# Patient Record
Sex: Male | Born: 1940
Health system: Southern US, Community
[De-identification: ages and names within clinical notes are randomized; demographics above are authoritative.]

## PROBLEM LIST (undated history)

## (undated) DIAGNOSIS — E785 Hyperlipidemia, unspecified: Secondary | ICD-10-CM

## (undated) DIAGNOSIS — M7989 Other specified soft tissue disorders: Secondary | ICD-10-CM

## (undated) DIAGNOSIS — I499 Cardiac arrhythmia, unspecified: Secondary | ICD-10-CM

## (undated) DIAGNOSIS — M1990 Unspecified osteoarthritis, unspecified site: Secondary | ICD-10-CM

## (undated) DIAGNOSIS — R21 Rash and other nonspecific skin eruption: Secondary | ICD-10-CM

## (undated) DIAGNOSIS — T8859XA Other complications of anesthesia, initial encounter: Secondary | ICD-10-CM

## (undated) DIAGNOSIS — I10 Essential (primary) hypertension: Secondary | ICD-10-CM

## (undated) DIAGNOSIS — E669 Obesity, unspecified: Secondary | ICD-10-CM

## (undated) DIAGNOSIS — T4145XA Adverse effect of unspecified anesthetic, initial encounter: Secondary | ICD-10-CM

## (undated) DIAGNOSIS — I2510 Atherosclerotic heart disease of native coronary artery without angina pectoris: Secondary | ICD-10-CM

## (undated) DIAGNOSIS — B029 Zoster without complications: Secondary | ICD-10-CM

## (undated) DIAGNOSIS — G8929 Other chronic pain: Secondary | ICD-10-CM

## (undated) DIAGNOSIS — K219 Gastro-esophageal reflux disease without esophagitis: Secondary | ICD-10-CM

## (undated) DIAGNOSIS — R569 Unspecified convulsions: Secondary | ICD-10-CM

## (undated) DIAGNOSIS — R0602 Shortness of breath: Secondary | ICD-10-CM

## (undated) HISTORY — DX: Gastro-esophageal reflux disease without esophagitis: K21.9

## (undated) HISTORY — DX: Rash and other nonspecific skin eruption: R21

## (undated) HISTORY — DX: Essential (primary) hypertension: I10

## (undated) HISTORY — PX: COLONOSCOPY: SHX174

## (undated) HISTORY — DX: Obesity, unspecified: E66.9

## (undated) HISTORY — DX: Zoster without complications: B02.9

## (undated) HISTORY — DX: Atherosclerotic heart disease of native coronary artery without angina pectoris: I25.10

## (undated) HISTORY — DX: Other specified soft tissue disorders: M79.89

## (undated) HISTORY — DX: Hyperlipidemia, unspecified: E78.5

## (undated) HISTORY — DX: Unspecified osteoarthritis, unspecified site: M19.90

## (undated) HISTORY — DX: Unspecified convulsions: R56.9

---

## 2002-02-14 ENCOUNTER — Ambulatory Visit (HOSPITAL_COMMUNITY): Admission: RE | Admit: 2002-02-14 | Discharge: 2002-02-14 | Payer: Self-pay | Admitting: Family Medicine

## 2004-03-04 ENCOUNTER — Ambulatory Visit (HOSPITAL_COMMUNITY): Admission: RE | Admit: 2004-03-04 | Discharge: 2004-03-04 | Payer: Self-pay | Admitting: General Surgery

## 2004-09-20 ENCOUNTER — Ambulatory Visit (HOSPITAL_COMMUNITY): Admission: RE | Admit: 2004-09-20 | Discharge: 2004-09-20 | Payer: Self-pay | Admitting: Cardiovascular Disease

## 2005-10-09 ENCOUNTER — Encounter: Payer: Self-pay | Admitting: Cardiovascular Disease

## 2005-11-25 ENCOUNTER — Inpatient Hospital Stay (HOSPITAL_COMMUNITY): Admission: RE | Admit: 2005-11-25 | Discharge: 2005-11-28 | Payer: Self-pay | Admitting: Specialist

## 2007-04-30 ENCOUNTER — Ambulatory Visit (HOSPITAL_COMMUNITY): Admission: RE | Admit: 2007-04-30 | Discharge: 2007-04-30 | Payer: Self-pay | Admitting: Family Medicine

## 2007-05-10 ENCOUNTER — Ambulatory Visit (HOSPITAL_COMMUNITY): Admission: RE | Admit: 2007-05-10 | Discharge: 2007-05-10 | Payer: Self-pay | Admitting: Family Medicine

## 2008-11-28 ENCOUNTER — Ambulatory Visit (HOSPITAL_COMMUNITY): Admission: RE | Admit: 2008-11-28 | Discharge: 2008-11-28 | Payer: Self-pay | Admitting: Family Medicine

## 2009-01-29 ENCOUNTER — Ambulatory Visit (HOSPITAL_COMMUNITY)
Admission: RE | Admit: 2009-01-29 | Discharge: 2009-01-29 | Payer: Self-pay | Admitting: Physical Medicine & Rehabilitation

## 2009-02-27 ENCOUNTER — Ambulatory Visit (HOSPITAL_COMMUNITY): Admission: RE | Admit: 2009-02-27 | Discharge: 2009-02-27 | Payer: Self-pay | Admitting: General Surgery

## 2009-10-22 ENCOUNTER — Ambulatory Visit (HOSPITAL_COMMUNITY): Admission: RE | Admit: 2009-10-22 | Discharge: 2009-10-22 | Payer: Self-pay | Admitting: Specialist

## 2009-11-23 ENCOUNTER — Encounter: Payer: Self-pay | Admitting: Cardiovascular Disease

## 2009-12-03 ENCOUNTER — Encounter: Payer: Self-pay | Admitting: Cardiovascular Disease

## 2010-02-08 ENCOUNTER — Ambulatory Visit: Payer: Self-pay | Admitting: Cardiovascular Disease

## 2010-02-08 ENCOUNTER — Encounter: Payer: Self-pay | Admitting: Cardiovascular Disease

## 2010-02-08 DIAGNOSIS — R0609 Other forms of dyspnea: Secondary | ICD-10-CM | POA: Insufficient documentation

## 2010-02-08 DIAGNOSIS — R9431 Abnormal electrocardiogram [ECG] [EKG]: Secondary | ICD-10-CM | POA: Insufficient documentation

## 2010-02-08 DIAGNOSIS — R0989 Other specified symptoms and signs involving the circulatory and respiratory systems: Secondary | ICD-10-CM

## 2010-02-11 ENCOUNTER — Telehealth (INDEPENDENT_AMBULATORY_CARE_PROVIDER_SITE_OTHER): Payer: Self-pay | Admitting: *Deleted

## 2010-02-27 ENCOUNTER — Telehealth (INDEPENDENT_AMBULATORY_CARE_PROVIDER_SITE_OTHER): Payer: Self-pay

## 2010-02-28 ENCOUNTER — Encounter: Payer: Self-pay | Admitting: Cardiology

## 2010-02-28 ENCOUNTER — Encounter: Payer: Self-pay | Admitting: Cardiovascular Disease

## 2010-02-28 ENCOUNTER — Encounter (HOSPITAL_COMMUNITY)
Admission: RE | Admit: 2010-02-28 | Discharge: 2010-05-14 | Payer: Self-pay | Source: Home / Self Care | Attending: Cardiovascular Disease | Admitting: Cardiovascular Disease

## 2010-02-28 ENCOUNTER — Ambulatory Visit (HOSPITAL_COMMUNITY): Admission: RE | Admit: 2010-02-28 | Discharge: 2010-02-28 | Payer: Self-pay | Admitting: Cardiovascular Disease

## 2010-02-28 ENCOUNTER — Ambulatory Visit: Payer: Self-pay | Admitting: Cardiology

## 2010-02-28 ENCOUNTER — Ambulatory Visit: Payer: Self-pay

## 2010-03-05 ENCOUNTER — Encounter: Payer: Self-pay | Admitting: Cardiovascular Disease

## 2010-03-11 ENCOUNTER — Ambulatory Visit (HOSPITAL_COMMUNITY): Admission: RE | Admit: 2010-03-11 | Discharge: 2010-03-11 | Payer: Self-pay | Admitting: Cardiovascular Disease

## 2010-03-11 ENCOUNTER — Encounter: Payer: Self-pay | Admitting: Cardiovascular Disease

## 2010-03-12 LAB — CONVERTED CEMR LAB
CO2: 27 meq/L (ref 19–32)
Calcium: 9.1 mg/dL (ref 8.4–10.5)
Creatinine, Ser: 0.91 mg/dL (ref 0.40–1.50)
Glucose, Bld: 103 mg/dL — ABNORMAL HIGH (ref 70–99)
HCT: 42.5 % (ref 39.0–52.0)
Hemoglobin: 13.7 g/dL (ref 13.0–17.0)
MCHC: 32.2 g/dL (ref 30.0–36.0)
MCV: 94.4 fL (ref 78.0–100.0)
RDW: 13.8 % (ref 11.5–15.5)

## 2010-03-14 ENCOUNTER — Ambulatory Visit: Payer: Self-pay | Admitting: Cardiovascular Disease

## 2010-03-14 ENCOUNTER — Ambulatory Visit: Admission: RE | Admit: 2010-03-14 | Payer: Self-pay | Source: Home / Self Care | Admitting: Cardiovascular Disease

## 2010-03-20 ENCOUNTER — Ambulatory Visit: Payer: Self-pay | Admitting: Cardiovascular Disease

## 2010-03-20 ENCOUNTER — Encounter: Payer: Self-pay | Admitting: Cardiovascular Disease

## 2010-03-20 DIAGNOSIS — I2510 Atherosclerotic heart disease of native coronary artery without angina pectoris: Secondary | ICD-10-CM | POA: Insufficient documentation

## 2010-05-05 ENCOUNTER — Encounter: Payer: Self-pay | Admitting: Specialist

## 2010-05-14 NOTE — Letter (Signed)
Summary: Office Notes ,Ekg, Cath Report, Myocardial Perfusion 2006-2007  Office Notes ,Ekg, Cath Report, Myocardial Perfusion 2006-2007   Imported By: Roderic Ovens 03/15/2010 10:50:09  _____________________________________________________________________  External Attachment:    Type:   Image     Comment:   External Document

## 2010-05-14 NOTE — Progress Notes (Signed)
Summary: Nuc. Pre-Procedure  Phone Note Outgoing Call Call back at [facility] Phone [phone]   Call placed by: Irean Hong, RN,  February 27, 2010 1:59 PM Summary of Call: Reviewed information on Myoview Information Sheet (see scanned document for further details).  Spoke with patient's wife.     Nuclear Med Background Indications for Stress Test: Evaluation for Ischemia, Abnormal EKG   History: GXT, Heart Catheterization  History Comments: '03 GXT: Neg. 6 years ago Cath: NL.  Symptoms: DOE    Nuclear Pre-Procedure Cardiac Risk Factors: Family History - CAD, Hypertension, Lipids, Obesity Height (in): 71

## 2010-05-14 NOTE — Assessment & Plan Note (Signed)
Summary: Steven [name]   Visit Type:  Follow-up Primary Malone Vanblarcom:  Dr Gerda Diss  CC:  Post Cath; No acute complaints.  History of Present Illness: 70 year-old gentleman presents for followup of CAD. He recently underwent cath showing moderate LAD stenosis and severe right PLA stenosis (small - moderate branch). He has no angina. The patient has no new complaints today. He has problems with his right knee 4 years after replacement and reportedly may need a revision.  Current Medications (verified): 1)  Pravastatin Sodium 40 Mg Tabs (Pravastatin Sodium) .... Take One Tablet By Mouth Daily At Bedtime 2)  Lisinopril-Hydrochlorothiazide 10-12.5 Mg Tabs (Lisinopril-Hydrochlorothiazide) .... Take 1 Tablet By Mouth Once A Day 3)  Omeprazole 20 Mg Tbec (Omeprazole) .... Take 1 Tablet By Mouth Once A Day 4)  Morphine Sulfate 15 Mg Tabs (Morphine Sulfate) .... As Needed 5)  Fish Oil 1000 Mg Caps (Omega-3 Fatty Acids) .... Take 1 Capsule By Mouth Two Times A Day 6)  Vitamin D 1000 Unit Tabs (Cholecalciferol) .... Take 1 Tablet By Mouth Once A Day 7)  Zyrtec Hives Relief 10 Mg Tabs (Cetirizine Hcl) .... Take 1 Tablet By Mouth Once A Day 8)  Aspirin 81 Mg Tbec (Aspirin) .... Take One Tablet By Mouth Daily 9)  Gabapentin 100 Mg Caps (Gabapentin) .... Take 1 By Mouth Two Times A Day  Allergies: 1)  ! Percocet  Past History:  Past medical history reviewed for relevance to current acute and chronic problems.  Past Medical History: HTN Obesity Dyslipidemia Osteoarthritis CAD - cath 2011  Review of Systems       Negative except as per HPI   Vital Signs:  Patient profile:   70 year old male Height:      70 inches Weight:      293.50 pounds BMI:     42.27 Pulse rate:   74 / minute Pulse rhythm:   regular Resp:     16 per minute BP sitting:   108 / 90  (left arm)  Vitals Entered By: Stanton Kidney, EMT-P (March 20, 2010 1:39 PM)  Physical Exam  General:  obese male, NAD,  pleasant   EKG  Procedure date:  03/20/2010  Findings:      NSR 74 bpm, within normal limits  Impression & Recommendations:  Problem # 1:  CORONARY ATHEROSCLEROSIS NATIVE CORONARY ARTERY (ICD-414.01) I reviewed the patient's catheterization films and showed him and his wife the findings. His stress test results were again reviewed in the context of his coronary anatomy. With an absence of angina, I would favor ongoing medical therapy. His PLA branch could be stented, but it does not supply a large area of myocardium. He also has the potential of knee surgery within the year, and he is probably better off without stenting before surgery. I would like to see him back in one year for followup. We had a long discussion regarding risk modification.   His lipids are followed by Dr Gerda Diss, and by the patient's report they have been well-controlled. I would consider changing to a more potent statin since his CAD has clearly progressed over the past 6 years.  His updated medication list for this problem includes:    Lisinopril-hydrochlorothiazide 10-12.5 Mg Tabs (Lisinopril-hydrochlorothiazide) .Marland Kitchen... Take 1 tablet by mouth once a day    Aspirin 81 Mg Tbec (Aspirin) .Marland Kitchen... Take one tablet by mouth daily  Orders: EKG w/ Interpretation (93000)  Patient Instructions: 1)  Your physician recommends that you continue on your current  medications as directed. Please refer to the Current Medication list given to you today. 2)  Your physician wants you to follow-up in:  1 YEAR.  You will receive a reminder letter in the mail two months in advance. If you don't receive a letter, please call our office to schedule the follow-up appointment.

## 2010-05-14 NOTE — Letter (Signed)
Summary: Cardiac Catheterization Instructions- [facility]  [employer], Main Office  [address]   [place], [place] [address]   Phone: [phone]  Fax: [phone]     03/05/2010 MRN: [id number]  Steven [name] [address] [place], [place]  [address]  Dear Mr. Steven [name], KARASIK are scheduled for a Cardiac Catheterization on Thursday March 14, 2010 with Dr. Excell Seltzer.  Please arrive to the 1st floor of the Heart and Vascular Center at [facility] at 7:30 am on the day of your procedure. Please do not arrive before 6:30 a.m. Call the Heart and Vascular Center at [phone] if you are unable to make your appointmnet. The Code to get into the parking garage under the building is 0003. Take the elevators to the 1st floor. You must have someone to drive you home. Someone must be with you for the first 24 hours after you arrive home. Please wear clothes that are easy to get on and off and wear slip-on shoes. Do not eat or drink after midnight except water with your medications that morning. Bring all your medications and current insurance cards with you.  _X__ Make sure you take your aspirin.  _X__ You may take ALL of your medications with water that morning.   Please come into the office for labwork and chest x-ray.    The usual length of stay after your procedure is 2 to 3 hours. This can vary.  If you have any questions, please call the office at the number listed above.   Julieta Gutting, RN, BSN

## 2010-05-14 NOTE — Assessment & Plan Note (Signed)
Summary: Cardiology Nuclear Testing  Nuclear Med Background Indications for Stress Test: Evaluation for Ischemia, Surgical Clearance, Abnormal EKG  Indications Comments: Pending (R) TKR by Dr. Ollen Gross  History: GXT, Heart Catheterization, Myocardial Perfusion Study  History Comments: '03 WJX:BJYNWG;  ~5 yrs ago MPS:OK per patient;  ~6 yrs ago Cath:normal  Symptoms: Chest Tightness, DOE, Fatigue  Symptoms Comments: Chest tightness with spicy food. Last episode of CP:2-3 weeks ago.   Nuclear Pre-Procedure Cardiac Risk Factors: Family History - CAD, History of Smoking, Hypertension, Lipids, Obesity Caffeine/Decaff Intake: none NPO After: 3:00 AM Lungs: Clear.  O2 Sat 98% on RA. IV 0.9% NS with Angio Cath: 18g     IV Site: R Antecubital IV Started by: Stanton Kidney, EMT-P Chest Size (in) 50     Height (in): 71 Weight (lb): 295 BMI: 41.29  Nuclear Med Study 1 or 2 day study:  1 day     Stress Test Type:  Treadmill/Lexiscan Reading MD:  Olga Millers, MD     Referring MD:  Tonny Bollman, MD Resting Radionuclide:  Technetium 28m Tetrofosmin     Resting Radionuclide Dose:  11 mCi  Stress Radionuclide:  Technetium 105m Tetrofosmin     Stress Radionuclide Dose:  33 mCi   Stress Protocol Exercise Time (min):  2:00 min     Max HR:  117 bpm     Predicted Max HR:  152 bpm  Max Systolic BP: 149 mm Hg     Percent Max HR:  76.97 %Rate Pressure Product:  95621  Lexiscan: 0.4 mg   Stress Test Technologist:  [employer], CMA-N     Nuclear Technologist:  Domenic Polite, CNMT  Rest Procedure  Myocardial perfusion imaging was performed at rest 45 minutes following the intravenous administration of Technetium 56m Tetrofosmin.  Stress Procedure  The patient received IV Lexiscan 0.4 mg over 15-seconds with concurrent low level exercise and then Technetium 47m Tetrofosmin was injected at 30-seconds.  There were no significant changes with infusion, other than occasional PAC's.  Quantitative  spect images were obtained after a 45 minute delay.  QPS Raw Data Images:  Acquisition technically good; normal left ventricular size. Stress Images:  There is decreased uptake in the anterior wall and inferolateral wall at the base. Rest Images:  There is decreased uptake in the anterior wall. Subtraction (SDS):  These findings are consistent with mild soft tissue attenuation in the anterior wall and mild to moderate ischemia in the basal inferolateral wall. Transient Ischemic Dilatation:  1.02  (Normal <1.22)  Lung/Heart Ratio:  .27  (Normal <0.45)  Quantitative Gated Spect Images QGS EDV:  100 ml QGS ESV:  33 ml QGS EF:  67 % QGS cine images:  Normal wall motion.   Overall Impression  Exercise Capacity: Lexiscan with no exercise. BP Response: Normal blood pressure response. Clinical Symptoms: No chest pain ECG Impression: No significant ST segment change suggestive of ischemia. Overall Impression: Abnormal lexiscan nuclear study with mild soft tissue attenuation in the anterior wall and mild to moderate ischemia in the basal inferolateral wall.  Appended Document: Cardiology Nuclear Testing Dr Excell Seltzer aware of Myoview results.  He would like to schedule pt for cardiac cath in [facility].  I attempted to contact the pt but no answer at home number.   Appended Document: Cardiology Nuclear Testing As above, recommend cardiac cath in setting multiple CV risk factors and inferior ischemia on stress test.

## 2010-05-14 NOTE — Assessment & Plan Note (Signed)
Summary: [facility]   Visit Type:  Initial Consult Primary Provider:  Dr Gerda Diss  CC:  abnormal EKG HTN.  History of Present Illness: 70 year-old gentleman referred for initial evaluation of an abnormal EKG. The pt's EKG at his recent annual exam showed evidence of an age-indeterminate anteroseptal MI.   The patient has longstanding obesity and knee problems, so his activity level is low. 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  He underwent cardiac cath approximately 6 years ago and reports there were no blockages. The patient's risk profile includes HTN, dyslipidemia, strong family history of CAD, and obestiy.  He complains of exertional dyspnea, but otherwise denies chest pain, orthopnea, PND, palpitations, lightheadedness, or syncope. He ocmplains of chronic right lower leg swelling but relates this to knee surgery.    Current Medications (verified): 1)  Pravastatin Sodium 40 Mg Tabs (Pravastatin Sodium) .... Take One Tablet By Mouth Daily At Bedtime 2)  Lisinopril-Hydrochlorothiazide 10-12.5 Mg Tabs (Lisinopril-Hydrochlorothiazide) .... Take 1 Tablet By Mouth Once A Day 3)  Omeprazole 20 Mg Tbec (Omeprazole) .... Take 1 Tablet By Mouth Once A Day 4)  Lyrica 75 Mg Caps (Pregabalin) .... Take 1 Tablet By Mouth Two Times A Day 5)  Morphine Sulfate 15 Mg Tabs (Morphine Sulfate) .... As Needed 6)  Fish Oil 1000 Mg Caps (Omega-3 Fatty Acids) .... Take 1 Capsule By Mouth Two Times A Day 7)  Vitamin D 1000 Unit Tabs (Cholecalciferol) .... Take 1 Tablet By Mouth Once A Day 8)  Zyrtec Hives Relief 10 Mg Tabs (Cetirizine Hcl) .... Take 1 Tablet By Mouth Once A Day 9)  Aspirin 81 Mg Tbec (Aspirin) .... Take One Tablet By Mouth Daily  Allergies (verified): 1)  ! Percocet  Past History:  Past medical, surgical, family and social histories (including risk factors) reviewed, and no changes noted (except as noted below).  Past Medical  History: HTN Obesity Dyslipidemia Osteoarthritis  Past Surgical History: Right knee replacement 2007  Family History: Reviewed history and no changes required. Father - MI in his 32's but lived into his 65's with COPD Mother - died 61 multiple med problems Sister - MI 47  Social History: Reviewed history and no changes required. Married with 3 grown children Nonsmoker, No Etoh Retired tobacco farmer  Review of Systems       Positive for joint pains, constipation, GERD, anxiety, and depression. Otherwise negative except as per HPI.  Vital Signs:  Patient profile:   70 year old male Height:      71 inches Weight:      293.8 pounds BMI:     41.12 Pulse rate:   64 / minute Pulse rhythm:   regular Resp:     18 per minute BP sitting:   104 / 80  (left arm) Cuff size:   large  Vitals Entered By: Vikki Ports (February 08, 2010 10:01 AM)  Physical Exam  General:  Pt is well-developed, alert and oriented, obese male in no acute distress HEENT: normal Neck: no thyromegaly           JVP normal, carotid upstrokes normal without bruits Lungs: CTA Chest: equal expansion  CV: Apical impulse nondisplaced, RRR without murmur or gallop Abd: soft, NT, positive BS, no HSM, no bruit Back: no CVA tenderness Ext: no clubbing, cyanosis, or edema        femoral pulses 2+ without bruits        pedal pulses 2+ and equal Skin:  warm, dry, no rash Neuro: CNII-XII intact,strength 5/5 = b/l    EKG  Procedure date:  02/08/2010  Findings:      NSR 64 bpm within normal limits  Impression & Recommendations:  Problem # 1:  ABNORMAL ELECTROCARDIOGRAM (ICD-794.31) This patient has an abnormal EKG which could signify an interval MI or may be related to lead placement. His EKG today does not suggest a prior infarct. With his strong risk profile, it is prudent to evaluate for regional wall motion with an echocardiogram and ischemic heart disease with a Lexiscan Myoview stress test. Will  followup with the patient after these studies are completed. If they are normal I would be happy to see him back on an as needed basis.  His updated medication list for this problem includes:    Lisinopril-hydrochlorothiazide 10-12.5 Mg Tabs (Lisinopril-hydrochlorothiazide) .Marland Kitchen... Take 1 tablet by mouth once a day    Aspirin 81 Mg Tbec (Aspirin) .Marland Kitchen... Take one tablet by mouth daily  Orders: Nuclear Stress Test (Nuc Stress Test) Echocardiogram (Echo) EKG w/ Interpretation (93000)  Problem # 2:  OTHER DYSPNEA AND RESPIRATORY ABNORMALITIES (ICD-786.09) Evaluate with an echo and Myoview as above. Suspect this is primarily related to obesity.  His updated medication list for this problem includes:    Lisinopril-hydrochlorothiazide 10-12.5 Mg Tabs (Lisinopril-hydrochlorothiazide) .Marland Kitchen... Take 1 tablet by mouth once a day    Aspirin 81 Mg Tbec (Aspirin) .Marland Kitchen... Take one tablet by mouth daily  Orders: Nuclear Stress Test (Nuc Stress Test) Echocardiogram (Echo) EKG w/ Interpretation (93000)  Patient Instructions: 1)  Your physician recommends that you schedule a follow-up appointment as needed.  2)  Your physician recommends that you continue on your current medications as directed. Please refer to the Current Medication list given to you today. 3)  Your physician has requested that you have an echocardiogram.  Echocardiography is a painless test that uses sound waves to create images of your heart. It provides your doctor with information about the size and shape of your heart and how well your heart's chambers and valves are working.  This procedure takes approximately one hour. There are no restrictions for this procedure. 4)  Your physician has requested that you have a Lexiscan stress myoview.  For further information please visit [email or web address]  Please follow instruction sheet, as given.

## 2010-05-14 NOTE — Progress Notes (Signed)
  ROI faxed to [facility]  [facility]  February 11, 2010 9:30 AM     Appended Document:  Records received from [facility] gave to [facility]

## 2010-05-14 NOTE — Letter (Signed)
Summary: [facility]  [facility]   Imported By: Marylou Mccoy 03/06/2010 11:52:38  _____________________________________________________________________  External Attachment:    Type:   Image     Comment:   External Document

## 2010-08-27 ENCOUNTER — Other Ambulatory Visit: Payer: Self-pay | Admitting: Family Medicine

## 2010-08-27 DIAGNOSIS — R42 Dizziness and giddiness: Secondary | ICD-10-CM

## 2010-08-27 DIAGNOSIS — R270 Ataxia, unspecified: Secondary | ICD-10-CM

## 2010-08-30 ENCOUNTER — Ambulatory Visit (HOSPITAL_COMMUNITY)
Admission: RE | Admit: 2010-08-30 | Discharge: 2010-08-30 | Disposition: A | Discharge status: Home / Self Care | Payer: Medicare Other | Source: Ambulatory Visit | Attending: Family Medicine | Admitting: Family Medicine

## 2010-08-30 DIAGNOSIS — R270 Ataxia, unspecified: Secondary | ICD-10-CM

## 2010-08-30 DIAGNOSIS — R42 Dizziness and giddiness: Secondary | ICD-10-CM | POA: Insufficient documentation

## 2010-08-30 DIAGNOSIS — R279 Unspecified lack of coordination: Secondary | ICD-10-CM | POA: Insufficient documentation

## 2010-08-30 NOTE — Discharge Summary (Signed)
NAME:  Steven [name], Steven [name] NO.:  [id number]   MEDICAL RECORD NO.:  [id number]          PATIENT TYPE:  INP   LOCATION:  1613                         FACILITY:  [facility]   PHYSICIAN:  Erasmo Leventhal, M.D.DATE OF BIRTH:  28-Jun-1940   DATE OF ADMISSION:  11/25/2005  DATE OF DISCHARGE:  11/28/2005                                 DISCHARGE SUMMARY   ADMISSION DIAGNOSIS:  End-stage osteoarthritis right knee.   DISCHARGE DIAGNOSIS:  End-stage osteoarthritis right knee.   OPERATION:  Total knee arthroplasty right knee.   BRIEF HISTORY:  This is a 70 year old gentleman with a history of end-stage  osteoarthritis of the right knee who failed conservative management with  ejection therapy and medications and now scheduled for total knee  arthroplasty of the right knee. Surgery risks, benefits and after care were  discussed in detail with the patient and surgery to go ahead as scheduled.   LABORATORY DATA:  Admission CBC within normal limits. Hemoglobin and  hematocrit reached a low 10.8 and 31.6 on the 17th. His BMET was within  normal limits with the exception of a mildly elevated glucose and sodium of  133 on the 17th. His PT and PTT within normal limits at discharge. Note that  he did have a mildly elevated BUN of 25 to 24 and then down to normal level  on the 16th. INR was 1.4 and he was started on Lovenox 40 mg subcu daily on  the day of discharge until his Coumadin is therapeutic.   HOSPITAL COURSE:  The patient tolerated the operative procedure well. The  first postoperative day vital signs were stable, he was afebrile. Intake was  a little greater than output. Hemoglobin and hematocrit were stable, glucose  was mildly elevated at 152 and he was switched to normal saline. His BUN was  mildly elevated at 24, lungs were clear, heart sounds normal, bowel sounds  active, dressing dry, calves were negative. His drain was removed without  difficulty. PT and CPM were  started. The second postoperative day, the  patient states he was doing okay, he had a little bit of right hip and leg  pain, he had a history of back problems in the past, no numbness or  tingling, no bladder or __________. There was increased pain with coughing  or sneezing. His vital signs were stable, he was afebrile. O2 93% on 2  liters, I&O was good. Hemoglobin and hematocrit were stable, BMET within  normal limits with the exception of glucose at 138. INR 1.2. Lungs clear,  heart sounds normal, bowel sounds sluggish. Dressing was changed, wound was  benign, calves were negative. He had some slight itching with Percocet so we  switched him to Vicodin and got a __________ pad for his back and switched  him to a Hep-Well. The third postoperative day he was feeling good, he  stated he wanted to go home. His vital signs were stable, he was afebrile,  hemoglobin 10.8, hematocrit 31.6. BMET with a sodium of 133, glucose 113,  calcium 8.2, BUN back to normal, INR 1.4. Lungs clear,  heart sounds normal,  bowel sounds active. The patient had a bowel movement, calves were negative,  wound was benign. Due to his INR being only 1.4, he was started on Lovenox  40 subcu daily. Had him get his first dose the day prior to discharge. He  will have physical therapy in the hospital today and then will be discharged  home for followup in the office.   CONDITION ON DISCHARGE:  Improved.   DISCHARGE MEDICATIONS:  1. Vicodin 5/325, 1-2 q.6 h. p.r.n. pain.  2. Robaxin 500, 1 p.o. q.8 h. p.r.n. spasm.  3. Trinsicon 1 b.i.d. for anemia.  4. Coumadin per pharmacy protocol.  5. Lovenox 40 mg subcu daily until Coumadin therapeutic.   DISCHARGE INSTRUCTIONS:  He is to do his home therapy, take his medication  as directed and call if problems or questions arise.      Jaquelyn Bitter. Chabon, P.A.    ______________________________  Erasmo Leventhal, M.D.    SJC/MEDQ  D:  11/28/2005  T:  11/28/2005   Job:  [id number]

## 2010-08-30 NOTE — H&P (Signed)
NAME:  Steven [name], Steven [name] NO.:  [id number]   MEDICAL RECORD NO.:  [id number]           PATIENT TYPE:   LOCATION:                                 FACILITY:   PHYSICIAN:  Dalia Heading, M.D.  DATE OF BIRTH:  September 27, 1940   DATE OF ADMISSION:  02/22/2004  DATE OF DISCHARGE:  LH                                HISTORY & PHYSICAL   CHIEF COMPLAINT:  Family history of colon carcinoma, history of colon  polyps.   HISTORY OF PRESENT ILLNESS:  The patient is a 70 year old white male who is  referred for endoscopic evaluation. He needs a colonoscopy for followup of  colon polyps and a family history of colon carcinoma. His father had colon  cancer and is still alive. No abdominal pain, weight loss, nausea, vomiting,  diarrhea, constipation, melena, or hematochezia have been noted. Last had  colonoscopy with polypectomy in 2001. A tubular adenoma was found in the  transverse colon. There is no history of hemorrhoidal disease.   PAST MEDICAL HISTORY:  Includes hypertension.   PAST SURGICAL HISTORY:  As noted above.   CURRENT MEDICATIONS:  Diovan, Aciphex, Zocor.   ALLERGIES:  No known drug allergies.   REVIEW OF SYSTEMS:  Noncontributory.   PHYSICAL EXAMINATION:  GENERAL:  A well-developed, well-nourished white male  in no acute distress.  VITAL SIGNS:  Afebrile and vital signs are stable.  LUNGS:  Clear to auscultation with equal breath sounds bilaterally.  HEART:  Examination reveals a regular rate and rhythm without S3, S4, or  murmurs.  ABDOMEN:  Soft, nontender, nondistended. No hepatosplenomegaly or masses are  noted.  RECTAL:  Examination was deferred until the procedure.   IMPRESSION:  Family history of colon carcinoma, history of colon polyps.   PLAN:  The patient is scheduled for a colonoscopy on March 04, 2004. The  risks and benefits of the procedure including bleeding and perforation were  fully explained to the patient. He gave informed  consent.     Mark   MAJ/MEDQ  D:  02/22/2004  T:  02/22/2004  Job:  [id number]   cc:   [facility]   Scott A. Gerda Diss, MD  [address], [address]  [place]  [place] [address]  Fax: [phone]

## 2010-08-30 NOTE — Procedures (Signed)
   NAME:  Steven, [name] NO.:  [id number]   MEDICAL RECORD NO.:  [id number]                   PATIENT TYPE:   LOCATION:                                       FACILITY:  [facility]   PHYSICIAN:  Scott A. Gerda Diss, M.D.               DATE OF BIRTH:  08-Nov-1940   DATE OF PROCEDURE:  02/14/2002  DATE OF DISCHARGE:                                    STRESS TEST   STRESS PROTOCOL:  Bruce protocol.   INDICATIONS:  Hypertension, atypical chest discomfort and mild dyspnea with  activity.  Does not get chest discomfort with activity though.   ELECTROCARDIOGRAM:  Resting EKG no acute ST segment changes are noted.   HEART RATE RESPONSE TO EXERCISE:  Patient's heart rate rapidly increased and  reached a peak heart rate of 145 approximately 10 seconds into stage 2.   ST SEGMENT RESPONSE:  There is no acute ST segment changes noted during the  test or during recovery.   ARRHYTHMIA:  None.   SYMPTOMATOLOGY:  Dyspnea on exertion.   BLOOD PRESSURE RESPONSE:  The patient did have a moderate hypertensive  response to exercise.   RECOVERY PHASE:  Uneventful.   INTERPRETATION:  Negative stress test, but does show that the patient is in  poor cardiovascular condition.  Will send him an exercise program; and the  patient is to gradually increase his walking until he follows up in 1 month.  If further symptoms, will need cardiology referral with more intricate  testing.                                               Scott A. Gerda Diss, M.D.    SAL/MEDQ  D:  02/14/2002  T:  02/14/2002  Job:  [id number]

## 2010-08-30 NOTE — H&P (Signed)
NAME:  Steven [name], Steven [name] NO.:  [id number]   MEDICAL RECORD NO.:  [id number]          PATIENT TYPE:   LOCATION:                                 FACILITY:   PHYSICIAN:  Erasmo Leventhal, M.D.DATE OF BIRTH:  November 15, 1940   DATE OF ADMISSION:  11/25/2005  DATE OF DISCHARGE:                                HISTORY & PHYSICAL   CHIEF COMPLAINT:  Osteoarthritis, right knee.   BRIEF HISTORY:  This is a 70 year old gentleman with a history of end-stage  osteoarthritis of his right knee.  He has failed conservative management of  injections, therapy and medications, and now is scheduled for total knee  arthroplasty of the right knee.  The surgery, risks, benefits and aftercare  were discussed in detail with the patient.  Questions were invited and  answered, and the surgery is to go ahead as scheduled.  He has medical  clearance from his medical doctor, Dr. Lilyan Punt.   PAST MEDICAL HISTORY:   DRUG ALLERGIES:  None.   CURRENT MEDICATIONS:  1.  Diovan hydrochlorothiazide 160/12.5 one p.o. daily.  2.  AcipHex 20 mg one p.o. daily.  3.  Simvastatin 20 mg one p.o. daily.  4.  Celebrex 200 mg one p.o. daily.  5.  Aspirin 81 mg one p.o. daily.  6.  Allegra 180 mg one p.o. daily.  7.  Meclizine 25 mg p.o. q.6h. p.r.n.  8.  Citrucel daily.   PREVIOUS SURGERIES:  1.  Arthroscopic knee surgery.  2.  Heart catheterization.   SERIOUS MEDICAL ILLNESSES:  1.  Hypertension.  2.  Hyperlipidemia.  3.  Gastroesophageal reflux disease.   FAMILY HISTORY:  Positive for coronary artery disease, hypertension, and  osteoarthritis.   SOCIAL HISTORY:  The patient is married.  He is a retired Visual merchandiser.  He does  not smoke or drink.   REVIEW OF SYSTEMS:  CENTRAL NERVOUS SYSTEM:  Negative for blurry vision or  dizziness.  PULMONARY:  Positive for exertional shortness of breath.  Negative for PND and orthopnea.  CARDIOVASCULAR:  Negative for chest pain or  palpitations.  GI:   Positive for reflux.  Negative for ulcers or hepatitis.  GU:  Positive for urinary frequency.  Otherwise, negative.  MUSCULOSKELETAL:  Positive as in HPI.   PHYSICAL EXAMINATION:  VITAL SIGNS:  Blood pressure 118/88, respirations 20,  pulse 64 and regular.  GENERAL APPEARANCE:  This is a well-developed, well-nourished obese  gentleman in no acute distress.  HEENT:  Head normocephalic.  Nose patent, nares patent.  Pupils equal, round  and reactive to light.  Throat without injection.  NECK:  Supple without adenopathy.  Carotids 2+ without bruits.  CHEST:  Clear to auscultation.  No rales or rhonchi.  Respirations 20.  HEART:  Regular rate and rhythm at 64 beats per minute without murmur.  ABDOMEN:  Soft with active bowel sounds.  No mass or organomegaly.  NEUROLOGIC:  The patient is alert and oriented to time, place and person.  Cranial nerves II-XII grossly intact.  EXTREMITIES:  The right knee with 5 degree flexion contraction with  further  flexion to 130 degrees.  Neurovascular status is intact to the leg.  Dorsalis pedis and posterior tibialis pulses are 1+.   X-rays show end-stage osteoarthritis of the right knee.   IMPRESSION:  End-stage osteoarthritis, right knee.   PLAN:  Total knee arthroplasty, right knee.      Jaquelyn Bitter. Chabon, P.A.    ______________________________  Erasmo Leventhal, M.D.    SJC/MEDQ  D:  11/03/2005  T:  11/03/2005  Job:  [id number]

## 2010-08-30 NOTE — Op Note (Signed)
NAME:  Steven [name], Steven [name] NO.:  [id number]   MEDICAL RECORD NO.:  [id number]          PATIENT TYPE:  INP   LOCATION:  0002                         FACILITY:  [facility]   PHYSICIAN:  Erasmo Leventhal, M.D.DATE OF BIRTH:  1940-10-03   DATE OF PROCEDURE:  11/25/2005  DATE OF DISCHARGE:                                 OPERATIVE REPORT   PREOPERATIVE DIAGNOSIS:  Right knee end stage osteoarthritis.   POSTOPERATIVE DIAGNOSIS:  Right knee end stage osteoarthritis.   OPERATION PERFORMED:  Right total knee arthroplasty.   SURGEON:  Erasmo Leventhal, M.D.   ASSISTANT:  Jaquelyn Bitter. Chabon, P.A.   ANESTHESIA:  General with femoral nerve block Dr. Shireen Quan.   ESTIMATED BLOOD LOSS:  Less than 100 mL.   DRAINS:  2 medium Hemovacs.   COMPLICATIONS:  None.   TOURNIQUET TIME:  One hour and 20 minutes at 300 mmHg.   DISPOSITION:  To PACU stable.   OPERATIVE IMPLANT:  [employer], size 5  femur, size 5 tibia, 12.5 mm rotating platform tibial insert and a 38  patella all cemented.   DESCRIPTION OF PROCEDURE:  The patient was counseled in the holding area.  Correct side was identified.  IV started, antibiotics given and correct side  was marked.  The patient was taken to the operating room and was placed  under general anesthesia.  Foley catheter was placed utilizing sterile  technique by the OR circulating nurse.  All extremities well padded and the  right knee had a 5 degree flexion contracture, flexed 120 degrees.  Elevated, prepped with DuraPrep and draped in sterile fashion.  Exsanguinated with Esmarch.  Tourniquet inflated to 300 mmHg.  Straight  midline incision made through skin and subcutaneous tissue.  Medial and  lateral soft tissue flaps were developed.  Medial parapatellar arthrotomy  was performed, proximally soft tissue release was done.  Knee was flexed.  End stage arthritis, bone-against-bone.  Cruciate ligaments  were reflected,  starter hole made distal femur, canal irrigated until fluid was clear.  Intramedullary rod was gently placed and shows a 5 degree valgus, took 11 mm  cut off the distal femur.  Distal femur found to be size 5.  Rotation marks  made.  Cutting block was applied for a #5.  Medial and lateral menisci  removed, the adjacent vessels were coagulated.  Posterior neurovascular  structures were thought of and protected throughout the entire case.  Tibial  eminence was resected.  Proximal tibia found to be a size #5.  Central hole  was made.  Starter reamer was utilized __________  and irrigated.  Intramedullary rod was gently placed.  I chose a 0 degree slope with a 10 mm  cut based upon the lateral side.  Posteromedial, posterolateral femoral  osteophytes removed under direct visualization.  Flexion blocks were  applied.  10 mm was fine in flexion and extension.  Tibial base plate was  applied.  Rotation was set.  Coverage was set, reamed and punched.  Tibial  box cut was then created in standard fashion.  At this time a size 5 femur,  size 5 tibia with a 12.5 tibial insert with excellent range of motion and  soft tissue balance.  Patella was found to be a size 38, it was cut to fit a  size 38.  Locking hole was made.  Patellofemoral tracking wasanatomic.  All  trials were then removed.  Knee was irrigated with pulsatile lavage.  Cement  was mixed utilizing modern cement technique.  All components were then  cemented in place, size 5 tibia, size 5 femur.  Instruments removed and  patella size 38.  After cement had cured, excess cement was removed.  The  knee was irrigated again.  a 12.5 insert trial with excellent flexion and  extension gaps, varus and valgus stress.  Patella found to track  anatomically.  Trial was removed.  Knee was irrigated again and finally a  12.5 tibial rotation platform posterior stabilized tibial insert was  implanted.  Knee was then checked again.   Excellent rotation, balancing,  alignment.  Two Hemovac drains were placed.  Sequential closures were done.  Arthrotomy Vicryl, subcu Vicryl, skin closed with subcu Monocryl suture.  Stretch dressing was applied.  Sterile dressing was applied, tourniquet was  deflated.  Normal circulation to foot and ankle at end of case.  Drains  hooked to suction.  Femoral nerve block was then applied by Dr. Shireen Quan.  Patient was awakened.  Sponge and needle counts were correct.  No  complications or problems.   To help with surgical technique, Mr. Brett Canales Chabon's assistance was needed  throughout the entire case.           ______________________________  Erasmo Leventhal, M.D.     RAC/MEDQ  D:  11/25/2005  T:  11/25/2005  Job:  [id number]

## 2010-11-27 ENCOUNTER — Telehealth: Payer: Self-pay | Admitting: Cardiovascular Disease

## 2010-11-27 NOTE — Telephone Encounter (Signed)
Per pt wife calling pt needs to have knee surgery in November and pt needs surgical clearance. Pt PCP recommended that pt get surgical clearance from pt cardiologists instead of PCP.  Please return pt wife call to advise/discuss.

## 2010-11-27 NOTE — Telephone Encounter (Signed)
SPOKE WITH WIFE, PT IS SCHEDULED  FOR KNEE SURGERY ON Mar 05 2011  WITH DR Lequita Halt  . WIFE AWARE WILL FORWARD MESSAGE TO DR Excell Seltzer  FOR REVIEW/CY

## 2011-01-27 ENCOUNTER — Encounter: Payer: Self-pay | Admitting: Cardiovascular Disease

## 2011-01-28 ENCOUNTER — Encounter: Payer: Self-pay | Admitting: Cardiovascular Disease

## 2011-01-28 ENCOUNTER — Ambulatory Visit (INDEPENDENT_AMBULATORY_CARE_PROVIDER_SITE_OTHER): Payer: Medicare Other | Admitting: Cardiovascular Disease

## 2011-01-28 VITALS — BP 124/80 | HR 62 | Resp 18 | Ht 70.0 in | Wt 289.0 lb

## 2011-01-28 DIAGNOSIS — I2510 Atherosclerotic heart disease of native coronary artery without angina pectoris: Secondary | ICD-10-CM

## 2011-01-28 NOTE — Patient Instructions (Signed)
Your physician wants you to follow-up in: 1 YEAR.  You will receive a reminder letter in the mail two months in advance. If you don't receive a letter, please call our office to schedule the follow-up appointment.  Your physician recommends that you continue on your current medications as directed. Please refer to the Current Medication list given to you today.  

## 2011-02-12 ENCOUNTER — Encounter: Payer: Self-pay | Admitting: Cardiovascular Disease

## 2011-02-12 NOTE — Assessment & Plan Note (Signed)
The patient's cardiac catheterization and nuclear stress test results were both reviewed. As above he has no cardiac symptoms. His overall risk of cardiovascular morbidity with surgery as mildly increased in the setting of CAD and an abnormal stress test. However, he primarily has branch vessel disease and he will continue with medical treatment. I don't think any further cardiac testing is necessary and he can proceed with surgery without further medical interventions or revascularization.  If he develops any cardiac problems at the time of his surgery I would be happy to see him in consultation. He has good control of his cardiac risk factors at the present time.

## 2011-02-12 NOTE — Progress Notes (Signed)
HPI:  Steven [name] is a 70 year old gentleman presenting for preoperative cardiovascular evaluation. He will require bilateral knee surgeries. The patient has end-stage arthritis.  From a cardiac standpoint, he denies chest pain or pressure. He has chronic dyspnea with exertion but he is primarily limited by knee pain. He denies orthopnea, PND, palpitations, lightheadedness, or syncope.  The patient underwent a stress Myoview scan about one year ago demonstrating inferolateral ischemia. Cardiac catheterization showed branch vessel stenosis of the right posterolateral branch. He was treated medically because of small vessel caliber. He has done well with this and has no cardiovascular symptoms at present.  Outpatient Encounter Prescriptions as of 01/28/2011  Medication Sig Dispense Refill  . aspirin 81 MG tablet Take 81 mg by mouth daily.        . cetirizine (ZYRTEC) 10 MG tablet Take 10 mg by mouth daily.        . cholecalciferol (VITAMIN D) 1000 UNITS tablet Take 1,000 Units by mouth daily.        Marland Kitchen lisinopril-hydrochlorothiazide (PRINZIDE,ZESTORETIC) 10-12.5 MG per tablet Take 1 tablet by mouth daily.        . Methylcellulose, Laxative, (CITRUCEL) 500 MG TABS Take 1 tablet by mouth daily.        Marland Kitchen morphine (MSIR) 15 MG tablet Take 15 mg by mouth every 4 (four) hours as needed.        . Omega-3 Fatty Acids (FISH OIL) 1000 MG CAPS Take 1 capsule by mouth 2 (two) times daily.        Marland Kitchen omeprazole (PRILOSEC) 20 MG capsule Take 20 mg by mouth daily.        Marland Kitchen senna (SENOKOT) 8.6 MG tablet Take 1 tablet by mouth 2 (two) times daily.        . simvastatin (ZOCOR) 40 MG tablet Take 40 mg by mouth at bedtime.        Marland Kitchen DISCONTD: gabapentin (NEURONTIN) 100 MG capsule Take 100 mg by mouth 2 (two) times daily.        Marland Kitchen DISCONTD: pravastatin (PRAVACHOL) 40 MG tablet Take 40 mg by mouth daily.          Allergies  Allergen Reactions  . Oxycodone-Acetaminophen     Past Medical History  Diagnosis Date  .  Hypertension   . Obesity   . Dyslipidemia   . Osteoarthritis   . Coronary artery disease     ROS: Negative except as per HPI  BP 124/80  Pulse 62  Resp 18  Ht 5\' 10"  (1.778 m)  Wt 289 lb (131.09 kg)  BMI 41.47 kg/m2  PHYSICAL EXAM: Pt is alert and oriented, obese male in NAD HEENT: normal Neck: JVP - normal, carotids 2+= without bruits Lungs: CTA bilaterally CV: RRR without murmur or gallop Abd: soft, NT, Positive BS, no hepatomegaly Ext: no C/C/E, distal pulses intact and equal Skin: warm/dry no rash  EKG:  Normal sinus rhythm 62 beats per minute, within normal limits.  ASSESSMENT AND PLAN:

## 2011-02-20 ENCOUNTER — Encounter (HOSPITAL_COMMUNITY): Payer: Self-pay | Admitting: Pharmacy Technician

## 2011-02-23 ENCOUNTER — Other Ambulatory Visit: Payer: Self-pay | Admitting: Orthopedic Surgery

## 2011-02-23 MED ORDER — LIDOCAINE HCL (PF) 1 % IJ SOLN: 5.0000 mL | Freq: Once | INTRAMUSCULAR | Status: DC

## 2011-02-23 MED ORDER — METHYLPREDNISOLONE ACETATE 40 MG/ML IJ SUSP: 80.0000 mg | Freq: Once | INTRAMUSCULAR | Status: DC

## 2011-02-23 NOTE — H&P (Signed)
Steven [name] DOB: [birth year]  Date Of Admission:  03/05/2011  Chief Complaint: Bilateral Knee Pain  History of Present Illness The patient is a 70 year old male who comes in today for a preoperative History and Physical. The patient is scheduled for a right total knee arthroplasty to be performed by Dr. Gus Rankin. Aluisio, MD at [facility] on 03/05/2011.  The patient will also receive a left knee cortisone injection at the time of surgery.  Allergies: None  Intolerance:  Percocet - makes the patient "wild"  Medical History Chronic Pain Coronary artery disease Gastroesophageal Reflux Disease High blood pressure Hypercholesterolemia Osteoarthritis Impaired Vision. cataracts Impaired Hearing. uses hearing aides Shingles Hemorrhoids Urinary Incontinence. mild Obesity  Past Surgical History Total Knee Replacement. right Cardia Cath x2 Right Eye Laser Surgery. Repair retina  Family History Cancer. grandmother mothers side and grandmother fathers side Chronic Obstructive Lung Disease. father Congestive Heart Failure. mother Heart Disease. father, sister and grandfather mothers side Heart disease in male family member before age 85 Osteoarthritis. mother and father  Social History Alcohol use. never consumed alcohol Children. 3 Current work status. retired Financial planner (Currently). no Drug/Alcohol Rehab (Previously). no Exercise. Exercises weekly; does running / walking Illicit drug use. no Living situation. live with spouse Marital status. married Number of flights of stairs before winded. 2-3 Pain Contract. yes Tobacco / smoke exposure. no Tobacco use. Former smoker. never smoker Post-Surgical Plans. Plan for home Advance Directives. Living will, Healthcare power of attorney  Review of Systems: General:Present- Feeling sick. Not Present- Chills, Fever, Night Sweats, Appetite Loss, Fatigue, Weight Gain and  Weight Loss. Skin:Present- Itching and Rash. Not Present- Skin Color Changes, Ulcer, Psoriasis and Change in Hair or Nails. HEENT:Not Present- Sensitivity to light, Hearing problems, Nose Bleed and Ringing in the Ears. Neck:Not Present- Swollen Glands and Neck Mass. Respiratory:Not Present- Snoring, Chronic Cough, Bloody sputum and Dyspnea. Cardiovascular:Not Present- Shortness of Breath, Chest Pain, Swelling of Extremities, Leg Cramps and Palpitations. Gastrointestinal:Not Present- Bloody Stool, Heartburn, Abdominal Pain, Vomiting, Nausea and Incontinence of Stool. Male Genitourinary:Not Present- Blood in Urine, Frequency, Incontinence and Nocturia. Musculoskeletal:Present- Joint Stiffness and Joint Swelling. Not Present- Muscle Weakness, Muscle Pain, Joint Pain and Back Pain. Neurological:Not Present- Tingling, Numbness, Burning, Tremor, Headaches and Dizziness. Psychiatric:Not Present- Anxiety, Depression and Memory Loss. Endocrine:Not Present- Cold Intolerance, Heat Intolerance, Excessive hunger and Excessive Thirst. Hematology:Not Present- Abnormal Bleeding, Anemia, Blood Clots and Easy Bruising.   Vitals: Weight: 280 lb Height: 70.5 in Weight was reported by patient. Height was reported by patient. Body Surface Area: 2.51 m Body Mass Index: 39.61 kg/m Pulse: 64 (Regular) Resp.: 16 (Unlabored) BP: 128/80 (Sitting, Right Arm, Standard)  EXAM GENERAL: Patient is a 70 y.o. male, well-nourished, well-developed, no acute distress. Alert, oriented, cooperative. Overweight, obese. Good historian. Accompanied by his wife. HENT:  Normocephalic, atraumatic. Pupils round and reactive. EOMs intact. NECK:  Supple, no bruits. CHEST:  Clear to anterior and posterior chest walls. No rhonchi, rales, wheezes. HEART:  Regular, rate and rhythm. Faint systolic ejection murmur.  S1 and S2 noted. ABDOMEN:  Soft, nontender, protuberant abdomen, bowel sounds  present. RECTAL/BREAST/GENITALIA:  Not done, not pertinent to present illness. EXTREMITIES:  Right knee - positive effusion, ROM 0-120, some instability.   Assessment & Plan Loose right total knee arthroplasty (996.41) Osteoarthrosis  Left Knee  Plan is for the patient to undergo a revision right total knee arthroplasty and a left knee cortisone injection. Risks and benefits of the surgery have been discussed  with the patient and they elect to proceed with surgery.  There are on active contraindications to upcoming procedure such as ongoing infection or progressive neurological disease.

## 2011-02-27 ENCOUNTER — Encounter (HOSPITAL_COMMUNITY): Payer: Medicare Other

## 2011-02-27 ENCOUNTER — Encounter (HOSPITAL_COMMUNITY): Payer: Self-pay

## 2011-02-27 DIAGNOSIS — B029 Zoster without complications: Secondary | ICD-10-CM

## 2011-02-27 DIAGNOSIS — K219 Gastro-esophageal reflux disease without esophagitis: Secondary | ICD-10-CM

## 2011-02-27 DIAGNOSIS — I2510 Atherosclerotic heart disease of native coronary artery without angina pectoris: Secondary | ICD-10-CM

## 2011-02-27 DIAGNOSIS — M1990 Unspecified osteoarthritis, unspecified site: Secondary | ICD-10-CM

## 2011-02-27 HISTORY — DX: Unspecified osteoarthritis, unspecified site: M19.90

## 2011-02-27 HISTORY — PX: REPLACEMENT TOTAL KNEE: SUR1224

## 2011-02-27 HISTORY — DX: Atherosclerotic heart disease of native coronary artery without angina pectoris: I25.10

## 2011-02-27 HISTORY — DX: Zoster without complications: B02.9

## 2011-02-27 HISTORY — DX: Gastro-esophageal reflux disease without esophagitis: K21.9

## 2011-02-27 LAB — URINALYSIS, ROUTINE W REFLEX MICROSCOPIC
Hgb urine dipstick: NEGATIVE
Specific Gravity, Urine: 1.013 (ref 1.005–1.030)
Urobilinogen, UA: 0.2 mg/dL (ref 0.0–1.0)

## 2011-02-27 LAB — SURGICAL PCR SCREEN
MRSA, PCR: NEGATIVE
Staphylococcus aureus: NEGATIVE

## 2011-02-27 LAB — COMPREHENSIVE METABOLIC PANEL
ALT: 13 U/L (ref 0–53)
AST: 17 U/L (ref 0–37)
Albumin: 3.6 g/dL (ref 3.5–5.2)
Alkaline Phosphatase: 78 U/L (ref 39–117)
Potassium: 4.4 mEq/L (ref 3.5–5.1)
Sodium: 136 mEq/L (ref 135–145)
Total Protein: 6.9 g/dL (ref 6.0–8.3)

## 2011-02-27 LAB — CBC
MCHC: 33.5 g/dL (ref 30.0–36.0)
Platelets: 216 10*3/uL (ref 150–400)
RDW: 13.7 % (ref 11.5–15.5)

## 2011-02-27 LAB — APTT: aPTT: 35 seconds (ref 24–37)

## 2011-02-27 LAB — PROTIME-INR: Prothrombin Time: 13.5 seconds (ref 11.6–15.2)

## 2011-02-27 MED ORDER — CHLORHEXIDINE GLUCONATE 4 % EX LIQD: 60.0000 mL | Freq: Once | CUTANEOUS | Status: DC

## 2011-02-27 NOTE — Patient Instructions (Addendum)
[name]  02/27/2011   Your procedure is scheduled on:  03-05-11  Report to [facility] at 0630 AM.  Call this number if you have problems the morning of surgery: [phone]   Remember:   Do not eat food:After Midnight.  Do not drink clear liquids: After Midnight.  Take these medicines the morning of surgery with A SIP OF WATER: Zyrtec, Morphine, Omeprazole.  Do not wear jewelry, make-up or nail polish.  Do not wear lotions, powders, or perfumes. You may wear deodorant.  Do not shave 48 hours prior to surgery.  Do not bring valuables to the hospital.  Contacts, dentures or bridgework may not be worn into surgery.  Leave suitcase in the car. After surgery it may be brought to your room.  For patients admitted to the hospital, checkout time is 11:00 AM the day of discharge.   Patients discharged the day of surgery will not be allowed to drive home.  Name and phone number of your driver: Steven [name] [phone]/ cell [phone]  Special Instructions: CHG Shower Use Special Wash: 1/2 bottle night before surgery and 1/2 bottle morning of surgery.   Please read over the following fact sheets that you were given: Blood Transfusion Information and MRSA Information, Instructions for Incentive Spirometry.

## 2011-02-27 NOTE — Pre-Procedure Instructions (Signed)
02-27-11 Ekg(01-28-11), CXR(03-11-10), Cardiac cath report (12'11)-Dr. Excell Seltzer, Medical clearance 12-23-10 Dr. Gerda Diss with chart.

## 2011-03-04 ENCOUNTER — Encounter (HOSPITAL_COMMUNITY): Payer: Self-pay | Admitting: Orthopedic Surgery

## 2011-03-04 MED ORDER — BUPIVACAINE 0.25 % ON-Q PUMP SINGLE CATH 300ML
300.0000 mL | INJECTION | Status: DC
  Filled 2011-03-04: qty 300

## 2011-03-05 ENCOUNTER — Encounter (HOSPITAL_COMMUNITY): Payer: Self-pay | Admitting: Anesthesiology

## 2011-03-05 ENCOUNTER — Encounter (HOSPITAL_COMMUNITY): Payer: Self-pay | Admitting: *Deleted

## 2011-03-05 ENCOUNTER — Inpatient Hospital Stay (HOSPITAL_COMMUNITY): Payer: Medicare Other | Admitting: Anesthesiology

## 2011-03-05 ENCOUNTER — Encounter (HOSPITAL_COMMUNITY)
Admission: RE | Disposition: A | Discharge status: Home Health | Payer: Self-pay | Source: Ambulatory Visit | Attending: Orthopedic Surgery

## 2011-03-05 ENCOUNTER — Inpatient Hospital Stay (HOSPITAL_COMMUNITY)
Admission: RE | Admit: 2011-03-05 | Discharge: 2011-03-08 | DRG: 468 | Disposition: A | Discharge status: Home Health | Payer: Medicare Other | Source: Ambulatory Visit | Attending: Orthopedic Surgery | Admitting: Orthopedic Surgery

## 2011-03-05 DIAGNOSIS — Z96659 Presence of unspecified artificial knee joint: Secondary | ICD-10-CM

## 2011-03-05 DIAGNOSIS — Y838 Other surgical procedures as the cause of abnormal reaction of the patient, or of later complication, without mention of misadventure at the time of the procedure: Secondary | ICD-10-CM | POA: Diagnosis present

## 2011-03-05 DIAGNOSIS — H9190 Unspecified hearing loss, unspecified ear: Secondary | ICD-10-CM | POA: Diagnosis present

## 2011-03-05 DIAGNOSIS — I10 Essential (primary) hypertension: Secondary | ICD-10-CM | POA: Diagnosis present

## 2011-03-05 DIAGNOSIS — M1710 Unilateral primary osteoarthritis, unspecified knee: Secondary | ICD-10-CM

## 2011-03-05 DIAGNOSIS — K219 Gastro-esophageal reflux disease without esophagitis: Secondary | ICD-10-CM | POA: Diagnosis present

## 2011-03-05 DIAGNOSIS — Z01812 Encounter for preprocedural laboratory examination: Secondary | ICD-10-CM

## 2011-03-05 DIAGNOSIS — I2510 Atherosclerotic heart disease of native coronary artery without angina pectoris: Secondary | ICD-10-CM | POA: Diagnosis present

## 2011-03-05 DIAGNOSIS — T84029A Dislocation of unspecified internal joint prosthesis, initial encounter: Principal | ICD-10-CM | POA: Diagnosis present

## 2011-03-05 HISTORY — PX: INJECTION KNEE: SHX2446

## 2011-03-05 HISTORY — PX: TOTAL KNEE REVISION: SHX996

## 2011-03-05 LAB — TYPE AND SCREEN

## 2011-03-05 SURGERY — TOTAL KNEE REVISION
Anesthesia: Spinal | Site: Knee | Laterality: Right | Wound class: Clean

## 2011-03-05 MED ORDER — FENTANYL CITRATE 0.05 MG/ML IJ SOLN: 25.0000 ug | INTRAMUSCULAR | Status: DC | PRN

## 2011-03-05 MED ORDER — PANTOPRAZOLE SODIUM 40 MG PO TBEC
40.0000 mg | DELAYED_RELEASE_TABLET | Freq: Every day | ORAL | Status: DC
  Administered 2011-03-05 – 2011-03-07 (×3): 40 mg via ORAL
  Filled 2011-03-05 (×4): qty 1

## 2011-03-05 MED ORDER — ACETAMINOPHEN 10 MG/ML IV SOLN
1000.0000 mg | Freq: Four times a day (QID) | INTRAVENOUS | Status: AC
  Administered 2011-03-05 – 2011-03-06 (×3): 1000 mg via INTRAVENOUS
  Filled 2011-03-05 (×6): qty 100

## 2011-03-05 MED ORDER — ACETAMINOPHEN 325 MG PO TABS: 650.0000 mg | ORAL_TABLET | Freq: Four times a day (QID) | ORAL | Status: DC | PRN

## 2011-03-05 MED ORDER — TEMAZEPAM 15 MG PO CAPS
15.0000 mg | ORAL_CAPSULE | Freq: Every evening | ORAL | Status: DC | PRN
  Administered 2011-03-06: 15 mg via ORAL
  Filled 2011-03-05: qty 1

## 2011-03-05 MED ORDER — BISACODYL 10 MG RE SUPP: 10.0000 mg | Freq: Every day | RECTAL | Status: DC | PRN

## 2011-03-05 MED ORDER — PHENOL 1.4 % MT LIQD: 1.0000 | OROMUCOSAL | Status: DC | PRN

## 2011-03-05 MED ORDER — CEFAZOLIN SODIUM-DEXTROSE 2-3 GM-% IV SOLR: 2.0000 g | INTRAVENOUS | Status: DC

## 2011-03-05 MED ORDER — METHYLPREDNISOLONE ACETATE PF 80 MG/ML IJ SUSP
INTRAMUSCULAR | Status: DC | PRN
  Administered 2011-03-05: 80 mg via INTRA_ARTICULAR

## 2011-03-05 MED ORDER — DEXTROSE-NACL 5-0.9 % IV SOLN
INTRAVENOUS | Status: DC
  Administered 2011-03-05 – 2011-03-06 (×3): via INTRAVENOUS

## 2011-03-05 MED ORDER — ONDANSETRON HCL 4 MG/2ML IJ SOLN: 4.0000 mg | Freq: Four times a day (QID) | INTRAMUSCULAR | Status: DC | PRN

## 2011-03-05 MED ORDER — SODIUM CHLORIDE 0.9 % IR SOLN
Status: DC | PRN
  Administered 2011-03-05: 1000 mL

## 2011-03-05 MED ORDER — METOCLOPRAMIDE HCL 5 MG/ML IJ SOLN: 5.0000 mg | Freq: Three times a day (TID) | INTRAMUSCULAR | Status: DC | PRN

## 2011-03-05 MED ORDER — BISACODYL 5 MG PO TBEC: 10.0000 mg | DELAYED_RELEASE_TABLET | Freq: Every day | ORAL | Status: DC | PRN

## 2011-03-05 MED ORDER — ACETAMINOPHEN 10 MG/ML IV SOLN: 1000.0000 mg | Freq: Four times a day (QID) | INTRAVENOUS | Status: DC

## 2011-03-05 MED ORDER — DOCUSATE SODIUM 100 MG PO CAPS
100.0000 mg | ORAL_CAPSULE | Freq: Two times a day (BID) | ORAL | Status: DC
  Administered 2011-03-05 – 2011-03-06 (×2): 100 mg via ORAL
  Filled 2011-03-05 (×7): qty 1

## 2011-03-05 MED ORDER — BUPIVACAINE HCL 0.75 % IJ SOLN
INTRAMUSCULAR | Status: DC | PRN
  Administered 2011-03-05: 2 mL via INTRATHECAL

## 2011-03-05 MED ORDER — CEFAZOLIN SODIUM 1-5 GM-% IV SOLN
INTRAVENOUS | Status: DC | PRN
  Administered 2011-03-05: 2 g via INTRAVENOUS

## 2011-03-05 MED ORDER — MIDAZOLAM HCL 5 MG/5ML IJ SOLN
INTRAMUSCULAR | Status: DC | PRN
  Administered 2011-03-05 (×4): 1 mg via INTRAVENOUS

## 2011-03-05 MED ORDER — HYDROMORPHONE HCL 2 MG PO TABS
2.0000 mg | ORAL_TABLET | ORAL | Status: DC | PRN
  Administered 2011-03-05: 2 mg via ORAL
  Administered 2011-03-06 – 2011-03-08 (×13): 4 mg via ORAL
  Filled 2011-03-05 (×4): qty 2
  Filled 2011-03-05: qty 1
  Filled 2011-03-05 (×10): qty 2

## 2011-03-05 MED ORDER — HYDROMORPHONE 0.3 MG/ML IV SOLN
INTRAVENOUS | Status: DC
  Administered 2011-03-05: 3.19 mg via INTRAVENOUS
  Administered 2011-03-05 (×2): 7.5 mg via INTRAVENOUS
  Administered 2011-03-05: 3.39 mg via INTRAVENOUS
  Administered 2011-03-06: 1.59 mg via INTRAVENOUS
  Administered 2011-03-06: 1.19 mg via INTRAVENOUS
  Filled 2011-03-05 (×12): qty 25

## 2011-03-05 MED ORDER — DEXAMETHASONE SODIUM PHOSPHATE 4 MG/ML IJ SOLN
INTRAMUSCULAR | Status: DC | PRN
  Administered 2011-03-05: 10 mg via INTRAVENOUS

## 2011-03-05 MED ORDER — SIMVASTATIN 20 MG PO TABS
20.0000 mg | ORAL_TABLET | Freq: Every day | ORAL | Status: DC
  Administered 2011-03-05 – 2011-03-07 (×3): 20 mg via ORAL
  Filled 2011-03-05 (×4): qty 1

## 2011-03-05 MED ORDER — NALOXONE HCL 0.4 MG/ML IJ SOLN: 0.4000 mg | INTRAMUSCULAR | Status: DC | PRN

## 2011-03-05 MED ORDER — METOCLOPRAMIDE HCL 10 MG PO TABS: 5.0000 mg | ORAL_TABLET | Freq: Three times a day (TID) | ORAL | Status: DC | PRN

## 2011-03-05 MED ORDER — FLEET ENEMA 7-19 GM/118ML RE ENEM: 1.0000 | ENEMA | Freq: Every day | RECTAL | Status: DC | PRN

## 2011-03-05 MED ORDER — RIVAROXABAN 10 MG PO TABS
10.0000 mg | ORAL_TABLET | ORAL | Status: DC
  Administered 2011-03-06 – 2011-03-08 (×3): 10 mg via ORAL
  Filled 2011-03-05 (×3): qty 1

## 2011-03-05 MED ORDER — LORATADINE 10 MG PO TABS
10.0000 mg | ORAL_TABLET | Freq: Every day | ORAL | Status: DC
  Administered 2011-03-05 – 2011-03-08 (×4): 10 mg via ORAL
  Filled 2011-03-05 (×4): qty 1

## 2011-03-05 MED ORDER — ACETAMINOPHEN 10 MG/ML IV SOLN
1000.0000 mg | INTRAVENOUS | Status: DC
  Filled 2011-03-05: qty 100

## 2011-03-05 MED ORDER — CEFAZOLIN SODIUM 1-5 GM-% IV SOLN
1.0000 g | Freq: Four times a day (QID) | INTRAVENOUS | Status: AC
  Administered 2011-03-05 – 2011-03-06 (×3): 1 g via INTRAVENOUS
  Filled 2011-03-05 (×5): qty 50

## 2011-03-05 MED ORDER — BUPIVACAINE ON-Q PAIN PUMP (FOR ORDER SET NO CHG)
INJECTION | Status: DC
  Filled 2011-03-05: qty 1

## 2011-03-05 MED ORDER — EPHEDRINE SULFATE 50 MG/ML IJ SOLN
INTRAMUSCULAR | Status: DC | PRN
  Administered 2011-03-05 (×3): 5 mg via INTRAVENOUS

## 2011-03-05 MED ORDER — POLYETHYLENE GLYCOL 3350 17 G PO PACK: 17.0000 g | PACK | Freq: Every day | ORAL | Status: DC | PRN

## 2011-03-05 MED ORDER — PHENYLEPHRINE HCL 10 MG/ML IJ SOLN
INTRAMUSCULAR | Status: DC | PRN
  Administered 2011-03-05 (×2): 20 ug via INTRAVENOUS

## 2011-03-05 MED ORDER — METHYLCELLULOSE (LAXATIVE) 500 MG PO TABS: 1.0000 | ORAL_TABLET | Freq: Every day | ORAL | Status: DC

## 2011-03-05 MED ORDER — DIPHENHYDRAMINE HCL 12.5 MG/5ML PO ELIX
12.5000 mg | ORAL_SOLUTION | Freq: Four times a day (QID) | ORAL | Status: DC | PRN
  Filled 2011-03-05: qty 5

## 2011-03-05 MED ORDER — SODIUM CHLORIDE 0.9 % IR SOLN
Status: DC | PRN
  Administered 2011-03-05: 3000 mL

## 2011-03-05 MED ORDER — DIPHENHYDRAMINE HCL 12.5 MG/5ML PO ELIX: 12.5000 mg | ORAL_SOLUTION | ORAL | Status: DC | PRN

## 2011-03-05 MED ORDER — ONDANSETRON HCL 4 MG/2ML IJ SOLN
4.0000 mg | Freq: Four times a day (QID) | INTRAMUSCULAR | Status: DC | PRN
Start: 2011-03-05 — End: 2011-03-06

## 2011-03-05 MED ORDER — LACTATED RINGERS IV SOLN
INTRAVENOUS | Status: DC | PRN
  Administered 2011-03-05 (×3): via INTRAVENOUS

## 2011-03-05 MED ORDER — MORPHINE SULFATE 15 MG PO TABS
15.0000 mg | ORAL_TABLET | Freq: Four times a day (QID) | ORAL | Status: DC | PRN
  Administered 2011-03-08: 15 mg via ORAL
  Filled 2011-03-05: qty 1

## 2011-03-05 MED ORDER — ACETAMINOPHEN 10 MG/ML IV SOLN
1000.0000 mg | Freq: Four times a day (QID) | INTRAVENOUS | Status: DC
  Administered 2011-03-05: 1000 mg via INTRAVENOUS

## 2011-03-05 MED ORDER — ONDANSETRON HCL 4 MG PO TABS: 4.0000 mg | ORAL_TABLET | Freq: Four times a day (QID) | ORAL | Status: DC | PRN

## 2011-03-05 MED ORDER — FENTANYL CITRATE 0.05 MG/ML IJ SOLN
INTRAMUSCULAR | Status: DC | PRN
  Administered 2011-03-05: 50 ug via INTRAVENOUS
  Administered 2011-03-05: 25 ug via INTRAVENOUS

## 2011-03-05 MED ORDER — PSYLLIUM 95 % PO PACK
1.0000 | PACK | Freq: Every day | ORAL | Status: DC
  Administered 2011-03-05 – 2011-03-06 (×2): 1 via ORAL
  Filled 2011-03-05 (×4): qty 1

## 2011-03-05 MED ORDER — MAGNESIUM HYDROXIDE 400 MG/5ML PO SUSP: 30.0000 mL | Freq: Two times a day (BID) | ORAL | Status: DC | PRN

## 2011-03-05 MED ORDER — METHOCARBAMOL 100 MG/ML IJ SOLN
500.0000 mg | Freq: Four times a day (QID) | INTRAVENOUS | Status: DC | PRN
  Administered 2011-03-05: 500 mg via INTRAVENOUS
  Filled 2011-03-05 (×2): qty 5

## 2011-03-05 MED ORDER — POLYETHYLENE GLYCOL 3350 17 G PO PACK
17.0000 g | PACK | Freq: Every day | ORAL | Status: DC | PRN
  Filled 2011-03-05: qty 1

## 2011-03-05 MED ORDER — METHOCARBAMOL 500 MG PO TABS
500.0000 mg | ORAL_TABLET | Freq: Four times a day (QID) | ORAL | Status: DC | PRN
  Administered 2011-03-05 – 2011-03-08 (×8): 500 mg via ORAL
  Filled 2011-03-05 (×9): qty 1

## 2011-03-05 MED ORDER — MENTHOL 3 MG MT LOZG: 1.0000 | LOZENGE | OROMUCOSAL | Status: DC | PRN

## 2011-03-05 MED ORDER — LACTATED RINGERS IV SOLN: INTRAVENOUS | Status: DC

## 2011-03-05 MED ORDER — DIPHENHYDRAMINE HCL 50 MG/ML IJ SOLN: 12.5000 mg | Freq: Four times a day (QID) | INTRAMUSCULAR | Status: DC | PRN

## 2011-03-05 MED ORDER — ACETAMINOPHEN 650 MG RE SUPP: 650.0000 mg | Freq: Four times a day (QID) | RECTAL | Status: DC | PRN

## 2011-03-05 MED ORDER — SENNOSIDES-DOCUSATE SODIUM 8.6-50 MG PO TABS
1.0000 | ORAL_TABLET | Freq: Two times a day (BID) | ORAL | Status: DC | PRN
  Filled 2011-03-05: qty 1

## 2011-03-05 MED ORDER — PROMETHAZINE HCL 25 MG/ML IJ SOLN: 6.2500 mg | INTRAMUSCULAR | Status: DC | PRN

## 2011-03-05 MED ORDER — SODIUM CHLORIDE 0.9 % IJ SOLN: 9.0000 mL | INTRAMUSCULAR | Status: DC | PRN

## 2011-03-05 MED ORDER — PROPOFOL 10 MG/ML IV EMUL
INTRAVENOUS | Status: DC | PRN
  Administered 2011-03-05: 100 ug/kg/min via INTRAVENOUS

## 2011-03-05 SURGICAL SUPPLY — 78 items
ADAPTER BOLT FEMORAL +2/-2 (Knees) ×1 IMPLANT
ADPR FEM +2/-2 OFST BOLT (Knees) ×2 IMPLANT
ADPR FEM 5D STRL KN PFC SGM (Orthopedic Implant) ×2 IMPLANT
AUG FEM SZ5 4 CMB POST STRL LF (Orthopedic Implant) ×2 IMPLANT
AUG FEM SZ5 4 STRL LF KN RT TI (Knees) ×2 IMPLANT
AUG TIB SZ5 5 REV STP WDG STRL (Knees) ×4 IMPLANT
AUGMENT DIST PFC SIGMA 4MM RT (Knees) IMPLANT
AUGMENT FMRL POST PFC 4MM SZ5 (Orthopedic Implant) IMPLANT
BAG SPEC THK2 15X12 ZIP CLS (MISCELLANEOUS) ×2
BAG ZIPLOCK 12X15 (MISCELLANEOUS) ×3 IMPLANT
BANDAGE ELASTIC 6 VELCRO ST LF (GAUZE/BANDAGES/DRESSINGS) ×3 IMPLANT
BANDAGE ESMARK 6X9 LF (GAUZE/BANDAGES/DRESSINGS) ×2 IMPLANT
BLADE SAG 18X100X1.27 (BLADE) ×3 IMPLANT
BLADE SAW SGTL 11.0X1.19X90.0M (BLADE) ×3 IMPLANT
BNDG CMPR 9X6 STRL LF SNTH (GAUZE/BANDAGES/DRESSINGS) ×2
BNDG ESMARK 6X9 LF (GAUZE/BANDAGES/DRESSINGS) ×3
BONE CEMENT GENTAMICIN (Cement) ×9 IMPLANT
CATH KIT ON-Q SILVERSOAK 5 (CATHETERS) ×2 IMPLANT
CATH KIT ON-Q SILVERSOAK 5IN (CATHETERS) ×3 IMPLANT
CLOTH BEACON ORANGE TIMEOUT ST (SAFETY) ×3 IMPLANT
CO AXIAL FAN SPRAY TIP SOFT SH (MISCELLANEOUS) ×1 IMPLANT
CONT SPECI 4OZ STER CLIK (MISCELLANEOUS) ×3 IMPLANT
CUFF TOURN SGL QUICK 34 (TOURNIQUET CUFF) ×3
CUFF TRNQT CYL 34X4X40X1 (TOURNIQUET CUFF) ×2 IMPLANT
DIS AUG PFC SIGMA 4MM RIGHT (Knees) ×3 IMPLANT
DRAPE EXTREMITY T 121X128X90 (DRAPE) ×3 IMPLANT
DRAPE POUCH INSTRU U-SHP 10X18 (DRAPES) ×3 IMPLANT
DRAPE U-SHAPE 47X51 STRL (DRAPES) ×3 IMPLANT
DRSG ADAPTIC 3X8 NADH LF (GAUZE/BANDAGES/DRESSINGS) ×3 IMPLANT
DRSG PAD ABDOMINAL 8X10 ST (GAUZE/BANDAGES/DRESSINGS) ×1 IMPLANT
DURAPREP 26ML APPLICATOR (WOUND CARE) ×3 IMPLANT
ELECT REM PT RETURN 9FT ADLT (ELECTROSURGICAL) ×3
ELECTRODE REM PT RTRN 9FT ADLT (ELECTROSURGICAL) ×2 IMPLANT
EVACUATOR 1/8 PVC DRAIN (DRAIN) ×3 IMPLANT
FACESHIELD LNG OPTICON STERILE (SAFETY) ×15 IMPLANT
FEM POST AUG PFC 4MM SZ5 (Orthopedic Implant) ×3 IMPLANT
FEM TC3 PFC SIGMA SZ5 (Orthopedic Implant) ×3 IMPLANT
FEMORAL ADAPTER (Orthopedic Implant) ×1 IMPLANT
FEMORAL TC3 PFC SIGMA SZ5 (Orthopedic Implant) IMPLANT
GLOVE BIO SURGEON STRL SZ7.5 (GLOVE) ×3 IMPLANT
GLOVE BIO SURGEON STRL SZ8 (GLOVE) ×3 IMPLANT
GLOVE BIOGEL PI IND STRL 8 (GLOVE) ×4 IMPLANT
GLOVE BIOGEL PI INDICATOR 8 (GLOVE) ×2
GOWN PREVENTION PLUS XLARGE (GOWN DISPOSABLE) ×4 IMPLANT
GOWN STRL NON-REIN LRG LVL3 (GOWN DISPOSABLE) ×2 IMPLANT
GOWN STRL REIN XL XLG (GOWN DISPOSABLE) ×3 IMPLANT
HANDPIECE INTERPULSE COAX TIP (DISPOSABLE) ×3
IMMOBILIZER KNEE 20 (SOFTGOODS) ×3
IMMOBILIZER KNEE 20 THIGH 36 (SOFTGOODS) ×2 IMPLANT
INSERT SIGMA RP TC3 5 17.5 (Knees) ×1 IMPLANT
KIT BASIN OR (CUSTOM PROCEDURE TRAY) ×3 IMPLANT
MANIFOLD NEPTUNE II (INSTRUMENTS) ×3 IMPLANT
NS IRRIG 1000ML POUR BTL (IV SOLUTION) ×3 IMPLANT
PACK TOTAL JOINT (CUSTOM PROCEDURE TRAY) ×3 IMPLANT
PAD ABD 7.5X8 STRL (GAUZE/BANDAGES/DRESSINGS) ×3 IMPLANT
PADDING CAST COTTON 6X4 STRL (CAST SUPPLIES) ×9 IMPLANT
PADDING WEBRIL 6 STERILE (GAUZE/BANDAGES/DRESSINGS) ×1 IMPLANT
POSITIONER SURGICAL ARM (MISCELLANEOUS) ×3 IMPLANT
RESTRICTOR CEMENT SZ 5 C-STEM (Cement) ×1 IMPLANT
SET HNDPC FAN SPRY TIP SCT (DISPOSABLE) ×2 IMPLANT
SPONGE GAUZE 4X4 12PLY (GAUZE/BANDAGES/DRESSINGS) ×3 IMPLANT
STAPLER VISISTAT 35W (STAPLE) ×3 IMPLANT
STEM TIBIA PFC 13X30MM (Stem) ×1 IMPLANT
STEM UNIVERSAL REVISION 75X22 (Stem) ×1 IMPLANT
SUCTION FRAZIER 12FR DISP (SUCTIONS) ×3 IMPLANT
SUT PDS AB 1 CT1 27 (SUTURE) ×9 IMPLANT
SUT VIC AB 2-0 CT1 27 (SUTURE) ×12
SUT VIC AB 2-0 CT1 TAPERPNT 27 (SUTURE) ×6 IMPLANT
SWAB COLLECTION DEVICE MRSA (MISCELLANEOUS) ×2 IMPLANT
TOWEL OR 17X26 10 PK STRL BLUE (TOWEL DISPOSABLE) ×6 IMPLANT
TOWER CARTRIDGE SMART MIX (DISPOSABLE) ×3 IMPLANT
TRAY FOLEY CATH 14FRSI W/METER (CATHETERS) ×3 IMPLANT
TRAY SLEEVE CEM ML (Knees) ×1 IMPLANT
TRAY TIB SZ 5 REVISION (Knees) ×1 IMPLANT
TUBE ANAEROBIC SPECIMEN COL (MISCELLANEOUS) ×3 IMPLANT
WATER STERILE IRR 1500ML POUR (IV SOLUTION) ×3 IMPLANT
WEDGE SZ 5 5MM (Knees) ×2 IMPLANT
WRAP KNEE MAXI GEL POST OP (GAUZE/BANDAGES/DRESSINGS) ×6 IMPLANT

## 2011-03-05 NOTE — Transfer of Care (Signed)
Immediate Anesthesia Transfer of Care Note  Patient: Steven [name]  Procedure(s) Performed:  TOTAL KNEE REVISION; KNEE INJECTION - 80 mg depomedrol  Patient Location: PACU  Anesthesia Type: Spinal  Level of Consciousness: awake, alert , oriented and patient cooperative  Airway & Oxygen Therapy: Patient Spontanous Breathing and Patient connected to face mask oxygen  Post-op Assessment: Report given to PACU RN and Post -op Vital signs reviewed and stable  Post vital signs: Reviewed and stable  Complications: No apparent anesthesia complications

## 2011-03-05 NOTE — Interval H&P Note (Signed)
History and Physical Interval Note:   03/05/2011   8:03 AM   Steven [name]  has presented today for surgery, with the diagnosis of Unstable right total knee arthroplasty  The various methods of treatment have been discussed with the patient and family. After consideration of risks, benefits and other options for treatment, the patient has consented to  Procedure(s): TOTAL KNEE REVISION as a surgical intervention .  The patients' history has been reviewed, patient examined, no change in status, stable for surgery.  I have reviewed the patients' chart and labs.  Questions were answered to the patient's satisfaction.     Loanne Drilling  MD  Pt examined. History and physical unchanged  Gus Rankin. Kristofor Michalowski, MD    03/05/2011, 8:03 AM

## 2011-03-05 NOTE — H&P (View-Only) (Signed)
Steven [name] DOB: 04/03/1941  Date Of Admission:  03/05/2011  Chief Complaint: Bilateral Knee Pain  History of Present Illness The patient is a 70 year old male who comes in today for a preoperative History and Physical. The patient is scheduled for a right total knee arthroplasty to be performed by Dr. Frank V. Aluisio, MD at [facility] on 03/05/2011.  The patient will also receive a left knee cortisone injection at the time of surgery.  Allergies: None  Intolerance:  Percocet - makes the patient "wild"  Medical History Chronic Pain Coronary artery disease Gastroesophageal Reflux Disease High blood pressure Hypercholesterolemia Osteoarthritis Impaired Vision. cataracts Impaired Hearing. uses hearing aides Shingles Hemorrhoids Urinary Incontinence. mild Obesity  Past Surgical History Total Knee Replacement. right Cardia Cath x2 Right Eye Laser Surgery. Repair retina  Family History Cancer. grandmother mothers side and grandmother fathers side Chronic Obstructive Lung Disease. father Congestive Heart Failure. mother Heart Disease. father, sister and grandfather mothers side Heart disease in male family member before age 65 Osteoarthritis. mother and father  Social History Alcohol use. never consumed alcohol Children. 3 Current work status. retired Drug/Alcohol Rehab (Currently). no Drug/Alcohol Rehab (Previously). no Exercise. Exercises weekly; does running / walking Illicit drug use. no Living situation. live with spouse Marital status. married Number of flights of stairs before winded. 2-3 Pain Contract. yes Tobacco / smoke exposure. no Tobacco use. Former smoker. never smoker Post-Surgical Plans. Plan for home Advance Directives. Living will, Healthcare power of attorney  Review of Systems: General:Present- Feeling sick. Not Present- Chills, Fever, Night Sweats, Appetite Loss, Fatigue, Weight Gain and  Weight Loss. Skin:Present- Itching and Rash. Not Present- Skin Color Changes, Ulcer, Psoriasis and Change in Hair or Nails. HEENT:Not Present- Sensitivity to light, Hearing problems, Nose Bleed and Ringing in the Ears. Neck:Not Present- Swollen Glands and Neck Mass. Respiratory:Not Present- Snoring, Chronic Cough, Bloody sputum and Dyspnea. Cardiovascular:Not Present- Shortness of Breath, Chest Pain, Swelling of Extremities, Leg Cramps and Palpitations. Gastrointestinal:Not Present- Bloody Stool, Heartburn, Abdominal Pain, Vomiting, Nausea and Incontinence of Stool. Male Genitourinary:Not Present- Blood in Urine, Frequency, Incontinence and Nocturia. Musculoskeletal:Present- Joint Stiffness and Joint Swelling. Not Present- Muscle Weakness, Muscle Pain, Joint Pain and Back Pain. Neurological:Not Present- Tingling, Numbness, Burning, Tremor, Headaches and Dizziness. Psychiatric:Not Present- Anxiety, Depression and Memory Loss. Endocrine:Not Present- Cold Intolerance, Heat Intolerance, Excessive hunger and Excessive Thirst. Hematology:Not Present- Abnormal Bleeding, Anemia, Blood Clots and Easy Bruising.   Vitals: Weight: 280 lb Height: 70.5 in Weight was reported by patient. Height was reported by patient. Body Surface Area: 2.51 m Body Mass Index: 39.61 kg/m Pulse: 64 (Regular) Resp.: 16 (Unlabored) BP: 128/80 (Sitting, Right Arm, Standard)  EXAM GENERAL: Patient is a 70 y.o. male, well-nourished, well-developed, no acute distress. Alert, oriented, cooperative. Overweight, obese. Good historian. Accompanied by his wife. HENT:  Normocephalic, atraumatic. Pupils round and reactive. EOMs intact. NECK:  Supple, no bruits. CHEST:  Clear to anterior and posterior chest walls. No rhonchi, rales, wheezes. HEART:  Regular, rate and rhythm. Faint systolic ejection murmur.  S1 and S2 noted. ABDOMEN:  Soft, nontender, protuberant abdomen, bowel sounds  present. RECTAL/BREAST/GENITALIA:  Not done, not pertinent to present illness. EXTREMITIES:  Right knee - positive effusion, ROM 0-120, some instability.   Assessment & Plan Loose right total knee arthroplasty (996.41) Osteoarthrosis  Left Knee  Plan is for the patient to undergo a revision right total knee arthroplasty and a left knee cortisone injection. Risks and benefits of the surgery have been discussed   with the patient and they elect to proceed with surgery.  There are on active contraindications to upcoming procedure such as ongoing infection or progressive neurological disease.  

## 2011-03-05 NOTE — Op Note (Signed)
NAME:  Steven [name], Steven [name] NO.:  [id number]  MEDICAL RECORD NO.:  [id number]  LOCATION:  [facility]                         FACILITY:  [facility]  PHYSICIAN:  Ollen Gross, M.D.    DATE OF BIRTH:  Aug 28, 1940  DATE OF PROCEDURE:  03/05/2011 DATE OF DISCHARGE:                              OPERATIVE REPORT   PREOPERATIVE DIAGNOSIS:  Unstable right total knee arthroplasty.  POSTOPERATIVE DIAGNOSIS:  Unstable right total knee arthroplasty.  PROCEDURE:  Right total knee arthroplasty revision.  SURGEON:  Ollen Gross, M.D.  ASSISTANT:  Alexzandrew L. Perkins, P.A.C.  ANESTHESIA:  General.  ESTIMATED BLOOD LOSS:  100.  DRAIN:  Hemovac x1.  TOURNIQUET TIME:  Up 36 minutes at 300 mmHg, down 10 minutes.  Up additional 22 minutes at 300 mmHg.  COMPLICATIONS:  None.  CONDITION:  Stable to Recovery.  BRIEF CLINICAL NOTE:  Steven [name] is a 70 year old male who had a right total knee arthroplasty done approximately 3 years ago.  He has had significant instability and pain.  He has had an infection workup, which has been negative.  He has a grossly unstable knee.  He presents now for total knee arthroplasty revision.  PROCEDURE IN DETAIL:  After successful administration of spinal anesthetic, a tourniquet was placed high on his right thigh and his right lower extremity was prepped and draped in a usual sterile fashion. Extremity is wrapped in Esmarch, knee flexed, tourniquet inflated to 300 mmHg.  Midline incision was made with a 10 blade through subcutaneous tissue to the level of the extensor mechanism.  A fresh blade was used to make a medial parapatellar arthrotomy.  Minimal fluid was encountered in the joint.  There was some hypertrophic synovium consistent with a reaction to polyethylene debris as opposed to any inflamed synovium. The synovium is removed.  A soft tissue on the proximal medial tibia subperiosteally elevated to the joint line with a knife and into  the semimembranosus bursa with a Cobb elevator.  The patella is subluxed laterally.  The knee was flexed 90 degrees.  I was easily able to sublux the tibia forward and remove the tibial polyethylene.  The junction between the femoral component and bone was then disrupted with an osteotome with the femoral component removed without difficulty.  The tibia was then subluxed forward, retractors placed, and the interface between tibial component and bone was disrupted with a saw and osteotome and removed.  The tibial component was somewhat loose and removed easily.  The cement was then removed from the tibia and then the canal copiously irrigated and reamed up to 13 mm.  Extramedullary tibial alignment guide was then placed and referencing proximally at the medial aspect of tibial tubercle and distally along the second metatarsal axis of the tibial crest.  The block was pinned to remove about 2 mm from the tibial surface.  This got [place] a nice flat level surface with good cancellous bone.  We then prepared proximally for 29-mm sleeve.  The size 5 was most appropriate on the tibia and the size 5 base plate was placed and we then reamed proximally and then placed a trial, which was a size 5 with  5 mm augments medially and laterally, 29 mm sleeve, and 13 x 30 stem extension.  This had excellent fit on the cut bone surface.  On the femoral side, we then removed all the cement and irrigated the canal.  I reamed up to 22 mm to get a good press-fit on the stem.  The reamer was left intact to serve as our intramedullary cutting guide. The 5 degree right valgus alignment guide was placed.  We pinned the block to remove 2 mm distally.  I had to go up to a 4 mm augment laterally and was able to not utilize an augment medially.  Size 5 was the most appropriate femoral component.  We placed the size 5 AP cutting block in a +2 position to effectively lower the implant to the stem. The anterior and posterior  chamfer cuts made.  We needed to use a 4 mm augment posteromedially.  Intercondylar blocks placed and that cuts made.  Trial femur was placed, which was a size 5 TC3 femur with a 4 mm distal lateral augment, 4 mm posterior medial augment, and the stem extension which was 75 x 22 in the +2 position.  The trial was placed with a 17.5 mm TC3 insert.  Full extension was achieved with excellent varus-valgus and anterior-posterior balance throughout full range of motion.  Patella was then everted and components found to be in good position, but is covered with hypertrophic synovium.  Patelloplasty was then performed and the patellar component left intact.  The patella tracks normally.  Excellent stability was noted throughout full range of motion.  Moist sponge was placed and the tourniquet released for initial tourniquet time of 36 minutes.  The tourniquet was held down for 10 minutes while the components were assembled on the back table.  After 10 minutes, re-wrapped the leg in Esmarch and reinflated tourniquet to 300 mmHg.  I then removed the trial components and we sized the tibial cement restrictor, which was a size 5.  The size 5 cement restrictor was placed at the appropriate depth in the femoral canal.  The cut bone surfaces were then prepared with pulsatile lavage while the three batches of cement impregnated with gentamicin were mixed on the back table.  Once cement was ready, the tibial components were cemented, which was the size 5 MET revision tray, 13 x30 stem extension, 5 mm medial and lateral augment and a 29 sleeve.  This was impacted with excellent stability.  All extruded cement removed.  On the femoral side, we impacted the femoral component, which was a size 5 TC3 femur with a 75 x 22 stem extension in a +2 position, 4 mm distal lateral augment, 4 mm posterior medial augment.  This was impacted and all extruded cement removed.  Trial 17.5 TC3 rotating platform insert was  placed.  Knee was held in full extension, all extruded cement removed.  Once the cement was fully hardened and the knee permanent, 17.5 mm TC3 rotating platform insert was placed in tibial tray.  Once again, knee has full extension with excellent varus-valgus and anterior-posterior stability throughout full range of motion.  Patella tracks normally.  Wound was further irrigated with saline solution and the knee arthrotomy closed over Hemovac drain with interrupted #1 PDS.  Flexion against gravity was 135 degrees.  Patella tracked normally.  Tourniquet was released, second tourniquet time of 22 minutes.  Subcu was then closed with interrupted 2- 0 Vicryl and skin with staples.  Catheter for the Marcaine pain  pump was placed and the pumps initiated.  Incision was then cleaned and dried and Steri-Strips and a bulky sterile dressing applied.  In addition, the patient has symptomatic osteoarthritis of  his left knee and requested cortisone injection.  After sterile prep, 80 mg of Depo-Medrol were injected into the left knee.  Band-aid was placed without difficulty.  Please note that a surgical assistant was a medical necessity to perform this procedure in a safe and expeditious manner.  Surgical assistant was necessary for retraction of vital neurovascular structures and also for proper placement of the limb to allow for anatomic placement of the prosthetic components.     Ollen Gross, M.D.     FA/MEDQ  D:  03/05/2011  T:  03/05/2011  Job:  [id number]

## 2011-03-05 NOTE — Preoperative (Signed)
Beta Blockers   Reason not to administer Beta Blockers:Not Applicable 

## 2011-03-05 NOTE — Anesthesia Postprocedure Evaluation (Signed)
Anesthesia Post Note  Patient: Steven [name]  Procedure(s) Performed:  TOTAL KNEE REVISION; KNEE INJECTION - 80 mg depomedrol  Anesthesia type: Spinal  Patient location: PACU  Post pain: Pain level controlled  Post assessment: Post-op Vital signs reviewed  Last Vitals:  Filed Vitals:   03/05/11 1145  BP:   Pulse:   Temp: 36.4 C  Resp:     Post vital signs: Reviewed  Level of consciousness: sedated  Complications: No apparent anesthesia complications

## 2011-03-05 NOTE — Anesthesia Procedure Notes (Addendum)
Spinal  Start time: 03/05/2011 8:30 AM Staffing Anesthesiologist: Lucille Passy F Performed by: anesthesiologist  Preanesthetic Checklist Completed: patient identified, surgical consent, pre-op evaluation, risks and benefits discussed and monitors and equipment checked Spinal Block Patient position: sitting Prep: Betadine Patient monitoring: heart rate, continuous pulse ox and blood pressure Approach: midline Location: L3-4 Injection technique: single-shot Needle Needle type: Quincke  Needle gauge: 22 G Assessment Sensory level: T6 Additional Notes Lot #[id number] with date of expiration 02/2012 Negative heme or paresthesia.

## 2011-03-05 NOTE — Anesthesia Preprocedure Evaluation (Addendum)
Anesthesia Evaluation  Patient identified by MRN, date of birth, ID band Patient awake    Reviewed: Allergy & Precautions, H&P , NPO status , Patient's Chart, lab work & pertinent test results  Airway Mallampati: III TM Distance: >3 FB Neck ROM: Full    Dental No notable dental hx. (+) Teeth Intact and Caps,    Pulmonary neg pulmonary ROS, shortness of breath and with exertion, sleep apnea (Patient thinks he may have sleep apnea however he has never been evaluated) ,  clear to auscultation  Pulmonary exam normal       Cardiovascular hypertension, Pt. on medications + CAD (Small vessel disease with medical management) Regular Normal    Neuro/Psych Shingles on scalp 3 weeks ago Negative Neurological ROS  Negative Psych ROS   GI/Hepatic negative GI ROS, Neg liver ROS, GERD-  Medicated and Controlled,  Endo/Other  Negative Endocrine ROSMorbid obesity  Renal/GU negative Renal ROS  Genitourinary negative   Musculoskeletal negative musculoskeletal ROS (+)   Abdominal (+) obese,   Peds negative pediatric ROS (+)  Hematology negative hematology ROS (+)   Anesthesia Other Findings   Reproductive/Obstetrics negative OB ROS                        Anesthesia Physical Anesthesia Plan  ASA: III  Anesthesia Plan: Spinal   Post-op Pain Management:    Induction:   Airway Management Planned:   Additional Equipment:   Intra-op Plan:   Post-operative Plan:   Informed Consent: I have reviewed the patients History and Physical, chart, labs and discussed the procedure including the risks, benefits and alternatives for the proposed anesthesia with the patient or authorized representative who has indicated his/her understanding and acceptance.   Dental advisory given  Plan Discussed with: CRNA  Anesthesia Plan Comments:         Anesthesia Quick Evaluation

## 2011-03-05 NOTE — Brief Op Note (Signed)
03/05/2011  10:12 AM  PATIENT:  Steven [name]  70 y.o. male  PRE-OPERATIVE DIAGNOSIS:  Unstable right total knee arthroplasty  POST-OPERATIVE DIAGNOSIS:  unstable right total knee  PROCEDURE:  Procedure(s):       Right TOTAL KNEE Arthroplasty REVISION        Left   KNEE cortisone INJECTION  SURGEON:  Surgeon(s): Gus Rankin Tatum Massman  PHYSICIAN ASSISTANT:   ASSISTANTS: Avel Peace, PA-C   ANESTHESIA:   spinal  EBL:  Total I/O In: 1000 [I.V.:1000] Out: 225 [Urine:225]  BLOOD ADMINISTERED:none  DRAINS: (medium) Hemovact drain(s) in the right knee with  Suction Open   LOCAL MEDICATIONS USED:Marcaine pain pump  SPECIMEN:  No Specimen  DISPOSITION OF SPECIMEN:  N/A  COUNTS:  YES  TOURNIQUET:   Total Tourniquet Time Documented: Thigh (Right) - 71 minutes  DICTATION: .Other Dictation: Dictation Number [id number]  PLAN OF CARE: Admit to inpatient   PATIENT DISPOSITION:  PACU - hemodynamically stable.   Delay start of Pharmacological VTE agent (>24hrs) due to surgical blood loss or risk of bleeding:  yes

## 2011-03-06 LAB — BASIC METABOLIC PANEL
BUN: 20 mg/dL (ref 6–23)
Calcium: 8.8 mg/dL (ref 8.4–10.5)
Chloride: 100 mEq/L (ref 96–112)
Creatinine, Ser: 0.84 mg/dL (ref 0.50–1.35)
GFR calc Af Amer: >90 mL/min (ref >90)
GFR calc non Af Amer: 87 mL/min — ABNORMAL LOW (ref >90)

## 2011-03-06 LAB — CBC
HCT: 32.2 % — ABNORMAL LOW (ref 39.0–52.0)
MCH: 30.8 pg (ref 26.0–34.0)
MCHC: 33.9 g/dL (ref 30.0–36.0)
MCV: 91 fL (ref 78.0–100.0)
RDW: 13.2 % (ref 11.5–15.5)

## 2011-03-06 MED ORDER — HYDROMORPHONE HCL PF 1 MG/ML IJ SOLN
1.0000 mg | INTRAMUSCULAR | Status: DC | PRN
  Administered 2011-03-06 – 2011-03-07 (×3): 1 mg via INTRAVENOUS
  Filled 2011-03-06 (×4): qty 1

## 2011-03-06 NOTE — Progress Notes (Signed)
Physical Therapy Treatment Patient Details Name: Steven [name] MRN: [id number] DOB: [birth year] Today's Date: 03/06/2011 1440-1452 1TE PT Assessment/Plan  PT - Assessment/Plan Comments on Treatment Session: ortho tech present after PT to place CPM; pt back in bed with nsg staff PT Plan: Discharge plan remains appropriate;Frequency remains appropriate PT Frequency: 7X/week Follow Up Recommendations: Home health PT Equipment Recommended: Rolling walker with 5" wheels PT Goals    PT Treatment Precautions/Restrictions  Precautions Precautions: Knee Precaution Comments: no pillow under knee Restrictions Weight Bearing Restrictions:   RLE Weight Bearing: Weight bearing as tolerated Exercise  Total Joint Exercises Ankle Circles/Pumps: AROM;Both;10 reps Quad Sets: AROM;Right;10 reps Towel Squeeze: AROM;Right;10 reps;Supine Heel Slides: AAROM;Right;10 reps;Supine Hip ABduction/ADduction: AROM;Right;10 reps;Supine Straight Leg Raises: AAROM;10 reps;Supine;Right End of Session PT - End of Session Activity Tolerance: Patient tolerated treatment well Patient left: in bed  Behavior During Session: [facility] for tasks performed Cognition: [facility] for tasks performed  [facility] 03/06/2011, 4:44 PM

## 2011-03-06 NOTE — Progress Notes (Signed)
Physical Therapy Evaluation Patient Details Name: Steven [name] MRN: [id number] DOB: Nov 19, 1940 Today's Date: 03/06/2011 [id number] eval 2 Problem List:  Patient Active Problem List  Diagnoses  . CORONARY ATHEROSCLEROSIS NATIVE CORONARY ARTERY  . OTHER DYSPNEA AND RESPIRATORY ABNORMALITIES  . ABNORMAL ELECTROCARDIOGRAM    Past Medical History:  Past Medical History  Diagnosis Date  . Obesity   . Dyslipidemia   . Coronary artery disease 02-27-11    Dr. Marliss Coots follows-has some blocked coronary arteries ,not suitable for stent  placement  . Hypertension   . Shingles outbreak 02-27-11    2 weeks ago , was tx.-only residual is tenderness of right scalp-no open areas  . GERD (gastroesophageal reflux disease) 02-27-11    Acid reflux  . Hemorrhoids 02-27-11    not bothersome at this time  . Constipation - functional 02-27-11    due to pain meds  . Hearing loss 02-27-11    Bilateral hearing aids-due to exposure to loud machinery  . Urinary incontinence 02-27-11    not an everday occurrence-no special measures  . Osteoarthritis 02-27-11    Ostearthritis-knees, shoulders.Back causes chronic pain-radiates down right leg   Past Surgical History:  Past Surgical History  Procedure Date  . Cardiac catheterization 2011    some blockages, no stents planned due to Joint surgery planned  . Replacement total knee 02-27-11    right- 2007    PT Assessment/Plan/Recommendation PT Assessment Clinical Impression Statement: pt will benefit from PT to maximize independence for home PT Recommendation/Assessment: Patient will need skilled PT in the acute care venue PT Problem List: Decreased strength;Decreased range of motion;Decreased activity tolerance;Decreased balance;Decreased mobility;Decreased knowledge of use of DME;Pain Barriers to Discharge: None PT Therapy Diagnosis : Difficulty walking PT Plan PT Frequency: 7X/week PT Treatment/Interventions: DME instruction;Gait  training;Functional mobility training;Therapeutic exercise;Balance training;Patient/family education;Stair training PT Recommendation Follow Up Recommendations: Home health PT Equipment Recommended: Rolling walker with 5" wheels PT Goals  Acute Rehab PT Goals PT Goal Formulation: With patient Time For Goal Achievement: 7 days Pt will go Supine/Side to Sit: with min assist;with HOB 0 degrees PT Goal: Supine/Side to Sit - Progress: Progressing toward goal Pt will go Sit to Supine/Side: with min assist;with HOB 0 degrees PT Goal: Sit to Supine/Side - Progress: Other (comment) Pt will Transfer Sit to Stand/Stand to Sit: with supervision PT Transfer Goal: Sit to Stand/Stand to Sit - Progress: Progressing toward goal Pt will Ambulate: 51 - 150 feet;with supervision PT Goal: Ambulate - Progress: Progressing toward goal Pt will Go Up / Down Stairs: 1-2 stairs;with least restrictive assistive device PT Goal: Up/Down Stairs - Progress: Other (comment) Pt will Perform Home Exercise Program: with supervision, verbal cues required/provided PT Goal: Perform Home Exercise Program - Progress: Progressing toward goal  PT Evaluation Precautions/Restrictions  Precautions Precautions: Knee Precaution Comments: no pillow under knee Restrictions RLE Weight Bearing: Weight bearing as tolerated Prior Functioning  Home Living Lives With: Spouse Receives Help From: Family (spouse) Home Layout: One level Home Access: Stairs to enter Entrance Stairs-Rails: None Entrance Stairs-Number of Steps: 2 Home Adaptive Equipment: None Prior Function Level of Independence: Independent with gait;Independent with transfers Cognition Cognition Arousal/Alertness: Awake/alert Overall Cognitive Status: Appears within functional limits for tasks assessed Sensation/Coordination   Extremity Assessment RLE Assessment RLE Assessment:  (ankle WFL, otherwise too painful to test) LLE Assessment LLE Assessment:  (grossly  WFL, pt states weakness knee; s/p cortisone shot) Mobility (including Balance) Bed Mobility Supine to Sit: Other (comment) (NT, pt sitting EOB) Transfers Sit  to Stand: 1: +2 Total assist;From bed Sit to Stand Details (indicate cue type and reason): +2 for wt shift, balance, verbal cues for hands and safety; pt = 65% Stand to Sit: 3: Mod assist;To chair/3-in-1 Stand to Sit Details: verbal cues for hand placement and RLE position Ambulation/Gait Ambulation/Gait Assistance: 1: +2 Total assist Ambulation/Gait Assistance Details (indicate cue type and reason): +2 for safety, balance, lines; verbal cues for RW distance from self, posture and breathing Ambulation Distance (Feet): 100 Feet Assistive device: Rolling walker    Exercise  Total Joint Exercises Ankle Circles/Pumps: AROM;Both;10 reps Quad Sets: AROM;5 reps;Both End of Session PT - End of Session Equipment Utilized During Treatment: Gait belt;Right knee immobilizer Activity Tolerance: Patient tolerated treatment well Patient left: in chair General Behavior During Session: [facility] for tasks performed Cognition: [facility] for tasks performed  [facility] 03/06/2011, 1:09 PM

## 2011-03-06 NOTE — Progress Notes (Signed)
Subjective: 1 Day Post-Op Procedure(s) (LRB): TOTAL KNEE REVISION (Right) KNEE INJECTION (Left) Patient reports pain as mild and moderate.   Patient seen in rounds with Dr. Lequita Halt. Patient has complaints of not much sleep  We will start therapy today. Plan is to go home after hospital stay. Wife in room.  Objective: Vital signs in last 24 hours: Temp:  [97.2 F (36.2 C)-98 F (36.7 C)] 98 F (36.7 C) (11/22 0530) Pulse Rate:  [59-85] 73  (11/22 0530) Resp:  [10-20] 12  (11/22 0530) BP: (96-122)/(58-82) 111/81 mmHg (11/22 0530) SpO2:  [91 %-100 %] 98 % (11/22 0530) Weight:  [127.007 kg (280 lb)] 280 lb (127.007 kg) (11/21 1500)  Intake/Output from previous day:  Intake/Output Summary (Last 24 hours) at 03/06/11 1610 Last data filed at 03/06/11 0608  Gross per 24 hour  Intake 5991.67 ml  Output   2120 ml  Net 3871.67 ml    Intake/Output this shift:    Labs: Results for orders placed during the hospital encounter of 03/05/11  TYPE AND SCREEN      Component Value Range   ABO/RH(D) A POS     Antibody Screen NEG     Sample Expiration 03/08/2011    CBC      Component Value Range   WBC 9.5  4.0 - 10.5 (K/uL)   RBC 3.54 (*) 4.22 - 5.81 (MIL/uL)   Hemoglobin 10.9 (*) 13.0 - 17.0 (g/dL)   HCT 96.0 (*) 45.4 - 52.0 (%)   MCV 91.0  78.0 - 100.0 (fL)   MCH 30.8  26.0 - 34.0 (pg)   MCHC 33.9  30.0 - 36.0 (g/dL)   RDW 09.8  11.9 - 14.7 (%)   Platelets 171  150 - 400 (K/uL)  BASIC METABOLIC PANEL      Component Value Range   Sodium 133 (*) 135 - 145 (mEq/L)   Potassium 4.2  3.5 - 5.1 (mEq/L)   Chloride 100  96 - 112 (mEq/L)   CO2 26  19 - 32 (mEq/L)   Glucose, Bld 145 (*) 70 - 99 (mg/dL)   BUN 20  6 - 23 (mg/dL)   Creatinine, Ser 8.29  0.50 - 1.35 (mg/dL)   Calcium 8.8  8.4 - 56.2 (mg/dL)   GFR calc non Af Amer 87 (*) >90 (mL/min)   GFR calc Af Amer >90  >90 (mL/min)    Exam - Neurovascular intact Sensation intact distally Intact pulses distally Dressing - clean,  dry Motor function intact - moving foot and toes well on exam.  Hemovac pulled without difficulty.  Assessment/Plan: 1 Day Post-Op Procedure(s) (LRB): TOTAL KNEE REVISION (Right) KNEE INJECTION (Left)  Past Medical History  Diagnosis Date  . Obesity   . Dyslipidemia   . Coronary artery disease 02-27-11    Dr. Marliss Coots follows-has some blocked coronary arteries ,not suitable for stent  placement  . Hypertension   . Shingles outbreak 02-27-11    2 weeks ago , was tx.-only residual is tenderness of right scalp-no open areas  . GERD (gastroesophageal reflux disease) 02-27-11    Acid reflux  . Hemorrhoids 02-27-11    not bothersome at this time  . Constipation - functional 02-27-11    due to pain meds  . Hearing loss 02-27-11    Bilateral hearing aids-due to exposure to loud machinery  . Urinary incontinence 02-27-11    not an everday occurrence-no special measures  . Osteoarthritis 02-27-11    Ostearthritis-knees, shoulders.Back causes chronic pain-radiates down right  leg    Advance diet Up with therapy Discharge home with home health when met goals  DVT Prophylaxis - Xarelto  Protocol Weight-Bearing as tolerated to right leg Keep foley until tomorrow. No vaccines. DC PCA Keep Foley one more day IV push backup  Angelissa Supan 03/06/2011, 7:12 AM

## 2011-03-07 LAB — BASIC METABOLIC PANEL
BUN: 16 mg/dL (ref 6–23)
Creatinine, Ser: 0.85 mg/dL (ref 0.50–1.35)
GFR calc Af Amer: >90 mL/min (ref >90)
GFR calc non Af Amer: 87 mL/min — ABNORMAL LOW (ref >90)
Potassium: 4.1 mEq/L (ref 3.5–5.1)

## 2011-03-07 LAB — CBC
HCT: 30.7 % — ABNORMAL LOW (ref 39.0–52.0)
MCHC: 33.6 g/dL (ref 30.0–36.0)
RDW: 13.6 % (ref 11.5–15.5)

## 2011-03-07 MED ORDER — METHOCARBAMOL 500 MG PO TABS: 500.0000 mg | ORAL_TABLET | Freq: Four times a day (QID) | ORAL | Status: AC | PRN

## 2011-03-07 MED ORDER — HYDROMORPHONE HCL 2 MG PO TABS: 2.0000 mg | ORAL_TABLET | ORAL | Status: AC | PRN

## 2011-03-07 MED ORDER — LIDOCAINE 5 % EX PTCH
1.0000 | MEDICATED_PATCH | CUTANEOUS | Status: DC
  Administered 2011-03-07: 1 via TRANSDERMAL
  Filled 2011-03-07 (×2): qty 1

## 2011-03-07 MED ORDER — RIVAROXABAN 10 MG PO TABS: 10.0000 mg | ORAL_TABLET | ORAL | Status: DC

## 2011-03-07 NOTE — Progress Notes (Signed)
Physical Therapy Treatment Patient Details Name: Steven [name] MRN: [id number] DOB: 1940/10/21 Today's Date: 03/07/2011 1425-1446 PT Assessment/Plan  PT - Assessment/Plan Comments on Treatment Session: discussed car transfer and stair training tomorrow PT Plan: Discharge plan remains appropriate;Frequency remains appropriate PT Frequency: 7X/week Follow Up Recommendations: Home health PT Equipment Recommended: Rolling walker with 5" wheels PT Goals  Acute Rehab PT Goals PT Goal Formulation: With patient PT Goal: Supine/Side to Sit - Progress: Met PT Goal: Sit to Supine/Side - Progress: Met PT Transfer Goal: Sit to Stand/Stand to Sit - Progress: Progressing toward goal PT Goal: Ambulate - Progress: Progressing toward goal PT Goal: Perform Home Exercise Program - Progress: Progressing toward goal  PT Treatment Precautions/Restrictions  Precautions Precautions: Knee Precaution Comments: no pillow under knee Restrictions Weight Bearing Restrictions: No RLE Weight Bearing: Weight bearing as tolerated Mobility (including Balance) Bed Mobility Supine to Sit: 4: Min assist Transfers Sit to Stand: 4: Min assist;From bed Stand to Sit: To bed;4: Min assist Ambulation/Gait Ambulation/Gait Assistance: 4: Min Environmental consultant (Feet): 100 Feet Assistive device: Rolling walker Stairs:  (practice in am)    End of Session PT - End of Session Equipment Utilized During Treatment: Right knee immobilizer Activity Tolerance: Patient tolerated treatment well Patient left: in bed Nurse Communication:  (pain meds) General Behavior During Session: [facility] for tasks performed Cognition: [facility] for tasks performed  [facility] 03/07/2011, 3:00 PM

## 2011-03-07 NOTE — Progress Notes (Signed)
Occupational Therapy Note Chart reviewed. Spoke with patient and wife. Per their report, wife has been assisting patient up to the bathroom and on/off 3in1 multiple times this am. They both declined the need to practice this further. They also declined need to practice a shower transfer, stating they felt they would be ok with all ADL. Wife able to assist at discharge. They do request a 3in1 and a shower seat with handles for discharge. Thank you. Judithann Sauger OTR/L [phone]

## 2011-03-07 NOTE — Progress Notes (Signed)
Subjective: 2 Days Post-Op Procedure(s) (LRB): TOTAL KNEE REVISION (Right) KNEE INJECTION (Left) Patient reports pain as moderate.   Patient seen in rounds with Dr. Lequita Halt. Patient has complaints of multiple BMs last night but states it was not diarrhea as it was formed each time.No abdominal pain today  Objective: Vital signs in last 24 hours: Temp:  [97.8 F (36.6 C)-98.6 F (37 C)] 98.3 F (36.8 C) (11/23 0550) Pulse Rate:  [73-107] 91  (11/23 0550) Resp:  [16] 16  (11/23 0550) BP: (125-160)/(79-92) 160/92 mmHg (11/23 0550) SpO2:  [95 %-99 %] 95 % (11/23 0550) FiO2 (%):  [2 %] 2 % (11/22 1248)  Intake/Output from previous day:  Intake/Output Summary (Last 24 hours) at 03/07/11 0801 Last data filed at 03/07/11 0550  Gross per 24 hour  Intake 2263.33 ml  Output   3302 ml  Net -1038.67 ml    Intake/Output this shift:    Labs: Results for orders placed during the hospital encounter of 03/05/11  TYPE AND SCREEN      Component Value Range   ABO/RH(D) A POS     Antibody Screen NEG     Sample Expiration 03/08/2011    CBC      Component Value Range   WBC 9.5  4.0 - 10.5 (K/uL)   RBC 3.54 (*) 4.22 - 5.81 (MIL/uL)   Hemoglobin 10.9 (*) 13.0 - 17.0 (g/dL)   HCT 16.1 (*) 09.6 - 52.0 (%)   MCV 91.0  78.0 - 100.0 (fL)   MCH 30.8  26.0 - 34.0 (pg)   MCHC 33.9  30.0 - 36.0 (g/dL)   RDW 04.5  40.9 - 81.1 (%)   Platelets 171  150 - 400 (K/uL)  BASIC METABOLIC PANEL      Component Value Range   Sodium 133 (*) 135 - 145 (mEq/L)   Potassium 4.2  3.5 - 5.1 (mEq/L)   Chloride 100  96 - 112 (mEq/L)   CO2 26  19 - 32 (mEq/L)   Glucose, Bld 145 (*) 70 - 99 (mg/dL)   BUN 20  6 - 23 (mg/dL)   Creatinine, Ser 9.14  0.50 - 1.35 (mg/dL)   Calcium 8.8  8.4 - 78.2 (mg/dL)   GFR calc non Af Amer 87 (*) >90 (mL/min)   GFR calc Af Amer >90  >90 (mL/min)  CBC      Component Value Range   WBC 8.8  4.0 - 10.5 (K/uL)   RBC 3.36 (*) 4.22 - 5.81 (MIL/uL)   Hemoglobin 10.3 (*) 13.0 - 17.0  (g/dL)   HCT 95.6 (*) 21.3 - 52.0 (%)   MCV 91.4  78.0 - 100.0 (fL)   MCH 30.7  26.0 - 34.0 (pg)   MCHC 33.6  30.0 - 36.0 (g/dL)   RDW 08.6  57.8 - 46.9 (%)   Platelets 133 (*) 150 - 400 (K/uL)  BASIC METABOLIC PANEL      Component Value Range   Sodium 134 (*) 135 - 145 (mEq/L)   Potassium 4.1  3.5 - 5.1 (mEq/L)   Chloride 100  96 - 112 (mEq/L)   CO2 26  19 - 32 (mEq/L)   Glucose, Bld 121 (*) 70 - 99 (mg/dL)   BUN 16  6 - 23 (mg/dL)   Creatinine, Ser 6.29  0.50 - 1.35 (mg/dL)   Calcium 8.5  8.4 - 52.8 (mg/dL)   GFR calc non Af Amer 87 (*) >90 (mL/min)   GFR calc Af Amer >90  >90 (  mL/min)    Exam - Neurologically intact ABD soft Neurovascular intact No cellulitis present Compartment soft Dressing/Incision - clean, dry, no drainage Motor function intact - moving foot and toes well on exam.   Assessment/Plan: 2 Days Post-Op Procedure(s) (LRB): TOTAL KNEE REVISION (Right) KNEE INJECTION (Left)  Past Medical History  Diagnosis Date  . Obesity   . Dyslipidemia   . Coronary artery disease 02-27-11    Dr. Marliss Coots follows-has some blocked coronary arteries ,not suitable for stent  placement  . Hypertension   . Shingles outbreak 02-27-11    2 weeks ago , was tx.-only residual is tenderness of right scalp-no open areas  . GERD (gastroesophageal reflux disease) 02-27-11    Acid reflux  . Hemorrhoids 02-27-11    not bothersome at this time  . Constipation - functional 02-27-11    due to pain meds  . Hearing loss 02-27-11    Bilateral hearing aids-due to exposure to loud machinery  . Urinary incontinence 02-27-11    not an everday occurrence-no special measures  . Osteoarthritis 02-27-11    Ostearthritis-knees, shoulders.Back causes chronic pain-radiates down right leg    Up with therapy D/C IV fluids Plan for discharge tomorrow Discharge home with home health  DVT Prophylaxis - Xarelto  Protocol Weight-Bearing as tolerated to right leg  Steven [name]  V 03/07/2011, 8:01 AM

## 2011-03-07 NOTE — Progress Notes (Signed)
CM consult done. See CM notes in shadow chart.  Bellamia Ferch Wyche RN BSN CCM [phone] 03/07/2011    

## 2011-03-07 NOTE — Progress Notes (Signed)
Physical Therapy Treatment Patient Details Name: ZALE MARCOTTE MRN: [id number] DOB: 12-19-1940 Today's Date: 03/07/2011 1150-1210 1TE PT Assessment/Plan  PT - Assessment/Plan PT Frequency: 7X/week Follow Up Recommendations: Home health PT Equipment Recommended: Rolling walker with 5" wheels PT Goals  Acute Rehab PT Goals PT Goal: Perform Home Exercise Program - Progress: Progressing toward goal  PT Treatment Precautions/Restrictions  Precautions Precautions: Knee Precaution Comments: no pillow under knee Restrictions Weight Bearing Restrictions: No RLE Weight Bearing: Weight bearing as tolerated Mobility (including Balance)      Exercise  Total Joint Exercises Ankle Circles/Pumps: AROM;Both;10 reps Quad Sets: AROM;Right;10 reps Heel Slides: AAROM;Right;10 reps;Supine Hip ABduction/ADduction: AROM;Right;10 reps;Supine Straight Leg Raises: AAROM;10 reps;Supine;Right End of Session PT - End of Session Activity Tolerance: Patient tolerated treatment well Patient left: in chair General Behavior During Session: [facility] for tasks performed Cognition: [facility] for tasks performed  [facility] 03/07/2011, 1:15 PM

## 2011-03-08 LAB — CBC
Hemoglobin: 10.8 g/dL — ABNORMAL LOW (ref 13.0–17.0)
MCHC: 34.6 g/dL (ref 30.0–36.0)
RDW: 13.7 % (ref 11.5–15.5)

## 2011-03-08 NOTE — Progress Notes (Signed)
Physical Therapy Treatment Patient Details Name: Steven [name] MRN: [id number] DOB: 06/04/1940 Today's Date: 03/08/2011 1478-2956 2gt PT Assessment/Plan  PT - Assessment/Plan Comments on Treatment Session: Pt HR before activity=127; during 138; before and after stair trainin 106, remained at 106 for rest of session;pt anxious to D/C today PT Plan: Discharge plan remains appropriate;Frequency remains appropriate PT Frequency: 7X/week Follow Up Recommendations: Home health PT Equipment Recommended: Rolling walker with 5" wheels PT Goals  Acute Rehab PT Goals PT Goal: Supine/Side to Sit - Progress: Met Pt will Transfer Sit to Stand/Stand to Sit: with supervision PT Transfer Goal: Sit to Stand/Stand to Sit - Progress: Met Pt will Ambulate: 51 - 150 feet;with supervision PT Goal: Ambulate - Progress: Met Pt will Go Up / Down Stairs: 1-2 stairs;with least restrictive assistive device;with min assist PT Goal: Up/Down Stairs - Progress: Met  PT Treatment Precautions/Restrictions  Precautions Precautions: Knee Precaution Comments: no pillow under knee Restrictions Weight Bearing Restrictions: No RLE Weight Bearing: Weight bearing as tolerated Mobility (including Balance) Bed Mobility Supine to Sit: 5: Supervision;HOB flat Transfers Sit to Stand: 5: Supervision;From bed;From chair/3-in-1 Sit to Stand Details (indicate cue type and reason): repeated transfer for instrucdtion, cues for hands/safety Stand to Sit: 5: Supervision;To bed;To chair/3-in-1;With upper extremity assist Stand to Sit Details: cues for hands and safety Ambulation/Gait Ambulation/Gait Assistance: 5: Supervision Ambulation/Gait Assistance Details (indicate cue type and reason): cues for breathing and step length Ambulation Distance (Feet): 65 Feet (10') Assistive device: Rolling walker Stairs: Yes Stairs Assistance: 4: Min assist Stairs Assistance Details (indicate cue type and reason): cues for sequence Stair  Management Technique: No rails;With walker;Backwards Number of Stairs: 2     Exercise    End of Session PT - End of Session Equipment Utilized During Treatment: Right knee immobilizer Activity Tolerance: Patient tolerated treatment well Patient left: in chair Nurse Communication:  (PA notified of pt HR) General Behavior During Session: [facility] for tasks performed Cognition: [facility] for tasks performed  [facility] 03/08/2011, 9:56 AM

## 2011-03-08 NOTE — Progress Notes (Signed)
Subjective: 3 Days Post-Op Procedure(s) (LRB): TOTAL KNEE REVISION (Right) KNEE INJECTION (Left) Patient reports pain as 5 on 0-10 scale.    Patient has complaints of some shaking.  Wife is questioning if this is related to medication withdrawal.  Patient also has history of chronic back problems which have been aggravated by CPM and bed.  Objective: Vital signs in last 24 hours: Temp:  [98.1 F (36.7 C)-98.5 F (36.9 C)] 98.4 F (36.9 C) (11/24 0453) Pulse Rate:  [90-101] 101  (11/24 0453) Resp:  [16-18] 18  (11/24 0453) BP: (137-163)/(79-90) 163/90 mmHg (11/24 0453) SpO2:  [92 %-96 %] 92 % (11/24 0453)  Intake/Output from previous day:  Intake/Output Summary (Last 24 hours) at 03/08/11 0758 Last data filed at 03/08/11 0454  Gross per 24 hour  Intake    720 ml  Output   3001 ml  Net  -2281 ml    Intake/Output this shift:    Labs: Results for orders placed during the hospital encounter of 03/05/11  TYPE AND SCREEN      Component Value Range   ABO/RH(D) A POS     Antibody Screen NEG     Sample Expiration 03/08/2011    CBC      Component Value Range   WBC 9.5  4.0 - 10.5 (K/uL)   RBC 3.54 (*) 4.22 - 5.81 (MIL/uL)   Hemoglobin 10.9 (*) 13.0 - 17.0 (g/dL)   HCT 11.9 (*) 14.7 - 52.0 (%)   MCV 91.0  78.0 - 100.0 (fL)   MCH 30.8  26.0 - 34.0 (pg)   MCHC 33.9  30.0 - 36.0 (g/dL)   RDW 82.9  56.2 - 13.0 (%)   Platelets 171  150 - 400 (K/uL)  BASIC METABOLIC PANEL      Component Value Range   Sodium 133 (*) 135 - 145 (mEq/L)   Potassium 4.2  3.5 - 5.1 (mEq/L)   Chloride 100  96 - 112 (mEq/L)   CO2 26  19 - 32 (mEq/L)   Glucose, Bld 145 (*) 70 - 99 (mg/dL)   BUN 20  6 - 23 (mg/dL)   Creatinine, Ser 8.65  0.50 - 1.35 (mg/dL)   Calcium 8.8  8.4 - 78.4 (mg/dL)   GFR calc non Af Amer 87 (*) >90 (mL/min)   GFR calc Af Amer >90  >90 (mL/min)  CBC      Component Value Range   WBC 8.8  4.0 - 10.5 (K/uL)   RBC 3.36 (*) 4.22 - 5.81 (MIL/uL)   Hemoglobin 10.3 (*) 13.0 -  17.0 (g/dL)   HCT 69.6 (*) 29.5 - 52.0 (%)   MCV 91.4  78.0 - 100.0 (fL)   MCH 30.7  26.0 - 34.0 (pg)   MCHC 33.6  30.0 - 36.0 (g/dL)   RDW 28.4  13.2 - 44.0 (%)   Platelets 133 (*) 150 - 400 (K/uL)  BASIC METABOLIC PANEL      Component Value Range   Sodium 134 (*) 135 - 145 (mEq/L)   Potassium 4.1  3.5 - 5.1 (mEq/L)   Chloride 100  96 - 112 (mEq/L)   CO2 26  19 - 32 (mEq/L)   Glucose, Bld 121 (*) 70 - 99 (mg/dL)   BUN 16  6 - 23 (mg/dL)   Creatinine, Ser 1.02  0.50 - 1.35 (mg/dL)   Calcium 8.5  8.4 - 72.5 (mg/dL)   GFR calc non Af Amer 87 (*) >90 (mL/min)   GFR calc Af Amer >  90  >90 (mL/min)  CBC      Component Value Range   WBC 8.4  4.0 - 10.5 (K/uL)   RBC 3.45 (*) 4.22 - 5.81 (MIL/uL)   Hemoglobin 10.8 (*) 13.0 - 17.0 (g/dL)   HCT 91.4 (*) 78.2 - 52.0 (%)   MCV 90.4  78.0 - 100.0 (fL)   MCH 31.3  26.0 - 34.0 (pg)   MCHC 34.6  30.0 - 36.0 (g/dL)   RDW 95.6  21.3 - 08.6 (%)   Platelets 165  150 - 400 (K/uL)    Exam - Neurologically intact Neurovascular intact Sensation intact distally Incision: no drainage Compartment soft Dressing/Incision - clean, dry Motor function intact - moving foot and toes well on exam.   Assessment/Plan: 3 Days Post-Op Procedure(s) (LRB): TOTAL KNEE REVISION (Right) KNEE INJECTION (Left)  Discharge home with home health Past Medical History  Diagnosis Date  . Obesity   . Dyslipidemia   . Coronary artery disease 02-27-11    Dr. Marliss Coots follows-has some blocked coronary arteries ,not suitable for stent  placement  . Hypertension   . Shingles outbreak 02-27-11    2 weeks ago , was tx.-only residual is tenderness of right scalp-no open areas  . GERD (gastroesophageal reflux disease) 02-27-11    Acid reflux  . Hemorrhoids 02-27-11    not bothersome at this time  . Constipation - functional 02-27-11    due to pain meds  . Hearing loss 02-27-11    Bilateral hearing aids-due to exposure to loud machinery  . Urinary  incontinence 02-27-11    not an everday occurrence-no special measures  . Osteoarthritis 02-27-11    Ostearthritis-knees, shoulders.Back causes chronic pain-radiates down right leg    DVT Prophylaxis - Xarelto  Weight-Bearing as tolerated to right leg  Kevan Prouty R. 03/08/2011, 7:58 AM

## 2011-03-08 NOTE — Progress Notes (Signed)
Cm spoke with pt concerning HH needs. Per pt choice [facility] to provide HHPT. On-call representative Kim contacted concerning pt's d/c today. [facility] to deliver RW,3n1, and shower chair to room prior to discharge. Pt wife at bedside.no other needs requested. Spouse and adult children to assist in care.

## 2011-03-10 ENCOUNTER — Encounter (HOSPITAL_COMMUNITY): Payer: Self-pay | Admitting: Orthopedic Surgery

## 2011-03-12 ENCOUNTER — Inpatient Hospital Stay (HOSPITAL_COMMUNITY)
Admission: EM | Admit: 2011-03-12 | Discharge: 2011-03-21 | DRG: 418 | Disposition: A | Discharge status: Home / Self Care | Payer: Medicare Other | Attending: Family Medicine | Admitting: Family Medicine

## 2011-03-12 ENCOUNTER — Encounter (HOSPITAL_COMMUNITY): Payer: Self-pay

## 2011-03-12 ENCOUNTER — Emergency Department (HOSPITAL_COMMUNITY): Payer: Medicare Other

## 2011-03-12 DIAGNOSIS — D649 Anemia, unspecified: Secondary | ICD-10-CM | POA: Diagnosis present

## 2011-03-12 DIAGNOSIS — E7800 Pure hypercholesterolemia, unspecified: Secondary | ICD-10-CM | POA: Diagnosis present

## 2011-03-12 DIAGNOSIS — I4819 Other persistent atrial fibrillation: Secondary | ICD-10-CM | POA: Diagnosis not present

## 2011-03-12 DIAGNOSIS — B029 Zoster without complications: Secondary | ICD-10-CM | POA: Diagnosis present

## 2011-03-12 DIAGNOSIS — D72829 Elevated white blood cell count, unspecified: Secondary | ICD-10-CM | POA: Diagnosis present

## 2011-03-12 DIAGNOSIS — R9431 Abnormal electrocardiogram [ECG] [EKG]: Secondary | ICD-10-CM

## 2011-03-12 DIAGNOSIS — K810 Acute cholecystitis: Principal | ICD-10-CM | POA: Diagnosis present

## 2011-03-12 DIAGNOSIS — Z96659 Presence of unspecified artificial knee joint: Secondary | ICD-10-CM

## 2011-03-12 DIAGNOSIS — I2510 Atherosclerotic heart disease of native coronary artery without angina pectoris: Secondary | ICD-10-CM | POA: Diagnosis present

## 2011-03-12 DIAGNOSIS — E871 Hypo-osmolality and hyponatremia: Secondary | ICD-10-CM | POA: Diagnosis present

## 2011-03-12 DIAGNOSIS — M1710 Unilateral primary osteoarthritis, unspecified knee: Secondary | ICD-10-CM | POA: Diagnosis present

## 2011-03-12 DIAGNOSIS — K4090 Unilateral inguinal hernia, without obstruction or gangrene, not specified as recurrent: Secondary | ICD-10-CM | POA: Diagnosis present

## 2011-03-12 DIAGNOSIS — H9190 Unspecified hearing loss, unspecified ear: Secondary | ICD-10-CM | POA: Diagnosis present

## 2011-03-12 DIAGNOSIS — G8929 Other chronic pain: Secondary | ICD-10-CM | POA: Diagnosis present

## 2011-03-12 DIAGNOSIS — R5381 Other malaise: Secondary | ICD-10-CM | POA: Diagnosis not present

## 2011-03-12 DIAGNOSIS — Z8249 Family history of ischemic heart disease and other diseases of the circulatory system: Secondary | ICD-10-CM

## 2011-03-12 DIAGNOSIS — K219 Gastro-esophageal reflux disease without esophagitis: Secondary | ICD-10-CM | POA: Diagnosis present

## 2011-03-12 DIAGNOSIS — Z6839 Body mass index (BMI) 39.0-39.9, adult: Secondary | ICD-10-CM

## 2011-03-12 DIAGNOSIS — K5900 Constipation, unspecified: Secondary | ICD-10-CM | POA: Diagnosis present

## 2011-03-12 DIAGNOSIS — K649 Unspecified hemorrhoids: Secondary | ICD-10-CM | POA: Diagnosis present

## 2011-03-12 DIAGNOSIS — I4891 Unspecified atrial fibrillation: Secondary | ICD-10-CM

## 2011-03-12 DIAGNOSIS — E876 Hypokalemia: Secondary | ICD-10-CM | POA: Diagnosis present

## 2011-03-12 DIAGNOSIS — T4275XA Adverse effect of unspecified antiepileptic and sedative-hypnotic drugs, initial encounter: Secondary | ICD-10-CM | POA: Diagnosis present

## 2011-03-12 DIAGNOSIS — R0989 Other specified symptoms and signs involving the circulatory and respiratory systems: Secondary | ICD-10-CM

## 2011-03-12 DIAGNOSIS — K430 Incisional hernia with obstruction, without gangrene: Secondary | ICD-10-CM | POA: Diagnosis not present

## 2011-03-12 DIAGNOSIS — I10 Essential (primary) hypertension: Secondary | ICD-10-CM | POA: Diagnosis present

## 2011-03-12 DIAGNOSIS — Z79899 Other long term (current) drug therapy: Secondary | ICD-10-CM

## 2011-03-12 DIAGNOSIS — R188 Other ascites: Secondary | ICD-10-CM | POA: Diagnosis present

## 2011-03-12 DIAGNOSIS — IMO0002 Reserved for concepts with insufficient information to code with codable children: Secondary | ICD-10-CM | POA: Diagnosis present

## 2011-03-12 DIAGNOSIS — R52 Pain, unspecified: Secondary | ICD-10-CM

## 2011-03-12 DIAGNOSIS — K560 Paralytic ileus: Secondary | ICD-10-CM | POA: Diagnosis not present

## 2011-03-12 HISTORY — DX: Other chronic pain: G89.29

## 2011-03-12 LAB — COMPREHENSIVE METABOLIC PANEL
ALT: 20 U/L (ref 0–53)
Alkaline Phosphatase: 94 U/L (ref 39–117)
CO2: 26 mEq/L (ref 19–32)
GFR calc Af Amer: >90 mL/min (ref >90)
GFR calc non Af Amer: >90 mL/min (ref >90)
Glucose, Bld: 164 mg/dL — ABNORMAL HIGH (ref 70–99)
Potassium: 4.2 mEq/L (ref 3.5–5.1)
Sodium: 122 mEq/L — ABNORMAL LOW (ref 135–145)

## 2011-03-12 LAB — DIFFERENTIAL
Lymphocytes Relative: 6 % — ABNORMAL LOW (ref 12–46)
Lymphs Abs: 1 10*3/uL (ref 0.7–4.0)
Neutro Abs: 15.2 10*3/uL — ABNORMAL HIGH (ref 1.7–7.7)
Neutrophils Relative %: 88 % — ABNORMAL HIGH (ref 43–77)

## 2011-03-12 LAB — PROTIME-INR: INR: 1.57 — ABNORMAL HIGH (ref 0.00–1.49)

## 2011-03-12 LAB — CBC
Platelets: 276 10*3/uL (ref 150–400)
RBC: 3.22 MIL/uL — ABNORMAL LOW (ref 4.22–5.81)
WBC: 17.3 10*3/uL — ABNORMAL HIGH (ref 4.0–10.5)

## 2011-03-12 MED ORDER — ONDANSETRON HCL 4 MG PO TABS: 4.0000 mg | ORAL_TABLET | Freq: Four times a day (QID) | ORAL | Status: DC | PRN

## 2011-03-12 MED ORDER — PIPERACILLIN-TAZOBACTAM 3.375 G IVPB
3.3750 g | Freq: Three times a day (TID) | INTRAVENOUS | Status: DC
  Administered 2011-03-12 – 2011-03-21 (×28): 3.375 g via INTRAVENOUS
  Filled 2011-03-12 (×35): qty 50

## 2011-03-12 MED ORDER — ACETAMINOPHEN 650 MG RE SUPP: 650.0000 mg | Freq: Four times a day (QID) | RECTAL | Status: DC | PRN

## 2011-03-12 MED ORDER — PROMETHAZINE HCL 25 MG/ML IJ SOLN: 12.5000 mg | Freq: Four times a day (QID) | INTRAMUSCULAR | Status: DC | PRN

## 2011-03-12 MED ORDER — PROMETHAZINE HCL 25 MG PO TABS: 12.5000 mg | ORAL_TABLET | Freq: Four times a day (QID) | ORAL | Status: DC | PRN

## 2011-03-12 MED ORDER — ONDANSETRON HCL 4 MG/2ML IJ SOLN
4.0000 mg | Freq: Once | INTRAMUSCULAR | Status: AC
  Administered 2011-03-12: 4 mg via INTRAVENOUS
  Filled 2011-03-12: qty 2

## 2011-03-12 MED ORDER — ALBUTEROL SULFATE (5 MG/ML) 0.5% IN NEBU: 2.5000 mg | INHALATION_SOLUTION | RESPIRATORY_TRACT | Status: DC | PRN

## 2011-03-12 MED ORDER — SODIUM CHLORIDE 0.9 % IV SOLN
INTRAVENOUS | Status: DC
  Administered 2011-03-12 – 2011-03-13 (×2): via INTRAVENOUS
  Administered 2011-03-14: 125 mL/h via INTRAVENOUS
  Administered 2011-03-14 – 2011-03-17 (×5): via INTRAVENOUS

## 2011-03-12 MED ORDER — HYDROMORPHONE HCL PF 1 MG/ML IJ SOLN
1.0000 mg | INTRAMUSCULAR | Status: DC | PRN
  Administered 2011-03-12 – 2011-03-13 (×10): 1 mg via INTRAVENOUS
  Filled 2011-03-12 (×10): qty 1

## 2011-03-12 MED ORDER — HYDROMORPHONE HCL PF 1 MG/ML IJ SOLN
1.0000 mg | Freq: Once | INTRAMUSCULAR | Status: AC
  Administered 2011-03-12: 1 mg via INTRAVENOUS
  Filled 2011-03-12: qty 1

## 2011-03-12 MED ORDER — ONDANSETRON HCL 4 MG/2ML IJ SOLN: 4.0000 mg | Freq: Four times a day (QID) | INTRAMUSCULAR | Status: DC | PRN

## 2011-03-12 MED ORDER — ACETAMINOPHEN 325 MG PO TABS: 650.0000 mg | ORAL_TABLET | Freq: Four times a day (QID) | ORAL | Status: DC | PRN

## 2011-03-12 MED ORDER — IOHEXOL 300 MG/ML  SOLN
120.0000 mL | Freq: Once | INTRAMUSCULAR | Status: AC | PRN
  Administered 2011-03-12: 120 mL via INTRAVENOUS

## 2011-03-12 MED ORDER — SODIUM CHLORIDE 0.9 % IV SOLN
3.0000 g | Freq: Once | INTRAVENOUS | Status: AC
  Administered 2011-03-12: 3 g via INTRAVENOUS
  Filled 2011-03-12: qty 3

## 2011-03-12 NOTE — H&P (Signed)
Steven [name] is an 70 y.o. male.    PCP: Dr. Lilyan Punt  Chief Complaint: Abdominal pain. Since yesterday  HPI: This is a 70 year old, Caucasian male, with a past medical history of hypertension, hypercholesterolemia, coronary artery disease, and the recent knee replacement surgery of his right knee, who was in his usual state of health till yesterday midday when he started developing pain in the right upper side of his abdomen. The pain was severe, sharp, radiating to the back. It was 10 out of 10 in intensity at its worst. Patient is on multiple pain medications for chronic right knee pain. None of those medications helped his abdomen and so, he decided to come in to the hospital overnight. He had some nausea, but no emesis. He had a temperature of 62F with chills. Denies any diarrhea. He's been having a poor appetite ever since his knee surgery, which was on November 21. He's been taking all of his medications. He's been on, the diuretic for many years. Currently, after getting pain medications in the ED, he is feeling better.   Prior to Admission medications   Medication Sig Start Date End Date Taking? Authorizing Provider  atorvastatin (LIPITOR) 40 MG tablet Take 40 mg by mouth at bedtime.     Historical Provider, MD  cetirizine (ZYRTEC) 10 MG tablet Take 10 mg by mouth daily.     Historical Provider, MD  HYDROmorphone (DILAUDID) 2 MG tablet Take 1-2 tablets (2-4 mg total) by mouth every 4 (four) hours as needed. 03/07/11 03/17/11  Gus Rankin Aluisio  lisinopril-hydrochlorothiazide (PRINZIDE,ZESTORETIC) 10-12.5 MG per tablet Take 1 tablet by mouth every morning.     Historical Provider, MD  methocarbamol (ROBAXIN) 500 MG tablet Take 1 tablet (500 mg total) by mouth every 6 (six) hours as needed. 03/07/11 03/17/11  Gus Rankin Aluisio  Methylcellulose, Laxative, (CITRUCEL) 500 MG TABS Take 1 tablet by mouth daily.     Historical Provider, MD  morphine (MSIR) 15 MG tablet Take 15 mg by mouth every 6  (six) hours as needed. Pain     Historical Provider, MD  omeprazole (PRILOSEC) 20 MG capsule Take 20 mg by mouth daily.     Historical Provider, MD  polyethylene glycol (MIRALAX) packet Take 17 g by mouth daily as needed. constipation    Historical Provider, MD  rivaroxaban (XARELTO) 10 MG TABS tablet Take 1 tablet (10 mg total) by mouth daily. 03/07/11   Gus Rankin Aluisio  senna-docusate (SENNA PLUS) 8.6-50 MG per tablet Take 1 tablet by mouth 2 (two) times daily as needed. Constipation.  Pt takes one every day but sometimes takes another dose.      Historical Provider, MD    Allergies:  Allergies  Allergen Reactions  . Oxycodone-Acetaminophen Rash and Other (See Comments)    Felt like he was hot    Past Medical History  Diagnosis Date  . Obesity   . Dyslipidemia   . Coronary artery disease 02-27-11    Dr. Marliss Coots follows-has some blocked coronary arteries ,not suitable for stent  placement  . Hypertension   . Shingles outbreak 02-27-11    2 weeks ago , was tx.-only residual is tenderness of right scalp-no open areas  . GERD (gastroesophageal reflux disease) 02-27-11    Acid reflux  . Hemorrhoids 02-27-11    not bothersome at this time  . Constipation - functional 02-27-11    due to pain meds  . Hearing loss 02-27-11    Bilateral hearing aids-due to exposure  to loud machinery  . Urinary incontinence 02-27-11    not an everday occurrence-no special measures  . Osteoarthritis 02-27-11    Ostearthritis-knees, shoulders.Back causes chronic pain-radiates down right leg  . Chronic pain     Past Surgical History  Procedure Date  . Cardiac catheterization 2011    some blockages, no stents planned due to Joint surgery planned  . Replacement total knee 02-27-11    right- 2007  . Total knee revision 03/05/2011    Procedure: TOTAL KNEE REVISION;  Surgeon: Gus Rankin Aluisio;  Location: [facility];  Service: Orthopedics;  Laterality: Right;  . Injection knee 03/05/2011     Procedure: KNEE INJECTION;  Surgeon: Loanne Drilling;  Location: [facility];  Service: Orthopedics;  Laterality: Left;  80 mg depomedrol    Social History:  reports that he has quit smoking. He does not have any smokeless tobacco history on file. He reports that he does not drink alcohol or use illicit drugs.  Family History:  Family History  Problem Relation Age of Onset  . Heart attack Father     mi I N HIS 40'S BUT LIVED INTO HIS 90'S WITH COPD  . Other Mother 28    died multiple med problems  . Heart attack Sister 64    Review of Systems - History obtained from the patient General ROS: negative Psychological ROS: negative ENT ROS: negative Allergy and Immunology ROS: negative Hematological and Lymphatic ROS: negative Respiratory ROS: negative Cardiovascular ROS: negative Gastrointestinal ROS: as in HPI Genito-Urinary ROS: negative Musculoskeletal ROS: positive for - joint pain Neurological ROS: negative Dermatological ROS: negative  Physical Examination Blood pressure 119/77, pulse 106, temperature 98.3 F (36.8 C), temperature source Oral, resp. rate 20, height 5' 10.5" (1.791 m), weight 127.007 kg (280 lb), SpO2 100.00%.  General appearance: alert, cooperative, appears stated age and no distress Head: Normocephalic, without obvious abnormality, atraumatic Eyes: conjunctivae/corneas clear. PERRL, EOM's intact. Fundi benign. Throat: lips, mucosa, and tongue normal; teeth and gums normal Neck: no adenopathy, no carotid bruit, no JVD, supple, symmetrical, trachea midline and thyroid not enlarged, symmetric, no tenderness/mass/nodules Resp: clear to auscultation bilaterally Cardio: regular rate and rhythm, no S3 or S4, systolic murmur: early systolic 2/6, blowing at apex and no rub GI: abnormal findings:  moderate tenderness in the RUQ and no rebound. murphy's sign positive. Sluggish BS. No masses or organomegaly. Extremities: Right leg in cast. Normal otherwise Pulses: 2+ and  symmetric Skin: Skin color, texture, turgor normal. No rashes or lesions Lymph nodes: Cervical, supraclavicular, and axillary nodes normal. Neurologic: Alert and oriented X 3, normal strength and tone. Normal symmetric reflexes. Normal coordination and gait  Results for orders placed during the hospital encounter of 03/12/11 (from the past 48 hour(s))  CBC     Status: Abnormal   Collection Time   03/12/11  3:14 AM      Component Value Range Comment   WBC 17.3 (*) 4.0 - 10.5 (K/uL)    RBC 3.22 (*) 4.22 - 5.81 (MIL/uL)    Hemoglobin 10.2 (*) 13.0 - 17.0 (g/dL)    HCT 16.1 (*) 09.6 - 52.0 (%)    MCV 90.1  78.0 - 100.0 (fL)    MCH 31.7  26.0 - 34.0 (pg)    MCHC 35.2  30.0 - 36.0 (g/dL)    RDW 04.5  40.9 - 81.1 (%)    Platelets 276  150 - 400 (K/uL)   DIFFERENTIAL     Status: Abnormal   Collection Time  03/12/11  3:14 AM      Component Value Range Comment   Neutrophils Relative 88 (*) 43 - 77 (%)    Neutro Abs 15.2 (*) 1.7 - 7.7 (K/uL)    Lymphocytes Relative 6 (*) 12 - 46 (%)    Lymphs Abs 1.0  0.7 - 4.0 (K/uL)    Monocytes Relative 6  3 - 12 (%)    Monocytes Absolute 1.1 (*) 0.1 - 1.0 (K/uL)    Eosinophils Relative 0  0 - 5 (%)    Eosinophils Absolute 0.0  0.0 - 0.7 (K/uL)    Basophils Relative 0  0 - 1 (%)    Basophils Absolute 0.0  0.0 - 0.1 (K/uL)   COMPREHENSIVE METABOLIC PANEL     Status: Abnormal   Collection Time   03/12/11  3:14 AM      Component Value Range Comment   Sodium 122 (*) 135 - 145 (mEq/L)    Potassium 4.2  3.5 - 5.1 (mEq/L)    Chloride 88 (*) 96 - 112 (mEq/L)    CO2 26  19 - 32 (mEq/L)    Glucose, Bld 164 (*) 70 - 99 (mg/dL)    BUN 18  6 - 23 (mg/dL)    Creatinine, Ser 1.61  0.50 - 1.35 (mg/dL)    Calcium 8.8  8.4 - 10.5 (mg/dL)    Total Protein 6.3  6.0 - 8.3 (g/dL)    Albumin 2.6 (*) 3.5 - 5.2 (g/dL)    AST 20  0 - 37 (U/L)    ALT 20  0 - 53 (U/L)    Alkaline Phosphatase 94  39 - 117 (U/L)    Total Bilirubin 0.9  0.3 - 1.2 (mg/dL)    GFR calc non  Af Amer >90  >90 (mL/min)    GFR calc Af Amer >90  >90 (mL/min)   LIPASE, BLOOD     Status: Normal   Collection Time   03/12/11  3:14 AM      Component Value Range Comment   Lipase 11  11 - 59 (U/L)    Ct Abdomen Pelvis W Contrast  03/12/2011  *RADIOLOGY REPORT*  Clinical Data: Right lower quadrant abdominal pain, radiating to the back.  Leukocytosis.  CT ABDOMEN AND PELVIS WITH CONTRAST  Technique:  Multidetector CT imaging of the abdomen and pelvis was performed following the standard protocol during bolus administration of intravenous contrast.  Contrast: OMNIPAQUE IOHEXOL 300 MG/ML IV SOLN  Comparison: None.  Findings: Bibasilar atelectasis is noted.  The heart is mildly enlarged.  There is diffuse soft tissue stranding noted about the distended gallbladder, compatible with acute cholecystitis.  Trace associated pericholecystic fluid is noted, with trace fluid tracking about the liver. Soft tissue inflammation extends about the proximal duodenum and pancreatic head, though the inflammatory process appears centered about the gallbladder.  Minimal soft tissue stranding is noted along Gerota's fascia on the right side, with trace associated fluid.  The liver is otherwise unremarkable in appearance.  There is no evidence for significant intrahepatic biliary duct dilatation.  The spleen is within normal limits.  Two small calcifications at the body of the pancreas could reflect sequelae of prior pancreatitis. The adrenal glands are unremarkable in appearance.  A small 5 mm hypodensity in the interpole region of the left kidney likely reflects a small cyst.  Nonspecific perinephric stranding and trace fluid are noted bilaterally.  This is more prominent at the left kidney.  The kidneys are otherwise unremarkable  in appearance.  There is no evidence of hydronephrosis.  No renal or ureteral stones are seen.  The small bowel is unremarkable in appearance.  The stomach is filled with contrast and is within  normal limits.  No acute vascular abnormalities are seen.  Scattered calcification is noted along the abdominal aorta and its branches, particularly prominent about the origin of the left renal artery.  The appendix is filled with air and is unremarkable in appearance, without evidence for appendicitis.  Mild soft tissue inflammation about the hepatic flexure of the colon reflects the adjacent right upper quadrant process.  Minimal diverticulosis is noted along the distal descending colon, and diverticulosis is seen along the proximal sigmoid colon, without evidence of diverticulitis.  There is apparent left-sided nonspecific wall thickening at the rectum; this is thought to reflect relative decompression, without a definite mass.  The bladder is mildly distended and grossly unremarkable in appearance.  Minimal calcification is noted within the prostate; the prostate remains normal in size.  Trace free fluid within the pelvis likely reflects the inflammatory process at the right upper quadrant.  No inguinal lymphadenopathy is seen.  No acute osseous abnormalities are identified.  IMPRESSION:  1.  Acute cholecystitis noted, with diffuse soft tissue inflammation about the mildly distended gallbladder, and trace associated pericholecystic fluid.  Trace fluid tracks about the liver.  Soft tissue inflammation involves the proximal duodenum and pancreatic head, though the inflammatory process appears centered at the gallbladder. 2.  Tiny calcifications at the body of the pancreas could reflect sequelae of remote pancreatitis; pancreas otherwise unremarkable in appearance. 3.  Nonspecific bilateral perinephric stranding and trace fluid, more prominent at the left kidney; small left renal cyst noted. 4.  Scattered calcification along the abdominal aorta and its branches, preclude prominent about the origin of the left kidney. 5.  Minimal diverticulosis along the distal descending colon, and diverticulosis along the proximal  sigmoid colon, without evidence of diverticulitis. 6.  Trace free fluid within the pelvis, likely reflecting the inflammatory process at the right upper quadrant. 7.  Bibasilar atelectasis noted. 8.  Mild cardiomegaly.  Original Report Authenticated By: Tonia Ghent, M.D.     Assessment/Plan  Principal Problem:  *Hyponatremia Active Problems:  CORONARY ATHEROSCLEROSIS NATIVE CORONARY ARTERY  HTN (hypertension)  Hypercholesteremia  Chronic knee pain  Acute cholecystitis  Anemia   #1 hyponatremia: Etiology is unclear. Probably has something to do with his poor by mouth intake over the last one week along with the fact that he is on diuretics. We will check a urine osmolality and urine sodium. We'll give normal saline with which his sodium level should improve.  #2 on oral anticoagulation with Rivaroxaban: This will need to be stopped before the patient can undergo surgery. The half life of this drug is about 10 hours. The surgery can be planned accordingly.  #3 acute cholecystitis. He'll be put on Zosyn intravenously. His sodium levels will need to be corrected before the patient can undergo anesthesia. Dr. Lovell Sheehan has been notified and he will see the patient this morning.  #4 coronary artery disease: This is based on a cardiac catheterization done in 2007. He was medically managed. He was cleared for his knee surgery just recently. This issue remains stable.  #5 chronic right knee pain: He is on narcotics chronically at home. So, he may require higher than normal dosage of narcotics in the hospital.  #6 history of hypertension: Continue to monitor blood pressures closely.  #7 he'll be kept n.p.o.  #  8 anemia: We'll check an anemia panel.  DVT, prophylaxis with SCDs.  Further management decisions will depend on results of further testing and patient's response to treatment.   Hanne Kegg 03/12/2011, 6:16 AM

## 2011-03-12 NOTE — ED Notes (Signed)
MD at bedside. 

## 2011-03-12 NOTE — Consult Note (Signed)
ANTIBIOTIC CONSULT NOTE   Pharmacy Consult for Zosyn Indication: abdominal pain, cholecystitis  Allergies  Allergen Reactions  . Oxycodone-Acetaminophen Rash and Other (See Comments)    Felt like he was hot   Patient Measurements: Height: 5' 10.5" (179.1 cm) Weight: 280 lb (127.007 kg) IBW/kg (Calculated) : 74.15   Vital Signs: Temp: 98.3 F (36.8 C) (11/28 0218) Temp src: Oral (11/28 0218) BP: 119/77 mmHg (11/28 0552) Pulse Rate: 106  (11/28 0552) Intake/Output from previous day:   Intake/Output from this shift:    Labs:  [facility] 03/12/11 0314  WBC 17.3*  HGB 10.2*  PLT 276  LABCREA --  CREATININE 0.75   Estimated Creatinine Clearance: 117.5 ml/min (by C-G formula based on Cr of 0.75). No results found for this basename: VANCOTROUGH:2,VANCOPEAK:2,VANCORANDOM:2,GENTTROUGH:2,GENTPEAK:2,GENTRANDOM:2,TOBRATROUGH:2,TOBRAPEAK:2,TOBRARND:2,AMIKACINPEAK:2,AMIKACINTROU:2,AMIKACIN:2, in the last 72 hours   Microbiology: Recent Results (from the past 720 hour(s))  SURGICAL PCR SCREEN     Status: Normal   Collection Time   02/27/11 10:40 AM      Component Value Range Status Comment   MRSA, PCR NEGATIVE  NEGATIVE  Final    Staphylococcus aureus NEGATIVE  NEGATIVE  Final     Anti-infectives     Start     Dose/Rate Route Frequency Ordered Stop   03/12/11 0900  piperacillin-tazobactam (ZOSYN) IVPB 3.375 g       3.375 g 12.5 mL/hr over 240 Minutes Intravenous Every 8 hours 03/12/11 0741     03/12/11 0545   Ampicillin-Sulbactam (UNASYN) 3 g in sodium chloride 0.9 % 100 mL IVPB        3 g 100 mL/hr over 60 Minutes Intravenous  Once 03/12/11 0541 03/12/11 0653         Assessment: Good renal fxn  Goal of Therapy:  Eradicate infection.  Plan:  Zosyn 3.375gm iv q8hrs Labs per protocol  Valrie Hart A 03/12/2011,7:42 AM

## 2011-03-12 NOTE — Progress Notes (Addendum)
Subjective: This man has been admitted with acute cholecystitis. Also, he has hyponatremia, likely due to poor by mouth intake in the last week or so associated with diuretics and ACE inhibitors that he takes for his hypertension. He is in pain. He needs to see a Careers adviser. The patient and family strongly feel that they would like to see a Careers adviser in [place] where his  other doctors are.           Physical Exam: Blood pressure 119/77, pulse 106, temperature 98.3 F (36.8 C), temperature source Oral, resp. rate 20, height 5' 10.5" (1.791 m), weight 127.007 kg (280 lb), SpO2 100.00%. He is in pain. He is not toxic or septic. Heart sounds are present and in sinus rhythm. There is no gallop rhythm. There are no murmurs. Lung fields are clear. Abdomen is soft but tender in the right upper quadrant. He is alert and orientated without any focal neurological signs.    Investigations:     Basic Metabolic Panel:  Basename 03/12/11 0314  NA 122*  K 4.2  CL 88*  CO2 26  GLUCOSE 164*  BUN 18  CREATININE 0.75  CALCIUM 8.8  MG --  PHOS --   Liver Function Tests:  [facility] 03/12/11 0314  AST 20  ALT 20  ALKPHOS 94  BILITOT 0.9  PROT 6.3  ALBUMIN 2.6*     CBC:  Basename 03/12/11 0314  WBC 17.3*  NEUTROABS 15.2*  HGB 10.2*  HCT 29.0*  MCV 90.1  PLT 276    Ct Abdomen Pelvis W Contrast  03/12/2011  *RADIOLOGY REPORT*  Clinical Data: Right lower quadrant abdominal pain, radiating to the back.  Leukocytosis.  CT ABDOMEN AND PELVIS WITH CONTRAST  Technique:  Multidetector CT imaging of the abdomen and pelvis was performed following the standard protocol during bolus administration of intravenous contrast.  Contrast: OMNIPAQUE IOHEXOL 300 MG/ML IV SOLN  Comparison: None.  Findings: Bibasilar atelectasis is noted.  The heart is mildly enlarged.  There is diffuse soft tissue stranding noted about the distended gallbladder, compatible with acute cholecystitis.  Trace  associated pericholecystic fluid is noted, with trace fluid tracking about the liver. Soft tissue inflammation extends about the proximal duodenum and pancreatic head, though the inflammatory process appears centered about the gallbladder.  Minimal soft tissue stranding is noted along Gerota's fascia on the right side, with trace associated fluid.  The liver is otherwise unremarkable in appearance.  There is no evidence for significant intrahepatic biliary duct dilatation.  The spleen is within normal limits.  Two small calcifications at the body of the pancreas could reflect sequelae of prior pancreatitis. The adrenal glands are unremarkable in appearance.  A small 5 mm hypodensity in the interpole region of the left kidney likely reflects a small cyst.  Nonspecific perinephric stranding and trace fluid are noted bilaterally.  This is more prominent at the left kidney.  The kidneys are otherwise unremarkable in appearance.  There is no evidence of hydronephrosis.  No renal or ureteral stones are seen.  The small bowel is unremarkable in appearance.  The stomach is filled with contrast and is within normal limits.  No acute vascular abnormalities are seen.  Scattered calcification is noted along the abdominal aorta and its branches, particularly prominent about the origin of the left renal artery.  The appendix is filled with air and is unremarkable in appearance, without evidence for appendicitis.  Mild soft tissue inflammation about the hepatic flexure of the colon reflects the adjacent  right upper quadrant process.  Minimal diverticulosis is noted along the distal descending colon, and diverticulosis is seen along the proximal sigmoid colon, without evidence of diverticulitis.  There is apparent left-sided nonspecific wall thickening at the rectum; this is thought to reflect relative decompression, without a definite mass.  The bladder is mildly distended and grossly unremarkable in appearance.  Minimal  calcification is noted within the prostate; the prostate remains normal in size.  Trace free fluid within the pelvis likely reflects the inflammatory process at the right upper quadrant.  No inguinal lymphadenopathy is seen.  No acute osseous abnormalities are identified.  IMPRESSION:  1.  Acute cholecystitis noted, with diffuse soft tissue inflammation about the mildly distended gallbladder, and trace associated pericholecystic fluid.  Trace fluid tracks about the liver.  Soft tissue inflammation involves the proximal duodenum and pancreatic head, though the inflammatory process appears centered at the gallbladder. 2.  Tiny calcifications at the body of the pancreas could reflect sequelae of remote pancreatitis; pancreas otherwise unremarkable in appearance. 3.  Nonspecific bilateral perinephric stranding and trace fluid, more prominent at the left kidney; small left renal cyst noted. 4.  Scattered calcification along the abdominal aorta and its branches, preclude prominent about the origin of the left kidney. 5.  Minimal diverticulosis along the distal descending colon, and diverticulosis along the proximal sigmoid colon, without evidence of diverticulitis. 6.  Trace free fluid within the pelvis, likely reflecting the inflammatory process at the right upper quadrant. 7.  Bibasilar atelectasis noted. 8.  Mild cardiomegaly.  Original Report Authenticated By: Tonia Ghent, M.D.      Medications: I have reviewed the patient's current medications.  Impression:  1. Acute cholecystitis. 2. Hyponatremia. 3. Hypertension. 4. Status post recent right total knee replacement revision. 5. Coronary artery disease, stable.      Plan: 1. Continue with intravenous antibiotics and n.p.o. 2. I will speak with my colleagues in [place]  So that this patient can be transferred to [facility] which is the patient's choice.  I've spoken with Dr. Gasper Sells, my colleague at [facility], who will  accept this patient in transfer.     LOS: 0 days   GOSRANI,NIMISH C 03/12/2011, 2:31 PM

## 2011-03-12 NOTE — ED Provider Notes (Signed)
History     CSN: [id number] Arrival date & time: 03/12/2011  2:22 AM   First MD Initiated Contact with Patient 03/12/11 0236      Chief Complaint  Patient presents with  . Abdominal Pain    (Consider location/radiation/quality/duration/timing/severity/associated sxs/prior treatment) Patient is a 70 y.o. male presenting with abdominal pain.  Abdominal Pain The primary symptoms of the illness include abdominal pain. The primary symptoms of the illness do not include fever, shortness of breath, nausea, vomiting, diarrhea or dysuria.  Symptoms associated with the illness do not include chills, constipation or hematuria.   the patient is a 70 year old male, with a history of coronary artery disease, and recent right knee replacement, who presents to the emergency department with right upper abdominal pain that radiates to his back.  Since around noontime yesterday.  He denies nausea, vomiting, fevers, chills, diarrhea, or blood in his urine.  He denies respiratory symptoms.  He has never had abdominal surgery.  In the past.  Past Medical History  Diagnosis Date  . Obesity   . Dyslipidemia   . Coronary artery disease 02-27-11    Dr. Marliss Coots follows-has some blocked coronary arteries ,not suitable for stent  placement  . Hypertension   . Shingles outbreak 02-27-11    2 weeks ago , was tx.-only residual is tenderness of right scalp-no open areas  . GERD (gastroesophageal reflux disease) 02-27-11    Acid reflux  . Hemorrhoids 02-27-11    not bothersome at this time  . Constipation - functional 02-27-11    due to pain meds  . Hearing loss 02-27-11    Bilateral hearing aids-due to exposure to loud machinery  . Urinary incontinence 02-27-11    not an everday occurrence-no special measures  . Osteoarthritis 02-27-11    Ostearthritis-knees, shoulders.Back causes chronic pain-radiates down right leg  . Chronic pain     Past Surgical History  Procedure Date  . Cardiac  catheterization 2011    some blockages, no stents planned due to Joint surgery planned  . Replacement total knee 02-27-11    right- 2007  . Total knee revision 03/05/2011    Procedure: TOTAL KNEE REVISION;  Surgeon: Gus Rankin Aluisio;  Location: [facility];  Service: Orthopedics;  Laterality: Right;  . Injection knee 03/05/2011    Procedure: KNEE INJECTION;  Surgeon: Loanne Drilling;  Location: [facility];  Service: Orthopedics;  Laterality: Left;  80 mg depomedrol    Family History  Problem Relation Age of Onset  . Heart attack Father     mi I N HIS 40'S BUT LIVED INTO HIS 90'S WITH COPD  . Other Mother 27    died multiple med problems  . Heart attack Sister 87    History  Substance Use Topics  . Smoking status: Former Games developer  . Smokeless tobacco: Not on file  . Alcohol Use: No      Review of Systems  Constitutional: Negative for fever and chills.  HENT: Negative for congestion.   Eyes: Negative for redness.  Respiratory: Negative for cough and shortness of breath.   Cardiovascular: Positive for palpitations. Negative for chest pain.  Gastrointestinal: Positive for abdominal pain. Negative for nausea, vomiting, diarrhea and constipation.  Genitourinary: Negative for dysuria and hematuria.  Skin: Negative for rash.  Neurological: Negative for headaches.  Psychiatric/Behavioral: Negative for confusion.    Allergies  Oxycodone-acetaminophen  Home Medications   Current Outpatient Rx  Name Route Sig Dispense Refill  . ATORVASTATIN CALCIUM 40 MG PO  TABS Oral Take 40 mg by mouth at bedtime.     Marland Kitchen CETIRIZINE HCL 10 MG PO TABS Oral Take 10 mg by mouth daily.     Marland Kitchen HYDROMORPHONE HCL 2 MG PO TABS Oral Take 1-2 tablets (2-4 mg total) by mouth every 4 (four) hours as needed. 80 tablet 0  . LISINOPRIL-HYDROCHLOROTHIAZIDE 10-12.5 MG PO TABS Oral Take 1 tablet by mouth every morning.     Marland Kitchen METHOCARBAMOL 500 MG PO TABS Oral Take 1 tablet (500 mg total) by mouth every 6 (six) hours as  needed. 60 tablet 1  . METHYLCELLULOSE (LAXATIVE) 500 MG PO TABS Oral Take 1 tablet by mouth daily.     . MORPHINE SULFATE 15 MG PO TABS Oral Take 15 mg by mouth every 6 (six) hours as needed. Pain     . OMEPRAZOLE 20 MG PO CPDR Oral Take 20 mg by mouth daily.     Marland Kitchen POLYETHYLENE GLYCOL 3350 PO PACK Oral Take 17 g by mouth daily as needed. constipation    . RIVAROXABAN 10 MG PO TABS Oral Take 1 tablet (10 mg total) by mouth daily. 17 tablet 0  . SENNOSIDES-DOCUSATE SODIUM 8.6-50 MG PO TABS Oral Take 1 tablet by mouth 2 (two) times daily as needed. Constipation.  Pt takes one every day but sometimes takes another dose.        BP 142/100  Pulse 99  Temp(Src) 98.3 F (36.8 C) (Oral)  Resp 20  Ht 5' 10.5" (1.791 m)  Wt 280 lb (127.007 kg)  BMI 39.61 kg/m2  SpO2 100%  Physical Exam  Constitutional: He is oriented to person, place, and time. No distress.       Obese  HENT:  Head: Normocephalic and atraumatic.  Eyes: EOM are normal. Pupils are equal, round, and reactive to light. No scleral icterus.  Neck: Normal range of motion. Neck supple.  Cardiovascular: Normal rate, regular rhythm and normal heart sounds.   No murmur heard. Pulmonary/Chest: Effort normal and breath sounds normal. No respiratory distress. He has no wheezes. He has no rales.  Abdominal: Soft. Bowel sounds are normal. He exhibits no distension and no mass. There is tenderness. There is no rebound and no guarding.       Right upper quadrant tenderness  Musculoskeletal: Normal range of motion. He exhibits no edema and no tenderness.  Neurological: He is alert and oriented to person, place, and time. No cranial nerve deficit.  Skin: Skin is warm and dry. He is not diaphoretic.  Psychiatric: He has a normal mood and affect. His behavior is normal.    ED Course  Procedures (including critical care time)  70 year old male, with right upper abdominal pain, which radiates  to his back.  It started yesterday.  And has  been constant.  He has no history of abdominal surgery.  In the past.  We will perform laboratory testing, and a CAT scan, my concern is that he has either cholelithiasis, cholecystitis or appendicitis, and not able to get an ultrasound at this time.  In the morning.  Therefore, we will perform a CAT scan instead of an ultrasound.   Labs Reviewed  CBC  DIFFERENTIAL  COMPREHENSIVE METABOLIC PANEL  LIPASE, BLOOD   5:44 AM I spoke with Dr. Lovell Sheehan.  He said he will consult. He cannot take pt to the OR with Na+ = 122.   He requested medical admission.    I spoke  With Dr. Rito Ehrlich.  He will admit and correct  the sodium so pt can go to OR  I started unasyn for acute cholecystitis.  CRITICAL CARE Performed by: Nicholes Stairs   Total critical care time: 30 min  Critical care time was exclusive of separately billable procedures and treating other patients.  Critical care was necessary to treat or prevent imminent or life-threatening deterioration.  Critical care was time spent personally by me on the following activities: development of treatment plan with patient and/or surrogate as well as nursing, discussions with consultants, evaluation of patient's response to treatment, examination of patient, obtaining history from patient or surrogate, ordering and performing treatments and interventions, ordering and review of laboratory studies, ordering and review of radiographic studies, pulse oximetry and re-evaluation of patient's condition.     MDM  Abdominal pain Acute cholecystitis Leukocytosis Profound hyponatremia       Nicholes Stairs, MD 03/12/11 [phone]

## 2011-03-12 NOTE — ED Notes (Signed)
Left lower quad ab pain x2 days, also "kidneys not working right"

## 2011-03-12 NOTE — ED Notes (Signed)
Patient transported to CT 

## 2011-03-13 ENCOUNTER — Encounter (HOSPITAL_COMMUNITY): Payer: Self-pay | Admitting: General Surgery

## 2011-03-13 DIAGNOSIS — I2510 Atherosclerotic heart disease of native coronary artery without angina pectoris: Secondary | ICD-10-CM

## 2011-03-13 DIAGNOSIS — R1011 Right upper quadrant pain: Secondary | ICD-10-CM

## 2011-03-13 DIAGNOSIS — K8000 Calculus of gallbladder with acute cholecystitis without obstruction: Secondary | ICD-10-CM

## 2011-03-13 DIAGNOSIS — D72828 Other elevated white blood cell count: Secondary | ICD-10-CM

## 2011-03-13 DIAGNOSIS — R112 Nausea with vomiting, unspecified: Secondary | ICD-10-CM

## 2011-03-13 DIAGNOSIS — Z01810 Encounter for preprocedural cardiovascular examination: Secondary | ICD-10-CM

## 2011-03-13 LAB — BASIC METABOLIC PANEL
BUN: 22 mg/dL (ref 6–23)
CO2: 28 mEq/L (ref 19–32)
CO2: 28 mEq/L (ref 19–32)
Calcium: 8.7 mg/dL (ref 8.4–10.5)
Calcium: 8.5 mg/dL (ref 8.4–10.5)
Chloride: 87 mEq/L — ABNORMAL LOW (ref 96–112)
GFR calc non Af Amer: 83 mL/min — ABNORMAL LOW (ref >90)
Glucose, Bld: 131 mg/dL — ABNORMAL HIGH (ref 70–99)
Glucose, Bld: 121 mg/dL — ABNORMAL HIGH (ref 70–99)
Potassium: 4.1 mEq/L (ref 3.5–5.1)
Sodium: 123 mEq/L — ABNORMAL LOW (ref 135–145)

## 2011-03-13 MED ORDER — HYDROMORPHONE HCL PF 1 MG/ML IJ SOLN
0.5000 mg | INTRAMUSCULAR | Status: DC | PRN
  Administered 2011-03-13: 2 mg via INTRAVENOUS
  Administered 2011-03-13: 1 mg via INTRAVENOUS
  Administered 2011-03-13: 2 mg via INTRAVENOUS
  Administered 2011-03-13 – 2011-03-14 (×5): 1 mg via INTRAVENOUS
  Administered 2011-03-14: 2 mg via INTRAVENOUS
  Administered 2011-03-14: 1 mg via INTRAVENOUS
  Administered 2011-03-15: 2 mg via INTRAVENOUS
  Administered 2011-03-17 (×2): 1 mg via INTRAVENOUS
  Administered 2011-03-19 (×2): 0.5 mg via INTRAVENOUS
  Filled 2011-03-13 (×2): qty 2
  Filled 2011-03-13 (×6): qty 1
  Filled 2011-03-13: qty 2
  Filled 2011-03-13: qty 1
  Filled 2011-03-13: qty 2
  Filled 2011-03-13 (×5): qty 1

## 2011-03-13 NOTE — Discharge Summary (Signed)
Physician Discharge Summary   Patient ID: Steven [name] MRN: [id number] DOB/AGE: [birth year] 70 y.o.  Admit date: 03/05/2011 Discharge date: 02/27/2011  Primary Diagnosis: Unstable right total knee arthroplasty  Admission Diagnoses: Past Medical History  Diagnosis Date  . Obesity   . Dyslipidemia   . Coronary artery disease 02-27-11    Dr. Marliss Coots follows-has some blocked coronary arteries ,not suitable for stent  placement  . Hypertension   . Shingles outbreak 02-27-11    2 weeks ago , was tx.-only residual is tenderness of right scalp-no open areas  . GERD (gastroesophageal reflux disease) 02-27-11    Acid reflux  . Hemorrhoids 02-27-11    not bothersome at this time  . Hearing loss 02-27-11    Bilateral hearing aids-due to exposure to loud machinery  . Urinary incontinence 02-27-11    not an everday occurrence-no special measures  . Osteoarthritis 02-27-11    Ostearthritis-knees, shoulders.Back causes chronic pain-radiates down right leg  . Chronic pain     Discharge Diagnoses:  Active Problems:  * No active hospital problems. *  Postop Hponatremia  Procedure: Procedure(s) (LRB): TOTAL KNEE REVISION (Right) KNEE INJECTION (Left)   Consults: none  HPI:  Steven [name] is a 70 year old male who had a right  total knee arthroplasty done approximately 3 years ago. He has had  significant instability and pain. He has had an infection workup, which has been negative. He has a grossly unstable knee. He presents now for total knee arthroplasty revision.   Laboratory Data: Results for orders placed during the hospital encounter of 03/05/11  TYPE AND SCREEN      Component Value Range   ABO/RH(D) A POS     Antibody Screen NEG     Sample Expiration 03/08/2011    CBC      Component Value Range   WBC 9.5  4.0 - 10.5 (K/uL)   RBC 3.54 (*) 4.22 - 5.81 (MIL/uL)   Hemoglobin 10.9 (*) 13.0 - 17.0 (g/dL)   HCT 40.9 (*) 81.1 - 52.0 (%)   MCV 91.0  78.0 - 100.0 (fL)     MCH 30.8  26.0 - 34.0 (pg)   MCHC 33.9  30.0 - 36.0 (g/dL)   RDW 91.4  78.2 - 95.6 (%)   Platelets 171  150 - 400 (K/uL)  BASIC METABOLIC PANEL      Component Value Range   Sodium 133 (*) 135 - 145 (mEq/L)   Potassium 4.2  3.5 - 5.1 (mEq/L)   Chloride 100  96 - 112 (mEq/L)   CO2 26  19 - 32 (mEq/L)   Glucose, Bld 145 (*) 70 - 99 (mg/dL)   BUN 20  6 - 23 (mg/dL)   Creatinine, Ser 2.13  0.50 - 1.35 (mg/dL)   Calcium 8.8  8.4 - 08.6 (mg/dL)   GFR calc non Af Amer 87 (*) >90 (mL/min)   GFR calc Af Amer >90  >90 (mL/min)  CBC      Component Value Range   WBC 8.8  4.0 - 10.5 (K/uL)   RBC 3.36 (*) 4.22 - 5.81 (MIL/uL)   Hemoglobin 10.3 (*) 13.0 - 17.0 (g/dL)   HCT 57.8 (*) 46.9 - 52.0 (%)   MCV 91.4  78.0 - 100.0 (fL)   MCH 30.7  26.0 - 34.0 (pg)   MCHC 33.6  30.0 - 36.0 (g/dL)   RDW 62.9  52.8 - 41.3 (%)   Platelets 133 (*) 150 - 400 (K/uL)  BASIC METABOLIC PANEL  Component Value Range   Sodium 134 (*) 135 - 145 (mEq/L)   Potassium 4.1  3.5 - 5.1 (mEq/L)   Chloride 100  96 - 112 (mEq/L)   CO2 26  19 - 32 (mEq/L)   Glucose, Bld 121 (*) 70 - 99 (mg/dL)   BUN 16  6 - 23 (mg/dL)   Creatinine, Ser 2.13  0.50 - 1.35 (mg/dL)   Calcium 8.5  8.4 - 08.6 (mg/dL)   GFR calc non Af Amer 87 (*) >90 (mL/min)   GFR calc Af Amer >90  >90 (mL/min)  CBC      Component Value Range   WBC 8.4  4.0 - 10.5 (K/uL)   RBC 3.45 (*) 4.22 - 5.81 (MIL/uL)   Hemoglobin 10.8 (*) 13.0 - 17.0 (g/dL)   HCT 57.8 (*) 46.9 - 52.0 (%)   MCV 90.4  78.0 - 100.0 (fL)   MCH 31.3  26.0 - 34.0 (pg)   MCHC 34.6  30.0 - 36.0 (g/dL)   RDW 62.9  52.8 - 41.3 (%)   Platelets 165  150 - 400 (K/uL)    Appointment on 02/27/2011  Component Date Value Range Status  . aPTT (seconds) 02/27/2011 35  24-37 Final  . WBC (K/uL) 02/27/2011 6.1  4.0-10.5 Final  . RBC (MIL/uL) 02/27/2011 4.55  4.22-5.81 Final  . Hemoglobin (g/dL) 24/40/1027 25.3  66.4-40.3 Final  . HCT (%) 02/27/2011 42.1  39.0-52.0 Final  . MCV (fL)  02/27/2011 92.5  78.0-100.0 Final  . MCH (pg) 02/27/2011 31.0  26.0-34.0 Final  . MCHC (g/dL) 47/42/5956 38.7  56.4-33.2 Final  . RDW (%) 02/27/2011 13.7  11.5-15.5 Final  . Platelets (K/uL) 02/27/2011 216  150-400 Final  . Sodium (mEq/L) 02/27/2011 136  135-145 Final  . Potassium (mEq/L) 02/27/2011 4.4  3.5-5.1 Final  . Chloride (mEq/L) 02/27/2011 99  96-112 Final  . CO2 (mEq/L) 02/27/2011 29  19-32 Final  . Glucose, Bld (mg/dL) 95/18/8416 93  60-63 Final  . BUN (mg/dL) 01/60/1093 15  2-35 Final  . Creatinine, Ser (mg/dL) 57/32/2025 4.27  0.62-3.76 Final  . Calcium (mg/dL) 28/31/5176 9.7  1.6-07.3 Final  . Total Protein (g/dL) 71/09/2692 6.9  8.5-4.6 Final  . Albumin (g/dL) 27/06/5007 3.6  3.8-1.8 Final  . AST (U/L) 02/27/2011 17  0-37 Final  . ALT (U/L) 02/27/2011 13  0-53 Final  . Alkaline Phosphatase (U/L) 02/27/2011 78  39-117 Final  . Total Bilirubin (mg/dL) 29/93/7169 0.6  6.7-8.9 Final  . GFR calc non Af Amer (mL/min) 02/27/2011 83* >90 Final  . GFR calc Af Amer (mL/min) 02/27/2011 >90  >90 Final   Comment:                                 The eGFR has been calculated                          using the CKD EPI equation.                          This calculation has not been                          validated in all clinical  situations.                          eGFR's persistently                          <90 mL/min signify                          possible Chronic Kidney Disease.  Marland Kitchen Prothrombin Time (seconds) 02/27/2011 13.5  11.6-15.2 Final  . INR  02/27/2011 1.01  0.00-1.49 Final  . Color, Urine  02/27/2011 YELLOW  YELLOW Final  . APPearance  02/27/2011 CLEAR  CLEAR Final  . Specific Gravity, Urine  02/27/2011 1.013  1.005-1.030 Final  . pH  02/27/2011 7.5  5.0-8.0 Final  . Glucose, UA (mg/dL) 91/47/8295 NEGATIVE  NEGATIVE Final  . Hgb urine dipstick  02/27/2011 NEGATIVE  NEGATIVE Final  . Bilirubin Urine  02/27/2011 NEGATIVE  NEGATIVE Final  .  Ketones, ur (mg/dL) 62/13/0865 NEGATIVE  NEGATIVE Final  . Protein, ur (mg/dL) 78/46/9629 NEGATIVE  NEGATIVE Final  . Urobilinogen, UA (mg/dL) 52/84/1324 0.2  4.0-1.0 Final  . Nitrite  02/27/2011 NEGATIVE  NEGATIVE Final  . Leukocytes, UA  02/27/2011 NEGATIVE  NEGATIVE Final   MICROSCOPIC NOT DONE ON URINES WITH NEGATIVE PROTEIN, BLOOD, LEUKOCYTES, NITRITE, OR GLUCOSE <1000 mg/dL.  Marland Kitchen MRSA, PCR  02/27/2011 NEGATIVE  NEGATIVE Final  . Staphylococcus aureus  02/27/2011 NEGATIVE  NEGATIVE Final   Comment:                                 The Xpert SA Assay (FDA                          approved for NASAL specimens                          only), is one component of                          a comprehensive surveillance                          program.  It is not intended                          to diagnose infection nor to                          guide or monitor treatment.    X-Rays:Ct Abdomen Pelvis W Contrast  03/12/2011  *RADIOLOGY REPORT*  Clinical Data: Right lower quadrant abdominal pain, radiating to the back.  Leukocytosis.  CT ABDOMEN AND PELVIS WITH CONTRAST  Technique:  Multidetector CT imaging of the abdomen and pelvis was performed following the standard protocol during bolus administration of intravenous contrast.  Contrast: OMNIPAQUE IOHEXOL 300 MG/ML IV SOLN  Comparison: None.  Findings: Bibasilar atelectasis is noted.  The heart is mildly enlarged.  There is diffuse soft tissue stranding noted about the distended gallbladder, compatible with acute cholecystitis.  Trace associated pericholecystic fluid is noted, with trace fluid tracking about the liver. Soft tissue inflammation extends about the proximal duodenum and pancreatic head,  though the inflammatory process appears centered about the gallbladder.  Minimal soft tissue stranding is noted along Gerota's fascia on the right side, with trace associated fluid.  The liver is otherwise unremarkable in appearance.  There is no  evidence for significant intrahepatic biliary duct dilatation.  The spleen is within normal limits.  Two small calcifications at the body of the pancreas could reflect sequelae of prior pancreatitis. The adrenal glands are unremarkable in appearance.  A small 5 mm hypodensity in the interpole region of the left kidney likely reflects a small cyst.  Nonspecific perinephric stranding and trace fluid are noted bilaterally.  This is more prominent at the left kidney.  The kidneys are otherwise unremarkable in appearance.  There is no evidence of hydronephrosis.  No renal or ureteral stones are seen.  The small bowel is unremarkable in appearance.  The stomach is filled with contrast and is within normal limits.  No acute vascular abnormalities are seen.  Scattered calcification is noted along the abdominal aorta and its branches, particularly prominent about the origin of the left renal artery.  The appendix is filled with air and is unremarkable in appearance, without evidence for appendicitis.  Mild soft tissue inflammation about the hepatic flexure of the colon reflects the adjacent right upper quadrant process.  Minimal diverticulosis is noted along the distal descending colon, and diverticulosis is seen along the proximal sigmoid colon, without evidence of diverticulitis.  There is apparent left-sided nonspecific wall thickening at the rectum; this is thought to reflect relative decompression, without a definite mass.  The bladder is mildly distended and grossly unremarkable in appearance.  Minimal calcification is noted within the prostate; the prostate remains normal in size.  Trace free fluid within the pelvis likely reflects the inflammatory process at the right upper quadrant.  No inguinal lymphadenopathy is seen.  No acute osseous abnormalities are identified.  IMPRESSION:  1.  Acute cholecystitis noted, with diffuse soft tissue inflammation about the mildly distended gallbladder, and trace associated  pericholecystic fluid.  Trace fluid tracks about the liver.  Soft tissue inflammation involves the proximal duodenum and pancreatic head, though the inflammatory process appears centered at the gallbladder. 2.  Tiny calcifications at the body of the pancreas could reflect sequelae of remote pancreatitis; pancreas otherwise unremarkable in appearance. 3.  Nonspecific bilateral perinephric stranding and trace fluid, more prominent at the left kidney; small left renal cyst noted. 4.  Scattered calcification along the abdominal aorta and its branches, preclude prominent about the origin of the left kidney. 5.  Minimal diverticulosis along the distal descending colon, and diverticulosis along the proximal sigmoid colon, without evidence of diverticulitis. 6.  Trace free fluid within the pelvis, likely reflecting the inflammatory process at the right upper quadrant. 7.  Bibasilar atelectasis noted. 8.  Mild cardiomegaly.  Original Report Authenticated By: Tonia Ghent, M.D.    EKG: Orders placed in visit on 01/28/11  . EKG 12-LEAD     Hospital Course: Patient was admitted to [facility] and taken to the OR and underwent the above state procedure without complications.  Patient tolerated the procedure well and was later transferred to the recovery room and then to the orthopaedic floor for postoperative care.  They were given PO and IV analgesics for pain control following their surgery.  They were given 24 hours of postoperative antibiotics and started on DVT prophylaxis.   PT and OT were ordered for total joint protocol.  Discharge planning consulted to help with postop disposition  and equipment needs.  Patient had a rough night on the evening of surgery and started to get up with therapy on day one.  PCA was discontinued and they were weaned over to PO meds.  Hemovac drain was pulled without difficulty.  Continued to progress with therapy into day two but still with pain.  Dressing was changed on day  two and the incision was healing well with no obvious signs of infection.  By day three, the patient had progressed with therapy and meeting goals. He was having back pain but did have a chronic history of back issues.  Incision was healing well.  Patient was seen in rounds and was ready to go home.  Discharge Medications: Prior to Admission medications   Medication Sig Start Date End Date Taking? Authorizing Provider  atorvastatin (LIPITOR) 40 MG tablet Take 40 mg by mouth at bedtime.     Historical Provider, MD  cetirizine (ZYRTEC) 10 MG tablet Take 10 mg by mouth daily.     Historical Provider, MD  HYDROmorphone (DILAUDID) 2 MG tablet Take 1-2 tablets (2-4 mg total) by mouth every 4 (four) hours as needed. 03/07/11 03/17/11  Gus Rankin Aluisio  lisinopril-hydrochlorothiazide (PRINZIDE,ZESTORETIC) 10-12.5 MG per tablet Take 1 tablet by mouth every morning.     Historical Provider, MD  methocarbamol (ROBAXIN) 500 MG tablet Take 1 tablet (500 mg total) by mouth every 6 (six) hours as needed. 03/07/11 03/17/11  Gus Rankin Aluisio  Methylcellulose, Laxative, (CITRUCEL) 500 MG TABS Take 1 tablet by mouth daily.      Historical Provider, MD  morphine (MSIR) 15 MG tablet Take 15 mg by mouth every 6 (six) hours as needed. Pain     Historical Provider, MD  omeprazole (PRILOSEC) 20 MG capsule Take 20 mg by mouth daily.     Historical Provider, MD  polyethylene glycol (MIRALAX) packet Take 17 g by mouth daily as needed. constipation    Historical Provider, MD  rivaroxaban (XARELTO) 10 MG TABS tablet Take 1 tablet (10 mg total) by mouth daily. 03/07/11   Gus Rankin Aluisio  senna-docusate (SENNA PLUS) 8.6-50 MG per tablet Take 1 tablet by mouth 2 (two) times daily as needed. Constipation.  Pt takes one every day but sometimes takes another dose.      Historical Provider, MD    Diet: heart healthy  Activity:WBAT   Follow-up:in 2 weeks  Disposition: home  Discharged Condition: stable   Discharge Orders     Future Orders Please Complete By Expires   Diet - low sodium heart healthy      Call MD / Call 911      Comments:   If you experience chest pain or shortness of breath, CALL 911 and be transported to the hospital emergency room.  If you develope a fever above 101 F, pus (white drainage) or increased drainage or redness at the wound, or calf pain, call your surgeon's office.   Constipation Prevention      Comments:   Drink plenty of fluids.  Prune juice may be helpful.  You may use a stool softener, such as Colace (over the counter) 100 mg twice a day.  Use MiraLax (over the counter) for constipation as needed.   Increase activity slowly as tolerated      Weight Bearing as taught in Physical Therapy      Comments:   Use a walker or crutches as instructed.   Driving restrictions      Comments:   No driving  for 2 weeks   TED hose      Comments:   Use stockings (TED hose) for 2 weeks on right leg(s).  You may remove them at night for sleeping.   Change dressing      Comments:   change the dressing daily with sterile 4 x 4 inch gauze dressing and apply TED hose.     Discharge Medication List as of 03/08/2011 11:55 AM    START taking these medications   Details  HYDROmorphone (DILAUDID) 2 MG tablet Take 1-2 tablets (2-4 mg total) by mouth every 4 (four) hours as needed., Starting 03/07/2011, Until Mon 03/17/11, Print    methocarbamol (ROBAXIN) 500 MG tablet Take 1 tablet (500 mg total) by mouth every 6 (six) hours as needed., Starting 03/07/2011, Until Mon 03/17/11, Print    rivaroxaban (XARELTO) 10 MG TABS tablet Take 1 tablet (10 mg total) by mouth daily., Starting 03/07/2011, Until Discontinued, Print      CONTINUE these medications which have NOT CHANGED   Details  atorvastatin (LIPITOR) 40 MG tablet Take 40 mg by mouth at bedtime. , Until Discontinued, Historical Med    cetirizine (ZYRTEC) 10 MG tablet Take 10 mg by mouth daily. , Until Discontinued, Historical Med      lisinopril-hydrochlorothiazide (PRINZIDE,ZESTORETIC) 10-12.5 MG per tablet Take 1 tablet by mouth every morning. , Until Discontinued, Historical Med    morphine (MSIR) 15 MG tablet Take 15 mg by mouth every 6 (six) hours as needed. Pain , Until Discontinued, Historical Med    omeprazole (PRILOSEC) 20 MG capsule Take 20 mg by mouth daily. , Until Discontinued, Historical Med    polyethylene glycol (MIRALAX) packet Take 17 g by mouth daily as needed. constipation, Until Discontinued, Historical Med    senna-docusate (SENNA PLUS) 8.6-50 MG per tablet Take 1 tablet by mouth 2 (two) times daily as needed. Constipation.  Pt takes one every day but sometimes takes another dose.  , Until Discontinued, Historical Med    Methylcellulose, Laxative, (CITRUCEL) 500 MG TABS Take 1 tablet by mouth daily. , Until Discontinued, Historical Med      STOP taking these medications     aspirin 81 MG tablet      cholecalciferol (VITAMIN D) 1000 UNITS tablet      Omega-3 Fatty Acids (FISH OIL) 1000 MG CAPS        Follow-up Information    Follow up with ALUISIO,FRANK V. Make an appointment in 2 weeks.   Contact information:   [facility] [address] [place] [address] [phone]       Follow up with [facility]. (Home Physical Therapy)    Contact information:   [phone]         Signed: Patrica Duel 03/13/2011, 9:23 PM

## 2011-03-13 NOTE — Progress Notes (Signed)
Subjective: Mr. Steven [name] states that he does not feel well today. He wants the surgery over and done with. He did not eat any breakfast. His wife would also like the orthopedic doctor to come by while he is here in the hospital.  He recently had knee surgery.   Physical Exam: Blood pressure 108/76, pulse 114, temperature 98.1 F (36.7 C), temperature source Oral, resp. rate 18, height 5\' 10"  (1.778 m), weight 128.595 kg (283 lb 8 oz), SpO2 93.00%. He is in pain. He is not toxic or septic. Heart sounds are present and in sinus rhythm. There is no gallop rhythm. There are no murmurs. Lung fields are clear. Abdomen is soft but tender in the right upper quadrant. He is alert and orientated without any focal neurological signs.    Investigations:     Basic Metabolic Panel:  Basename 03/12/11 0314  NA 122*  K 4.2  CL 88*  CO2 26  GLUCOSE 164*  BUN 18  CREATININE 0.75  CALCIUM 8.8  MG --  PHOS --   Liver Function Tests:  [facility] 03/12/11 0314  AST 20  ALT 20  ALKPHOS 94  BILITOT 0.9  PROT 6.3  ALBUMIN 2.6*     CBC:  Basename 03/12/11 0314  WBC 17.3*  NEUTROABS 15.2*  HGB 10.2*  HCT 29.0*  MCV 90.1  PLT 276    Ct Abdomen Pelvis W Contrast  03/12/2011  *RADIOLOGY REPORT*  Clinical Data: Right lower quadrant abdominal pain, radiating to the back.  Leukocytosis.  CT ABDOMEN AND PELVIS WITH CONTRAST  Technique:  Multidetector CT imaging of the abdomen and pelvis was performed following the standard protocol during bolus administration of intravenous contrast.  Contrast: OMNIPAQUE IOHEXOL 300 MG/ML IV SOLN  Comparison: None.  Findings: Bibasilar atelectasis is noted.  The heart is mildly enlarged.  There is diffuse soft tissue stranding noted about the distended gallbladder, compatible with acute cholecystitis.  Trace associated pericholecystic fluid is noted, with trace fluid tracking about the liver. Soft tissue inflammation extends about the proximal duodenum and  pancreatic head, though the inflammatory process appears centered about the gallbladder.  Minimal soft tissue stranding is noted along Gerota's fascia on the right side, with trace associated fluid.  The liver is otherwise unremarkable in appearance.  There is no evidence for significant intrahepatic biliary duct dilatation.  The spleen is within normal limits.  Two small calcifications at the body of the pancreas could reflect sequelae of prior pancreatitis. The adrenal glands are unremarkable in appearance.  A small 5 mm hypodensity in the interpole region of the left kidney likely reflects a small cyst.  Nonspecific perinephric stranding and trace fluid are noted bilaterally.  This is more prominent at the left kidney.  The kidneys are otherwise unremarkable in appearance.  There is no evidence of hydronephrosis.  No renal or ureteral stones are seen.  The small bowel is unremarkable in appearance.  The stomach is filled with contrast and is within normal limits.  No acute vascular abnormalities are seen.  Scattered calcification is noted along the abdominal aorta and its branches, particularly prominent about the origin of the left renal artery.  The appendix is filled with air and is unremarkable in appearance, without evidence for appendicitis.  Mild soft tissue inflammation about the hepatic flexure of the colon reflects the adjacent right upper quadrant process.  Minimal diverticulosis is noted along the distal descending colon, and diverticulosis is seen along the proximal sigmoid colon, without evidence of diverticulitis.  There is apparent left-sided nonspecific wall thickening at the rectum; this is thought to reflect relative decompression, without a definite mass.  The bladder is mildly distended and grossly unremarkable in appearance.  Minimal calcification is noted within the prostate; the prostate remains normal in size.  Trace free fluid within the pelvis likely reflects the inflammatory process at  the right upper quadrant.  No inguinal lymphadenopathy is seen.  No acute osseous abnormalities are identified.  IMPRESSION:  1.  Acute cholecystitis noted, with diffuse soft tissue inflammation about the mildly distended gallbladder, and trace associated pericholecystic fluid.  Trace fluid tracks about the liver.  Soft tissue inflammation involves the proximal duodenum and pancreatic head, though the inflammatory process appears centered at the gallbladder. 2.  Tiny calcifications at the body of the pancreas could reflect sequelae of remote pancreatitis; pancreas otherwise unremarkable in appearance. 3.  Nonspecific bilateral perinephric stranding and trace fluid, more prominent at the left kidney; small left renal cyst noted. 4.  Scattered calcification along the abdominal aorta and its branches, preclude prominent about the origin of the left kidney. 5.  Minimal diverticulosis along the distal descending colon, and diverticulosis along the proximal sigmoid colon, without evidence of diverticulitis. 6.  Trace free fluid within the pelvis, likely reflecting the inflammatory process at the right upper quadrant. 7.  Bibasilar atelectasis noted. 8.  Mild cardiomegaly.  Original Report Authenticated By: Tonia Ghent, M.D.      Medications: I have reviewed the patient's current medications.  Impression:  1. Acute cholecystitis. 2. Hyponatremia. 3. Hypertension. 4. Status post recent right total knee replacement revision. 5. Coronary artery disease, stable.  6. Leukocytosis     Plan: 1. Continue with intravenous antibiotics patient will not eat until after surgery sees him.  Consulted general surgery Dr. Johna Sheriff.  Will recheck a BMP to see if his hyponatremia has improved with IV fluids. Didcall his orthopedics office to notify him that patient is here in the hospital.  Will continue to monitor.   LOS: 1 day   Earlene Plater MD, Ladell Pier 03/13/2011, 8:59 AM

## 2011-03-13 NOTE — Consult Note (Signed)
Steven [name] May 04, 1940  [id number].   Requesting MD: Dr. Earlene Plater Chief Complaint/Reason for Consult: acute cholecystitis HPI: This is a 70 yo WM who developed RUQ abdominal pain on Tuesday.  He did not have any significant nausea or vomiting, just severe pain. He just underwent total knee replacement last Wednesday. He has had pretty significant constipation since then. He thought he was enough to call 911 secondary to the pain however his wife took him to [facility] Very had a workup which revealed via a CT scan that he had acute cholecystitis. He is noted to have a white blood cell count of 17,300. All of his LFTs are otherwise normal. He was also found to have a sodium of 122 which is only increased to 123 today. The patient requested a transfer to [facility] for surgical intervention. Upon arrival we have been asked to evaluate the patient for cholecystectomy.  Review of systems: Please see history of present illness. Otherwise all other systems have been reviewed and are essentially negative. The patient does admit to having coronary artery disease with a blockage of one of the minor arteries and 90%. He does have one stent per his wife, but other stones were not placed as it was known that he would have to have another knee replacement. He does admit to chronic constipation secondary to chronic narcotic use.  Family History  Problem Relation Age of Onset  . Heart attack Father     mi I N HIS 40'S BUT LIVED INTO HIS 90'S WITH COPD  . Other Mother 34    died multiple med problems  . Heart attack Sister 20    Past Medical History  Diagnosis Date  . Obesity   . Dyslipidemia   . Coronary artery disease 02-27-11    Dr. Marliss Coots follows-has some blocked coronary arteries ,not suitable for stent  placement  . Hypertension   . Shingles outbreak 02-27-11    2 weeks ago , was tx.-only residual is tenderness of right scalp-no open areas  . GERD (gastroesophageal  reflux disease) 02-27-11    Acid reflux  . Hemorrhoids 02-27-11    not bothersome at this time  . Constipation - functional 02-27-11    due to pain meds  . Hearing loss 02-27-11    Bilateral hearing aids-due to exposure to loud machinery  . Urinary incontinence 02-27-11    not an everday occurrence-no special measures  . Osteoarthritis 02-27-11    Ostearthritis-knees, shoulders.Back causes chronic pain-radiates down right leg  . Chronic pain     Past Surgical History  Procedure Date  . Cardiac catheterization 2011    some blockages, no stents planned due to Joint surgery planned  . Replacement total knee 02-27-11    right- 2007  . Total knee revision 03/05/2011    Procedure: TOTAL KNEE REVISION;  Surgeon: Gus Rankin Aluisio;  Location: [facility]S;  Service: Orthopedics;  Laterality: Right;  . Injection knee 03/05/2011    Procedure: KNEE INJECTION;  Surgeon: Loanne Drilling;  Location: [facility];  Service: Orthopedics;  Laterality: Left;  80 mg depomedrol    Social History:  reports that he has quit smoking. He does not have any smokeless tobacco history on file. He reports that he does not drink alcohol or use illicit drugs.  Allergies:  Allergies  Allergen Reactions  . Oxycodone-Acetaminophen Rash and Other (See Comments)    Felt like he was hot    Medications Prior to Admission  Medication Dose  Route Frequency Provider Last Rate Last Dose  . 0.9 %  sodium chloride infusion   Intravenous Continuous Nicholes Stairs, MD 125 mL/hr at 03/12/11 0334    . acetaminophen (TYLENOL) tablet 650 mg  650 mg Oral Q6H PRN Gokul Krishnan       Or  . acetaminophen (TYLENOL) suppository 650 mg  650 mg Rectal Q6H PRN Osvaldo Shipper      . albuterol (PROVENTIL) (5 MG/ML) 0.5% nebulizer solution 2.5 mg  2.5 mg Nebulization Q2H PRN Osvaldo Shipper      . Ampicillin-Sulbactam (UNASYN) 3 g in sodium chloride 0.9 % 100 mL IVPB  3 g Intravenous Once Nicholes Stairs, MD   3 g at 03/12/11 0549  .  chlorhexidine (HIBICLENS) 4 % liquid 4 application  60 mL Topical Once Homero Fellers V Aluisio      . chlorhexidine (HIBICLENS) 4 % liquid 4 application  60 mL Topical Once Homero Fellers V Aluisio      . HYDROmorphone (DILAUDID) injection 1 mg  1 mg Intravenous Once Nicholes Stairs, MD   1 mg at 03/12/11 0334  . HYDROmorphone (DILAUDID) injection 1 mg  1 mg Intravenous Once Nicholes Stairs, MD   1 mg at 03/12/11 0531  . HYDROmorphone (DILAUDID) injection 1 mg  1 mg Intravenous Q3H PRN Gokul Krishnan   1 mg at 03/13/11 1147  . iohexol (OMNIPAQUE) 300 MG/ML injection 120 mL  120 mL Intravenous Once PRN Medication Radiologist   120 mL at 03/12/11 0458  . ondansetron (ZOFRAN) injection 4 mg  4 mg Intravenous Once Nicholes Stairs, MD   4 mg at 03/12/11 0334  . ondansetron (ZOFRAN) tablet 4 mg  4 mg Oral Q6H PRN Gokul Krishnan       Or  . ondansetron (ZOFRAN) injection 4 mg  4 mg Intravenous Q6H PRN Gokul Krishnan      . piperacillin-tazobactam (ZOSYN) IVPB 3.375 g  3.375 g Intravenous Q8H Scott A Hall, PHARMD   3.375 g at 03/13/11 1610  . promethazine (PHENERGAN) tablet 12.5 mg  12.5 mg Oral Q6H PRN Gokul Krishnan       Or  . promethazine (PHENERGAN) injection 12.5 mg  12.5 mg Intravenous Q6H PRN Gokul Krishnan       Medications Prior to Admission  Medication Sig Dispense Refill  . atorvastatin (LIPITOR) 40 MG tablet Take 40 mg by mouth at bedtime.       . cetirizine (ZYRTEC) 10 MG tablet Take 10 mg by mouth daily.       Marland Kitchen HYDROmorphone (DILAUDID) 2 MG tablet Take 1-2 tablets (2-4 mg total) by mouth every 4 (four) hours as needed.  80 tablet  0  . lisinopril-hydrochlorothiazide (PRINZIDE,ZESTORETIC) 10-12.5 MG per tablet Take 1 tablet by mouth every morning.       . methocarbamol (ROBAXIN) 500 MG tablet Take 1 tablet (500 mg total) by mouth every 6 (six) hours as needed.  60 tablet  1  . morphine (MSIR) 15 MG tablet Take 15 mg by mouth every 6 (six) hours as needed. Pain       . omeprazole (PRILOSEC)  20 MG capsule Take 20 mg by mouth daily.       . polyethylene glycol (MIRALAX) packet Take 17 g by mouth daily as needed. constipation      . rivaroxaban (XARELTO) 10 MG TABS tablet Take 1 tablet (10 mg total) by mouth daily.  17 tablet  0  . senna-docusate (SENNA PLUS) 8.6-50 MG per tablet  Take 1 tablet by mouth 2 (two) times daily as needed. Constipation.  Pt takes one every day but sometimes takes another dose.          Blood pressure 108/76, pulse 114, temperature 98.1 F (36.7 C), temperature source Oral, resp. rate 18, height 5\' 10"  (1.778 m), weight 283 lb 8 oz (128.595 kg), SpO2 93.00%. Clinical exam: Gen.: This is a pleasant 70 year old white male who is currently lying in bed in mild distress secondary to pain. HEENT: Head is normocephalic, atraumatic. Sclera not injected. Pupils are equal, round, reactive to light. Ears and nose without any obvious masses or lesions. Mouth is pink. Heart: Regular rhythm however he is tachycardic. Normal S1-S2. No obvious murmurs, gallops, rubs are noted. He does have palpable radial and pedal pulses bilaterally. Lungs: Clear Hospital patient bilaterally. No wheezes, rhonchi, rails. Respiratory effort is nonlabored. Abdomen: Soft and tender in the right upper quadrant with a positive Murphy sign. He does have some active bowel sounds. He's nondistended. No masses, hernias, organomegaly are noted. Musculoskeletal: All 4 extremities are symmetrical with no cyanosis, clubbing, edema Skin: Warm and dry. No masses, lesions, or rashes. Psych: The patient is alert and oriented x3 with an appropriate affect.   Results for orders placed during the hospital encounter of 03/12/11 (from the past 48 hour(s))  CBC     Status: Abnormal   Collection Time   03/12/11  3:14 AM      Component Value Range Comment   WBC 17.3 (*) 4.0 - 10.5 (K/uL)    RBC 3.22 (*) 4.22 - 5.81 (MIL/uL)    Hemoglobin 10.2 (*) 13.0 - 17.0 (g/dL)    HCT 32.4 (*) 40.1 - 52.0 (%)    MCV  90.1  78.0 - 100.0 (fL)    MCH 31.7  26.0 - 34.0 (pg)    MCHC 35.2  30.0 - 36.0 (g/dL)    RDW 02.7  25.3 - 66.4 (%)    Platelets 276  150 - 400 (K/uL)   DIFFERENTIAL     Status: Abnormal   Collection Time   03/12/11  3:14 AM      Component Value Range Comment   Neutrophils Relative 88 (*) 43 - 77 (%)    Neutro Abs 15.2 (*) 1.7 - 7.7 (K/uL)    Lymphocytes Relative 6 (*) 12 - 46 (%)    Lymphs Abs 1.0  0.7 - 4.0 (K/uL)    Monocytes Relative 6  3 - 12 (%)    Monocytes Absolute 1.1 (*) 0.1 - 1.0 (K/uL)    Eosinophils Relative 0  0 - 5 (%)    Eosinophils Absolute 0.0  0.0 - 0.7 (K/uL)    Basophils Relative 0  0 - 1 (%)    Basophils Absolute 0.0  0.0 - 0.1 (K/uL)   COMPREHENSIVE METABOLIC PANEL     Status: Abnormal   Collection Time   03/12/11  3:14 AM      Component Value Range Comment   Sodium 122 (*) 135 - 145 (mEq/L)    Potassium 4.2  3.5 - 5.1 (mEq/L)    Chloride 88 (*) 96 - 112 (mEq/L)    CO2 26  19 - 32 (mEq/L)    Glucose, Bld 164 (*) 70 - 99 (mg/dL)    BUN 18  6 - 23 (mg/dL)    Creatinine, Ser 4.03  0.50 - 1.35 (mg/dL)    Calcium 8.8  8.4 - 10.5 (mg/dL)    Total Protein 6.3  6.0 -  8.3 (g/dL)    Albumin 2.6 (*) 3.5 - 5.2 (g/dL)    AST 20  0 - 37 (U/L)    ALT 20  0 - 53 (U/L)    Alkaline Phosphatase 94  39 - 117 (U/L)    Total Bilirubin 0.9  0.3 - 1.2 (mg/dL)    GFR calc non Af Amer >90  >90 (mL/min)    GFR calc Af Amer >90  >90 (mL/min)   LIPASE, BLOOD     Status: Normal   Collection Time   03/12/11  3:14 AM      Component Value Range Comment   Lipase 11  11 - 59 (U/L)   PROTIME-INR     Status: Abnormal   Collection Time   03/12/11  3:30 AM      Component Value Range Comment   Prothrombin Time 19.1 (*) 11.6 - 15.2 (seconds)    INR 1.57 (*) 0.00 - 1.49    APTT     Status: Abnormal   Collection Time   03/12/11  3:30 AM      Component Value Range Comment   aPTT 42 (*) 24 - 37 (seconds)   OSMOLALITY, URINE     Status: Normal   Collection Time   03/13/11  1:56 AM        Component Value Range Comment   Osmolality, Ur 787  390 - 1090 (mOsm/kg)   SODIUM, URINE, RANDOM     Status: Normal   Collection Time   03/13/11  1:56 AM      Component Value Range Comment   Sodium, Ur 19     BASIC METABOLIC PANEL     Status: Abnormal   Collection Time   03/13/11 12:50 PM      Component Value Range Comment   Sodium 123 (*) 135 - 145 (mEq/L)    Potassium 4.1  3.5 - 5.1 (mEq/L)    Chloride 87 (*) 96 - 112 (mEq/L)    CO2 28  19 - 32 (mEq/L)    Glucose, Bld 131 (*) 70 - 99 (mg/dL)    BUN 22  6 - 23 (mg/dL)    Creatinine, Ser 0.45  0.50 - 1.35 (mg/dL)    Calcium 8.7  8.4 - 10.5 (mg/dL)    GFR calc non Af Amer 82 (*) >90 (mL/min)    GFR calc Af Amer >90  >90 (mL/min)    Ct Abdomen Pelvis W Contrast  03/12/2011  *RADIOLOGY REPORT*  Clinical Data: Right lower quadrant abdominal pain, radiating to the back.  Leukocytosis.  CT ABDOMEN AND PELVIS WITH CONTRAST  Technique:  Multidetector CT imaging of the abdomen and pelvis was performed following the standard protocol during bolus administration of intravenous contrast.  Contrast: OMNIPAQUE IOHEXOL 300 MG/ML IV SOLN  Comparison: None.  Findings: Bibasilar atelectasis is noted.  The heart is mildly enlarged.  There is diffuse soft tissue stranding noted about the distended gallbladder, compatible with acute cholecystitis.  Trace associated pericholecystic fluid is noted, with trace fluid tracking about the liver. Soft tissue inflammation extends about the proximal duodenum and pancreatic head, though the inflammatory process appears centered about the gallbladder.  Minimal soft tissue stranding is noted along Gerota's fascia on the right side, with trace associated fluid.  The liver is otherwise unremarkable in appearance.  There is no evidence for significant intrahepatic biliary duct dilatation.  The spleen is within normal limits.  Two small calcifications at the body of the pancreas could reflect sequelae of  prior  pancreatitis. The adrenal glands are unremarkable in appearance.  A small 5 mm hypodensity in the interpole region of the left kidney likely reflects a small cyst.  Nonspecific perinephric stranding and trace fluid are noted bilaterally.  This is more prominent at the left kidney.  The kidneys are otherwise unremarkable in appearance.  There is no evidence of hydronephrosis.  No renal or ureteral stones are seen.  The small bowel is unremarkable in appearance.  The stomach is filled with contrast and is within normal limits.  No acute vascular abnormalities are seen.  Scattered calcification is noted along the abdominal aorta and its branches, particularly prominent about the origin of the left renal artery.  The appendix is filled with air and is unremarkable in appearance, without evidence for appendicitis.  Mild soft tissue inflammation about the hepatic flexure of the colon reflects the adjacent right upper quadrant process.  Minimal diverticulosis is noted along the distal descending colon, and diverticulosis is seen along the proximal sigmoid colon, without evidence of diverticulitis.  There is apparent left-sided nonspecific wall thickening at the rectum; this is thought to reflect relative decompression, without a definite mass.  The bladder is mildly distended and grossly unremarkable in appearance.  Minimal calcification is noted within the prostate; the prostate remains normal in size.  Trace free fluid within the pelvis likely reflects the inflammatory process at the right upper quadrant.  No inguinal lymphadenopathy is seen.  No acute osseous abnormalities are identified.  IMPRESSION:  1.  Acute cholecystitis noted, with diffuse soft tissue inflammation about the mildly distended gallbladder, and trace associated pericholecystic fluid.  Trace fluid tracks about the liver.  Soft tissue inflammation involves the proximal duodenum and pancreatic head, though the inflammatory process appears centered at the  gallbladder. 2.  Tiny calcifications at the body of the pancreas could reflect sequelae of remote pancreatitis; pancreas otherwise unremarkable in appearance. 3.  Nonspecific bilateral perinephric stranding and trace fluid, more prominent at the left kidney; small left renal cyst noted. 4.  Scattered calcification along the abdominal aorta and its branches, preclude prominent about the origin of the left kidney. 5.  Minimal diverticulosis along the distal descending colon, and diverticulosis along the proximal sigmoid colon, without evidence of diverticulitis. 6.  Trace free fluid within the pelvis, likely reflecting the inflammatory process at the right upper quadrant. 7.  Bibasilar atelectasis noted. 8.  Mild cardiomegaly.  Original Report Authenticated By: Tonia Ghent, M.D.       Assessment/Plan 1. Acute cholecystitis 2. Hyponatremia 3. coronary artery disease 4. hypertension 5. Recent total right knee arthroplasty on Xarelto, but stopped on Tuesday  Plan: I have discussed the Xarelto with the pharmacist who says its half-life is 9 hours and as the patient has stopped this for the last 48 hours we should be safe to proceed with an operation. Patient's sodium level is still 123 today. We will need to discuss this with the anesthesiologist to confirm whether they are comfortable putting him to sleep with a sodium level the flow. I will also likely need to talk to the hospitalist to see if there is any way that we can increase the amount of sodium he is getting. He is currently getting a normal saline IV solution which is the highest amount of sodium available. The wife to just informing the patient underwent knee replacement last week, but under spinal anesthesia instead of under general anesthesia. We will need to figure out if this was selected for cardiac  purposes or if this was the preference of the orthopedic surgeon. If this was selected because of cardiac concern we will likely need to have  Dr. Excell Seltzer or one of his associates evaluate the patient today prior to surgical intervention to make sure that his heart is strong enough for surgery. We will recheck his labs in the morning and hopefully plan for surgical intervention tomorrow. Adalyne Lovick E 03/13/2011, 1:57 PM

## 2011-03-13 NOTE — Progress Notes (Signed)
UR CHART REVIEWED 

## 2011-03-13 NOTE — Consults (Signed)
HPI: 70 year old male for preoperative evaluation prior to cholecystectomy. Patient is followed by Dr. Excell Seltzer. Patient's last cardiac catheterization was performed in Dec 2011 because of inferolateral ischemia noted on a Myoview. This revealed  normal LV function. There was a 20% left main. There is a 40-50% proximal LAD. There is some collaterals to the left circumflex which was small. There was a 30% RCA lesion. There is a 50% ostial PDA. There was an 85-90% posterior lateral which is small. The patient has been treated medically. Patient was last seen by Dr. Excell Seltzer in October of 2012. He was cleared for knee surgery and had right knee replacement approximately 2 weeks ago. He has been admitted with abdominal pain and diagnosed with cholecystitis. He will most likely require cholecystectomy and cardiology was asked to evaluate preoperatively. The patient denies dyspnea at present. He has had no chest pain, palpitations or syncope.  Medications Prior to Admission  Medication Dose Route Frequency Provider Last Rate Last Dose  . 0.9 %  sodium chloride infusion   Intravenous Continuous Nicholes Stairs, MD 125 mL/hr at 03/13/11 1500    . acetaminophen (TYLENOL) tablet 650 mg  650 mg Oral Q6H PRN Gokul Krishnan       Or  . acetaminophen (TYLENOL) suppository 650 mg  650 mg Rectal Q6H PRN Osvaldo Shipper      . albuterol (PROVENTIL) (5 MG/ML) 0.5% nebulizer solution 2.5 mg  2.5 mg Nebulization Q2H PRN Osvaldo Shipper      . Ampicillin-Sulbactam (UNASYN) 3 g in sodium chloride 0.9 % 100 mL IVPB  3 g Intravenous Once Nicholes Stairs, MD   3 g at 03/12/11 0549  . chlorhexidine (HIBICLENS) 4 % liquid 4 application  60 mL Topical Once Homero Fellers V Aluisio      . chlorhexidine (HIBICLENS) 4 % liquid 4 application  60 mL Topical Once Homero Fellers V Aluisio      . HYDROmorphone (DILAUDID) injection 0.5-2 mg  0.5-2 mg Intravenous Q2H PRN Letha Cape, PA   2 mg at 03/13/11 1500  . HYDROmorphone (DILAUDID) injection 1 mg   1 mg Intravenous Once Nicholes Stairs, MD   1 mg at 03/12/11 0334  . HYDROmorphone (DILAUDID) injection 1 mg  1 mg Intravenous Once Nicholes Stairs, MD   1 mg at 03/12/11 0531  . iohexol (OMNIPAQUE) 300 MG/ML injection 120 mL  120 mL Intravenous Once PRN Medication Radiologist   120 mL at 03/12/11 0458  . ondansetron (ZOFRAN) injection 4 mg  4 mg Intravenous Once Nicholes Stairs, MD   4 mg at 03/12/11 0334  . ondansetron (ZOFRAN) tablet 4 mg  4 mg Oral Q6H PRN Gokul Krishnan       Or  . ondansetron (ZOFRAN) injection 4 mg  4 mg Intravenous Q6H PRN Gokul Krishnan      . piperacillin-tazobactam (ZOSYN) IVPB 3.375 g  3.375 g Intravenous Q8H Scott A Hall, PHARMD   3.375 g at 03/13/11 1610  . promethazine (PHENERGAN) tablet 12.5 mg  12.5 mg Oral Q6H PRN Gokul Krishnan       Or  . promethazine (PHENERGAN) injection 12.5 mg  12.5 mg Intravenous Q6H PRN Gokul Krishnan      . DISCONTD: HYDROmorphone (DILAUDID) injection 1 mg  1 mg Intravenous Q3H PRN Gokul Krishnan   1 mg at 03/13/11 1147   Medications Prior to Admission  Medication Sig Dispense Refill  . atorvastatin (LIPITOR) 40 MG tablet Take 40 mg by mouth at bedtime.       Marland Kitchen  cetirizine (ZYRTEC) 10 MG tablet Take 10 mg by mouth daily.       Marland Kitchen HYDROmorphone (DILAUDID) 2 MG tablet Take 1-2 tablets (2-4 mg total) by mouth every 4 (four) hours as needed.  80 tablet  0  . lisinopril-hydrochlorothiazide (PRINZIDE,ZESTORETIC) 10-12.5 MG per tablet Take 1 tablet by mouth every morning.       . methocarbamol (ROBAXIN) 500 MG tablet Take 1 tablet (500 mg total) by mouth every 6 (six) hours as needed.  60 tablet  1  . morphine (MSIR) 15 MG tablet Take 15 mg by mouth every 6 (six) hours as needed. Pain       . omeprazole (PRILOSEC) 20 MG capsule Take 20 mg by mouth daily.       . polyethylene glycol (MIRALAX) packet Take 17 g by mouth daily as needed. constipation      . rivaroxaban (XARELTO) 10 MG TABS tablet Take 1 tablet (10 mg total) by mouth  daily.  17 tablet  0  . senna-docusate (SENNA PLUS) 8.6-50 MG per tablet Take 1 tablet by mouth 2 (two) times daily as needed. Constipation.  Pt takes one every day but sometimes takes another dose.          Allergies  Allergen Reactions  . Oxycodone-Acetaminophen Rash and Other (See Comments)    Felt like he was hot    Past Medical History  Diagnosis Date  . Obesity   . Dyslipidemia   . Coronary artery disease 02-27-11    Dr. Marliss Coots follows-has some blocked coronary arteries ,not suitable for stent  placement  . Hypertension   . Shingles outbreak 02-27-11    2 weeks ago , was tx.-only residual is tenderness of right scalp-no open areas  . GERD (gastroesophageal reflux disease) 02-27-11    Acid reflux  . Hemorrhoids 02-27-11    not bothersome at this time  . Constipation - functional 02-27-11    due to pain meds  . Hearing loss 02-27-11    Bilateral hearing aids-due to exposure to loud machinery  . Urinary incontinence 02-27-11    not an everday occurrence-no special measures  . Osteoarthritis 02-27-11    Ostearthritis-knees, shoulders.Back causes chronic pain-radiates down right leg  . Chronic pain     Past Surgical History  Procedure Date  . Cardiac catheterization 2011    some blockages, no stents planned due to Joint surgery planned  . Replacement total knee 02-27-11    right- 2007  . Total knee revision 03/05/2011    Procedure: TOTAL KNEE REVISION;  Surgeon: Gus Rankin Aluisio;  Location: [facility];  Service: Orthopedics;  Laterality: Right;  . Injection knee 03/05/2011    Procedure: KNEE INJECTION;  Surgeon: Loanne Drilling;  Location: [facility];  Service: Orthopedics;  Laterality: Left;  80 mg depomedrol    History   Social History  . Marital Status: Married    Spouse Name: N/A    Number of Children: N/A  . Years of Education: N/A   Occupational History  . retired    Social History Main Topics  . Smoking status: Former Games developer  . Smokeless  tobacco: Not on file  . Alcohol Use: No  . Drug Use: No  . Sexually Active: Yes    Birth Control/ Protection: None   Other Topics Concern  . Not on file   Social History Narrative  . No narrative on file    Family History  Problem Relation Age of Onset  . Heart attack Father  mi I N HIS 40'S BUT LIVED INTO HIS 90'S WITH COPD  . Other Mother 65    died multiple med problems  . Heart attack Sister 64    ROS: Abdominal pain and pain from recent knee replacement but no fevers or chills, productive cough, hemoptysis, dysphasia, odynophagia, melena, hematochezia, dysuria, hematuria, rash, seizure activity, orthopnea, PND, pedal edema, claudication. Remaining systems are negative.  Physical Exam:   Blood pressure 106/72, pulse 109, temperature 98.2 F (36.8 C), temperature source Oral, resp. rate 19, height 5\' 10"  (1.778 m), weight 283 lb 8 oz (128.595 kg), SpO2 92.00%.  General:  Well developed/well nourished in NAD Skin warm/dry Patient not depressed No peripheral clubbing Back-normal HEENT-normal/normal eyelids Neck supple/normal carotid upstroke bilaterally; no bruits; no JVD; no thyromegaly chest - CTA/ normal expansion CV - RRR/normal S1 and S2; no rubs or gallops;  PMI nondisplaced; 1/6 systolic ejection murmur Abdomen -tender to palpation right lower and mid quadrant/ND, no HSM, no mass, + bowel sounds, no bruit 2+ femoral pulses, no bruits Ext-no edema, chords, 2+ DP Neuro-grossly nonfocal  ECG 01-28-11 Sinus with no ST changes  Results for orders placed during the hospital encounter of 03/12/11 (from the past 48 hour(s))  CBC     Status: Abnormal   Collection Time   03/12/11  3:14 AM      Component Value Range Comment   WBC 17.3 (*) 4.0 - 10.5 (K/uL)    RBC 3.22 (*) 4.22 - 5.81 (MIL/uL)    Hemoglobin 10.2 (*) 13.0 - 17.0 (g/dL)    HCT 54.0 (*) 98.1 - 52.0 (%)    MCV 90.1  78.0 - 100.0 (fL)    MCH 31.7  26.0 - 34.0 (pg)    MCHC 35.2  30.0 - 36.0 (g/dL)     RDW 19.1  47.8 - 29.5 (%)    Platelets 276  150 - 400 (K/uL)   DIFFERENTIAL     Status: Abnormal   Collection Time   03/12/11  3:14 AM      Component Value Range Comment   Neutrophils Relative 88 (*) 43 - 77 (%)    Neutro Abs 15.2 (*) 1.7 - 7.7 (K/uL)    Lymphocytes Relative 6 (*) 12 - 46 (%)    Lymphs Abs 1.0  0.7 - 4.0 (K/uL)    Monocytes Relative 6  3 - 12 (%)    Monocytes Absolute 1.1 (*) 0.1 - 1.0 (K/uL)    Eosinophils Relative 0  0 - 5 (%)    Eosinophils Absolute 0.0  0.0 - 0.7 (K/uL)    Basophils Relative 0  0 - 1 (%)    Basophils Absolute 0.0  0.0 - 0.1 (K/uL)   COMPREHENSIVE METABOLIC PANEL     Status: Abnormal   Collection Time   03/12/11  3:14 AM      Component Value Range Comment   Sodium 122 (*) 135 - 145 (mEq/L)    Potassium 4.2  3.5 - 5.1 (mEq/L)    Chloride 88 (*) 96 - 112 (mEq/L)    CO2 26  19 - 32 (mEq/L)    Glucose, Bld 164 (*) 70 - 99 (mg/dL)    BUN 18  6 - 23 (mg/dL)    Creatinine, Ser 6.21  0.50 - 1.35 (mg/dL)    Calcium 8.8  8.4 - 10.5 (mg/dL)    Total Protein 6.3  6.0 - 8.3 (g/dL)    Albumin 2.6 (*) 3.5 - 5.2 (g/dL)    AST 20  0 - 37 (U/L)    ALT 20  0 - 53 (U/L)    Alkaline Phosphatase 94  39 - 117 (U/L)    Total Bilirubin 0.9  0.3 - 1.2 (mg/dL)    GFR calc non Af Amer >90  >90 (mL/min)    GFR calc Af Amer >90  >90 (mL/min)   LIPASE, BLOOD     Status: Normal   Collection Time   03/12/11  3:14 AM      Component Value Range Comment   Lipase 11  11 - 59 (U/L)   PROTIME-INR     Status: Abnormal   Collection Time   03/12/11  3:30 AM      Component Value Range Comment   Prothrombin Time 19.1 (*) 11.6 - 15.2 (seconds)    INR 1.57 (*) 0.00 - 1.49    APTT     Status: Abnormal   Collection Time   03/12/11  3:30 AM      Component Value Range Comment   aPTT 42 (*) 24 - 37 (seconds)   OSMOLALITY, URINE     Status: Normal   Collection Time   03/13/11  1:56 AM      Component Value Range Comment   Osmolality, Ur 787  390 - 1090 (mOsm/kg)   SODIUM,  URINE, RANDOM     Status: Normal   Collection Time   03/13/11  1:56 AM      Component Value Range Comment   Sodium, Ur 19     BASIC METABOLIC PANEL     Status: Abnormal   Collection Time   03/13/11 12:50 PM      Component Value Range Comment   Sodium 123 (*) 135 - 145 (mEq/L)    Potassium 4.1  3.5 - 5.1 (mEq/L)    Chloride 87 (*) 96 - 112 (mEq/L)    CO2 28  19 - 32 (mEq/L)    Glucose, Bld 131 (*) 70 - 99 (mg/dL)    BUN 22  6 - 23 (mg/dL)    Creatinine, Ser 5.36  0.50 - 1.35 (mg/dL)    Calcium 8.7  8.4 - 10.5 (mg/dL)    GFR calc non Af Amer 82 (*) >90 (mL/min)    GFR calc Af Amer >90  >90 (mL/min)     Ct Abdomen Pelvis W Contrast  03/12/2011  *RADIOLOGY REPORT*  Clinical Data: Right lower quadrant abdominal pain, radiating to the back.  Leukocytosis.  CT ABDOMEN AND PELVIS WITH CONTRAST  Technique:  Multidetector CT imaging of the abdomen and pelvis was performed following the standard protocol during bolus administration of intravenous contrast.  Contrast: OMNIPAQUE IOHEXOL 300 MG/ML IV SOLN  Comparison: None.  Findings: Bibasilar atelectasis is noted.  The heart is mildly enlarged.  There is diffuse soft tissue stranding noted about the distended gallbladder, compatible with acute cholecystitis.  Trace associated pericholecystic fluid is noted, with trace fluid tracking about the liver. Soft tissue inflammation extends about the proximal duodenum and pancreatic head, though the inflammatory process appears centered about the gallbladder.  Minimal soft tissue stranding is noted along Gerota's fascia on the right side, with trace associated fluid.  The liver is otherwise unremarkable in appearance.  There is no evidence for significant intrahepatic biliary duct dilatation.  The spleen is within normal limits.  Two small calcifications at the body of the pancreas could reflect sequelae of prior pancreatitis. The adrenal glands are unremarkable in appearance.  A small 5 mm hypodensity in  the  interpole region of the left kidney likely reflects a small cyst.  Nonspecific perinephric stranding and trace fluid are noted bilaterally.  This is more prominent at the left kidney.  The kidneys are otherwise unremarkable in appearance.  There is no evidence of hydronephrosis.  No renal or ureteral stones are seen.  The small bowel is unremarkable in appearance.  The stomach is filled with contrast and is within normal limits.  No acute vascular abnormalities are seen.  Scattered calcification is noted along the abdominal aorta and its branches, particularly prominent about the origin of the left renal artery.  The appendix is filled with air and is unremarkable in appearance, without evidence for appendicitis.  Mild soft tissue inflammation about the hepatic flexure of the colon reflects the adjacent right upper quadrant process.  Minimal diverticulosis is noted along the distal descending colon, and diverticulosis is seen along the proximal sigmoid colon, without evidence of diverticulitis.  There is apparent left-sided nonspecific wall thickening at the rectum; this is thought to reflect relative decompression, without a definite mass.  The bladder is mildly distended and grossly unremarkable in appearance.  Minimal calcification is noted within the prostate; the prostate remains normal in size.  Trace free fluid within the pelvis likely reflects the inflammatory process at the right upper quadrant.  No inguinal lymphadenopathy is seen.  No acute osseous abnormalities are identified.  IMPRESSION:  1.  Acute cholecystitis noted, with diffuse soft tissue inflammation about the mildly distended gallbladder, and trace associated pericholecystic fluid.  Trace fluid tracks about the liver.  Soft tissue inflammation involves the proximal duodenum and pancreatic head, though the inflammatory process appears centered at the gallbladder. 2.  Tiny calcifications at the body of the pancreas could reflect sequelae of  remote pancreatitis; pancreas otherwise unremarkable in appearance. 3.  Nonspecific bilateral perinephric stranding and trace fluid, more prominent at the left kidney; small left renal cyst noted. 4.  Scattered calcification along the abdominal aorta and its branches, preclude prominent about the origin of the left kidney. 5.  Minimal diverticulosis along the distal descending colon, and diverticulosis along the proximal sigmoid colon, without evidence of diverticulitis. 6.  Trace free fluid within the pelvis, likely reflecting the inflammatory process at the right upper quadrant. 7.  Bibasilar atelectasis noted. 8.  Mild cardiomegaly.  Original Report Authenticated By: Tonia Ghent, M.D.    Assessment/Plan #1 preoperative evaluation-the patient is not having cardiac symptoms. Catheterization in Dec 2011 is outlined in medical therapy recommended. Patient did well with recent knee replacement. There are no contraindications to cholecystectomy and he does not require further ischemia evaluation preoperatively. Resume all preadmission medications postoperatively. #2 cholecystitis-Gen. surgery will see and make decisions concerning cholecystectomy. #3 hyponatremia etiology is unclear. Would fluid restrict and avoid diuretics at this point. May need gentle hydration with normal saline. Follow sodium. This is being managed by primary care.  #4 coronary artery disease-resume aspirin and statin postoperatively. #5 hypertension-resume blood pressure medications postoperatively. #6 hyperlipidemia resume statin postoperatively. We will follow from a distance. Olga Millers MD 03/13/2011, 3:47 PM

## 2011-03-14 ENCOUNTER — Inpatient Hospital Stay (HOSPITAL_COMMUNITY): Payer: Medicare Other | Admitting: Anesthesiology

## 2011-03-14 ENCOUNTER — Encounter (HOSPITAL_COMMUNITY)
Admission: EM | Disposition: A | Discharge status: Home / Self Care | Payer: Self-pay | Source: Home / Self Care | Attending: Internal Medicine

## 2011-03-14 ENCOUNTER — Other Ambulatory Visit (INDEPENDENT_AMBULATORY_CARE_PROVIDER_SITE_OTHER): Payer: Self-pay | Admitting: General Surgery

## 2011-03-14 ENCOUNTER — Encounter (HOSPITAL_COMMUNITY): Payer: Self-pay | Admitting: Anesthesiology

## 2011-03-14 ENCOUNTER — Inpatient Hospital Stay (HOSPITAL_COMMUNITY): Payer: Medicare Other

## 2011-03-14 ENCOUNTER — Other Ambulatory Visit: Payer: Self-pay

## 2011-03-14 HISTORY — PX: CHOLECYSTECTOMY: SHX55

## 2011-03-14 LAB — CBC
HCT: 29.1 % — ABNORMAL LOW (ref 39.0–52.0)
Hemoglobin: 10.2 g/dL — ABNORMAL LOW (ref 13.0–17.0)
MCH: 31.4 pg (ref 26.0–34.0)
MCHC: 35.1 g/dL (ref 30.0–36.0)
MCV: 89.5 fL (ref 78.0–100.0)
RBC: 3.25 MIL/uL — ABNORMAL LOW (ref 4.22–5.81)

## 2011-03-14 LAB — BASIC METABOLIC PANEL
BUN: 24 mg/dL — ABNORMAL HIGH (ref 6–23)
CO2: 26 mEq/L (ref 19–32)
Chloride: 90 mEq/L — ABNORMAL LOW (ref 96–112)
Glucose, Bld: 107 mg/dL — ABNORMAL HIGH (ref 70–99)
Potassium: 4 mEq/L (ref 3.5–5.1)
Sodium: 123 mEq/L — ABNORMAL LOW (ref 135–145)

## 2011-03-14 SURGERY — LAPAROSCOPIC CHOLECYSTECTOMY
Anesthesia: General | Site: Abdomen | Wound class: Contaminated

## 2011-03-14 MED ORDER — PROPOFOL 10 MG/ML IV EMUL
INTRAVENOUS | Status: DC | PRN
  Administered 2011-03-14: 180 mg via INTRAVENOUS

## 2011-03-14 MED ORDER — MIDAZOLAM HCL 5 MG/5ML IJ SOLN
INTRAMUSCULAR | Status: DC | PRN
  Administered 2011-03-14 (×2): 1 mg via INTRAVENOUS

## 2011-03-14 MED ORDER — LIDOCAINE HCL (CARDIAC) 20 MG/ML IV SOLN
INTRAVENOUS | Status: DC | PRN
  Administered 2011-03-14: 100 mg via INTRAVENOUS

## 2011-03-14 MED ORDER — LACTATED RINGERS IV SOLN
INTRAVENOUS | Status: DC | PRN
  Administered 2011-03-14: 18:00:00 via INTRAVENOUS

## 2011-03-14 MED ORDER — NEOSTIGMINE METHYLSULFATE 1 MG/ML IJ SOLN
INTRAMUSCULAR | Status: DC | PRN
  Administered 2011-03-14: 4 mg via INTRAVENOUS

## 2011-03-14 MED ORDER — ROCURONIUM BROMIDE 100 MG/10ML IV SOLN
INTRAVENOUS | Status: DC | PRN
  Administered 2011-03-14: 20 mg via INTRAVENOUS
  Administered 2011-03-14: 25 mg via INTRAVENOUS
  Administered 2011-03-14: 10 mg via INTRAVENOUS
  Administered 2011-03-14 (×2): 5 mg via INTRAVENOUS

## 2011-03-14 MED ORDER — SUCCINYLCHOLINE CHLORIDE 20 MG/ML IJ SOLN
INTRAMUSCULAR | Status: DC | PRN
  Administered 2011-03-14: 100 mg via INTRAVENOUS

## 2011-03-14 MED ORDER — SENNOSIDES-DOCUSATE SODIUM 8.6-50 MG PO TABS
1.0000 | ORAL_TABLET | Freq: Two times a day (BID) | ORAL | Status: DC
  Administered 2011-03-14 – 2011-03-16 (×4): 1 via ORAL
  Filled 2011-03-14 (×9): qty 1

## 2011-03-14 MED ORDER — PROMETHAZINE HCL 25 MG/ML IJ SOLN: 6.2500 mg | INTRAMUSCULAR | Status: DC | PRN

## 2011-03-14 MED ORDER — LACTATED RINGERS IR SOLN
Status: DC | PRN
  Administered 2011-03-14: 1000 mL

## 2011-03-14 MED ORDER — HYDROMORPHONE HCL PF 1 MG/ML IJ SOLN
0.2500 mg | INTRAMUSCULAR | Status: DC | PRN
  Administered 2011-03-14: 0.5 mg via INTRAVENOUS
  Administered 2011-03-14: 0.25 mg via INTRAVENOUS
  Administered 2011-03-14: 0.5 mg via INTRAVENOUS
  Administered 2011-03-14 (×3): 0.25 mg via INTRAVENOUS

## 2011-03-14 MED ORDER — ONDANSETRON HCL 4 MG/2ML IJ SOLN
INTRAMUSCULAR | Status: DC | PRN
  Administered 2011-03-14: 4 mg via INTRAVENOUS

## 2011-03-14 MED ORDER — LACTATED RINGERS IV SOLN: INTRAVENOUS | Status: DC

## 2011-03-14 MED ORDER — BUPIVACAINE-EPINEPHRINE 0.25% -1:200000 IJ SOLN
INTRAMUSCULAR | Status: DC | PRN
  Administered 2011-03-14: 10 mL

## 2011-03-14 MED ORDER — POLYETHYLENE GLYCOL 3350 17 G PO PACK
17.0000 g | PACK | Freq: Every day | ORAL | Status: DC
  Administered 2011-03-15 – 2011-03-16 (×2): 17 g via ORAL
  Filled 2011-03-14 (×3): qty 1

## 2011-03-14 MED ORDER — EPHEDRINE SULFATE 50 MG/ML IJ SOLN
INTRAMUSCULAR | Status: DC | PRN
  Administered 2011-03-14: 5 mg via INTRAVENOUS

## 2011-03-14 MED ORDER — LACTATED RINGERS IV SOLN
INTRAVENOUS | Status: DC
  Administered 2011-03-14: 23:00:00 via INTRAVENOUS

## 2011-03-14 MED ORDER — FENTANYL CITRATE 0.05 MG/ML IJ SOLN
INTRAMUSCULAR | Status: DC | PRN
  Administered 2011-03-14: 100 ug via INTRAVENOUS
  Administered 2011-03-14 (×3): 50 ug via INTRAVENOUS

## 2011-03-14 MED ORDER — GLYCOPYRROLATE 0.2 MG/ML IJ SOLN
INTRAMUSCULAR | Status: DC | PRN
  Administered 2011-03-14: .6 mg via INTRAVENOUS

## 2011-03-14 SURGICAL SUPPLY — 44 items
ADH SKN CLS APL DERMABOND .7 (GAUZE/BANDAGES/DRESSINGS) ×2
APL SKNCLS STERI-STRIP NONHPOA (GAUZE/BANDAGES/DRESSINGS)
APPLIER CLIP ROT 10 11.4 M/L (STAPLE) ×3
APR CLP MED LRG 11.4X10 (STAPLE) ×2
BAG SPEC RTRVL LRG 6X4 10 (ENDOMECHANICALS) ×2
BENZOIN TINCTURE PRP APPL 2/3 (GAUZE/BANDAGES/DRESSINGS) IMPLANT
CANISTER SUCTION 2500CC (MISCELLANEOUS) ×3 IMPLANT
CATH REDDICK CHOLANGI 4FR 50CM (CATHETERS) ×2 IMPLANT
CLIP APPLIE ROT 10 11.4 M/L (STAPLE) ×2 IMPLANT
CLOTH BEACON ORANGE TIMEOUT ST (SAFETY) ×3 IMPLANT
COVER MAYO STAND STRL (DRAPES) ×3 IMPLANT
DECANTER SPIKE VIAL GLASS SM (MISCELLANEOUS) ×3 IMPLANT
DERMABOND ADVANCED (GAUZE/BANDAGES/DRESSINGS) ×1
DERMABOND ADVANCED .7 DNX12 (GAUZE/BANDAGES/DRESSINGS) ×1 IMPLANT
DRAPE C-ARM 42X72 X-RAY (DRAPES) ×3 IMPLANT
DRAPE LAPAROSCOPIC ABDOMINAL (DRAPES) ×3 IMPLANT
ELECT REM PT RETURN 9FT ADLT (ELECTROSURGICAL) ×3
ELECTRODE REM PT RTRN 9FT ADLT (ELECTROSURGICAL) ×2 IMPLANT
GLOVE BIOGEL PI IND STRL 7.0 (GLOVE) ×2 IMPLANT
GLOVE BIOGEL PI INDICATOR 7.0 (GLOVE) ×1
GLOVE SS BIOGEL STRL SZ 7.5 (GLOVE) ×2 IMPLANT
GLOVE SUPERSENSE BIOGEL SZ 7.5 (GLOVE) ×1
GOWN STRL NON-REIN LRG LVL3 (GOWN DISPOSABLE) ×3 IMPLANT
GOWN STRL REIN XL XLG (GOWN DISPOSABLE) ×6 IMPLANT
HEMOSTAT SURGICEL 4X8 (HEMOSTASIS) ×2 IMPLANT
IV CATH 14GX2 1/4 (CATHETERS) IMPLANT
IV SET EXT 30 76VOL 4 MALE LL (IV SETS) IMPLANT
KIT BASIN OR (CUSTOM PROCEDURE TRAY) ×3 IMPLANT
NS IRRIG 1000ML POUR BTL (IV SOLUTION) ×2 IMPLANT
POUCH SPECIMEN RETRIEVAL 10MM (ENDOMECHANICALS) ×2 IMPLANT
SET CHOLANGIOGRAPH MIX (MISCELLANEOUS) ×3 IMPLANT
SET IRRIG TUBING LAPAROSCOPIC (IRRIGATION / IRRIGATOR) ×3 IMPLANT
SLEEVE Z-THREAD 5X100MM (TROCAR) ×3 IMPLANT
SOLUTION ANTI FOG 6CC (MISCELLANEOUS) ×3 IMPLANT
STOPCOCK K 69 2C6206 (IV SETS) ×2 IMPLANT
STRIP CLOSURE SKIN 1/2X4 (GAUZE/BANDAGES/DRESSINGS) IMPLANT
SUT ETHILON 2 0 PS N (SUTURE) ×2 IMPLANT
SUT MNCRL AB 4-0 PS2 18 (SUTURE) ×3 IMPLANT
TOWEL OR 17X26 10 PK STRL BLUE (TOWEL DISPOSABLE) ×5 IMPLANT
TRAY LAP CHOLE (CUSTOM PROCEDURE TRAY) ×3 IMPLANT
TROCAR HASSON GELL 12X100 (TROCAR) ×3 IMPLANT
TROCAR Z-THREAD FIOS 11X100 BL (TROCAR) ×3 IMPLANT
TROCAR Z-THREAD FIOS 5X100MM (TROCAR) ×5 IMPLANT
TUBING INSUFFLATION 10FT LAP (TUBING) ×3 IMPLANT

## 2011-03-14 NOTE — Op Note (Signed)
Surgeon: Glenna Fellows T   Assistants: Cyndia Bent M.D.  Anesthesia: General endotracheal anesthesia  Indications: patient is a 70 year old male who 4 days ago developed acute severe epigastric and right upper quadrant abdominal pain. CT scan shows gallstones and evidence of severe acute cholecystitis. Today his white count is up to 29,000 and he has marked epigastric and right upper quadrant tenderness. I recommended proceeding with emergency laparoscopic and possible open cholecystectomy. I discussed the nature of the procedure and its indications as well as risks of anesthetic complications, bleeding, infection, visceral or bile duct injury, and possible need for open procedure with the patient and his family. They understand and desire to proceed.    Procedure Detail: patient is brought to the operating room and placed in the supine position on the operating table. General endotracheal anesthesia is induced. The abdomen was widely sterilely prepped and draped. Timeout procedure was performed and correct procedure verified. Access was obtained with an open Hassan technique with a small midline incision above the umbilicus and pneumoperitoneum established. Under direct vision an 11 mm trocar was placed subxiphoid and 2 5 mm trochars along the right subcostal margin.omentum was bluntly taken down off the gallbladder. There was a severe inflammatory process in the right upper quadrant with bile-stained fluid and the gallbladder was tensely distended and acutely inflamed with patchy gangrenous changes in the wall. The gallbladder was decompressed with an aspiration needle allowing the fundus to be grasped and elevated. Careful blunt dissection was used to strip the omentum down off the severely inflamed gallbladder and the infundibulum was able to be exposed and grasped and retracted laterally. A 30 scope was used for better visualization and using careful blunt dissection I was able to  dissect the infundibulum and distal gallbladder and close triangle. Gallbladder cystic duct junction was identified and the cystic duct and cystic artery identified and the cystic duct gallbladder junction dissected 360. The cystic duct was viable and of reasonably good structure to do to the severe surrounding inflammation, somewhat difficult exposure, and a fairly short cystic duct I elected not to perform cholangiogram and the cystic duct was doubly clipped proximally clipped distally and divided. The cystic artery was similarly clipped and divided. The gallbladder was then dissected away from the liver bed using cautery placed in an Endo Catch bag and removed through the periumbilical incision. The right upper quadrant was thoroughly irrigated. Hemostasis was obtained in the gallbladder bed using cautery. A 19 Blake drain was left in Morison's pouch and brought out through a lateral trocar site. Surgicel was placed in the gallbladder bed. Hemostasis was obtained. Trochars were removed and all CO2 evacuated. The mattress suture secured at the periumbilical incision. Skin incisions were closed with subcuticular Monocryl and Dermabond. Sponge needle counts were correct. The patient was taken to the PACU in stable condition.    Findings: severe acute gangrenous cholecystitis   Estimated Blood Loss:  less than 100 mL         Drains: JACKSON-PRATT (JP)         Total IV Fluids:  Blood Given: none          Specimens: gallbladder and contents        Complications:  * No complications entered in OR log *         Disposition: PACU - hemodynamically stable.         Condition: stable  Mariella Saa MD, FACS  03/14/2011, 6:45 PM

## 2011-03-14 NOTE — Transfer of Care (Signed)
Immediate Anesthesia Transfer of Care Note  Patient: Steven [name]  Procedure(s) Performed:  LAPAROSCOPIC CHOLECYSTECTOMY  Patient Location: PACU  Anesthesia Type: General  Level of Consciousness: awake and alert   Airway & Oxygen Therapy: Patient Spontanous Breathing and Patient connected to face mask oxygen  Post-op Assessment: Report given to PACU RN and Post -op Vital signs reviewed and stable  Post vital signs: Reviewed and stable  Complications: No apparent anesthesia complications

## 2011-03-14 NOTE — Progress Notes (Signed)
Agree, for OR today  Mariella Saa MD, FACS  03/14/2011, 4:40 PM

## 2011-03-14 NOTE — Anesthesia Preprocedure Evaluation (Addendum)
Anesthesia Evaluation  Patient identified by MRN, date of birth, ID band Patient awake    Reviewed: Allergy & Precautions, H&P , NPO status   Airway Mallampati: II TM Distance: >3 FB Neck ROM: full    Dental No notable dental hx. (+)    Pulmonary neg pulmonary ROS,  clear to auscultation  Pulmonary exam normal       Cardiovascular Exercise Tolerance: Good hypertension, On Medications + CAD regular Normal Stable, non-obstructive CAD.  Has been seen by cardiology and  They are OK with him having surgery.   Neuro/Psych Negative Neurological ROS  Negative Psych ROS   GI/Hepatic negative GI ROS, Neg liver ROS, GERD-  Medicated and Controlled,  Endo/Other  Negative Endocrine ROS  Renal/GU negative Renal ROS  Genitourinary negative   Musculoskeletal   Abdominal   Peds  Hematology negative hematology ROS (+)   Anesthesia Other Findings   Reproductive/Obstetrics negative OB ROS                        Anesthesia Physical Anesthesia Plan  ASA: III  Anesthesia Plan: General   Post-op Pain Management:    Induction: Intravenous  Airway Management Planned: Oral ETT  Additional Equipment:   Intra-op Plan:   Post-operative Plan: Extubation in OR  Informed Consent: I have reviewed the patients History and Physical, chart, labs and discussed the procedure including the risks, benefits and alternatives for the proposed anesthesia with the patient or authorized representative who has indicated his/her understanding and acceptance.   Dental Advisory Given  Plan Discussed with: CRNA and Surgeon  Anesthesia Plan Comments:       Anesthesia Quick Evaluation

## 2011-03-14 NOTE — Anesthesia Postprocedure Evaluation (Signed)
  Anesthesia Post-op Note  Patient: Steven [name]  Procedure(s) Performed:  LAPAROSCOPIC CHOLECYSTECTOMY  Patient Location: PACU  Anesthesia Type: General  Level of Consciousness: awake and alert   Airway and Oxygen Therapy: Patient Spontanous Breathing  Post-op Pain: mild  Post-op Assessment: Post-op Vital signs reviewed, Patient's Cardiovascular Status Stable, Respiratory Function Stable, Patent Airway and No signs of Nausea or vomiting  Post-op Vital Signs: stable  Complications: No apparent anesthesia complications

## 2011-03-14 NOTE — Progress Notes (Signed)
Patient ID: Steven [name], male   DOB: [birth year], 70 y.o.   MRN: [id number] Day of Surgery  Subjective: Pt conts to have pain, but feels better with increase in pain meds from yesterday  Objective: Vital signs in last 24 hours: Temp:  [98.2 F (36.8 C)-98.3 F (36.8 C)] 98.2 F (36.8 C) (11/30 0634) Pulse Rate:  [109-113] 113  (11/30 0634) Resp:  [18-19] 18  (11/30 0634) BP: (106-123)/(72-82) 123/82 mmHg (11/30 0634) SpO2:  [92 %-95 %] 94 % (11/30 0634) Last BM Date: 03/11/11  Intake/Output from previous day: 11/29 0701 - 11/30 0700 In: 2220 [P.O.:120; I.V.:2000; IV Piggyback:100] Out: -  Intake/Output this shift:    PE: Abd: soft, still tender, decrease BS, mild obesity  Lab Results:   [facility] 03/14/11 0414 03/12/11 0314  WBC 29.3* 17.3*  HGB 10.2* 10.2*  HCT 29.1* 29.0*  PLT 332 276   BMET  Basename 03/14/11 0414 03/13/11 1835  NA 123* 123*  K 4.0 4.3  CL 90* 89*  CO2 26 28  GLUCOSE 107* 121*  BUN 24* 22  CREATININE 0.80 0.94  CALCIUM 8.5 8.5   PT/INR  Basename 03/12/11 0330  LABPROT 19.1*  INR 1.57*     Studies/Results: No results found.  Anti-infectives: Anti-infectives     Start     Dose/Rate Route Frequency Ordered Stop   03/12/11 0900  piperacillin-tazobactam (ZOSYN) IVPB 3.375 g       3.375 g 12.5 mL/hr over 240 Minutes Intravenous Every 8 hours 03/12/11 0741     03/12/11 0545   Ampicillin-Sulbactam (UNASYN) 3 g in sodium chloride 0.9 % 100 mL IVPB        3 g 100 mL/hr over 60 Minutes Intravenous  Once 03/12/11 0541 03/12/11 0653           Assessment/Plan  1. Acute cholecystitis 2. Hyponatremia  Plan: Plan for OR today.  Would really like NA at least above 125. Will add miralax and senokot for constipation.   LOS: 2 days    Steven [name] 03/14/2011

## 2011-03-14 NOTE — Progress Notes (Signed)
Subjective: Planing of abdominal pain denies any nausea vomiting  Objective: Vital signs in last 24 hours: Filed Vitals:   03/13/11 0516 03/13/11 1400 03/13/11 2044 03/14/11 0634  BP: 108/76 106/72 122/80 123/82  Pulse: 114 109 113 113  Temp: 98.1 F (36.7 C) 98.2 F (36.8 C) 98.3 F (36.8 C) 98.2 F (36.8 C)  TempSrc: Oral Oral Oral Oral  Resp: 18 19 18 18   Height:      Weight:      SpO2: 93% 92% 95% 94%    Intake/Output Summary (Last 24 hours) at 03/14/11 1119 Last data filed at 03/13/11 2300  Gross per 24 hour  Intake   2170 ml  Output      0 ml  Net   2170 ml    Weight change:   General: Alert, awake, oriented x3, in no acute distress. HEENT: No bruits, no goiter. Heart: Regular rate and rhythm, without murmurs, rubs, gallops. Lungs: Clear to auscultation bilaterally. Abdomen: Soft, nontender, nondistended, positive bowel sounds. Extremities: No clubbing cyanosis or edema with positive pedal pulses. Neuro: Grossly intact, nonfocal.    Lab Results: Results for orders placed during the hospital encounter of 03/12/11 (from the past 24 hour(s))  BASIC METABOLIC PANEL     Status: Abnormal   Collection Time   03/13/11 12:50 PM      Component Value Range   Sodium 123 (*) 135 - 145 (mEq/L)   Potassium 4.1  3.5 - 5.1 (mEq/L)   Chloride 87 (*) 96 - 112 (mEq/L)   CO2 28  19 - 32 (mEq/L)   Glucose, Bld 131 (*) 70 - 99 (mg/dL)   BUN 22  6 - 23 (mg/dL)   Creatinine, Ser 0.86  0.50 - 1.35 (mg/dL)   Calcium 8.7  8.4 - 57.8 (mg/dL)   GFR calc non Af Amer 82 (*) >90 (mL/min)   GFR calc Af Amer >90  >90 (mL/min)  BASIC METABOLIC PANEL     Status: Abnormal   Collection Time   03/13/11  6:35 PM      Component Value Range   Sodium 123 (*) 135 - 145 (mEq/L)   Potassium 4.3  3.5 - 5.1 (mEq/L)   Chloride 89 (*) 96 - 112 (mEq/L)   CO2 28  19 - 32 (mEq/L)   Glucose, Bld 121 (*) 70 - 99 (mg/dL)   BUN 22  6 - 23 (mg/dL)   Creatinine, Ser 4.69  0.50 - 1.35 (mg/dL)   Calcium  8.5  8.4 - 10.5 (mg/dL)   GFR calc non Af Amer 83 (*) >90 (mL/min)   GFR calc Af Amer >90  >90 (mL/min)  BASIC METABOLIC PANEL     Status: Abnormal   Collection Time   03/14/11  4:14 AM      Component Value Range   Sodium 123 (*) 135 - 145 (mEq/L)   Potassium 4.0  3.5 - 5.1 (mEq/L)   Chloride 90 (*) 96 - 112 (mEq/L)   CO2 26  19 - 32 (mEq/L)   Glucose, Bld 107 (*) 70 - 99 (mg/dL)   BUN 24 (*) 6 - 23 (mg/dL)   Creatinine, Ser 6.29  0.50 - 1.35 (mg/dL)   Calcium 8.5  8.4 - 52.8 (mg/dL)   GFR calc non Af Amer 89 (*) >90 (mL/min)   GFR calc Af Amer >90  >90 (mL/min)  CBC     Status: Abnormal   Collection Time   03/14/11  4:14 AM  Component Value Range   WBC 29.3 (*) 4.0 - 10.5 (K/uL)   RBC 3.25 (*) 4.22 - 5.81 (MIL/uL)   Hemoglobin 10.2 (*) 13.0 - 17.0 (g/dL)   HCT 84.6 (*) 96.2 - 52.0 (%)   MCV 89.5  78.0 - 100.0 (fL)   MCH 31.4  26.0 - 34.0 (pg)   MCHC 35.1  30.0 - 36.0 (g/dL)   RDW 95.2  84.1 - 32.4 (%)   Platelets 332  150 - 400 (K/uL)     Micro: No results found for this or any previous visit (from the past 240 hour(s)).  Studies/Results: No results found.  Medications:      . piperacillin-tazobactam (ZOSYN)  IV  3.375 g Intravenous Q8H  . polyethylene glycol  17 g Oral Daily  . senna-docusate  1 tablet Oral BID    Assessment: Impression:  1. Acute cholecystitis.  2. Hyponatremia.  3. Hypertension.  4. Status post recent right total knee replacement revision.  5. Coronary artery disease, stable.  6. Leukocytosis  Plan patient to undergo cholecystectomy today. No obvious contraindication for cardiology. Sinus tachycardia likely secondary to pain. If blood pressure stable consider perioperative beta blocker. Continue with IV antibiotics. Monitor sodium. Patient placed on a fluid restriction. Chest x-ray ordered and pending. We'll also check a pro BNP.       LOS: 2 days   Steven [name] 03/14/2011, 11:19 AM

## 2011-03-15 ENCOUNTER — Other Ambulatory Visit: Payer: Self-pay

## 2011-03-15 LAB — CBC
MCV: 91.2 fL (ref 78.0–100.0)
RBC: 3.06 MIL/uL — ABNORMAL LOW (ref 4.22–5.81)
RDW: 13.9 % (ref 11.5–15.5)

## 2011-03-15 LAB — COMPREHENSIVE METABOLIC PANEL
ALT: 21 U/L (ref 0–53)
AST: 39 U/L — ABNORMAL HIGH (ref 0–37)
Albumin: 1.9 g/dL — ABNORMAL LOW (ref 3.5–5.2)
Alkaline Phosphatase: 110 U/L (ref 39–117)
CO2: 26 mEq/L (ref 19–32)
Chloride: 94 mEq/L — ABNORMAL LOW (ref 96–112)
Creatinine, Ser: 0.8 mg/dL (ref 0.50–1.35)
GFR calc non Af Amer: 89 mL/min — ABNORMAL LOW (ref >90)
Potassium: 4.1 mEq/L (ref 3.5–5.1)
Sodium: 126 mEq/L — ABNORMAL LOW (ref 135–145)
Total Bilirubin: 0.7 mg/dL (ref 0.3–1.2)

## 2011-03-15 MED ORDER — LISINOPRIL 10 MG PO TABS
10.0000 mg | ORAL_TABLET | Freq: Every day | ORAL | Status: DC
  Administered 2011-03-15: 10 mg via ORAL
  Filled 2011-03-15: qty 1

## 2011-03-15 MED ORDER — DILTIAZEM LOAD VIA INFUSION
10.0000 mg | Freq: Once | INTRAVENOUS | Status: AC
  Administered 2011-03-15: 10 mg via INTRAVENOUS
  Filled 2011-03-15: qty 10

## 2011-03-15 MED ORDER — HYDROCHLOROTHIAZIDE 12.5 MG PO CAPS
12.5000 mg | ORAL_CAPSULE | Freq: Every day | ORAL | Status: DC
  Administered 2011-03-15: 12.5 mg via ORAL
  Filled 2011-03-15: qty 1

## 2011-03-15 MED ORDER — DILTIAZEM HCL 100 MG IV SOLR
10.0000 mg/h | INTRAVENOUS | Status: DC
  Administered 2011-03-15: 10 mg/h via INTRAVENOUS
  Filled 2011-03-15: qty 100

## 2011-03-15 MED ORDER — HEPARIN SODIUM (PORCINE) 5000 UNIT/ML IJ SOLN
5000.0000 [IU] | Freq: Three times a day (TID) | INTRAMUSCULAR | Status: DC
  Administered 2011-03-15 – 2011-03-20 (×14): 5000 [IU] via SUBCUTANEOUS
  Filled 2011-03-15 (×20): qty 1

## 2011-03-15 MED ORDER — LISINOPRIL 20 MG PO TABS: 20.0000 mg | ORAL_TABLET | Freq: Every day | ORAL | Status: DC

## 2011-03-15 MED ORDER — DILTIAZEM HCL 100 MG IV SOLR
10.0000 mg/h | INTRAVENOUS | Status: DC
  Administered 2011-03-15 – 2011-03-19 (×7): 10 mg/h via INTRAVENOUS
  Filled 2011-03-15 (×8): qty 100

## 2011-03-15 MED ORDER — LISINOPRIL 10 MG PO TABS
10.0000 mg | ORAL_TABLET | Freq: Every day | ORAL | Status: DC
  Administered 2011-03-16: 10 mg via ORAL
  Filled 2011-03-15 (×2): qty 1

## 2011-03-15 MED ORDER — PANTOPRAZOLE SODIUM 40 MG PO TBEC
40.0000 mg | DELAYED_RELEASE_TABLET | Freq: Every day | ORAL | Status: DC
  Administered 2011-03-15 – 2011-03-16 (×2): 40 mg via ORAL
  Filled 2011-03-15 (×3): qty 1

## 2011-03-15 MED ORDER — MORPHINE SULFATE 15 MG PO TABS
15.0000 mg | ORAL_TABLET | ORAL | Status: DC | PRN
  Administered 2011-03-15 – 2011-03-21 (×10): 15 mg via ORAL
  Filled 2011-03-15 (×10): qty 1

## 2011-03-15 MED ORDER — DILTIAZEM LOAD VIA INFUSION
10.0000 mg | Freq: Once | INTRAVENOUS | Status: DC
  Filled 2011-03-15: qty 10

## 2011-03-15 NOTE — Progress Notes (Signed)
Subjective: He is resting.  His wife is at the bedside.  Physical Exam: Blood pressure 145/79, pulse 109, temperature 99.2 F (37.3 C), temperature source Oral, resp. rate 20, height 5\' 10"  (1.778 m), weight 128.595 kg (283 lb 8 oz), SpO2 91.00%.   GEN: NAD ZO:XWRUE with fast rate ABD: pos BS EXT: no edema cast RLE    Basic Metabolic Panel:  Basename 03/15/11 0505 03/14/11 0414  NA 126* 123*  K 4.1 4.0  CL 94* 90*  CO2 26 26  GLUCOSE 119* 107*  BUN 24* 24*  CREATININE 0.80 0.80  CALCIUM 8.4 8.5  MG -- --  PHOS -- --   Liver Function Tests:  [facility] 03/15/11 0505  AST 39*  ALT 21  ALKPHOS 110  BILITOT 0.7  PROT 5.5*  ALBUMIN 1.9*     CBC:  Basename 03/15/11 0505 03/14/11 0414  WBC 25.2* 29.3*  NEUTROABS -- --  HGB 9.3* 10.2*  HCT 27.9* 29.1*  MCV 91.2 89.5  PLT 348 332    Chest 2 View  03/14/2011  *RADIOLOGY REPORT*  Clinical Data: Preoperative exam prior to cholecystectomy  CHEST - 2 VIEW  Comparison: 03/11/2010  Findings: Lung volumes are extremely low, suboptimal visualization due to body habitus as well.  Bibasilar atelectasis noted.  Heart size is upper limits of normal. Aorta is ectatic and unfolded.  No acute osseous finding.  IMPRESSION: Extremely low lung volumes with bibasilar atelectasis.  Original Report Authenticated By: Harrel Lemon, M.D.      Medications: I have reviewed the patient's current medications.  Impression:  1. Acute cholecystitis. S/p cholecystectomy.  Continue IV abx per surg rec.  2. Hyponatremia. Improving.  Continue IVF.   Would not restart HCTZ until hyponatremia improves.  3. Hypertension. D/c HCTZ  on Lisinopril and Cardiazem  4. Status post recent right total knee replacement revision. As per ortho  5. Coronary artery disease, stable.  Stable  6. Leukocytosis Improving, secondary to number 1.  Continue IV ABX.   7. Afib/RVR Started on Cardizem drip    LOS: 3 days   Earlene Plater MD, [place] 03/15/2011, 3:03 PM

## 2011-03-15 NOTE — Progress Notes (Signed)
ANTIBIOTIC CONSULT NOTE - FOLLOW UP  Pharmacy Consult for Zosyn Indication: s/p gangrenous cholecystectomy 11/30  Allergies  Allergen Reactions  . Oxycodone-Acetaminophen Rash and Other (See Comments)    Felt like he was hot    Patient Measurements: Height: 5\' 10"  (177.8 cm) Weight: 283 lb 8 oz (128.595 kg) (with immobilizer on) IBW/kg (Calculated) : 73   Vital Signs: Temp: 97.6 F (36.4 C) (12/01 1415) Temp src: Oral (12/01 1415) BP: 108/73 mmHg (12/01 1415) Pulse Rate: 119  (12/01 1415) Intake/Output from previous day: 11/30 0701 - 12/01 0700 In: 3285 [I.V.:2975; IV Piggyback:45] Out: 700 [Urine:700] Intake/Output from this shift: Total I/O In: -  Out: 575 [Urine:575]  Labs:  [facility] 03/15/11 0505 03/14/11 0414 03/13/11 1835  WBC 25.2* 29.3* --  HGB 9.3* 10.2* --  PLT 348 332 --  LABCREA -- -- --  CREATININE 0.80 0.80 0.94   Estimated Creatinine Clearance: 117.3 ml/min (by C-G formula based on Cr of 0.8).   Microbiology: Recent Results (from the past 720 hour(s))  SURGICAL PCR SCREEN     Status: Normal   Collection Time   02/27/11 10:40 AM      Component Value Range Status Comment   MRSA, PCR NEGATIVE  NEGATIVE  Final    Staphylococcus aureus NEGATIVE  NEGATIVE  Final     Anti-infectives     Start     Dose/Rate Route Frequency Ordered Stop   03/12/11 0900  piperacillin-tazobactam (ZOSYN) IVPB 3.375 g       3.375 g 12.5 mL/hr over 240 Minutes Intravenous Every 8 hours 03/12/11 0741     03/12/11 0545   Ampicillin-Sulbactam (UNASYN) 3 g in sodium chloride 0.9 % 100 mL IVPB        3 g 100 mL/hr over 60 Minutes Intravenous  Once 03/12/11 0541 03/12/11 0653          Assessment: Day #4 Zosyn EI for abdominal coverage. SCr remains stable, WBC trending down. Plan to continue IV abx x 2-3 more days noted in surgeon's note.  Plan:  Continue Zosyn EI dosing.  Annia Belt 03/15/2011,3:46 PM

## 2011-03-15 NOTE — Progress Notes (Signed)
1 Day Post-Op  Subjective: Alert. Has been up to chair. Not ambulating much. No bowel movement. No nausea. Tolerating clear liquids. Denies shortness of breath. Abdomen sore.  Vital signs stable. Heart rate 109. Has been seen by Dr. Donnie Aho.  Is being followed by orthopedics. They have allowed weight-bearing as tolerated.  Objective: Vital signs in last 24 hours: Temp:  [98.2 F (36.8 C)-100.9 F (38.3 C)] 99.2 F (37.3 C) (12/01 0654) Pulse Rate:  [93-124] 109  (12/01 0654) Resp:  [18-22] 20  (12/01 0654) BP: (121-145)/(79-96) 145/79 mmHg (12/01 0654) SpO2:  [91 %-100 %] 91 % (12/01 0654) FiO2 (%):  [2 %] 2 % (11/30 1915) Last BM Date: 03/10/11  Intake/Output from previous day: 11/30 0701 - 12/01 0700 In: 3285 [I.V.:2975; IV Piggyback:45] Out: 700 [Urine:700] Intake/Output this shift: Total I/O In: -  Out: 225 [Urine:225]  Resp: clear to auscultation bilaterally GI: abdomen obese. Soft. Appropriately sore. Minimal bowel sounds. Not distended. Drainage is serosanguineous. No bile in drain. Extremities: right knee with recent incision and staples in place. The edematous but no sign of infection.  Lab Results:  Results for orders placed during the hospital encounter of 03/12/11 (from the past 24 hour(s))  COMPREHENSIVE METABOLIC PANEL     Status: Abnormal   Collection Time   03/15/11  5:05 AM      Component Value Range   Sodium 126 (*) 135 - 145 (mEq/L)   Potassium 4.1  3.5 - 5.1 (mEq/L)   Chloride 94 (*) 96 - 112 (mEq/L)   CO2 26  19 - 32 (mEq/L)   Glucose, Bld 119 (*) 70 - 99 (mg/dL)   BUN 24 (*) 6 - 23 (mg/dL)   Creatinine, Ser 1.61  0.50 - 1.35 (mg/dL)   Calcium 8.4  8.4 - 09.6 (mg/dL)   Total Protein 5.5 (*) 6.0 - 8.3 (g/dL)   Albumin 1.9 (*) 3.5 - 5.2 (g/dL)   AST 39 (*) 0 - 37 (U/L)   ALT 21  0 - 53 (U/L)   Alkaline Phosphatase 110  39 - 117 (U/L)   Total Bilirubin 0.7  0.3 - 1.2 (mg/dL)   GFR calc non Af Amer 89 (*) >90 (mL/min)   GFR calc Af Amer >90  >90  (mL/min)  CBC     Status: Abnormal   Collection Time   03/15/11  5:05 AM      Component Value Range   WBC 25.2 (*) 4.0 - 10.5 (K/uL)   RBC 3.06 (*) 4.22 - 5.81 (MIL/uL)   Hemoglobin 9.3 (*) 13.0 - 17.0 (g/dL)   HCT 04.5 (*) 40.9 - 52.0 (%)   MCV 91.2  78.0 - 100.0 (fL)   MCH 30.4  26.0 - 34.0 (pg)   MCHC 33.3  30.0 - 36.0 (g/dL)   RDW 81.1  91.4 - 78.2 (%)   Platelets 348  150 - 400 (K/uL)     Studies/Results: @RISRSLT24 @     . heparin subcutaneous  5,000 Units Subcutaneous Q8H  . hydrochlorothiazide  12.5 mg Oral Daily  . lisinopril  10 mg Oral Daily  . pantoprazole  40 mg Oral Q1200  . piperacillin-tazobactam (ZOSYN)  IV  3.375 g Intravenous Q8H  . polyethylene glycol  17 g Oral Daily  . senna-docusate  1 tablet Oral BID     Assessment/Plan: s/p Procedure(s): LAPAROSCOPIC CHOLECYSTECTOMY Surgery for gangrenous cholecystitis yesterday. He is stable but needs to be on intravenous antibiotics for another 2 or 3 days because of severity of  infection.  Leave drain in place.  Hypertension. I have restarted his lisinopril, hydrochlorothiazide.  GERD. I have restarted his protonix..  Chronic constipation. On daily MiraLAX. Await resumption of bowel function.  Recent right total knee replacement. Being followed by Dr. Merlyn Albert. Weightbearing as tolerated.  DVT prophylaxis. I will start subcutaneous heparin today.  Will advance  diet to full liquids   LOS: 3 days    Steven [name] 03/15/2011  . .prob

## 2011-03-15 NOTE — Progress Notes (Signed)
Patient back from PACU, alert and oriented Incision intact. JP drain emptying light bloody output. Pt C/O surgical pain dilaudid IV and morpine given to control pain. Tolerated clear liq intake, Able to stand and void in urinal.  Will continue to assess patient

## 2011-03-15 NOTE — Progress Notes (Signed)
Subjective:  Patient tolerated the surgery well. He has a known occluded circumflex with collaterals and is treated medically. He has had no chest pain or palpitations or shortness of breath.  Objective:  Vital Signs in the last 24 hours: BP 145/79  Pulse 109  Temp(Src) 99.2 F (37.3 C) (Oral)  Resp 20  Ht 5\' 10"  (1.778 m)  Wt 128.595 kg (283 lb 8 oz)  BMI 40.68 kg/m2  SpO2 91%  Physical Exam: Pleasant obese male currently in no acute distress. Lungs:  Clear  Cardiac:  Regular rhythm, normal S1 and S2, no S3, 2/6 systolic murmur at left sternal border Abdomen: Mildly tender Extremities:  No edema present  Intake/Output from previous day: 11/30 0701 - 12/01 0700 In: 3285 [I.V.:2975; IV Piggyback:45] Out: 700 [Urine:700]  Lab Results: Basic Metabolic Panel:  [facility] 03/15/11 0505 03/14/11 0414  NA 126* 123*  K 4.1 4.0  CL 94* 90*  CO2 26 26  GLUCOSE 119* 107*  BUN 24* 24*  CREATININE 0.80 0.80    CBC:  Basename 03/15/11 0505 03/14/11 0414  WBC 25.2* 29.3*  NEUTROABS -- --  HGB 9.3* 10.2*  HCT 27.9* 29.1*  MCV 91.2 89.5  PLT 348 332    Telemetry: Reviewed : Normal sinus rhythm  Assessment/Plan:  1. Coronary artery disease treated medically with no problem since surgery 2. Hyponatremia 3. Recent cholecystectomy  Recommendations:  Follow hyponatremia. No active cardiovascular problems and will see as needed. Have him followup routinely with Dr. Excell Seltzer.     Darden Palmer.  MD [facility] 03/15/2011, 9:02 AM

## 2011-03-16 ENCOUNTER — Inpatient Hospital Stay (HOSPITAL_COMMUNITY): Payer: Medicare Other

## 2011-03-16 ENCOUNTER — Encounter (HOSPITAL_COMMUNITY)
Admission: EM | Disposition: A | Discharge status: Home / Self Care | Payer: Self-pay | Source: Home / Self Care | Attending: Internal Medicine

## 2011-03-16 ENCOUNTER — Encounter (HOSPITAL_COMMUNITY): Payer: Self-pay | Admitting: *Deleted

## 2011-03-16 ENCOUNTER — Inpatient Hospital Stay (HOSPITAL_COMMUNITY): Payer: Medicare Other | Admitting: *Deleted

## 2011-03-16 DIAGNOSIS — K430 Incisional hernia with obstruction, without gangrene: Secondary | ICD-10-CM

## 2011-03-16 HISTORY — PX: LAPAROSCOPY: SHX197

## 2011-03-16 LAB — TSH: TSH: 0.384 u[IU]/mL (ref 0.350–4.500)

## 2011-03-16 LAB — CBC
HCT: 29.7 % — ABNORMAL LOW (ref 39.0–52.0)
Hemoglobin: 10.1 g/dL — ABNORMAL LOW (ref 13.0–17.0)
MCH: 30.9 pg (ref 26.0–34.0)
MCHC: 34 g/dL (ref 30.0–36.0)
MCV: 90.8 fL (ref 78.0–100.0)
RBC: 3.27 MIL/uL — ABNORMAL LOW (ref 4.22–5.81)

## 2011-03-16 LAB — BASIC METABOLIC PANEL
BUN: 19 mg/dL (ref 6–23)
CO2: 27 mEq/L (ref 19–32)
Calcium: 8.5 mg/dL (ref 8.4–10.5)
Creatinine, Ser: 0.8 mg/dL (ref 0.50–1.35)
GFR calc non Af Amer: 89 mL/min — ABNORMAL LOW (ref >90)
Glucose, Bld: 118 mg/dL — ABNORMAL HIGH (ref 70–99)
Sodium: 125 mEq/L — ABNORMAL LOW (ref 135–145)

## 2011-03-16 LAB — HEPATIC FUNCTION PANEL
Albumin: 1.9 g/dL — ABNORMAL LOW (ref 3.5–5.2)
Alkaline Phosphatase: 97 U/L (ref 39–117)
Bilirubin, Direct: 0.4 mg/dL — ABNORMAL HIGH (ref 0.0–0.3)
Total Bilirubin: 0.8 mg/dL (ref 0.3–1.2)

## 2011-03-16 LAB — LIPASE, BLOOD: Lipase: 15 U/L (ref 11–59)

## 2011-03-16 SURGERY — LAPAROSCOPY, DIAGNOSTIC
Anesthesia: General | Site: Abdomen | Wound class: Clean

## 2011-03-16 MED ORDER — ONDANSETRON HCL 4 MG/2ML IJ SOLN
INTRAMUSCULAR | Status: DC | PRN
  Administered 2011-03-16: 4 mg via INTRAVENOUS

## 2011-03-16 MED ORDER — PANTOPRAZOLE SODIUM 40 MG PO TBEC: 40.0000 mg | DELAYED_RELEASE_TABLET | Freq: Every day | ORAL | Status: DC

## 2011-03-16 MED ORDER — LACTATED RINGERS IR SOLN
Status: DC | PRN
  Administered 2011-03-16: 3000 mL

## 2011-03-16 MED ORDER — LIDOCAINE HCL (CARDIAC) 20 MG/ML IV SOLN
INTRAVENOUS | Status: DC | PRN
  Administered 2011-03-16: 50 mg via INTRAVENOUS

## 2011-03-16 MED ORDER — PHENYLEPHRINE HCL 10 MG/ML IJ SOLN
INTRAMUSCULAR | Status: DC | PRN
  Administered 2011-03-16: 40 ug via INTRAVENOUS
  Administered 2011-03-16: 20 ug via INTRAVENOUS
  Administered 2011-03-16 (×5): 40 ug via INTRAVENOUS

## 2011-03-16 MED ORDER — LACTATED RINGERS IV SOLN: INTRAVENOUS | Status: DC

## 2011-03-16 MED ORDER — PIPERACILLIN-TAZOBACTAM 3.375 G IVPB 30 MIN
INTRAVENOUS | Status: DC | PRN
  Administered 2011-03-16: 3.375 g via INTRAVENOUS

## 2011-03-16 MED ORDER — HYDROMORPHONE HCL PF 1 MG/ML IJ SOLN
0.2500 mg | INTRAMUSCULAR | Status: DC | PRN
  Administered 2011-03-16: 0.5 mg via INTRAVENOUS

## 2011-03-16 MED ORDER — SODIUM CHLORIDE 0.9 % IR SOLN
Status: DC | PRN
  Administered 2011-03-16: 1000 mL

## 2011-03-16 MED ORDER — FENTANYL CITRATE 0.05 MG/ML IJ SOLN: 25.0000 ug | INTRAMUSCULAR | Status: DC | PRN

## 2011-03-16 MED ORDER — SODIUM CHLORIDE 0.9 % IV SOLN
2000.0000 mg | Freq: Once | INTRAVENOUS | Status: AC
  Administered 2011-03-16: 2000 mg via INTRAVENOUS
  Filled 2011-03-16: qty 2000

## 2011-03-16 MED ORDER — GLYCOPYRROLATE 0.2 MG/ML IJ SOLN
INTRAMUSCULAR | Status: DC | PRN
  Administered 2011-03-16: .2 mg via INTRAVENOUS

## 2011-03-16 MED ORDER — LACTATED RINGERS IV SOLN
INTRAVENOUS | Status: DC | PRN
  Administered 2011-03-16: 16:00:00 via INTRAVENOUS

## 2011-03-16 MED ORDER — FUROSEMIDE 10 MG/ML IJ SOLN: 20.0000 mg | Freq: Once | INTRAMUSCULAR | Status: DC

## 2011-03-16 MED ORDER — PROMETHAZINE HCL 25 MG/ML IJ SOLN: 6.2500 mg | INTRAMUSCULAR | Status: DC | PRN

## 2011-03-16 MED ORDER — POTASSIUM CHLORIDE CRYS ER 20 MEQ PO TBCR
40.0000 meq | EXTENDED_RELEASE_TABLET | Freq: Once | ORAL | Status: DC
  Filled 2011-03-16: qty 2

## 2011-03-16 MED ORDER — LACTATED RINGERS IV SOLN
INTRAVENOUS | Status: DC | PRN
  Administered 2011-03-16 (×2): via INTRAVENOUS

## 2011-03-16 MED ORDER — SODIUM CHLORIDE 0.9 % IV SOLN
INTRAVENOUS | Status: DC | PRN
  Administered 2011-03-16: 17:00:00 via INTRAVENOUS

## 2011-03-16 MED ORDER — EPHEDRINE SULFATE 50 MG/ML IJ SOLN
INTRAMUSCULAR | Status: DC | PRN
  Administered 2011-03-16 (×2): 5 mg via INTRAVENOUS

## 2011-03-16 MED ORDER — IOHEXOL 300 MG/ML  SOLN
125.0000 mL | Freq: Once | INTRAMUSCULAR | Status: AC | PRN
  Administered 2011-03-16: 125 mL via INTRAVENOUS

## 2011-03-16 MED ORDER — SUCCINYLCHOLINE CHLORIDE 20 MG/ML IJ SOLN
INTRAMUSCULAR | Status: DC | PRN
  Administered 2011-03-16: 140 mg via INTRAVENOUS

## 2011-03-16 MED ORDER — NEOSTIGMINE METHYLSULFATE 1 MG/ML IJ SOLN
INTRAMUSCULAR | Status: DC | PRN
  Administered 2011-03-16: 1 mg via INTRAVENOUS

## 2011-03-16 MED ORDER — SODIUM CHLORIDE 0.9 % IV SOLN
INTRAVENOUS | Status: DC
  Administered 2011-03-16: 130 mL/h via INTRAVENOUS
  Administered 2011-03-19: 08:00:00 via INTRAVENOUS

## 2011-03-16 MED ORDER — ENOXAPARIN SODIUM 40 MG/0.4ML ~~LOC~~ SOLN: 40.0000 mg | SUBCUTANEOUS | Status: DC

## 2011-03-16 MED ORDER — SODIUM CHLORIDE 0.9 % IV SOLN
INTRAVENOUS | Status: DC
  Administered 2011-03-16: 18:00:00 via INTRAVENOUS

## 2011-03-16 MED ORDER — CISATRACURIUM BESYLATE 2 MG/ML IV SOLN
INTRAVENOUS | Status: DC | PRN
  Administered 2011-03-16: 4 mg via INTRAVENOUS
  Administered 2011-03-16: 6 mg via INTRAVENOUS

## 2011-03-16 MED ORDER — BUPIVACAINE HCL 0.25 % IJ SOLN
INTRAMUSCULAR | Status: DC | PRN
  Administered 2011-03-16: 30 mL

## 2011-03-16 MED ORDER — ETOMIDATE 2 MG/ML IV SOLN
INTRAVENOUS | Status: DC | PRN
  Administered 2011-03-16: 20 mg via INTRAVENOUS

## 2011-03-16 MED ORDER — VANCOMYCIN HCL 1000 MG IV SOLR
1250.0000 mg | Freq: Two times a day (BID) | INTRAVENOUS | Status: DC
  Administered 2011-03-17 – 2011-03-18 (×3): 1250 mg via INTRAVENOUS
  Filled 2011-03-16 (×5): qty 1250

## 2011-03-16 SURGICAL SUPPLY — 72 items
ADH SKN CLS APL DERMABOND .7 (GAUZE/BANDAGES/DRESSINGS)
APL SKNCLS STERI-STRIP NONHPOA (GAUZE/BANDAGES/DRESSINGS) ×1
APPLIER CLIP 5 13 M/L LIGAMAX5 (MISCELLANEOUS)
APPLIER CLIP ROT 10 11.4 M/L (STAPLE)
APR CLP MED LRG 11.4X10 (STAPLE)
APR CLP MED LRG 5 ANG JAW (MISCELLANEOUS)
BENZOIN TINCTURE PRP APPL 2/3 (GAUZE/BANDAGES/DRESSINGS) ×1 IMPLANT
BLADE EXTENDED COATED 6.5IN (ELECTRODE) IMPLANT
BLADE HEX COATED 2.75 (ELECTRODE) IMPLANT
BLADE SURG SZ10 CARB STEEL (BLADE) IMPLANT
CANISTER SUCTION 2500CC (MISCELLANEOUS) ×2 IMPLANT
CANNULA ENDOPATH XCEL 11M (ENDOMECHANICALS) IMPLANT
CLIP APPLIE 5 13 M/L LIGAMAX5 (MISCELLANEOUS) IMPLANT
CLIP APPLIE ROT 10 11.4 M/L (STAPLE) IMPLANT
CLOTH BEACON ORANGE TIMEOUT ST (SAFETY) ×2 IMPLANT
COVER MAYO STAND STRL (DRAPES) ×1 IMPLANT
DECANTER SPIKE VIAL GLASS SM (MISCELLANEOUS) ×2 IMPLANT
DERMABOND ADVANCED (GAUZE/BANDAGES/DRESSINGS)
DERMABOND ADVANCED .7 DNX12 (GAUZE/BANDAGES/DRESSINGS) IMPLANT
DEVICE TROCAR PUNCTURE CLOSURE (ENDOMECHANICALS) ×1 IMPLANT
DRAPE LAPAROSCOPIC ABDOMINAL (DRAPES) ×2 IMPLANT
DRAPE WARM FLUID 44X44 (DRAPE) IMPLANT
ELECT REM PT RETURN 9FT ADLT (ELECTROSURGICAL) ×2
ELECTRODE REM PT RTRN 9FT ADLT (ELECTROSURGICAL) ×1 IMPLANT
FILTER SMOKE EVAC LAPAROSHD (FILTER) IMPLANT
GLOVE BIO SURGEON STRL SZ7 (GLOVE) ×2 IMPLANT
GLOVE BIOGEL M 7.0 STRL (GLOVE) ×2 IMPLANT
GLOVE BIOGEL PI IND STRL 7.0 (GLOVE) ×1 IMPLANT
GLOVE BIOGEL PI INDICATOR 7.0 (GLOVE) ×1
GLOVE SURG SS PI 7.5 STRL IVOR (GLOVE) ×4 IMPLANT
GOWN PREVENTION PLUS LG XLONG (DISPOSABLE) ×2 IMPLANT
GOWN STRL NON-REIN LRG LVL3 (GOWN DISPOSABLE) ×2 IMPLANT
GOWN STRL REIN XL XLG (GOWN DISPOSABLE) ×4 IMPLANT
KIT BASIN OR (CUSTOM PROCEDURE TRAY) ×2 IMPLANT
LIGASURE IMPACT 36 18CM CVD LR (INSTRUMENTS) IMPLANT
NS IRRIG 1000ML POUR BTL (IV SOLUTION) ×2 IMPLANT
PENCIL BUTTON HOLSTER BLD 10FT (ELECTRODE) ×1 IMPLANT
SCALPEL HARMONIC ACE (MISCELLANEOUS) IMPLANT
SET IRRIG TUBING LAPAROSCOPIC (IRRIGATION / IRRIGATOR) ×1 IMPLANT
SLEEVE Z-THREAD 5X100MM (TROCAR) ×1 IMPLANT
SOLUTION ANTI FOG 6CC (MISCELLANEOUS) ×2 IMPLANT
SPONGE GAUZE 4X4 12PLY (GAUZE/BANDAGES/DRESSINGS) ×2 IMPLANT
SPONGE LAP 18X18 X RAY DECT (DISPOSABLE) IMPLANT
STAPLER VISISTAT 35W (STAPLE) IMPLANT
STRIP CLOSURE SKIN 1/2X4 (GAUZE/BANDAGES/DRESSINGS) ×1 IMPLANT
STRIP CLOSURE SKIN 1/4X4 (GAUZE/BANDAGES/DRESSINGS) ×1 IMPLANT
SUCTION POOLE TIP (SUCTIONS) IMPLANT
SUT MNCRL AB 4-0 PS2 18 (SUTURE) ×1 IMPLANT
SUT PDS AB 1 TP1 96 (SUTURE) IMPLANT
SUT PROLENE 2 0 KS (SUTURE) IMPLANT
SUT PROLENE 2 0 SH DA (SUTURE) IMPLANT
SUT SILK 2 0 (SUTURE)
SUT SILK 2 0 SH CR/8 (SUTURE) IMPLANT
SUT SILK 2-0 18XBRD TIE 12 (SUTURE) IMPLANT
SUT SILK 3 0 (SUTURE)
SUT SILK 3 0 SH CR/8 (SUTURE) IMPLANT
SUT SILK 3-0 18XBRD TIE 12 (SUTURE) IMPLANT
SUT VICRYL 0 UR6 27IN ABS (SUTURE) ×4 IMPLANT
SYS LAPSCP GELPORT 120MM (MISCELLANEOUS)
SYSTEM LAPSCP GELPORT 120MM (MISCELLANEOUS) IMPLANT
TOWEL OR 17X26 10 PK STRL BLUE (TOWEL DISPOSABLE) ×2 IMPLANT
TRAY FOLEY CATH 14FRSI W/METER (CATHETERS) ×2 IMPLANT
TRAY LAP CHOLE (CUSTOM PROCEDURE TRAY) ×2 IMPLANT
TROCAR ADV FIXATION 11X100MM (TROCAR) ×1 IMPLANT
TROCAR BLADELESS OPT 5 75 (ENDOMECHANICALS) ×2 IMPLANT
TROCAR XCEL 12X100 BLDLESS (ENDOMECHANICALS) IMPLANT
TROCAR XCEL BLUNT TIP 100MML (ENDOMECHANICALS) IMPLANT
TROCAR XCEL NON-BLD 11X100MML (ENDOMECHANICALS) IMPLANT
TROCAR Z-THREAD FIOS 5X100MM (TROCAR) ×1 IMPLANT
TUBING INSUFFLATION 10FT LAP (TUBING) ×2 IMPLANT
YANKAUER SUCT BULB TIP 10FT TU (MISCELLANEOUS) ×1 IMPLANT
YANKAUER SUCT BULB TIP NO VENT (SUCTIONS) IMPLANT

## 2011-03-16 NOTE — Progress Notes (Signed)
Pt. Transferred to OR at 1430 for surgery per MD orders.  Family at bedside and aware of transfer.

## 2011-03-16 NOTE — Brief Op Note (Signed)
03/12/2011 - 03/16/2011  5:51 PM  PATIENT:  Steven [name]  70 y.o. male  PRE-OPERATIVE DIAGNOSIS:  bowel obstruction  POST-OPERATIVE DIAGNOSIS:  bowel obstruction  PROCEDURE:  Procedure(s): LAPAROSCOPY DIAGNOSTIC with incisional hernia repair for bowel obstruction at trocar site hernia  SURGEON:  Surgeon(s): Rulon Abide, DO  PHYSICIAN ASSISTANT:   ASSISTANTS: none   ANESTHESIA:   general  EBL:  Total I/O In: 3000 [I.V.:2935; Other:15; IV Piggyback:50] Out: 2925 [Urine:125; Other:2800]  BLOOD ADMINISTERED:none  DRAINS: none   LOCAL MEDICATIONS USED:  MARCAINE 25CC  SPECIMEN:  No Specimen  DISPOSITION OF SPECIMEN:  N/A  COUNTS:  YES  TOURNIQUET:  * No tourniquets in log *  DICTATION: .Other Dictation: Dictation Number [id number]  PLAN OF CARE: Admit to inpatient   PATIENT DISPOSITION:  ICU - extubated and stable.   Delay start of Pharmacological VTE agent (>24hrs) due to surgical blood loss or risk of bleeding:  {YES/NO/NOT APPLICABLE:20182

## 2011-03-16 NOTE — Progress Notes (Signed)
ANTIBIOTIC CONSULT NOTE - INITIAL Pharmacy Consult for Vancomycin Indication: suspected pneumonia  Allergies  Allergen Reactions  . Oxycodone-Acetaminophen Rash and Other (See Comments)    Felt like he was hot    Patient Measurements: Height: 5\' 10"  (177.8 cm) Weight: 283 lb 8 oz (128.595 kg) (with immobilizer on) IBW/kg (Calculated) : 73    Vital Signs: Temp: 98.1 F (36.7 C) (12/02 0510) Temp src: Oral (12/02 0510) BP: 113/68 mmHg (12/02 0510) Pulse Rate: 90  (12/02 0510) Intake/Output from previous day: 12/01 0701 - 12/02 0700 In: 2430 [I.V.:2380; IV Piggyback:50] Out: 948 [Urine:875; Drains:15] Intake/Output from this shift:    Labs:  [facility] 03/16/11 0533 03/15/11 0505 03/14/11 0414  WBC 23.4* 25.2* 29.3*  HGB 10.1* 9.3* 10.2*  PLT 428* 348 332  LABCREA -- -- --  CREATININE 0.80 0.80 0.80   Estimated Creatinine Clearance: 117.3 ml/min (by C-G formula based on Cr of 0.8).   Microbiology: Recent Results (from the past 720 hour(s))  SURGICAL PCR SCREEN     Status: Normal   Collection Time   02/27/11 10:40 AM      Component Value Range Status Comment   MRSA, PCR NEGATIVE  NEGATIVE  Final    Staphylococcus aureus NEGATIVE  NEGATIVE  Final     Medical History: Past Medical History  Diagnosis Date  . Obesity   . Dyslipidemia   . Coronary artery disease 02-27-11    Dr. Marliss Coots follows-has some blocked coronary arteries ,not suitable for stent  placement  . Hypertension   . Shingles outbreak 02-27-11    2 weeks ago , was tx.-only residual is tenderness of right scalp-no open areas  . GERD (gastroesophageal reflux disease) 02-27-11    Acid reflux  . Hemorrhoids 02-27-11    not bothersome at this time  . Hearing loss 02-27-11    Bilateral hearing aids-due to exposure to loud machinery  . Urinary incontinence 02-27-11    not an everday occurrence-no special measures  . Osteoarthritis 02-27-11    Ostearthritis-knees, shoulders.Back causes  chronic pain-radiates down right leg  . Chronic pain     Medications:  Scheduled:    . furosemide  20 mg Intravenous Once  . heparin subcutaneous  5,000 Units Subcutaneous Q8H  . lisinopril  10 mg Oral Daily  . pantoprazole  40 mg Oral Q1200  . piperacillin-tazobactam (ZOSYN)  IV  3.375 g Intravenous Q8H  . polyethylene glycol  17 g Oral Daily  . potassium chloride  40 mEq Oral Once  . senna-docusate  1 tablet Oral BID  . DISCONTD: hydrochlorothiazide  12.5 mg Oral Daily  . DISCONTD: lisinopril  10 mg Oral Daily  . DISCONTD: lisinopril  20 mg Oral Daily   Infusions:    . sodium chloride 100 mL/hr at 03/15/11 2237  . diltiazem (CARDIZEM) infusion 10 mg/hr (03/15/11 2110)  . DISCONTD: lactated ringers 100 mL/hr at 03/14/11 2315   PRN: acetaminophen, acetaminophen, albuterol, HYDROmorphone, iohexol, morphine, ondansetron (ZOFRAN) IV, ondansetron, promethazine, promethazine  Assessment: Adding Vancomycin to Zosyn coverage for r/o pneumonia  Goal of Therapy:  Vancomycin trough level 15-20 mcg/ml  Plan:  -Vancomycin 2000 mg IV loading dose x 1, then . . . -Vancomycin 1250 mg IV q12h maintenance dose. -Check trough at steady state. -Continue Zosyn.  Elie Goody, Pharm.D.  [phone] 03/16/2011 2:24 PM

## 2011-03-16 NOTE — Preoperative (Signed)
Beta Blockers   Reason not to administer Beta Blockers:Not Applicable 

## 2011-03-16 NOTE — Progress Notes (Signed)
Subjective:  The patient went into atrial fibrillation yesterday with rapid response about 11:30 in the morning. He was started on diltiazem but remains in atrial fibrillation although the rate is better controlled. He is not currently having any dyspnea. He complains of knee pain where he had his knee replacement on the 21st. He is not having any chest pain or shortness of breath.  Objective:  Vital Signs in the last 24 hours: BP 113/68  Pulse 90  Temp(Src) 98.1 F (36.7 C) (Oral)  Resp 18  Ht 5\' 10"  (1.778 m)  Wt 128.595 kg (283 lb 8 oz)  BMI 40.68 kg/m2  SpO2 94%  Physical Exam:  Pleasant obese male currently in no acute distress.  Lungs: Clear  Cardiac: Irregular rhythm, normal S1-S2, no S3, 2/6 systolic murmur at left sternal border  Extremities: Scars from previous knee replacement on the right knee, no definite tenderness or Homans sign. No edema  Intake/Output from previous day: 12/01 0701 - 12/02 0700 In: 2430 [I.V.:2380; IV Piggyback:50] Out: 948 [Urine:875; Drains:15]  Lab Results: Basic Metabolic Panel:  [facility] 03/16/11 0533 03/15/11 0505  NA 125* 126*  K 3.5 4.1  CL 91* 94*  CO2 27 26  GLUCOSE 118* 119*  BUN 19 24*  CREATININE 0.80 0.80    CBC:  Basename 03/16/11 0533 03/15/11 0505  WBC 23.4* 25.2*  NEUTROABS -- --  HGB 10.1* 9.3*  HCT 29.7* 27.9*  MCV 90.8 91.2  PLT 428* 348   Telemetry: Atrial fibrillation with controlled response now.  Assessment/Plan:  1. Postoperative atrial fibrillation with continuance. There is concern that he could have had a pulmonary embolus and since Dr. Derrell Lolling is going to go and check his abdomen I think we should go ahead and do a CTA of the chest to exclude a pulmonary embolus. We will not be able to get a Doppler examination over the weekend and a d-dimer will not be helpful. He should continue on diltiazem at the time being and we may switch him to oral therapy. His [place] score is 1. He also should have an  echocardiogram and a TSH. 2. Coronary artery disease stable 3. Hyponatremia uncertain cause  Recommendations:  Obtain CT of chest. Continue diltiazem for rate control. Check echocardiogram and TSH if not done recently.  Darden Palmer.  MD [facility] 03/16/2011, 9:18 AM

## 2011-03-16 NOTE — Progress Notes (Addendum)
Patient ID: Steven [name], male   DOB: January 09, 1941, 70 y.o.   MRN: [id number] Steven [name] appears to have a trocar site hernia with bowel obstruction and a transition point at that site per Dr. Lowella Dandy in radiology.  Steven [name] is distended and overall not looking well but Steven [name] is not toxic or in distress.  I have recommended diagnostic laparoscopy for evaluation of this hernia and repair since Steven [name] appears to have transition point at that site.  Will plan for ICU after that.  I discussed with him the risks of the surgery including infection, bleeding, pain, scarring, hernia and recurrent hernia, need for open surgery, need for bowel resection, and the risk of negative laparoscopy and Steven [name] desires to proceed with dx laparoscopy.

## 2011-03-16 NOTE — Op Note (Signed)
NAME:  GABRIEL, CONRY NO.:  [id number]  MEDICAL RECORD NO.:  [id number]  LOCATION:  1429                         FACILITY:  [facility]  PHYSICIAN:  Lodema Pilot, MD       DATE OF BIRTH:  09-23-1940  DATE OF PROCEDURE:  03/16/2011 DATE OF DISCHARGE:                              OPERATIVE REPORT   PROCEDURE:  Diagnostic laparoscopy with primary repair of trocar site hernia or incisional hernia.  PREOPERATIVE DIAGNOSES:  Bowel obstruction and trocar site hernia.  POSTOPERATIVE DIAGNOSES:  Bowel obstruction and trocar site hernia.  SURGEON:  Lodema Pilot, M.D.  ASSISTANT:  None.  ANESTHESIA:  General endotracheal tube anesthesia.  FLUIDS:  2500 cc of crystalloid.  ESTIMATED BLOOD LOSS:  Minimal.  DRAINS:  No new drains.  SPECIMENS:  None.  COMPLICATIONS:  None apparent.  FINDINGS:  Trocar site hernia at the supraumbilical midline incision containing small bowel with a clear transition point identified at that site, the bowel was viable appearing.  No other obstruction identified. He had a small left inguinal hernia without bowel contents and right upper quadrant drain appeared to be in good position.  INDICATION FOR PROCEDURE:  Mr. Nieman is a 70 year old male who underwent laparoscopic cholecystectomy and drain placement 2 days ago for acute cholecystitis and he has failed to improve.  CT scan of the abdomen was obtained, which demonstrated a trocar site hernia at the supraumbilical incision with a clear transition point and proximal bowel obstruction.  OPERATIVE DETAILS:  Mr. Earhart was seen and evaluated in the hospital ward, and risks and benefits of the procedure were discussed in lay terms.  I reviewed the CT scan with the patient to show the problem.  He was taken to the operating room and prior to anesthetic administration the NG tube was placed for gastric decompression to minimize chance of aspiration on induction of the anesthesia.  He had a  large amount of bilious emesis at the time of NG tube placement and approximately 2 L of output from the stomach into the NG tube and on the bed.  He was on antibiotics for treatment of his gangrenous gallbladder and then was taken to the operating room and placed on the table in a supine position and general endotracheal tube anesthesia was obtained.  Then, a procedure time-out was performed with all operative team members to confirm proper patient, procedure, and his abdomen was prepped and draped in a standard surgical fashion.  Then, a supraumbilical midline incision was opened up and dissection carried down  to the subcutaneous tissue using blunt dissection.  In the subcutaneous tissue, he had a loop of small bowel, which was identified and herniating through the fascial defect.  The bowel was able to be reduced manually and he was noted to have a loose Vicryl suture, which appeared to have come untied. A 12 mm balloon port was placed through this incision, and the abdomen explored, he had ascitic fluid throughout his abdomen and the remainder of the abdominal wall appeared to be intact.  His 11 mm epigastric trocar site, appeared to be sealed internally and the 5 mm trocars appeared to be sealed as well.  I opened up his left upper quadrant, a 5 mm trocar site and placed another left lower quadrant 5 mm trocar site and the small intestine was run starting at the ileocecal valve, and was run proximal.  The distal small bowel was normal caliber and midway to the small intestine I came across the area of transition point, which appeared to have a 2 cm area of injection and bruising with nerve bands on each side of this area consistent with the site of the bowel, which was herniated through the trocar site.  Proximal to this, was dilated and fluid filled, but this was a clear transition point and the bowel appeared viable and did not need resection.  I continued to run the small bowel  all the way proximal to the ligament of Treitz and there was no evidence of obstruction.  I suctioned the ascites from the abdomen and explored the rest of the abdomen.  He had a small left inguinal hernia, indirect type, without any evidence of bowel contents or omentum herniating through this.  The right upper quadrant had definite inflammatory changes in the omentum, it covered the area of the gallbladder fossa and covered the drain, which was seen entering the gallbladder fossa and exiting through the right upper quadrant trocar site.  I then placed Kochers on the abdominal wall fascia at the supraumbilical trocar site and approximately 6 interrupted 0 Vicryl sutures were placed through the fascial edges, which were clearly seen and the fascia was well approximated and secured.  I reinsufflated the abdomen through the left upper quadrant trocar site and inspected the abdominal wall closure, it appeared to be airtight.  Using an Endoclose device and a 0 Vicryl suture, I placed another figure-of-8 suture around this area to further close the peritoneum at the area of the hernia. This was done under direct visualization and closed the peritoneum as well.  The suture was secured and the left lower quadrant trocar was removed under direct visualization, the abdominal wall was noted to be hemostatic.  The 5 mm trocar was removed and the wound was irrigated with sterile saline solution, the wounds were injected with a total of 30 cc of 0.25% Marcaine plain.  The skin edges were approximated with interrupted 4-0 Monocryl sutures and benzoin and Steri-Strips were applied.  All sponge, needle, instrument counts were correct at the end of the case and the patient tolerated procedure well without apparent complication.  The Foley catheter was left at the end of the case, and the patient was ready for transfer to the recovery room and then to the ICU for continued monitoring primarily of his   hemodynamic status and his pulmonary status.          ______________________________ Lodema Pilot, MD     BL/MEDQ  D:  03/16/2011  T:  03/16/2011  Job:  [id number]

## 2011-03-16 NOTE — Transfer of Care (Signed)
Immediate Anesthesia Transfer of Care Note  Patient: Steven [name]  Procedure(s) Performed:  LAPAROSCOPY DIAGNOSTIC - repair incisional hernia  Patient Location: PACU  Anesthesia Type: General  Level of Consciousness: awake, oriented, sedated and patient cooperative  Airway & Oxygen Therapy: Patient Spontanous Breathing and Patient connected to face mask oxygen  Post-op Assessment: Report given to PACU RN, Post -op Vital signs reviewed and stable and Patient moving all extremities  Post vital signs: Reviewed and stable  Complications: respiratory complications

## 2011-03-16 NOTE — Anesthesia Preprocedure Evaluation (Addendum)
Anesthesia Evaluation  Patient identified by MRN, date of birth, ID band Patient awake    Reviewed: Allergy & Precautions, H&P , NPO status , Patient's Chart, lab work & pertinent test results  Airway Mallampati: II TM Distance: >3 FB Neck ROM: Full    Dental  (+) Teeth Intact, Dental Advisory Given and Caps,    Pulmonary neg pulmonary ROS, shortness of breath and at rest,  Low O2 sats this hospitalization.  Chest CT shows large pleural effusion and collapse of right middle and lower lobe.   + decreased breath sounds      Cardiovascular hypertension, Pt. on medications + CAD and neg cardio ROS + dysrhythmias Atrial Fibrillation Regular Normal Non-obstructive CAD. Stable.  Rapid A fib on cardizem. Controlled.   Neuro/Psych Negative Neurological ROS  Negative Psych ROS   GI/Hepatic negative GI ROS, Neg liver ROS, GERD-  Medicated and Controlled,  Endo/Other  Negative Endocrine ROS  Renal/GU negative Renal ROS  Genitourinary negative   Musculoskeletal negative musculoskeletal ROS (+)   Abdominal   Peds negative pediatric ROS (+)  Hematology negative hematology ROS (+)   Anesthesia Other Findings   Reproductive/Obstetrics negative OB ROS                        Anesthesia Physical Anesthesia Plan  ASA: III and Emergent  Anesthesia Plan: General   Post-op Pain Management:    Induction: Rapid sequence and Cricoid pressure planned  Airway Management Planned: Oral ETT  Additional Equipment:   Intra-op Plan:   Post-operative Plan: Extubation in OR  Informed Consent: I have reviewed the patients History and Physical, chart, labs and discussed the procedure including the risks, benefits and alternatives for the proposed anesthesia with the patient or authorized representative who has indicated his/her understanding and acceptance.   Dental advisory given  Plan Discussed with: CRNA and  Surgeon  Anesthesia Plan Comments: (Plan ICU post op due to very compromised right lung preop with effusion and possibility of small aspiration when patient was awake preop as NG was being placed.)       Anesthesia Quick Evaluation

## 2011-03-16 NOTE — Progress Notes (Signed)
2 Days Post-Op  Subjective: Went into atrial fibrillation with rapid ventricular rate yesterday. Now converted back to normal sinus rhythm Cardizem drip. This is being supervised by internal medicine and cardiology.  He had a small bowel movement yesterday, but still feels bloated and doesn't have much appetite. Pain is not out of proportion to the surgery that he had. He doesn't have much energy.  His wife is also concerned about his recent right total knee replacement  WBC is 23,400. He is still hyponatremic  JP drainage is serosanguineous, no bile.  Objective: Vital signs in last 24 hours: Temp:  [97.6 F (36.4 C)-98.5 F (36.9 C)] 98.1 F (36.7 C) (12/02 0510) Pulse Rate:  [90-128] 90  (12/02 0510) Resp:  [18-21] 18  (12/02 0510) BP: (102-113)/(65-73) 113/68 mmHg (12/02 0510) SpO2:  [90 %-94 %] 94 % (12/02 0510) Last BM Date: 03/11/11  Intake/Output from previous day: 12/01 0701 - 12/02 0700 In: 2430 [I.V.:2380; IV Piggyback:50] Out: 948 [Urine:875; Drains:15] Intake/Output this shift:    General appearance: alert and communicative. Perhaps slightly confused. Does not appear toxic or in any distress. Does appear fatigued. Heart rate 90, regular. Resp: clear to auscultation bilaterally GI: abdomen is obese.. Borderline distended. Wounds looked fine. Hypoactive bowel sounds. Drainages serosanguineous. No unusual tenderness although still a little sore everywhere. Extremities: recent incision right knee  without redness cellulitis or drainage. Lots of edema. No obvious infection  Lab Results:  Results for orders placed during the hospital encounter of 03/12/11 (from the past 24 hour(s))  CBC     Status: Abnormal   Collection Time   03/16/11  5:33 AM      Component Value Range   WBC 23.4 (*) 4.0 - 10.5 (K/uL)   RBC 3.27 (*) 4.22 - 5.81 (MIL/uL)   Hemoglobin 10.1 (*) 13.0 - 17.0 (g/dL)   HCT 16.1 (*) 09.6 - 52.0 (%)   MCV 90.8  78.0 - 100.0 (fL)   MCH 30.9  26.0 - 34.0  (pg)   MCHC 34.0  30.0 - 36.0 (g/dL)   RDW 04.5  40.9 - 81.1 (%)   Platelets 428 (*) 150 - 400 (K/uL)  BASIC METABOLIC PANEL     Status: Abnormal   Collection Time   03/16/11  5:33 AM      Component Value Range   Sodium 125 (*) 135 - 145 (mEq/L)   Potassium 3.5  3.5 - 5.1 (mEq/L)   Chloride 91 (*) 96 - 112 (mEq/L)   CO2 27  19 - 32 (mEq/L)   Glucose, Bld 118 (*) 70 - 99 (mg/dL)   BUN 19  6 - 23 (mg/dL)   Creatinine, Ser 9.14  0.50 - 1.35 (mg/dL)   Calcium 8.5  8.4 - 78.2 (mg/dL)   GFR calc non Af Amer 89 (*) >90 (mL/min)   GFR calc Af Amer >90  >90 (mL/min)     Studies/Results: @RISRSLT24 @     . diltiazem  10 mg Intravenous Once  . heparin subcutaneous  5,000 Units Subcutaneous Q8H  . lisinopril  10 mg Oral Daily  . pantoprazole  40 mg Oral Q1200  . piperacillin-tazobactam (ZOSYN)  IV  3.375 g Intravenous Q8H  . polyethylene glycol  17 g Oral Daily  . senna-docusate  1 tablet Oral BID  . DISCONTD: diltiazem  10 mg Intravenous Once  . DISCONTD: hydrochlorothiazide  12.5 mg Oral Daily  . DISCONTD: lisinopril  10 mg Oral Daily  . DISCONTD: lisinopril  20 mg Oral Daily  Assessment/Plan: s/p Procedure(s): LAPAROSCOPIC CHOLECYSTECTOMY  Cholecystectomy for gangrenous cholecystitis, postop day 2. He is stable but persistent leukocytosis is of some concern. Will continue antibiotics. We'll check lipase and liver function tests. Proceed with CT scan of abdomen and pelvis now to make sure were not missing a postop complication.  Lead drain in place.  Atrial fibrillation, converted on a Cardizem drip.  Hypertension. We have restarted his lisinopril and hydrochlorothiazide.  GERD. He is on Protonix.  Chronic constipation of. On daily MiraLAX. Beginning to resume bowel function but slug.  Recent right total knee replacement, followed by Dr. Merlyn Albert. Weightbearing as tolerated. Wife states that Dr. Merlyn Albert  Will take the staples out tomorrow.  On subcutaneous heparin for  DVT prophylaxis.  Full liquid diet.  Hyponatremia. I have reduced his IV fluids TKA. Check labs tomorrow   LOS: 4 days    Wilsie Kern M 03/16/2011  . .prob

## 2011-03-16 NOTE — Progress Notes (Addendum)
Subjective: Patient complains of feeling bloated  Physical Exam: Blood pressure 113/68, pulse 90, temperature 98.1 F (36.7 C), temperature source Oral, resp. rate 18, height 5\' 10"  (1.778 m), weight 128.595 kg (283 lb 8 oz), SpO2 94.00%.   GEN: NAD WG:NFAOZ with fast rate ABD: Hypoactive bowel sounds EXT: no edema cast RLE    Basic Metabolic Panel:  Basename 03/16/11 0533 03/15/11 0505  NA 125* 126*  K 3.5 4.1  CL 91* 94*  CO2 27 26  GLUCOSE 118* 119*  BUN 19 24*  CREATININE 0.80 0.80  CALCIUM 8.5 8.4  MG -- --  PHOS -- --   Liver Function Tests:  [facility] 03/16/11 1010 03/15/11 0505  AST 38* 39*  ALT 21 21  ALKPHOS 97 110  BILITOT 0.8 0.7  PROT 5.5* 5.5*  ALBUMIN 1.9* 1.9*     CBC:  Basename 03/16/11 0533 03/15/11 0505  WBC 23.4* 25.2*  NEUTROABS -- --  HGB 10.1* 9.3*  HCT 29.7* 27.9*  MCV 90.8 91.2  PLT 428* 348    No results found.    Medications: I have reviewed the patient's current medications.  Impression:  1. Acute cholecystitis. S/p cholecystectomy.  Continue IV abx per surg rec.  CT of the abdomen ordered per surgery.  2. Hyponatremia. Will check orthostatic vitals the patient could be dehydrated since his by mouth intake is decreased. If he is not orthostatic then will check urine osmolarity and uriner sodium again. Noted IV fluids KVO.  3. Hypertension. D/c HCTZ  on Lisinopril and Cardiazem  4. Status post recent right total knee replacement revision. As per ortho  5. Coronary artery disease, stable.  Stable  6. Leukocytosis Improving, secondary to number 1.  Continue IV ABX.   7. Afib/RVR Started on Cardizem drip, 2-D echo and CT angiography chest chest pending.    LOS: 4 days   Earlene Plater MD, Ladell Pier 03/16/2011, 11:02 AM   CT scan reviewed.  Patient feels SOB and now cough productive of thick yellow white mucus.  Give Lasix 20 myg IV  Add Vanc to cover HCAP Get sputum culture.  Consulted Pulmonary medicine. Surgery  notified per Radiology.

## 2011-03-16 NOTE — Anesthesia Postprocedure Evaluation (Signed)
  Anesthesia Post-op Note  Patient: Steven [name]  Procedure(s) Performed:  LAPAROSCOPY DIAGNOSTIC - repair incisional hernia  Patient Location: PACU  Anesthesia Type: General  Level of Consciousness: awake, oriented and sedated  Airway and Oxygen Therapy: Patient Spontanous Breathing and Patient connected to face mask oxygen  Post-op Pain: none  Post-op Assessment: Post-op Vital signs reviewed, Patient's Cardiovascular Status Stable, Respiratory Function Stable, Patent Airway and No signs of Nausea or vomiting  Post-op Vital Signs: Reviewed and stable  Complications: respiratory complications   (possible aspiration in holding area while vomiting)  cxr  Called for in pacu.  No distress, vss, lungs clear, 96% o2 sat consistent with holding room preop.

## 2011-03-17 ENCOUNTER — Encounter (HOSPITAL_COMMUNITY): Payer: Self-pay | Admitting: General Surgery

## 2011-03-17 ENCOUNTER — Inpatient Hospital Stay (HOSPITAL_COMMUNITY): Payer: Medicare Other

## 2011-03-17 DIAGNOSIS — J90 Pleural effusion, not elsewhere classified: Secondary | ICD-10-CM

## 2011-03-17 DIAGNOSIS — I4891 Unspecified atrial fibrillation: Secondary | ICD-10-CM

## 2011-03-17 DIAGNOSIS — I369 Nonrheumatic tricuspid valve disorder, unspecified: Secondary | ICD-10-CM

## 2011-03-17 DIAGNOSIS — J9600 Acute respiratory failure, unspecified whether with hypoxia or hypercapnia: Secondary | ICD-10-CM

## 2011-03-17 DIAGNOSIS — I4819 Other persistent atrial fibrillation: Secondary | ICD-10-CM | POA: Diagnosis not present

## 2011-03-17 LAB — CBC
MCHC: 34 g/dL (ref 30.0–36.0)
Platelets: 415 10*3/uL — ABNORMAL HIGH (ref 150–400)
RDW: 14.3 % (ref 11.5–15.5)
WBC: 18 10*3/uL — ABNORMAL HIGH (ref 4.0–10.5)

## 2011-03-17 LAB — COMPREHENSIVE METABOLIC PANEL
ALT: 19 U/L (ref 0–53)
Alkaline Phosphatase: 81 U/L (ref 39–117)
BUN: 21 mg/dL (ref 6–23)
Chloride: 97 mEq/L (ref 96–112)
GFR calc Af Amer: >90 mL/min (ref >90)
Glucose, Bld: 99 mg/dL (ref 70–99)
Potassium: 3.5 mEq/L (ref 3.5–5.1)
Sodium: 129 mEq/L — ABNORMAL LOW (ref 135–145)
Total Bilirubin: 0.6 mg/dL (ref 0.3–1.2)

## 2011-03-17 LAB — MAGNESIUM: Magnesium: 2.1 mg/dL (ref 1.5–2.5)

## 2011-03-17 MED ORDER — PANTOPRAZOLE SODIUM 40 MG IV SOLR
40.0000 mg | INTRAVENOUS | Status: DC
  Administered 2011-03-17 – 2011-03-20 (×4): 40 mg via INTRAVENOUS
  Filled 2011-03-17 (×5): qty 40

## 2011-03-17 MED ORDER — CLINDAMYCIN PHOSPHATE 300 MG/50ML IV SOLN
300.0000 mg | Freq: Four times a day (QID) | INTRAVENOUS | Status: DC
  Administered 2011-03-17 – 2011-03-20 (×14): 300 mg via INTRAVENOUS
  Filled 2011-03-17 (×16): qty 50

## 2011-03-17 MED ORDER — POTASSIUM CHLORIDE 10 MEQ/100ML IV SOLN
10.0000 meq | INTRAVENOUS | Status: AC
  Administered 2011-03-17 (×2): 10 meq via INTRAVENOUS
  Filled 2011-03-17 (×2): qty 100

## 2011-03-17 MED ORDER — POLYETHYLENE GLYCOL 3350 17 G PO PACK
17.0000 g | PACK | Freq: Every day | ORAL | Status: DC
  Administered 2011-03-18: 17 g via ORAL
  Filled 2011-03-17 (×4): qty 1

## 2011-03-17 MED ORDER — PHENOL 1.4 % MT LIQD
1.0000 | OROMUCOSAL | Status: DC | PRN
  Filled 2011-03-17: qty 177

## 2011-03-17 NOTE — Progress Notes (Signed)
*  PRELIMINARY RESULTS* Echocardiogram 2D Echocardiogram has been performed.  Cathie Beams Deneen 03/17/2011, 10:59 AM

## 2011-03-17 NOTE — Progress Notes (Signed)
Subjective:  Patient went into atrial fibrillation on 03/14/11 following TKR. Developed SBO requiring Abdominal surgery. Still in afib with controlled rate on IV diltiazem. Denies any SOB or chest pain. Complains of throat pain where NG tube placed.   Objective:  Vital Signs in the last 24 hours: BP 109/67  Pulse 94  Temp(Src) 97.7 F (36.5 C) (Axillary)  Resp 22  Ht 5\' 10"  (1.778 m)  Wt 128.595 kg (283 lb 8 oz)  BMI 40.68 kg/m2  SpO2 97%  Physical Exam:  Pleasant obese male currently in no acute distress. NG tube in place.  Lungs: Clear anteriorly. Decreased BS at bases. Cardiac: Irregular rhythm, normal S1-S2, no S3, 1/6 systolic murmur at left sternal border  Abdomen mildly distended. Drain in RUQ.  Extremities: Scars from previous knee replacement on the right knee, no definite tenderness or Homans sign. No edema  Intake/Output from previous day: 12/02 0701 - 12/03 0700 In: 6090 [I.V.:5675; IV Piggyback:400] Out: 3760 [Urine:755; Emesis/NG output:150; Drains:55]  Lab Results: Basic Metabolic Panel:  [facility] 03/17/11 0312 03/16/11 0533  NA 129* 125*  K 3.5 3.5  CL 97 91*  CO2 25 27  GLUCOSE 99 118*  BUN 21 19  CREATININE 0.72 0.80    CBC:  Basename 03/17/11 0312 03/16/11 0533  WBC 18.0* 23.4*  NEUTROABS -- --  HGB 9.6* 10.1*  HCT 28.2* 29.7*  MCV 89.0 90.8  PLT 415* 428*   TSH is normal.  Telemetry: Atrial fibrillation with controlled response now.  Radiology: CT of chest shows no pulmonary embolus. Moderate right pleural effusion with atx of RML and RLL. CXR today shows no change in Right sided airspace disease and effusion.  Assessment/Plan:  1. Postoperative atrial fibrillation with continuance. No evidence of pulmonary embolus. TSH is normal. Rate controlled on IV diltiazem. Will continue since he is NPO. 2. Coronary artery disease stable 3. Hyponatremia improved.  Recommendations:  Continue IV diltiazem. Check Echo today. Continue SQ heparin  for DVT prophylaxis. Hold ACEi due to low BP.  Joyce Heitman [place] MD  03/17/2011, 7:14 AM

## 2011-03-17 NOTE — Consult Note (Signed)
Reason for Consult: Pleural effusion Referring Physician: Yecheskel [name] is an 70 y.o. male PMHx of hypertension, hypercholesterolemia, coronary artery disease, and right knee replacement surgery 11/21 (has been on Xarelto) admitted 11/28 for ABD pain 2/2 cholecystitis [name]/P cholecystectomy 11/30. Course complicated by onset of Afib, SBO, and R pleural effusion for which PCCM consulted.   Lines/tubes: RUQ JP drain    Microbiology/Sepsis markers: Sputum 12/3>>>  Results for orders placed in visit on 02/27/11  SURGICAL PCR SCREEN     Status: Normal   Collection Time   02/27/11 10:40 AM      Component Value Range Status Comment   MRSA, PCR NEGATIVE  NEGATIVE  Final    Staphylococcus aureus NEGATIVE  NEGATIVE  Final     Anti-infectives:  Anti-infectives     Start     Dose/Rate Route Frequency Ordered Stop   03/17/11 0300   vancomycin (VANCOCIN) 1,250 mg in sodium chloride 0.9 % 250 mL IVPB        1,250 mg 166.7 mL/hr over 90 Minutes Intravenous Every 12 hours 03/16/11 1426     03/16/11 1500   vancomycin (VANCOCIN) 2,000 mg in sodium chloride 0.9 % 500 mL IVPB        2,000 mg 250 mL/hr over 120 Minutes Intravenous  Once 03/16/11 1426 03/16/11 2015   03/12/11 0900  piperacillin-tazobactam (ZOSYN) IVPB 3.375 g       3.375 g 12.5 mL/hr over 240 Minutes Intravenous Every 8 hours 03/12/11 0741     03/12/11 0545   Ampicillin-Sulbactam (UNASYN) 3 g in sodium chloride 0.9 % 100 mL IVPB        3 g 100 mL/hr over 60 Minutes Intravenous  Once 03/12/11 0541 03/12/11 4098           Best Practice/Protocols:  VTE Prophylaxis: Heparin (SQ) Protonix  Consults: Treatment Team:  [facility] Steven Bunting, MD  Steven [name], Cardiology   Studies:  03/16/2011 CT ANGIOGRAPHY CHEST CT ABDOMEN AND PELVIS WITH CONTRAST  : Dilated loops of small bowel and stomach are suggestive for a small bowel obstruction.  The small bowel obstruction is secondary to a ventral hernia from the recent  abdominal incision.  There is fluid around the liver and around small bowel loops in the abdomen.  Postoperative changes in the right upper quadrant from the cholecystectomy.   Ct Abdomen Pelvis W Contrast   Ct Abdomen Pelvis W Contrast  CT ABDOMEN AND PELVIS WITH CONTRAST    1.  Acute cholecystitis noted, with diffuse soft tissue inflammation about the mildly distended gallbladder, and trace associated pericholecystic fluid.  Trace fluid tracks about the liver.  Soft tissue inflammation involves the proximal duodenum and pancreatic head, though the inflammatory process appears centered at the gallbladder. 2.  Tiny calcifications at the body of the pancreas could reflect sequelae of remote pancreatitis; pancreas otherwise unremarkable in appearance. 3.  Nonspecific bilateral perinephric stranding and trace fluid, more prominent at the left kidney; small left renal cyst noted. 4.  Scattered calcification along the abdominal aorta and its branches, preclude prominent about the origin of the left kidney. 5.  Minimal diverticulosis along the distal descending colon, and diverticulosis along the proximal sigmoid colon, without evidence of diverticulitis. 6.  Trace free fluid within the pelvis, likely reflecting the inflammatory process at the right upper quadrant. 7.  Bibasilar atelectasis noted. 8.  Mild cardiomegaly.   Portable Chest X-ray 1 View   PORTABLE CHEST - 1 VIEW:  1. No  significant change since one day prior. 2.  Right-sided airspace disease and pleural fluid are not significantly changed.  Favor infection or atelectasis.  Asymmetric pulmonary edema felt less likely. 3. Decreased sensitivity and specificity exam due to technique related factors, as described above.      Events:  11/30 lap chole 12/1 SBO 12/1 went into Afib>>converted /c cardizem 12/2 Taken back to OR for trocar site hernia at umbilicus     Medications: I have reviewed the patient'[name] current medications.  LABS Results  for orders placed during the hospital encounter of 03/12/11 (from the past 24 hour([name]))  HEPATIC FUNCTION PANEL     Status: Abnormal   Collection Time   03/16/11 10:10 AM      Component Value Range   Total Protein 5.5 (*) 6.0 - 8.3 (g/dL)   Albumin 1.9 (*) 3.5 - 5.2 (g/dL)   AST 38 (*) 0 - 37 (U/L)   ALT 21  0 - 53 (U/L)   Alkaline Phosphatase 97  39 - 117 (U/L)   Total Bilirubin 0.8  0.3 - 1.2 (mg/dL)   Bilirubin, Direct 0.4 (*) 0.0 - 0.3 (mg/dL)   Indirect Bilirubin 0.4  0.3 - 0.9 (mg/dL)  LIPASE, BLOOD     Status: Normal   Collection Time   03/16/11 10:10 AM      Component Value Range   Lipase 15  11 - 59 (U/L)  TSH     Status: Normal   Collection Time   03/16/11 10:10 AM      Component Value Range   TSH 0.384  0.350 - 4.500 (uIU/mL)  CBC     Status: Abnormal   Collection Time   03/17/11  3:12 AM      Component Value Range   WBC 18.0 (*) 4.0 - 10.5 (K/uL)   RBC 3.17 (*) 4.22 - 5.81 (MIL/uL)   Hemoglobin 9.6 (*) 13.0 - 17.0 (g/dL)   HCT 16.1 (*) 09.6 - 52.0 (%)   MCV 89.0  78.0 - 100.0 (fL)   MCH 30.3  26.0 - 34.0 (pg)   MCHC 34.0  30.0 - 36.0 (g/dL)   RDW 04.5  40.9 - 81.1 (%)   Platelets 415 (*) 150 - 400 (K/uL)  COMPREHENSIVE METABOLIC PANEL     Status: Abnormal   Collection Time   03/17/11  3:12 AM      Component Value Range   Sodium 129 (*) 135 - 145 (mEq/L)   Potassium 3.5  3.5 - 5.1 (mEq/L)   Chloride 97  96 - 112 (mEq/L)   CO2 25  19 - 32 (mEq/L)   Glucose, Bld 99  70 - 99 (mg/dL)   BUN 21  6 - 23 (mg/dL)   Creatinine, Ser 9.14  0.50 - 1.35 (mg/dL)   Calcium 7.7 (*) 8.4 - 10.5 (mg/dL)   Total Protein 4.7 (*) 6.0 - 8.3 (g/dL)   Albumin 1.6 (*) 3.5 - 5.2 (g/dL)   AST 35  0 - 37 (U/L)   ALT 19  0 - 53 (U/L)   Alkaline Phosphatase 81  39 - 117 (U/L)   Total Bilirubin 0.6  0.3 - 1.2 (mg/dL)   GFR calc non Af Amer >90  >90 (mL/min)   GFR calc Af Amer >90  >90 (mL/min)  MAGNESIUM     Status: Normal   Collection Time   03/17/11  3:12 AM      Component Value Range    Magnesium 2.1  1.5 - 2.5 (mg/dL)  PHOSPHORUS     Status: Normal   Collection Time   03/17/11  3:12 AM      Component Value Range   Phosphorus 2.7  2.3 - 4.6 (mg/dL)     Blood pressure 161/09, pulse 102, temperature 98.1 F (36.7 C), temperature source Axillary, resp. rate 22, height 5\' 10"  (1.778 m), weight 283 lb 8 oz (128.595 kg), SpO2 97.00%.  Gen. Pleasant, obese, in no distress, normal affect ENT - no lesions, no post nasal drip, hoarse Neck: No JVD, no thyromegaly, no carotid bruits Lungs: no use of accessory muscles, no dullness to percussion, decreasedwithout rales or rhonchi  Cardiovascular: Rhythm regular, heart sounds  normal, no murmurs, no peripheral edema Abdomen: soft and diffuse-tender, distended, no hepatosplenomegaly, BS absent Musculoskeletal: No deformities, no cyanosis or clubbing Neuro:  alert, non focal Skin:  Warm, no lesions/ rash    Assessment and Plan   R pleural effusion vs atelectasis 2/2 volume overload (+8L), decreased respiratory function, immobility, surgical course complications.  Recommend aggressive pulmonary hygiene - IS, Lasix, and decrease IV fluids understanding that pt it hyponatremic and on Cardizem complicating diuresis, AM CXR and re-evaluate.  chlorseptic spray  Deconditioning - PT consult Suture removal by dr Lequita Halt  GI - change protonix to IV, per sx team  Steven [name],Steven [name],  03/17/2011, 9:12 AM    ALVA,RAKESH V.

## 2011-03-17 NOTE — Progress Notes (Signed)
Patient ID: Steven [name], male   DOB: 1940/11/29, 70 y.o.   MRN: [id number] 1 Day Post-Op  Subjective: Taken back to OR for trocar site hernia at umbilicus.  He has had a small BM, but is still bloated.    Objective: Vital signs in last 24 hours: Temp:  [97 F (36.1 C)-98.4 F (36.9 C)] 98.1 F (36.7 C) (12/03 0800) Pulse Rate:  [90-102] 102  (12/03 0820) Resp:  [17-28] 22  (12/03 0820) BP: (94-117)/(51-81) 104/69 mmHg (12/03 0820) SpO2:  [93 %-99 %] 97 % (12/03 0820) Last BM Date: 03/15/11  Intake/Output from previous day: 12/02 0701 - 12/03 0700 In: 6090 [I.V.:5675; IV Piggyback:400] Out: 3760 [Urine:755; Emesis/NG output:150; Drains:55] Intake/Output this shift: Total I/O In: 130 [I.V.:130] Out: -   General:  Sleeping, but arousable.   CV regular Abd- soft, appropriately tender, distended.  Dressing c/d/i. JP drainage is serosanguineous, no bile.  Lab Results:  Results for orders placed during the hospital encounter of 03/12/11 (from the past 24 hour(s))  CBC     Status: Abnormal   Collection Time   03/17/11  3:12 AM      Component Value Range   WBC 18.0 (*) 4.0 - 10.5 (K/uL)   RBC 3.17 (*) 4.22 - 5.81 (MIL/uL)   Hemoglobin 9.6 (*) 13.0 - 17.0 (g/dL)   HCT 78.4 (*) 69.6 - 52.0 (%)   MCV 89.0  78.0 - 100.0 (fL)   MCH 30.3  26.0 - 34.0 (pg)   MCHC 34.0  30.0 - 36.0 (g/dL)   RDW 29.5  28.4 - 13.2 (%)   Platelets 415 (*) 150 - 400 (K/uL)  COMPREHENSIVE METABOLIC PANEL     Status: Abnormal   Collection Time   03/17/11  3:12 AM      Component Value Range   Sodium 129 (*) 135 - 145 (mEq/L)   Potassium 3.5  3.5 - 5.1 (mEq/L)   Chloride 97  96 - 112 (mEq/L)   CO2 25  19 - 32 (mEq/L)   Glucose, Bld 99  70 - 99 (mg/dL)   BUN 21  6 - 23 (mg/dL)   Creatinine, Ser 4.40  0.50 - 1.35 (mg/dL)   Calcium 7.7 (*) 8.4 - 10.5 (mg/dL)   Total Protein 4.7 (*) 6.0 - 8.3 (g/dL)   Albumin 1.6 (*) 3.5 - 5.2 (g/dL)   AST 35  0 - 37 (U/L)   ALT 19  0 - 53 (U/L)   Alkaline Phosphatase  81  39 - 117 (U/L)   Total Bilirubin 0.6  0.3 - 1.2 (mg/dL)   GFR calc non Af Amer >90  >90 (mL/min)   GFR calc Af Amer >90  >90 (mL/min)  MAGNESIUM     Status: Normal   Collection Time   03/17/11  3:12 AM      Component Value Range   Magnesium 2.1  1.5 - 2.5 (mg/dL)  PHOSPHORUS     Status: Normal   Collection Time   03/17/11  3:12 AM      Component Value Range   Phosphorus 2.7  2.3 - 4.6 (mg/dL)     Studies/Results:  Ct Angio Chest W/cm &/or Wo Cm  03/16/2011  *RADIOLOGY REPORT*  Clinical Data:  70 year old post cholecystectomy and rule out PE.  CT ANGIOGRAPHY CHEST CT ABDOMEN AND PELVIS WITH CONTRAST  TIMPRESSION: No evidence for a pulmonary embolism.  Development of a moderate-sized right pleural effusion with collapse of the right lower lobe and right middle lobe.  CT ABDOMEN AND PELVIS   IMPRESSION: Dilated loops of small bowel and stomach are suggestive for a small bowel obstruction.  The small bowel obstruction is secondary to a ventral hernia from the recent abdominal incision.  There is fluid around the liver and around small bowel loops in the abdomen.  Postoperative changes in the right upper quadrant from the cholecystectomy.  These results were called by telephone on 03/16/2011  at  1:44 p.m. to  Dr. Biagio Quint, who verbally acknowledged these results.  Original Report Authenticated By: Richarda Overlie, M.D.   Ct Abdome Portable Chest X-ray 1 View  03/17/2011  *RADIOLOGY REPORT*  Clinical Data: Follow up effusion.  PORTABLE CHEST - 1 VIEW  Comparison: 1 day prior  Findings: Mildly degraded exam due to AP portable technique and patient body habitus.  Nasogastric extends beyond the  inferior aspect of the film. Minimal motion degradation.  Moderate cardiomegaly.  Lung bases minimally excluded.  Layering right-sided pleural effusion is similar. No pneumothorax.  Diffuse right-sided airspace disease is not changed.  IMPRESSION:  1. No significant change since one day prior. 2.  Right-sided  airspace disease and pleural fluid are not significantly changed.  Favor infection or atelectasis.  Asymmetric pulmonary edema felt less likely. 3. Decreased sensitivity and specificity exam due to technique related factors, as described above.  Original Report Authenticated By: Consuello Bossier, M.D.      . heparin subcutaneous  5,000 Units Subcutaneous Q8H  . pantoprazole  40 mg Oral Q1200  . piperacillin-tazobactam (ZOSYN)  IV  3.375 g Intravenous Q8H  . polyethylene glycol  17 g Oral Daily  . potassium chloride  40 mEq Oral Once  . vancomycin  1,250 mg Intravenous Q12H  . vancomycin  2,000 mg Intravenous Once  . DISCONTD: enoxaparin  40 mg Subcutaneous Q24H  . DISCONTD: furosemide  20 mg Intravenous Once  . DISCONTD: lisinopril  10 mg Oral Daily  . DISCONTD: pantoprazole  40 mg Oral Q1200  . DISCONTD: polyethylene glycol  17 g Oral Daily  . DISCONTD: senna-docusate  1 tablet Oral BID     Assessment/Plan: s/p Procedure(s): LAPAROSCOPIC CHOLECYSTECTOMY Continue antibiotics for gangrenous cholecystitis. Leave drain in place.  Atrial fibrillation, converted on a Cardizem drip.  Hypertension. lisinopril and hydrochlorothiazide.  GERD. He is on Protonix.  Leave NGT.  Hold miralax until better bowel function following hernia repair.    Recent right total knee replacement, followed by Dr. Merlyn Albert. Weightbearing as tolerated. W On subcutaneous heparin for DVT prophylaxis.  NPO   LOS: 5 days    [facility] 03/17/2011

## 2011-03-17 NOTE — Progress Notes (Signed)
Subjective: Patient feels a lot better today.  Physical Exam: Blood pressure 104/69, pulse 102, temperature 98 F (36.7 C), temperature source Axillary, resp. rate 22, height 5\' 10"  (1.778 m), weight 128.595 kg (283 lb 8 oz), SpO2 97.00%.   GEN: NAD [id number] with fast rate ABD: Hypoactive bowel sounds, mildly distended EXT: no edema cast RLE    Basic Metabolic Panel:  Basename 03/17/11 0312 03/16/11 0533  NA 129* 125*  K 3.5 3.5  CL 97 91*  CO2 25 27  GLUCOSE 99 118*  BUN 21 19  CREATININE 0.72 0.80  CALCIUM 7.7* 8.5  MG 2.1 --  PHOS 2.7 --   Liver Function Tests:  [facility] 03/17/11 0312 03/16/11 1010  AST 35 38*  ALT 19 21  ALKPHOS 81 97  BILITOT 0.6 0.8  PROT 4.7* 5.5*  ALBUMIN 1.6* 1.9*     CBC:  Basename 03/17/11 0312 03/16/11 0533  WBC 18.0* 23.4*  NEUTROABS -- --  HGB 9.6* 10.1*  HCT 28.2* 29.7*  MCV 89.0 90.8  PLT 415* 428*    Ct Angio Chest W/cm &/or Wo Cm  03/16/2011  *RADIOLOGY REPORT*  Clinical Data:  70 year old post cholecystectomy and rule out PE.  CT ANGIOGRAPHY CHEST CT ABDOMEN AND PELVIS WITH CONTRAST  Technique:  Multidetector CT imaging of the chest was performed using the standard protocol during bolus administration of intravenous contrast.  Multiplanar CT image reconstructions including MIPs were obtained to evaluate the vascular anatomy. Multidetector CT imaging of the abdomen and pelvis was performed using the standard protocol during bolus administration of intravenous contrast.  Contrast: OMNIPAQUE IOHEXOL 300 MG/ML IV SOLN  Comparison:  Abdominal CT 03/04/2011  CTA CHEST  Findings:  There has been development of a moderate-sized right pleural effusion. No evidence for pulmonary embolism.  Ascending thoracic aorta measures up to 4.2 cm.  Evidence for coronary artery calcifications.  There is marked volume loss in the right lower lobe and right middle lobe.  Difficult to exclude subcarinal lymphadenopathy due to the right pleural  fluid.  There is volume loss in the left lower lobe.  Minimal aeration in the right middle lobe and right lower lobe.  No acute bony abnormality.   Review of the MIP images confirms the above findings.  IMPRESSION: No evidence for a pulmonary embolism.  Development of a moderate-sized right pleural effusion with collapse of the right lower lobe and right middle lobe.  CT ABDOMEN AND PELVIS  Findings: Patient has undergone a cholecystectomy.  There is a surgical drain in the right upper quadrant.  Small amount of gas and fluid in the cholecystectomy bed.  There is fluid around the liver and some of it may be subcapsular.  Inflammation and fluid in the right upper quadrant.  The stomach and small bowel loops are markedly distended.  The distal ileum is decompressed.  Large amount of free fluid around the bowel loops in the right lower abdomen. There is a small bowel loop that herniates into the ventral abdomen and possibly related to the laparoscopic incision.  This ventral hernia appears to be the source of the small bowel obstruction and transition point.  No significant free fluid in the pelvis.  There is oral contrast in the colon and probably from the study on 03/12/2011  No gross abnormality to the spleen, pancreas, adrenal glands and kidneys.  No acute bony abnormalities.  Review of the MIP images confirms the above findings.  IMPRESSION: Dilated loops of small bowel and stomach are  suggestive for a small bowel obstruction.  The small bowel obstruction is secondary to a ventral hernia from the recent abdominal incision.  There is fluid around the liver and around small bowel loops in the abdomen.  Postoperative changes in the right upper quadrant from the cholecystectomy.  These results were called by telephone on 03/16/2011  at  1:44 p.m. to  Dr. Biagio Quint, who verbally acknowledged these results.  Original Report Authenticated By: Richarda Overlie, M.D.   Ct Abdomen Pelvis W Contrast  03/16/2011  *RADIOLOGY REPORT*   Clinical Data:  70 year old post cholecystectomy and rule out PE.  CT ANGIOGRAPHY CHEST CT ABDOMEN AND PELVIS WITH CONTRAST  Technique:  Multidetector CT imaging of the chest was performed using the standard protocol during bolus administration of intravenous contrast.  Multiplanar CT image reconstructions including MIPs were obtained to evaluate the vascular anatomy. Multidetector CT imaging of the abdomen and pelvis was performed using the standard protocol during bolus administration of intravenous contrast.  Contrast: OMNIPAQUE IOHEXOL 300 MG/ML IV SOLN  Comparison:  Abdominal CT 03/04/2011  CTA CHEST  Findings:  There has been development of a moderate-sized right pleural effusion. No evidence for pulmonary embolism.  Ascending thoracic aorta measures up to 4.2 cm.  Evidence for coronary artery calcifications.  There is marked volume loss in the right lower lobe and right middle lobe.  Difficult to exclude subcarinal lymphadenopathy due to the right pleural fluid.  There is volume loss in the left lower lobe.  Minimal aeration in the right middle lobe and right lower lobe.  No acute bony abnormality.   Review of the MIP images confirms the above findings.  IMPRESSION: No evidence for a pulmonary embolism.  Development of a moderate-sized right pleural effusion with collapse of the right lower lobe and right middle lobe.  CT ABDOMEN AND PELVIS  Findings: Patient has undergone a cholecystectomy.  There is a surgical drain in the right upper quadrant.  Small amount of gas and fluid in the cholecystectomy bed.  There is fluid around the liver and some of it may be subcapsular.  Inflammation and fluid in the right upper quadrant.  The stomach and small bowel loops are markedly distended.  The distal ileum is decompressed.  Large amount of free fluid around the bowel loops in the right lower abdomen. There is a small bowel loop that herniates into the ventral abdomen and possibly related to the laparoscopic  incision.  This ventral hernia appears to be the source of the small bowel obstruction and transition point.  No significant free fluid in the pelvis.  There is oral contrast in the colon and probably from the study on 03/12/2011  No gross abnormality to the spleen, pancreas, adrenal glands and kidneys.  No acute bony abnormalities.  Review of the MIP images confirms the above findings.  IMPRESSION: Dilated loops of small bowel and stomach are suggestive for a small bowel obstruction.  The small bowel obstruction is secondary to a ventral hernia from the recent abdominal incision.  There is fluid around the liver and around small bowel loops in the abdomen.  Postoperative changes in the right upper quadrant from the cholecystectomy.  These results were called by telephone on 03/16/2011  at  1:44 p.m. to  Dr. Biagio Quint, who verbally acknowledged these results.  Original Report Authenticated By: Richarda Overlie, M.D.   Portable Chest X-ray 1 View  03/17/2011  *RADIOLOGY REPORT*  Clinical Data: Follow up effusion.  PORTABLE CHEST - 1 VIEW  Comparison: 1 day prior  Findings: Mildly degraded exam due to AP portable technique and patient body habitus.  Nasogastric extends beyond the  inferior aspect of the film. Minimal motion degradation.  Moderate cardiomegaly.  Lung bases minimally excluded.  Layering right-sided pleural effusion is similar. No pneumothorax.  Diffuse right-sided airspace disease is not changed.  IMPRESSION:  1. No significant change since one day prior. 2.  Right-sided airspace disease and pleural fluid are not significantly changed.  Favor infection or atelectasis.  Asymmetric pulmonary edema felt less likely. 3. Decreased sensitivity and specificity exam due to technique related factors, as described above.  Original Report Authenticated By: Consuello Bossier, M.D.   Dg Chest Port 1 View  03/16/2011  *RADIOLOGY REPORT*  Clinical Data: Postop ventral hernia repair.  Question aspiration.  PORTABLE CHEST - 1  VIEW  Comparison: 03/14/2011  Findings: Increasing airspace disease, right greater than left. Very low lung volumes.  Cardiomegaly.  Airspace disease could represent asymmetric edema.  Aspiration cannot be completely excluded.  Possible small right effusion.  IMPRESSION: Bilateral airspace disease, right greater than left, asymmetric edema versus infection or aspiration.  Cardiomegaly, low lung volumes.  Small right effusion.  Original Report Authenticated By: Cyndie Chime, M.D.      Medications: I have reviewed the patient's current medications.  Impression:  1. Acute cholecystitis. S/p cholecystectomy.  Continue IV abx per surg rec.  patient had developed a hernia with small bowel obstruction. He was taken back to surgery. He is doing a good post surgery.  2. Hyponatremia. His hyponatremia is improving. He is volume overloaded. Decreased his IV fluids. Recheck sodium levels tomorrow.   3. Hypertension. D/c HCTZ  on Lisinopril and Cardiazem  4. Status post recent right total knee replacement revision. As per ortho  5. Coronary artery disease, stable.  Stable  6. Leukocytosis Improving, secondary to number 1.  Continue IV ABX.   7. Afib/RVR Started on Cardizem drip, 2-D echo done today. Cardiology is following.    8. patient feels much better today. Will transfer him to step down from ICU.   LOS: 5 days   Earlene Plater MD, [place] 03/17/2011, 4:09 PM

## 2011-03-17 NOTE — Progress Notes (Addendum)
Physical Therapy Evaluation Patient Details Name: Steven [name] MRN: [id number] DOB: January 17, 1941 Today's Date: 03/17/2011 [id number] eval 2 Problem List:  Patient Active Problem List  Diagnoses  . CORONARY ATHEROSCLEROSIS NATIVE CORONARY ARTERY  . OTHER DYSPNEA AND RESPIRATORY ABNORMALITIES  . ABNORMAL ELECTROCARDIOGRAM  . HTN (hypertension)  . Hypercholesteremia  . Chronic knee pain  . Hyponatremia  . Acute cholecystitis  . Anemia  . Atrial fibrillation    Past Medical History:  Past Medical History  Diagnosis Date  . Obesity   . Dyslipidemia   . Coronary artery disease 02-27-11    Dr. Marliss Coots follows-has some blocked coronary arteries ,not suitable for stent  placement  . Hypertension   . Shingles outbreak 02-27-11    2 weeks ago , was tx.-only residual is tenderness of right scalp-no open areas  . GERD (gastroesophageal reflux disease) 02-27-11    Acid reflux  . Hemorrhoids 02-27-11    not bothersome at this time  . Hearing loss 02-27-11    Bilateral hearing aids-due to exposure to loud machinery  . Urinary incontinence 02-27-11    not an everday occurrence-no special measures  . Osteoarthritis 02-27-11    Ostearthritis-knees, shoulders.Back causes chronic pain-radiates down right leg  . Chronic pain    Past Surgical History:  Past Surgical History  Procedure Date  . Replacement total knee 02-27-11    right- 2007  . Total knee revision 03/05/2011    Procedure: TOTAL KNEE REVISION;  Surgeon: Gus Rankin Aluisio;  Location: [facility];  Service: Orthopedics;  Laterality: Right;  . Injection knee 03/05/2011    Procedure: KNEE INJECTION;  Surgeon: Loanne Drilling;  Location: [facility];  Service: Orthopedics;  Laterality: Left;  80 mg depomedrol  . Cholecystectomy 03/14/2011    Procedure: LAPAROSCOPIC CHOLECYSTECTOMY;  Surgeon: Mariella Saa, MD;  Location: [facility];  Service: General;  Laterality: N/A;    PT Assessment/Plan/Recommendation  PT Assessment VS  before PT:BP 122/72, HR 114, sats 98% on 3.5 liters O2 VS during : HR 130s-150 (actvity stopped by PT) pt w/o complaints; Sats 91% on RA VS after:  BP 133/81,  HR 116,  Sats 96% Clinical Impression Statement: pt well known to me from recent TKA surgery; will benefit from PT to maximize independence for home; expect pt should progress well and be able toreturn home with HHPT and family support; PT Recommendation/Assessment: Patient will need skilled PT in the acute care venue PT Problem List: Decreased strength;Decreased range of motion;Decreased activity tolerance;Decreased balance;Decreased mobility;Decreased knowledge of use of DME Barriers to Discharge: None PT Therapy Diagnosis : Difficulty walking PT Plan PT Frequency: Min 5X/week PT Treatment/Interventions: DME instruction;Gait training;Stair training;Functional mobility training;Therapeutic activities;Therapeutic exercise;Patient/family education PT Recommendation Follow Up Recommendations: Home health PT Equipment Recommended: None recommended by PT   PT Goals  Acute Rehab PT Goals Time For Goal Achievement: 2 weeks Pt will go Supine/Side to Sit: with modified independence;with HOB 0 degrees PT Goal: Supine/Side to Sit - Progress: Progressing toward goal Pt will go Sit to Supine/Side: with modified independence;with HOB 0 degrees PT Goal: Sit to Supine/Side - Progress: Other (comment) Pt will Ambulate: 51 - 150 feet;with supervision;with rolling walker PT Goal: Ambulate - Progress: Progressing toward goal Pt will Go Up / Down Stairs: 1-2 stairs;with least restrictive assistive device PT Goal: Up/Down Stairs - Progress: Other (comment) Pt will Perform Home Exercise Program: with supervision, verbal cues required/provided PT Goal: Perform Home Exercise Program - Progress: Progressing toward goal  PT Evaluation Precautions/Restrictions  Precautions Precautions: Knee Precaution Comments: no pillow under knee;  Restrictions RLE  Weight Bearing: Weight bearing as tolerated Other Position/Activity Restrictions: pt able to perform independent SLR ; placed in KI for positioning in knee extension; Prior Functioning  Home Living Lives With: Spouse Receives Help From: Family Type of Home: House Home Layout: One level Home Access: Stairs to enter Entrance Stairs-Rails: None Entrance Stairs-Number of Steps: 2 Home Adaptive Equipment: Walker - rolling Prior Function Level of Independence: Independent with gait;Independent with transfers (prior to TKA pt was independent) Driving: Yes Cognition Cognition Arousal/Alertness: Awake/alert Overall Cognitive Status: Appears within functional limits for tasks assessed Orientation Level: Oriented X4 Sensation/Coordination   Extremity Assessment RUE Assessment RUE Assessment: Within Functional Limits LUE Assessment LUE Assessment: Within Functional Limits RLE Assessment RLE Assessment:  (able to do I SLR; ankle WFL; AROM 15-40*) LLE Assessment LLE Assessment: Within Functional Limits Mobility (including Balance) Bed Mobility Supine to Sit: 1: +2 Total assist Supine to Sit Details (indicate cue type and reason): +2 for lines and safety; pt= 80% Transfers Sit to Stand: 1: +2 Total assist;From bed;From elevated surface Sit to Stand Details (indicate cue type and reason): +2 for lines and safety; pt= 80% Stand to Sit: To chair/3-in-1;1: +2 Total assist Stand to Sit Details: +2 for lines and safety; pt= 80% Ambulation/Gait Ambulation/Gait Assistance: 1: +2 Total assist Ambulation/Gait Assistance Details (indicate cue type and reason): +2 for lines and safety; pt= 80%;  cues for posture breathing, RW safety; limited by increased HR Ambulation Distance (Feet): 10 Feet Assistive device: Rolling walker    Exercise  Total Joint Exercises Ankle Circles/Pumps: AROM;5 reps;Both;Supine Quad Sets: AROM;10 reps;Both;Supine End of Session PT - End of Session Equipment Utilized  During Treatment: Gait belt;Right knee immobilizer Activity Tolerance: Patient tolerated treatment well;Treatment limited secondary to medical complications (Comment) (increased HR) Nurse Communication: Mobility status for transfers;Mobility status for ambulation General Behavior During Session: [facility] for tasks performed Cognition: [facility] for tasks performed  [facility] 03/17/2011, 4:52 PM

## 2011-03-18 ENCOUNTER — Encounter (HOSPITAL_COMMUNITY): Payer: Self-pay | Admitting: General Surgery

## 2011-03-18 ENCOUNTER — Inpatient Hospital Stay (HOSPITAL_COMMUNITY): Payer: Medicare Other

## 2011-03-18 DIAGNOSIS — J90 Pleural effusion, not elsewhere classified: Secondary | ICD-10-CM

## 2011-03-18 DIAGNOSIS — J9600 Acute respiratory failure, unspecified whether with hypoxia or hypercapnia: Secondary | ICD-10-CM

## 2011-03-18 LAB — CBC
HCT: 28.5 % — ABNORMAL LOW (ref 39.0–52.0)
Platelets: 481 10*3/uL — ABNORMAL HIGH (ref 150–400)
RBC: 3.16 MIL/uL — ABNORMAL LOW (ref 4.22–5.81)
RDW: 14.9 % (ref 11.5–15.5)
WBC: 20.4 10*3/uL — ABNORMAL HIGH (ref 4.0–10.5)

## 2011-03-18 LAB — MAGNESIUM: Magnesium: 2.2 mg/dL (ref 1.5–2.5)

## 2011-03-18 LAB — BASIC METABOLIC PANEL
CO2: 23 mEq/L (ref 19–32)
Calcium: 7.6 mg/dL — ABNORMAL LOW (ref 8.4–10.5)
Potassium: 3.3 mEq/L — ABNORMAL LOW (ref 3.5–5.1)
Sodium: 131 mEq/L — ABNORMAL LOW (ref 135–145)

## 2011-03-18 MED ORDER — FUROSEMIDE 10 MG/ML IJ SOLN
20.0000 mg | Freq: Once | INTRAMUSCULAR | Status: AC
  Administered 2011-03-18: 20 mg via INTRAVENOUS
  Filled 2011-03-18: qty 2

## 2011-03-18 MED ORDER — POTASSIUM CHLORIDE 10 MEQ/100ML IV SOLN
10.0000 meq | INTRAVENOUS | Status: AC
  Administered 2011-03-18 (×2): 10 meq via INTRAVENOUS
  Filled 2011-03-18 (×2): qty 100

## 2011-03-18 NOTE — Progress Notes (Signed)
NGT clamped; will continue to monitior.

## 2011-03-18 NOTE — Progress Notes (Signed)
Seen, agree with above. Will try to get NGT out today.

## 2011-03-18 NOTE — Progress Notes (Signed)
Subjective:  Patient went into atrial fibrillation on 03/14/11 following TKR. Developed SBO following cholecystectomy requiring repeat Abdominal surgery. Still in afib with controlled rate on IV diltiazem. Denies any SOB or chest pain. Complains of throat pain where NG tube placed. Hoarse.  Objective:  Vital Signs in the last 24 hours: BP 120/73  Pulse 120  Temp(Src) 98 F (36.7 C) (Oral)  Resp 25  Ht 5\' 10"  (1.778 m)  Wt 128.595 kg (283 lb 8 oz)  BMI 40.68 kg/m2  SpO2 93%  Physical Exam:  Pleasant obese male currently in no acute distress. NG tube in place.  Lungs: Clear anteriorly. Decreased BS at bases. Cardiac: Irregular rhythm, normal S1-S2, no S3, 1/6 systolic murmur at left sternal border  Abdomen mildly distended. Drain in RUQ.  Extremities: Scars from previous knee replacement on the right knee, no definite tenderness or Homans sign. No edema  Intake/Output from previous day: 12/03 0701 - 12/04 0700 In: 2199 [I.V.:939; IV Piggyback:800] Out: 1310 [Urine:850; Emesis/NG output:450; Drains:10]  Lab Results: Basic Metabolic Panel:  [facility] 03/18/11 0310 03/17/11 0312  NA 131* 129*  K 3.3* 3.5  CL 97 97  CO2 23 25  GLUCOSE 101* 99  BUN 19 21  CREATININE 0.80 0.72    CBC:  [facility]ename 03/18/11 0310 03/17/11 0312  WBC 20.4* 18.0*  NEUTROABS -- --  HGB 9.7* 9.6*  HCT 28.5* 28.2*  MCV 90.2 89.0  PLT 481* 415*   TSH is normal.  Telemetry: Atrial fibrillation with controlled response. Rate 90 bpm.  Echo shows mild LVH with normal systolic function EF 65-70%. Mild LAE.  Assessment/Plan:  1. Postoperative atrial fibrillation with continuance.  Rate controlled on IV diltiazem. Will continue since he is NPO. 2. Coronary artery disease stable 3. Hyponatremia improved. 4. SBO s/p choly 5. S/p TKR  Recommendations:  Continue IV diltiazem.  Continue SQ heparin for DVT prophylaxis. Will transition diltiazem to po once able to take po's   Peter [place]  MD  03/18/2011, 6:02 AM

## 2011-03-18 NOTE — Progress Notes (Signed)
Reason for Consult: Pleural effusion Referring Physician: [name] is an 70 y.o. male PMHx of hypertension, hypercholesterolemia, coronary artery disease, and right knee replacement surgery 11/21 (Steven [name],has been on Xarelto) admitted 11/28 for ABD pain 2/2 cholecystitis S/P cholecystectomy 11/30. Course complicated by onset of Afib, SBO, and R pleural effusion for which PCCM consulted.   Lines/tubes: RUQ JP drain    Microbiology/Sepsis markers: Sputum 12/3>>>  Results for orders placed in visit on 02/27/11  SURGICAL PCR SCREEN     Status: Normal   Collection Time   02/27/11 10:40 AM      Component Value Range Status Comment   MRSA, PCR NEGATIVE  NEGATIVE  Final    Staphylococcus aureus NEGATIVE  NEGATIVE  Final     Anti-infectives:  Anti-infectives     Start     Dose/Rate Route Frequency Ordered Stop   03/17/11 1200   clindamycin (CLEOCIN) IVPB 300 mg        300 mg 100 mL/hr over 30 Minutes Intravenous 4 times per day 03/17/11 1125     03/17/11 0300   vancomycin (VANCOCIN) 1,250 mg in sodium chloride 0.9 % 250 mL IVPB        1,250 mg 166.7 mL/hr over 90 Minutes Intravenous Every 12 hours 03/16/11 1426     03/16/11 1500   vancomycin (VANCOCIN) 2,000 mg in sodium chloride 0.9 % 500 mL IVPB        2,000 mg 250 mL/hr over 120 Minutes Intravenous  Once 03/16/11 1426 03/16/11 2015   03/12/11 0900   piperacillin-tazobactam (ZOSYN) IVPB 3.375 g        3.375 g 12.5 mL/hr over 240 Minutes Intravenous Every 8 hours 03/12/11 0741     03/12/11 0545   Ampicillin-Sulbactam (UNASYN) 3 g in sodium chloride 0.9 % 100 mL IVPB        3 g 100 mL/hr over 60 Minutes Intravenous  Once 03/12/11 0541 03/12/11 1191           Best Practice/Protocols:  VTE Prophylaxis: Heparin (SQ) Protonix  Consults: Treatment Team:  [facility] Steven Bunting, MD  Steven [name], Cardiology   Studies:     Ct Abdomen Pelvis W Contrast  CT ABDOMEN AND PELVIS WITH CONTRAST    1.  Acute  cholecystitis noted, with diffuse soft tissue inflammation about the mildly distended gallbladder, and trace associated pericholecystic fluid.  Trace fluid tracks about the liver.  Soft tissue inflammation involves the proximal duodenum and pancreatic head, though the inflammatory process appears centered at the gallbladder. 2.  Tiny calcifications at the body of the pancreas could reflect sequelae of remote pancreatitis; pancreas otherwise unremarkable in appearance. 3.  Nonspecific bilateral perinephric stranding and trace fluid, more prominent at the left kidney; small left renal cyst noted. 4.  Scattered calcification along the abdominal aorta and its branches, preclude prominent about the origin of the left kidney. 5.  Minimal diverticulosis along the distal descending colon, and diverticulosis along the proximal sigmoid colon, without evidence of diverticulitis. 6.  Trace free fluid within the pelvis, likely reflecting the inflammatory process at the right upper quadrant. 7.  Bibasilar atelectasis noted. 8.  Mild cardiomegaly.   Portable Chest X-ray 1 View  PORTABLE CHEST - 1 VIEW  Comparison: Single view chest 03/17/2011.1. Slight improved aeration at the left lung base.2. Persistent layering effusion and atelectasis at the right lung base.3. NG tube.     Events:  11/30 lap chole 12/1 SBO 12/1 went into Afib>>converted /c  cardizem 12/2 Taken back to OR for trocar site hernia at umbilicus  Medications: I have reviewed the patient's current medications.  LABS Results for orders placed during the hospital encounter of 03/12/11 (from the past 24 hour(s))  BASIC METABOLIC PANEL     Status: Abnormal   Collection Time   03/18/11  3:10 AM      Component Value Range   Sodium 131 (*) 135 - 145 (mEq/L)   Potassium 3.3 (*) 3.5 - 5.1 (mEq/L)   Chloride 97  96 - 112 (mEq/L)   CO2 23  19 - 32 (mEq/L)   Glucose, Bld 101 (*) 70 - 99 (mg/dL)   BUN 19  6 - 23 (mg/dL)   Creatinine, Ser 5.62  0.50 -  1.35 (mg/dL)   Calcium 7.6 (*) 8.4 - 10.5 (mg/dL)   GFR calc non Af Amer 89 (*) >90 (mL/min)   GFR calc Af Amer >90  >90 (mL/min)  MAGNESIUM     Status: Normal   Collection Time   03/18/11  3:10 AM      Component Value Range   Magnesium 2.2  1.5 - 2.5 (mg/dL)  CBC     Status: Abnormal   Collection Time   03/18/11  3:10 AM      Component Value Range   WBC 20.4 (*) 4.0 - 10.5 (K/uL)   RBC 3.16 (*) 4.22 - 5.81 (MIL/uL)   Hemoglobin 9.7 (*) 13.0 - 17.0 (g/dL)   HCT 13.0 (*) 86.5 - 52.0 (%)   MCV 90.2  78.0 - 100.0 (fL)   MCH 30.7  26.0 - 34.0 (pg)   MCHC 34.0  30.0 - 36.0 (g/dL)   RDW 78.4  69.6 - 29.5 (%)   Platelets 481 (*) 150 - 400 (K/uL)  PRO B NATRIURETIC PEPTIDE     Status: Abnormal   Collection Time   03/18/11  3:10 AM      Component Value Range   BNP, POC 1407.0 (*) 0 - 125 (pg/mL)     Blood pressure 120/73, pulse 120, temperature 98.3 F (36.8 C), temperature source Oral, resp. rate 25, height 5\' 10"  (1.778 m), weight 288 lb 5.8 oz (130.8 kg), SpO2 93.00%.  Gen. Pleasant, obese, in no distress, normal affect ENT - no lesions, no post nasal drip, hoarse Neck: No JVD, no thyromegaly, no carotid bruits Lungs: no use of accessory muscles, no dullness to percussion, decreased on R without rales or rhonchi  Cardiovascular: Rhythm regular, heart sounds  normal, no murmurs, trace edema Abdomen: soft, and diffuse-tender, distended, no hepatosplenomegaly, BS hypoactive. JP drain LUQ. 4 dressings in place, RUQ JP drain dressing saturated /c serous fluid.  Musculoskeletal: No deformities, no cyanosis or clubbing Neuro:  alert, non focal Skin:  Warm, no lesions/ rash    Assessment and Plan   R pleural effusion vs atelectasis 2/2 volume overload (+9L), decreased respiratory function, immobility, surgical course complications.  Recommend aggressive pulmonary hygiene - IS, Lasix 20 IV, and decrease IV fluids understanding that pt it hyponatremic and on Cardizem complicating  diuresis. CXR slightly improved /c persistent pleural effusion. Remains + fluid balance. Pt is mobilising 03/18/11.   Afibn - resume PO cardizem when able to take PO, resume xarelto when OK with surgery -total duration per ortho  Deconditioning - PT consult Suture removal by Dr Lequita Halt  GI - change protonix to IV, per sx team    JP drain site per surgery.  ID- dc vanc, ct zosyn/  Can dc clinda in  my opinion (zosyn should coer anaerobes)  Hypokalemia replenish per primary team.    PCCM  Available PRN.   MINOR,WILLIAM S,  03/18/2011, 10:01 AM    Lailyn Appelbaum V.

## 2011-03-18 NOTE — Progress Notes (Signed)
Physical Therapy Treatment Patient Details Name: Steven [name] MRN: [id number] DOB: 1941/03/01 Today's Date: 03/18/2011 9562-1308 1Te, 1Gt  PT Assessment/Plan  PT - Assessment/Plan Comments on Treatment Session: Patient continues with limited tolerance with HR up to 151 with ambulation.  Patient still NPO, but with NG clamped.  Will benefit from continued skilled PT for improved activity tolerance prior to d/c home. PT Plan: Discharge plan remains appropriate;Frequency remains appropriate PT Frequency: Min 5X/week Follow Up Recommendations: Home health PT Equipment Recommended: None recommended by PT PT Goals  Acute Rehab PT Goals PT Goal: Supine/Side to Sit - Progress: Progressing toward goal Pt will go Sit to Stand: with supervision PT Goal: Sit to Stand - Progress: Progressing toward goal Pt will go Stand to Sit: with supervision PT Goal: Stand to Sit - Progress: Progressing toward goal PT Goal: Ambulate - Progress: Progressing toward goal PT Goal: Perform Home Exercise Program - Progress: Progressing toward goal  PT Treatment Precautions/Restrictions  Precautions Precautions: Knee Precaution Comments: no pillow under knee at rest Restrictions Weight Bearing Restrictions: Yes RLE Weight Bearing: Weight bearing as tolerated Other Position/Activity Restrictions: pt able to perform independent SLR ; placed in KI for positioning in knee extension; Mobility (including Balance) Bed Mobility Supine to Sit: 4: Min assist;HOB elevated (Comment degrees) Supine to Sit Details (indicate cue type and reason): HOB 30 degrees Transfers Transfers: Yes Sit to Stand: 4: Min assist;With upper extremity assist;From bed Stand to Sit: 4: Min assist;With upper extremity assist;To chair/3-in-1 Stand to Sit Details: additional assist for safety with lines, room set up; cues for hand placement Ambulation/Gait Ambulation/Gait Assistance: 4: Min assist (+1 for lines, equipment) Ambulation/Gait  Assistance Details (indicate cue type and reason): slightly antalgic on right Ambulation Distance (Feet): 20 Feet Assistive device: Rolling walker  Posture/Postural Control Posture/Postural Control: No significant limitations Exercise  Total Joint Exercises Ankle Circles/Pumps: AROM;Both;10 reps;Supine Quad Sets: AROM;Right;10 reps;Supine Short Arc Quad: AROM;Right;Supine;10 reps Heel Slides: AAROM;Right;10 reps;Supine Hip ABduction/ADduction: AROM;Right;10 reps;Supine Straight Leg Raises: AROM;Right;10 reps;Supine Long Arc Quad: AROM;Right;10 reps;Seated Knee Flexion: AROM;Right;15 reps;Seated End of Session PT - End of Session Equipment Utilized During Treatment: Gait belt;Other (comment) (cryocuff to right knee after activity) Activity Tolerance: Patient tolerated treatment well Patient left: in chair;with family/visitor present;with call bell in reach General Behavior During Session: [facility] for tasks performed Cognition: [facility] for tasks performed  [facility] 03/18/2011, 3:14 PM

## 2011-03-18 NOTE — Progress Notes (Signed)
Subjective: Patient feels a lot better today.  Abdomen is soft  Physical Exam: Blood pressure 120/73, pulse 120, temperature 98 F (36.7 C), temperature source Oral, resp. rate 25, height 5\' 10"  (1.778 m), weight 130.8 kg (288 lb 5.8 oz), SpO2 93.00%.   GEN: NAD ZO:XWRUE with fast rate ABD: Hypoactive bowel sounds, mildly distended EXT: no edema cast RLE    Basic Metabolic Panel:  Basename 03/18/11 0310 03/17/11 0312  NA 131* 129*  K 3.3* 3.5  CL 97 97  CO2 23 25  GLUCOSE 101* 99  BUN 19 21  CREATININE 0.80 0.72  CALCIUM 7.6* 7.7*  MG 2.2 2.1  PHOS -- 2.7   Liver Function Tests:  Basename 03/17/11 0312 03/16/11 1010  AST 35 38*  ALT 19 21  ALKPHOS 81 97  BILITOT 0.6 0.8  PROT 4.7* 5.5*  ALBUMIN 1.6* 1.9*     CBC:  Basename 03/18/11 0310 03/17/11 0312  WBC 20.4* 18.0*  NEUTROABS -- --  HGB 9.7* 9.6*  HCT 28.5* 28.2*  MCV 90.2 89.0  PLT 481* 415*    Ct Angio Chest W/cm &/or Wo Cm  03/16/2011  *RADIOLOGY REPORT*  Clinical Data:  70 year old post cholecystectomy and rule out PE.  CT ANGIOGRAPHY CHEST CT ABDOMEN AND PELVIS WITH CONTRAST  Technique:  Multidetector CT imaging of the chest was performed using the standard protocol during bolus administration of intravenous contrast.  Multiplanar CT image reconstructions including MIPs were obtained to evaluate the vascular anatomy. Multidetector CT imaging of the abdomen and pelvis was performed using the standard protocol during bolus administration of intravenous contrast.  Contrast: OMNIPAQUE IOHEXOL 300 MG/ML IV SOLN  Comparison:  Abdominal CT 03/04/2011  CTA CHEST  Findings:  There has been development of a moderate-sized right pleural effusion. No evidence for pulmonary embolism.  Ascending thoracic aorta measures up to 4.2 cm.  Evidence for coronary artery calcifications.  There is marked volume loss in the right lower lobe and right middle lobe.  Difficult to exclude subcarinal lymphadenopathy due to the  right pleural fluid.  There is volume loss in the left lower lobe.  Minimal aeration in the right middle lobe and right lower lobe.  No acute bony abnormality.   Review of the MIP images confirms the above findings.  IMPRESSION: No evidence for a pulmonary embolism.  Development of a moderate-sized right pleural effusion with collapse of the right lower lobe and right middle lobe.  CT ABDOMEN AND PELVIS  Findings: Patient has undergone a cholecystectomy.  There is a surgical drain in the right upper quadrant.  Small amount of gas and fluid in the cholecystectomy bed.  There is fluid around the liver and some of it may be subcapsular.  Inflammation and fluid in the right upper quadrant.  The stomach and small bowel loops are markedly distended.  The distal ileum is decompressed.  Large amount of free fluid around the bowel loops in the right lower abdomen. There is a small bowel loop that herniates into the ventral abdomen and possibly related to the laparoscopic incision.  This ventral hernia appears to be the source of the small bowel obstruction and transition point.  No significant free fluid in the pelvis.  There is oral contrast in the colon and probably from the study on 03/12/2011  No gross abnormality to the spleen, pancreas, adrenal glands and kidneys.  No acute bony abnormalities.  Review of the MIP images confirms the above findings.  IMPRESSION: Dilated loops of small  bowel and stomach are suggestive for a small bowel obstruction.  The small bowel obstruction is secondary to a ventral hernia from the recent abdominal incision.  There is fluid around the liver and around small bowel loops in the abdomen.  Postoperative changes in the right upper quadrant from the cholecystectomy.  These results were called by telephone on 03/16/2011  at  1:44 p.m. to  Dr. Biagio Quint, who verbally acknowledged these results.  Original Report Authenticated By: Richarda Overlie, M.D.   Ct Abdomen Pelvis W Contrast  03/16/2011   *RADIOLOGY REPORT*  Clinical Data:  70 year old post cholecystectomy and rule out PE.  CT ANGIOGRAPHY CHEST CT ABDOMEN AND PELVIS WITH CONTRAST  Technique:  Multidetector CT imaging of the chest was performed using the standard protocol during bolus administration of intravenous contrast.  Multiplanar CT image reconstructions including MIPs were obtained to evaluate the vascular anatomy. Multidetector CT imaging of the abdomen and pelvis was performed using the standard protocol during bolus administration of intravenous contrast.  Contrast: OMNIPAQUE IOHEXOL 300 MG/ML IV SOLN  Comparison:  Abdominal CT 03/04/2011  CTA CHEST  Findings:  There has been development of a moderate-sized right pleural effusion. No evidence for pulmonary embolism.  Ascending thoracic aorta measures up to 4.2 cm.  Evidence for coronary artery calcifications.  There is marked volume loss in the right lower lobe and right middle lobe.  Difficult to exclude subcarinal lymphadenopathy due to the right pleural fluid.  There is volume loss in the left lower lobe.  Minimal aeration in the right middle lobe and right lower lobe.  No acute bony abnormality.   Review of the MIP images confirms the above findings.  IMPRESSION: No evidence for a pulmonary embolism.  Development of a moderate-sized right pleural effusion with collapse of the right lower lobe and right middle lobe.  CT ABDOMEN AND PELVIS  Findings: Patient has undergone a cholecystectomy.  There is a surgical drain in the right upper quadrant.  Small amount of gas and fluid in the cholecystectomy bed.  There is fluid around the liver and some of it may be subcapsular.  Inflammation and fluid in the right upper quadrant.  The stomach and small bowel loops are markedly distended.  The distal ileum is decompressed.  Large amount of free fluid around the bowel loops in the right lower abdomen. There is a small bowel loop that herniates into the ventral abdomen and possibly related to  the laparoscopic incision.  This ventral hernia appears to be the source of the small bowel obstruction and transition point.  No significant free fluid in the pelvis.  There is oral contrast in the colon and probably from the study on 03/12/2011  No gross abnormality to the spleen, pancreas, adrenal glands and kidneys.  No acute bony abnormalities.  Review of the MIP images confirms the above findings.  IMPRESSION: Dilated loops of small bowel and stomach are suggestive for a small bowel obstruction.  The small bowel obstruction is secondary to a ventral hernia from the recent abdominal incision.  There is fluid around the liver and around small bowel loops in the abdomen.  Postoperative changes in the right upper quadrant from the cholecystectomy.  These results were called by telephone on 03/16/2011  at  1:44 p.m. to  Dr. Biagio Quint, who verbally acknowledged these results.  Original Report Authenticated By: Richarda Overlie, M.D.   Portable Chest X-ray 1 View  03/17/2011  *RADIOLOGY REPORT*  Clinical Data: Follow up effusion.  PORTABLE CHEST -  1 VIEW  Comparison: 1 day prior  Findings: Mildly degraded exam due to AP portable technique and patient body habitus.  Nasogastric extends beyond the  inferior aspect of the film. Minimal motion degradation.  Moderate cardiomegaly.  Lung bases minimally excluded.  Layering right-sided pleural effusion is similar. No pneumothorax.  Diffuse right-sided airspace disease is not changed.  IMPRESSION:  1. No significant change since one day prior. 2.  Right-sided airspace disease and pleural fluid are not significantly changed.  Favor infection or atelectasis.  Asymmetric pulmonary edema felt less likely. 3. Decreased sensitivity and specificity exam due to technique related factors, as described above.  Original Report Authenticated By: Consuello Bossier, M.D.   Dg Chest Port 1 View  03/16/2011  *RADIOLOGY REPORT*  Clinical Data: Postop ventral hernia repair.  Question aspiration.   PORTABLE CHEST - 1 VIEW  Comparison: 03/14/2011  Findings: Increasing airspace disease, right greater than left. Very low lung volumes.  Cardiomegaly.  Airspace disease could represent asymmetric edema.  Aspiration cannot be completely excluded.  Possible small right effusion.  IMPRESSION: Bilateral airspace disease, right greater than left, asymmetric edema versus infection or aspiration.  Cardiomegaly, low lung volumes.  Small right effusion.  Original Report Authenticated By: Cyndie Chime, M.D.      Medications: I have reviewed the patient's current medications.  Impression:  1. Acute cholecystitis. S/p cholecystectomy.  Continue IV abx .  patient had developed a hernia with small bowel obstruction. He was taken back to surgery. He is doing a good post surgery.  2. Hyponatremia. His hyponatremia is improving. He is volume overloaded. Recheck sodium levels tomorrow.   3. Hypertension. D/c HCTZ  on Lisinopril and Cardiazem  4. Status post recent right total knee replacement revision. As per ortho/PT  5. Coronary artery disease, stable.  Stable  6. Leukocytosis Continue IV ABX.   He is coughing up mucus, he could have aspirated per discussion with surgery.  7. Afib/RVR On Cardizem drip,  Cardiology is following.    8.  Full Code   LOS: 6 days   Earlene Plater MD, [facility] 03/18/2011, 7:39 AM

## 2011-03-18 NOTE — Progress Notes (Signed)
Patient ID: Steven [name], male   DOB: August 10, 1940, 70 y.o.   MRN: [id number] 2 Days Post-Op  Subjective: Pt feels a little better today.  Having multiple loose BMs.  Not quite as bloated as yesterday.  Pain well controlled.  Objective: Vital signs in last 24 hours: Temp:  [98 F (36.7 C)-98.7 F (37.1 C)] 98.3 F (36.8 C) (12/04 0800) Pulse Rate:  [101-120] 120  (12/04 0400) Resp:  [18-26] 25  (12/04 0415) BP: (113-135)/(61-73) 120/73 mmHg (12/04 0415) SpO2:  [92 %-98 %] 93 % (12/04 0400) Weight:  [288 lb 5.8 oz (130.8 kg)] 288 lb 5.8 oz (130.8 kg) (12/04 0500) Last BM Date: 03/17/11  Intake/Output from previous day: 12/03 0701 - 12/04 0700 In: 2459 [I.V.:1199; IV Piggyback:800] Out: 1310 [Urine:850; Emesis/NG output:450; Drains:10] Intake/Output this shift: Total I/O In: -  Out: 1 [Stool:1]  PE: Abd: soft, few BS, mild distention, appropriately tender, incision c/d/i.  JP with some serosang output. Ht: tachy Lungs: CTAB  Lab Results:   Basename 03/18/11 0310 03/17/11 0312  WBC 20.4* 18.0*  HGB 9.7* 9.6*  HCT 28.5* 28.2*  PLT 481* 415*   BMET  Basename 03/18/11 0310 03/17/11 0312  NA 131* 129*  K 3.3* 3.5  CL 97 97  CO2 23 25  GLUCOSE 101* 99  BUN 19 21  CREATININE 0.80 0.72  CALCIUM 7.6* 7.7*   PT/INR No results found for this basename: LABPROT:2,INR:2 in the last 72 hours   Studies/Results: Ct Angio Chest W/cm &/or Wo Cm  03/16/2011  *RADIOLOGY REPORT*  Clinical Data:  70 year old post cholecystectomy and rule out PE.  CT ANGIOGRAPHY CHEST CT ABDOMEN AND PELVIS WITH CONTRAST  Technique:  Multidetector CT imaging of the chest was performed using the standard protocol during bolus administration of intravenous contrast.  Multiplanar CT image reconstructions including MIPs were obtained to evaluate the vascular anatomy. Multidetector CT imaging of the abdomen and pelvis was performed using the standard protocol during bolus administration of intravenous contrast.   Contrast: OMNIPAQUE IOHEXOL 300 MG/ML IV SOLN  Comparison:  Abdominal CT 03/04/2011  CTA CHEST  Findings:  There has been development of a moderate-sized right pleural effusion. No evidence for pulmonary embolism.  Ascending thoracic aorta measures up to 4.2 cm.  Evidence for coronary artery calcifications.  There is marked volume loss in the right lower lobe and right middle lobe.  Difficult to exclude subcarinal lymphadenopathy due to the right pleural fluid.  There is volume loss in the left lower lobe.  Minimal aeration in the right middle lobe and right lower lobe.  No acute bony abnormality.   Review of the MIP images confirms the above findings.  IMPRESSION: No evidence for a pulmonary embolism.  Development of a moderate-sized right pleural effusion with collapse of the right lower lobe and right middle lobe.  CT ABDOMEN AND PELVIS  Findings: Patient has undergone a cholecystectomy.  There is a surgical drain in the right upper quadrant.  Small amount of gas and fluid in the cholecystectomy bed.  There is fluid around the liver and some of it may be subcapsular.  Inflammation and fluid in the right upper quadrant.  The stomach and small bowel loops are markedly distended.  The distal ileum is decompressed.  Large amount of free fluid around the bowel loops in the right lower abdomen. There is a small bowel loop that herniates into the ventral abdomen and possibly related to the laparoscopic incision.  This ventral hernia appears to  be the source of the small bowel obstruction and transition point.  No significant free fluid in the pelvis.  There is oral contrast in the colon and probably from the study on 03/12/2011  No gross abnormality to the spleen, pancreas, adrenal glands and kidneys.  No acute bony abnormalities.  Review of the MIP images confirms the above findings.  IMPRESSION: Dilated loops of small bowel and stomach are suggestive for a small bowel obstruction.  The small bowel obstruction  is secondary to a ventral hernia from the recent abdominal incision.  There is fluid around the liver and around small bowel loops in the abdomen.  Postoperative changes in the right upper quadrant from the cholecystectomy.  These results were called by telephone on 03/16/2011  at  1:44 p.m. to  Dr. Biagio Quint, who verbally acknowledged these results.  Original Report Authenticated By: Richarda Overlie, M.D.   Ct Abdomen Pelvis W Contrast  03/16/2011  *RADIOLOGY REPORT*  Clinical Data:  70 year old post cholecystectomy and rule out PE.  CT ANGIOGRAPHY CHEST CT ABDOMEN AND PELVIS WITH CONTRAST  Technique:  Multidetector CT imaging of the chest was performed using the standard protocol during bolus administration of intravenous contrast.  Multiplanar CT image reconstructions including MIPs were obtained to evaluate the vascular anatomy. Multidetector CT imaging of the abdomen and pelvis was performed using the standard protocol during bolus administration of intravenous contrast.  Contrast: OMNIPAQUE IOHEXOL 300 MG/ML IV SOLN  Comparison:  Abdominal CT 03/04/2011  CTA CHEST  Findings:  There has been development of a moderate-sized right pleural effusion. No evidence for pulmonary embolism.  Ascending thoracic aorta measures up to 4.2 cm.  Evidence for coronary artery calcifications.  There is marked volume loss in the right lower lobe and right middle lobe.  Difficult to exclude subcarinal lymphadenopathy due to the right pleural fluid.  There is volume loss in the left lower lobe.  Minimal aeration in the right middle lobe and right lower lobe.  No acute bony abnormality.   Review of the MIP images confirms the above findings.  IMPRESSION: No evidence for a pulmonary embolism.  Development of a moderate-sized right pleural effusion with collapse of the right lower lobe and right middle lobe.  CT ABDOMEN AND PELVIS  Findings: Patient has undergone a cholecystectomy.  There is a surgical drain in the right upper  quadrant.  Small amount of gas and fluid in the cholecystectomy bed.  There is fluid around the liver and some of it may be subcapsular.  Inflammation and fluid in the right upper quadrant.  The stomach and small bowel loops are markedly distended.  The distal ileum is decompressed.  Large amount of free fluid around the bowel loops in the right lower abdomen. There is a small bowel loop that herniates into the ventral abdomen and possibly related to the laparoscopic incision.  This ventral hernia appears to be the source of the small bowel obstruction and transition point.  No significant free fluid in the pelvis.  There is oral contrast in the colon and probably from the study on 03/12/2011  No gross abnormality to the spleen, pancreas, adrenal glands and kidneys.  No acute bony abnormalities.  Review of the MIP images confirms the above findings.  IMPRESSION: Dilated loops of small bowel and stomach are suggestive for a small bowel obstruction.  The small bowel obstruction is secondary to a ventral hernia from the recent abdominal incision.  There is fluid around the liver and around small bowel  loops in the abdomen.  Postoperative changes in the right upper quadrant from the cholecystectomy.  These results were called by telephone on 03/16/2011  at  1:44 p.m. to  Dr. Biagio Quint, who verbally acknowledged these results.  Original Report Authenticated By: Richarda Overlie, M.D.   Dg Chest Port 1 View  03/18/2011  *RADIOLOGY REPORT*  Clinical Data: Effusion.  PORTABLE CHEST - 1 VIEW  Comparison:  Single view chest 03/17/2011.  Findings:  An NG tube remains in place.  The heart is mildly enlarged.  A layering right pleural effusion persist.  Right lower lobe airspace disease is present.  There is improved aeration at the left lung base.  Mild edematous changes persist.  The visualized soft tissues and bony thorax are unremarkable.  IMPRESSION:  1.  Slight improved aeration at the left lung base. 2.  Persistent layering  effusion and atelectasis at the right lung base. 3.  NG tube.  Original Report Authenticated By: Jamesetta Orleans. MATTERN, M.D.   Portable Chest X-ray 1 View  03/17/2011  *RADIOLOGY REPORT*  Clinical Data: Follow up effusion.  PORTABLE CHEST - 1 VIEW  Comparison: 1 day prior  Findings: Mildly degraded exam due to AP portable technique and patient body habitus.  Nasogastric extends beyond the  inferior aspect of the film. Minimal motion degradation.  Moderate cardiomegaly.  Lung bases minimally excluded.  Layering right-sided pleural effusion is similar. No pneumothorax.  Diffuse right-sided airspace disease is not changed.  IMPRESSION:  1. No significant change since one day prior. 2.  Right-sided airspace disease and pleural fluid are not significantly changed.  Favor infection or atelectasis.  Asymmetric pulmonary edema felt less likely. 3. Decreased sensitivity and specificity exam due to technique related factors, as described above.  Original Report Authenticated By: Consuello Bossier, M.D.   Dg Chest Port 1 View  03/16/2011  *RADIOLOGY REPORT*  Clinical Data: Postop ventral hernia repair.  Question aspiration.  PORTABLE CHEST - 1 VIEW  Comparison: 03/14/2011  Findings: Increasing airspace disease, right greater than left. Very low lung volumes.  Cardiomegaly.  Airspace disease could represent asymmetric edema.  Aspiration cannot be completely excluded.  Possible small right effusion.  IMPRESSION: Bilateral airspace disease, right greater than left, asymmetric edema versus infection or aspiration.  Cardiomegaly, low lung volumes.  Small right effusion.  Original Report Authenticated By: Cyndie Chime, M.D.    Anti-infectives: Anti-infectives     Start     Dose/Rate Route Frequency Ordered Stop   03/17/11 1200   clindamycin (CLEOCIN) IVPB 300 mg        300 mg 100 mL/hr over 30 Minutes Intravenous 4 times per day 03/17/11 1125     03/17/11 0300   vancomycin (VANCOCIN) 1,250 mg in sodium chloride 0.9 %  250 mL IVPB        1,250 mg 166.7 mL/hr over 90 Minutes Intravenous Every 12 hours 03/16/11 1426     03/16/11 1500   vancomycin (VANCOCIN) 2,000 mg in sodium chloride 0.9 % 500 mL IVPB        2,000 mg 250 mL/hr over 120 Minutes Intravenous  Once 03/16/11 1426 03/16/11 2015   03/12/11 0900  piperacillin-tazobactam (ZOSYN) IVPB 3.375 g       3.375 g 12.5 mL/hr over 240 Minutes Intravenous Every 8 hours 03/12/11 0741     03/12/11 0545   Ampicillin-Sulbactam (UNASYN) 3 g in sodium chloride 0.9 % 100 mL IVPB        3 g 100 mL/hr over  60 Minutes Intravenous  Once 03/12/11 0541 03/12/11 0653           Assessment/Plan  1. S/p lap chole with repeat surgery for incarcerated umbilical hernia 2. Post-op ileus, improving 3. A. Fib 4 recent TKA  Plan: Clamp NGT for 6 hrs, if no nausea then d/c and keep npo except ice and sips Pt needs to be ambulating more than he is Cont IV abx.   LOS: 6 days    Andrya Roppolo E 03/18/2011

## 2011-03-18 NOTE — Progress Notes (Signed)
Pt has not had any N/V today, NGT was clamped at 1200 today, orders to d/c after 6 hours if pt tolerated well. Pt tolerated well NGT d/c'd. Ice chips and sips of clears started/ introduced slowly. Will continue to monitor.

## 2011-03-19 DIAGNOSIS — E876 Hypokalemia: Secondary | ICD-10-CM | POA: Diagnosis present

## 2011-03-19 LAB — BASIC METABOLIC PANEL
CO2: 25 mEq/L (ref 19–32)
GFR calc Af Amer: >90 mL/min (ref >90)
GFR calc non Af Amer: 89 mL/min — ABNORMAL LOW (ref >90)
Glucose, Bld: 99 mg/dL (ref 70–99)
Glucose, Bld: 107 mg/dL — ABNORMAL HIGH (ref 70–99)
Potassium: 2.8 mEq/L — ABNORMAL LOW (ref 3.5–5.1)
Potassium: 3.2 mEq/L — ABNORMAL LOW (ref 3.5–5.1)
Sodium: 131 mEq/L — ABNORMAL LOW (ref 135–145)
Sodium: 131 mEq/L — ABNORMAL LOW (ref 135–145)

## 2011-03-19 LAB — CBC
Hemoglobin: 9.2 g/dL — ABNORMAL LOW (ref 13.0–17.0)
RBC: 3.06 MIL/uL — ABNORMAL LOW (ref 4.22–5.81)

## 2011-03-19 MED ORDER — POTASSIUM CHLORIDE 10 MEQ/100ML IV SOLN
10.0000 meq | INTRAVENOUS | Status: AC
  Administered 2011-03-19 (×6): 10 meq via INTRAVENOUS
  Filled 2011-03-19 (×6): qty 100

## 2011-03-19 NOTE — Progress Notes (Signed)
Physical Therapy Treatment Patient Details Name: Steven [name] MRN: [id number] DOB: 1941-04-09 Today's Date: 03/19/2011 [id number] 1GT, 1TE  PT Assessment/Plan  PT - Assessment/Plan Comments on Treatment Session: Patient progressed with HR only up to 137 with ambulation with increased distance today.  Will need further rehab for return to tolerating home activity.  No NG tube and tolerating ice chips. PT Plan: Discharge plan remains appropriate PT Frequency: Min 5X/week Follow Up Recommendations: Home health PT PT Goals  Acute Rehab PT Goals PT Goal: Supine/Side to Sit - Progress: Progressing toward goal PT Goal: Sit to Stand - Progress: Progressing toward goal PT Goal: Stand to Sit - Progress: Progressing toward goal PT Goal: Ambulate - Progress: Progressing toward goal PT Goal: Perform Home Exercise Program - Progress: Progressing toward goal  PT Treatment Precautions/Restrictions  Precautions Precautions: Knee Precaution Comments: no pillow under knee at rest Restrictions Weight Bearing Restrictions: Yes RLE Weight Bearing: Weight bearing as tolerated Other Position/Activity Restrictions: pt able to perform independent SLR ; placed in KI for positioning in knee extension; Mobility (including Balance) Bed Mobility Supine to Sit: 4: Min assist Supine to Sit Details (indicate cue type and reason): with rails Transfers Sit to Stand: 4: Min assist;From bed;With upper extremity assist Sit to Stand Details (indicate cue type and reason): cues for hand placement Stand to Sit: 4: Min assist;With upper extremity assist;With armrests;To chair/3-in-1 Ambulation/Gait Ambulation/Gait Assistance: 4: Min assist Ambulation Distance (Feet): 65 Feet Assistive device: Rolling walker    Exercise  Total Joint Exercises Ankle Circles/Pumps: AROM;Both;10 reps;Supine Quad Sets: AROM;Right;10 reps;Supine Short Arc Quad: AROM;Right;10 reps;Supine Heel Slides: AROM;AAROM;Right;20 reps;Supine Hip  ABduction/ADduction: AROM;Right;10 reps;Supine Straight Leg Raises: AROM;Right;10 reps;Supine End of Session PT - End of Session Equipment Utilized During Treatment: Gait belt Activity Tolerance: Patient tolerated treatment well (HR improved with activity compared to yesterday) Patient left: in chair  [facility] 03/19/2011, 10:58 AM

## 2011-03-19 NOTE — Progress Notes (Signed)
Patient ID: Steven [name], male   DOB: 29-Apr-1940, 70 y.o.   MRN: [id number] 3 Days Post-Op  Subjective:  Pain continues to improve.  +BM, + flatus, no nausea.  Objective: Vital signs in last 24 hours: Temp:  [97.3 F (36.3 C)-98.2 F (36.8 C)] 97.8 F (36.6 C) (12/05 1158) Pulse Rate:  [91-137] 103  (12/05 1000) Resp:  [17-29] 19  (12/05 1000) BP: (117-137)/(64-79) 117/64 mmHg (12/05 0400) SpO2:  [91 %-96 %] 93 % (12/05 1000) Last BM Date: 03/19/11  Intake/Output from previous day: 12/04 0701 - 12/05 0700 In: 1380.3 [I.V.:780.3; IV Piggyback:600] Out: 1427 [Urine:1425; Stool:2] Intake/Output this shift: Total I/O In: 100 [I.V.:100] Out: -   PE: Abd: soft, few BS,  appropriately tender, incision c/d/i.  JP with some serosang output. Ht: tachycardic   Lab Results:   Basename 03/19/11 0255 03/18/11 0310  WBC 18.2* 20.4*  HGB 9.2* 9.7*  HCT 27.2* 28.5*  PLT 513* 481*   BMET  Basename 03/19/11 0255 03/18/11 0310  NA 131* 131*  K 2.8* 3.3*  CL 97 97  CO2 22 23  GLUCOSE 99 101*  BUN 19 19  CREATININE 0.80 0.80  CALCIUM 7.6* 7.6*   PT/INR No results found for this basename: LABPROT:2,INR:2 in the last 72 hours   Studies/Results: Dg Chest Port 1 View  03/18/2011  *RADIOLOGY REPORT*  Clinical Data: Effusion.  PORTABLE CHEST - 1 VIEW  Comparison:  Single view chest 03/17/2011.  Findings:  An NG tube remains in place.  The heart is mildly enlarged.  A layering right pleural effusion persist.  Right lower lobe airspace disease is present.  There is improved aeration at the left lung base.  Mild edematous changes persist.  The visualized soft tissues and bony thorax are unremarkable.  IMPRESSION:  1.  Slight improved aeration at the left lung base. 2.  Persistent layering effusion and atelectasis at the right lung base. 3.  NG tube.  Original Report Authenticated By: Jamesetta Orleans. MATTERN, M.D.    Anti-infectives: Anti-infectives     Start     Dose/Rate Route Frequency  Ordered Stop   03/17/11 1200   clindamycin (CLEOCIN) IVPB 300 mg        300 mg 100 mL/hr over 30 Minutes Intravenous 4 times per day 03/17/11 1125     03/17/11 0300   vancomycin (VANCOCIN) 1,250 mg in sodium chloride 0.9 % 250 mL IVPB  Status:  Discontinued        1,250 mg 166.7 mL/hr over 90 Minutes Intravenous Every 12 hours 03/16/11 1426 03/18/11 1357   03/16/11 1500   vancomycin (VANCOCIN) 2,000 mg in sodium chloride 0.9 % 500 mL IVPB        2,000 mg 250 mL/hr over 120 Minutes Intravenous  Once 03/16/11 1426 03/16/11 2015   03/12/11 0900   piperacillin-tazobactam (ZOSYN) IVPB 3.375 g        3.375 g 12.5 mL/hr over 240 Minutes Intravenous Every 8 hours 03/12/11 0741     03/12/11 0545   Ampicillin-Sulbactam (UNASYN) 3 g in sodium chloride 0.9 % 100 mL IVPB        3 g 100 mL/hr over 60 Minutes Intravenous  Once 03/12/11 0541 03/12/11 0653           Assessment/Plan  1. S/p lap chole with repeat surgery for incarcerated umbilical hernia 2. Post-op ileus, appears to be resolved 3. A. Fib 4 recent TKA  Plan: Clears, advance to regular as tolerated. Cont IV abx.  LOS: 7 days    [facility] 03/19/2011

## 2011-03-19 NOTE — Progress Notes (Signed)
eLink Physician-Brief Progress Note Patient Name: Steven [name] DOB: Apr 05, 1941 MRN: [id number]  Date of Service  03/19/2011   HPI/Events of Note   hypokalemia  eICU Interventions  Potassium replaced   Intervention Category Intermediate Interventions: Electrolyte abnormality - evaluation and management  Amra Shukla 03/19/2011, 4:40 AM

## 2011-03-19 NOTE — Progress Notes (Signed)
Subjective:  Patient went into atrial fibrillation on 03/14/11 following TKR. Developed SBO following cholecystectomy requiring repeat Abdominal surgery. Still in afib with controlled rate on IV diltiazem. HR increases to 150 bpm with PT. Denies any SOB or chest pain.NG tube now out. Having liquid BMs. On ice chips.  Objective:  Vital Signs in the last 24 hours: BP 117/64  Pulse 97  Temp(Src) 98.2 F (36.8 C) (Oral)  Resp 18  Ht 5\' 10"  (1.778 m)  Wt 130.8 kg (288 lb 5.8 oz)  BMI 41.38 kg/m2  SpO2 95%  Physical Exam:  Pleasant obese male currently in no acute distress.  Lungs: Clear anteriorly. Decreased BS at bases. Cardiac: Irregular rhythm, normal S1-S2, No murmur or S3. Abdomen mildly distended. Drain in RUQ.  Extremities: Scars from previous knee replacement on the right knee, no definite tenderness or Homans sign. No edema  Intake/Output from previous day: 12/04 0701 - 12/05 0700 In: 975.3 [I.V.:625.3; IV Piggyback:350] Out: 1302 [Urine:1300; Stool:2]  Lab Results: Basic Metabolic Panel:  [facility] 03/19/11 0255 03/18/11 0310  NA 131* 131*  K 2.8* 3.3*  CL 97 97  CO2 22 23  GLUCOSE 99 101*  BUN 19 19  CREATININE 0.80 0.80    CBC:  Basename 03/19/11 0255 03/18/11 0310  WBC 18.2* 20.4*  NEUTROABS -- --  HGB 9.2* 9.7*  HCT 27.2* 28.5*  MCV 88.9 90.2  PLT 513* 481*   TSH is normal.  Telemetry: Atrial fibrillation with controlled response. Rate 90-100 bpm.  Echo shows mild LVH with normal systolic function EF 65-70%. Mild LAE.  Assessment/Plan:  1. Postoperative atrial fibrillation with continuance.  Rate controlled on IV diltiazem. Increase HR with activity consistent with deconditioning. Will switch to po diltiazem when taking pos well. 2. Coronary artery disease stable 3. Hyponatremia improved. 4. SBO s/p choly 5. S/p TKR 6. Marked hypokalemia.  Recommendations:  Continue IV diltiazem.  Continue SQ heparin for DVT prophylaxis. Will transition  diltiazem to po once able to take po's. Will resume Xarelto when taking pos. Potassium being repleted now IV.   Merlyn Bollen [place] MD  03/19/2011, 6:32 AM

## 2011-03-19 NOTE — Progress Notes (Addendum)
Subjective: Patient feels a lot better today. His NG tube has been discontinued. He feels a lot better today now he is anticipating going home.  Physical Exam: Blood pressure 117/64, pulse 97, temperature 98.2 F (36.8 C), temperature source Oral, resp. rate 18, height 5\' 10"  (1.778 m), weight 130.8 kg (288 lb 5.8 oz), SpO2 95.00%.   GEN: NAD [id number] with fast rate ABD: Positive bowel sounds, mildly distended EXT: no edema cast RLE    Basic Metabolic Panel:  Basename 03/19/11 0255 03/18/11 0310 03/17/11 0312  NA 131* 131* --  K 2.8* 3.3* --  CL 97 97 --  CO2 22 23 --  GLUCOSE 99 101* --  BUN 19 19 --  CREATININE 0.80 0.80 --  CALCIUM 7.6* 7.6* --  MG -- 2.2 2.1  PHOS -- -- 2.7   Liver Function Tests:  [facility] 03/17/11 0312 03/16/11 1010  AST 35 38*  ALT 19 21  ALKPHOS 81 97  BILITOT 0.6 0.8  PROT 4.7* 5.5*  ALBUMIN 1.6* 1.9*     CBC:  Basename 03/19/11 0255 03/18/11 0310  WBC 18.2* 20.4*  NEUTROABS -- --  HGB 9.2* 9.7*  HCT 27.2* 28.5*  MCV 88.9 90.2  PLT 513* 481*    Dg Chest Port 1 View  03/18/2011  *RADIOLOGY REPORT*  Clinical Data: Effusion.  PORTABLE CHEST - 1 VIEW  Comparison:  Single view chest 03/17/2011.  Findings:  An NG tube remains in place.  The heart is mildly enlarged.  A layering right pleural effusion persist.  Right lower lobe airspace disease is present.  There is improved aeration at the left lung base.  Mild edematous changes persist.  The visualized soft tissues and bony thorax are unremarkable.  IMPRESSION:  1.  Slight improved aeration at the left lung base. 2.  Persistent layering effusion and atelectasis at the right lung base. 3.  NG tube.  Original Report Authenticated By: Jamesetta Orleans. MATTERN, M.D.      Medications: I have reviewed the patient's current medications.  Impression:  1. Acute cholecystitis. S/p cholecystectomy.  Continue IV abx .  patient had developed a hernia with small bowel obstruction. He was taken back to  surgery. He is doing a good post surgery.  I think his antibiotic can be changed to by mouth tomorrow.  2. Hyponatremia. His hyponatremia is improving, initially was thought to be secondary to dehydration in addition to hydrochlorothiazide..   3. Hypertension. D/ced HCTZ  on Lisinopril and Cardiazem  4. Status post recent right total knee replacement revision. As per ortho/PT  5. Coronary artery disease, stable.  Stable  6. Leukocytosis This is secondary to the infection,  should be able to transition to by mouth antibiotics tomorrow if okay with surgery     7. Question of aspiration There was a question of some aspiration during his second surgery. he was also coughing up mucus, he could have aspirated per discussion with surgery. He is presently on clindamycin IV and hopefully can transition to by mouth antibiotics tomorrow.  7. Afib/RVR On Cardizem drip,  Cardiology is following.    8. Hypokalemia: Replete and check magnesium level.  9.  Full Code  10. Disposition: I suspect the patient should be able to be discharged within the next 2-3 days.  Discussed with patient and his wife. LOS: 6 days   Earlene Plater MD, [place] 03/19/2011, 8:19 AM

## 2011-03-20 LAB — BASIC METABOLIC PANEL
BUN: 16 mg/dL (ref 6–23)
CO2: 23 mEq/L (ref 19–32)
Chloride: 99 mEq/L (ref 96–112)
GFR calc non Af Amer: 90 mL/min — ABNORMAL LOW (ref >90)
Glucose, Bld: 108 mg/dL — ABNORMAL HIGH (ref 70–99)
Potassium: 3.1 mEq/L — ABNORMAL LOW (ref 3.5–5.1)
Sodium: 131 mEq/L — ABNORMAL LOW (ref 135–145)

## 2011-03-20 LAB — CBC
HCT: 27.7 % — ABNORMAL LOW (ref 39.0–52.0)
Hemoglobin: 9.2 g/dL — ABNORMAL LOW (ref 13.0–17.0)
MCHC: 33.2 g/dL (ref 30.0–36.0)
RBC: 3.08 MIL/uL — ABNORMAL LOW (ref 4.22–5.81)
WBC: 13.3 10*3/uL — ABNORMAL HIGH (ref 4.0–10.5)

## 2011-03-20 MED ORDER — RIVAROXABAN 10 MG PO TABS
20.0000 mg | ORAL_TABLET | Freq: Every day | ORAL | Status: DC
  Administered 2011-03-21: 20 mg via ORAL
  Filled 2011-03-20: qty 2

## 2011-03-20 MED ORDER — POTASSIUM CHLORIDE CRYS ER 20 MEQ PO TBCR
40.0000 meq | EXTENDED_RELEASE_TABLET | Freq: Every day | ORAL | Status: DC
  Administered 2011-03-20 – 2011-03-21 (×2): 40 meq via ORAL
  Filled 2011-03-20 (×2): qty 2

## 2011-03-20 MED ORDER — RIVAROXABAN 10 MG PO TABS
20.0000 mg | ORAL_TABLET | Freq: Every day | ORAL | Status: DC
  Administered 2011-03-20 (×2): 10 mg via ORAL
  Filled 2011-03-20 (×2): qty 2

## 2011-03-20 MED ORDER — DILTIAZEM HCL 60 MG PO TABS
60.0000 mg | ORAL_TABLET | Freq: Four times a day (QID) | ORAL | Status: DC
  Administered 2011-03-20 – 2011-03-21 (×5): 60 mg via ORAL
  Filled 2011-03-20 (×9): qty 1

## 2011-03-20 NOTE — Progress Notes (Signed)
Physical Therapy Treatment Patient Details Name: Steven [name] MRN: [id number] DOB: 1940/07/02 Today's Date: 03/20/2011 [id number]  PT Assessment/Plan  PT - Assessment/Plan Comments on Treatment Session: Patient tolerating increased strenuous exercises for right LE in sitting and standing.  Ambulated after being up to bathe with no worse signs of activity intolerance. PT Plan: Discharge plan remains appropriate PT Frequency: Min 5X/week Follow Up Recommendations: Home health PT PT Goals  Acute Rehab PT Goals PT Goal: Sit to Stand - Progress: Met PT Goal: Stand to Sit - Progress: Progressing toward goal PT Goal: Ambulate - Progress: Progressing toward goal PT Goal: Perform Home Exercise Program - Progress: Progressing toward goal  PT Treatment Precautions/Restrictions  Precautions Precautions: Knee Precaution Comments: no pillow under knee at rest Restrictions Weight Bearing Restrictions: Yes RLE Weight Bearing: Weight bearing as tolerated Other Position/Activity Restrictions: pt able to perform independent SLR ; placed in KI for positioning in knee extension; Mobility (including Balance) Bed Mobility Bed Mobility: No (Pt up in chair) Transfers Sit to Stand: 6: Modified independent (Device/Increase time);From chair/3-in-1;With upper extremity assist;With armrests Stand to Sit: 4: Min assist;With upper extremity assist;To chair/3-in-1 Stand to Sit Details: to chair with out armrests...min assist for safety Ambulation/Gait Ambulation/Gait Assistance: 4: Min assist Ambulation/Gait Assistance Details (indicate cue type and reason): minguard assist for safety.  Cues for upright posture Ambulation Distance (Feet): 50 Feet (times two) Assistive device: Rolling walker    Exercise  Total Joint Exercises Heel Slides: AAROM;AROM;Right;10 reps;Seated (using left leg to assist for increased ROM right knee) Long Arc Quad: AROM;Right;10 reps;Seated General Exercises - Lower  Extremity Mini-Sqauts: AROM;Both;10 reps;Standing Other Exercises Other Exercises: Standing hamstring curls with UE support x 10 reps on right LE End of Session PT - End of Session Equipment Utilized During Treatment: Gait belt Activity Tolerance: Patient tolerated treatment well (HR max 140, Spo2 low 79% (up to 94% in <2 min)) pt. on room air Patient left: in chair General Behavior During Session: [facility] for tasks performed Cognition: [facility] for tasks performed  [facility] 03/20/2011, 12:32 PM

## 2011-03-20 NOTE — Progress Notes (Signed)
Patient ID: Steven [name], male   DOB: 1940/04/22, 70 y.o.   MRN: [id number] 4 Days Post-Op  Subjective: Pt sleepy, but really wants to go home.  Ate some solid food this am.  Doesn't have much appetite and things don't really taste good either.  Objective: Vital signs in last 24 hours: Temp:  [97.5 F (36.4 C)-98.3 F (36.8 C)] 97.5 F (36.4 C) (12/06 0800) Pulse Rate:  [85-137] 94  (12/06 0420) Resp:  [19-25] 20  (12/06 0420) BP: (103-114)/(53-72) 103/69 mmHg (12/06 0807) SpO2:  [91 %-95 %] 93 % (12/06 0420) Last BM Date: 03/19/11  Intake/Output from previous day: 12/05 0701 - 12/06 0700 In: 1640 [I.V.:1340; IV Piggyback:300] Out: 1000 [Urine:1000] Intake/Output this shift:    PE: Abd: soft, minimally tender, +BS, JP with serosang output.  Incisions c/d/i  Lab Results:   Basename 03/20/11 0318 03/19/11 0255  WBC 13.3* 18.2*  HGB 9.2* 9.2*  HCT 27.7* 27.2*  PLT 499* 513*   BMET  Basename 03/20/11 0318 03/19/11 1330  NA 131* 131*  K 3.1* 3.2*  CL 99 100  CO2 23 25  GLUCOSE 108* 107*  BUN 16 18  CREATININE 0.79 0.82  CALCIUM 7.7* 7.6*   PT/INR No results found for this basename: LABPROT:2,INR:2 in the last 72 hours   Studies/Results: No results found.  Anti-infectives: Anti-infectives     Start     Dose/Rate Route Frequency Ordered Stop   03/17/11 1200   clindamycin (CLEOCIN) IVPB 300 mg        300 mg 100 mL/hr over 30 Minutes Intravenous 4 times per day 03/17/11 1125     03/17/11 0300   vancomycin (VANCOCIN) 1,250 mg in sodium chloride 0.9 % 250 mL IVPB  Status:  Discontinued        1,250 mg 166.7 mL/hr over 90 Minutes Intravenous Every 12 hours 03/16/11 1426 03/18/11 1357   03/16/11 1500   vancomycin (VANCOCIN) 2,000 mg in sodium chloride 0.9 % 500 mL IVPB        2,000 mg 250 mL/hr over 120 Minutes Intravenous  Once 03/16/11 1426 03/16/11 2015   03/12/11 0900  piperacillin-tazobactam (ZOSYN) IVPB 3.375 g       3.375 g 12.5 mL/hr over 240 Minutes  Intravenous Every 8 hours 03/12/11 0741     03/12/11 0545   Ampicillin-Sulbactam (UNASYN) 3 g in sodium chloride 0.9 % 100 mL IVPB        3 g 100 mL/hr over 60 Minutes Intravenous  Once 03/12/11 0541 03/12/11 0653           Assessment/Plan  1. S/p lap chole 2. S/p umbilical hernia repair  Plan: D/c JP drain today Encourage po intake Cont ambulation Stable for a floor from our standpoint Resume Xarelto per primary Patient surgically stable   LOS: 8 days    Steven [name] 03/20/2011

## 2011-03-20 NOTE — Progress Notes (Signed)
Seen, agree with above.   

## 2011-03-20 NOTE — Progress Notes (Signed)
Subjective:  Patient went into atrial fibrillation on 03/14/11 following TKR. Developed SBO following cholecystectomy requiring repeat Abdominal surgery. Still in afib with controlled rate on IV diltiazem. Tolerating clear liquids now without nausea or vomiting. Having small BMs.  Objective:  Vital Signs in the last 24 hours: BP 108/67  Pulse 94  Temp(Src) 98.3 F (36.8 C) (Oral)  Resp 20  Ht 5\' 10"  (1.778 m)  Wt 130.8 kg (288 lb 5.8 oz)  BMI 41.38 kg/m2  SpO2 93%  Physical Exam:  Pleasant obese male currently in no acute distress.  Lungs: Clear anteriorly. Decreased BS at bases. Cardiac: Irregular rhythm, normal S1-S2, No murmur or S3. Abdomen mildly distended. Drain in RUQ.  Extremities: Scars from previous knee replacement on the right knee. No edema  Intake/Output from previous day: 12/05 0701 - 12/06 0700 In: 1640 [I.V.:1340; IV Piggyback:300] Out: 1000 [Urine:1000]  Lab Results: Basic Metabolic Panel:  Basename 03/20/11 0318 03/19/11 1330  NA 131* 131*  K 3.1* 3.2*  CL 99 100  CO2 23 25  GLUCOSE 108* 107*  BUN 16 18  CREATININE 0.79 0.82    CBC:  Basename 03/20/11 0318 03/19/11 0255  WBC 13.3* 18.2*  NEUTROABS -- --  HGB 9.2* 9.2*  HCT 27.7* 27.2*  MCV 89.9 88.9  PLT 499* 513*   TSH is normal.  Telemetry: Atrial fibrillation with controlled response. Rate 90 bpm.  Echo shows mild LVH with normal systolic function EF 65-70%. Mild LAE.  Assessment/Plan:  1. Postoperative atrial fibrillation with continuance.  Rate controlled on IV diltiazem. Will switch to po diltiazem today. 2. Coronary artery disease stable 3. Hyponatremia improved. 4. SBO s/p choly 5. S/p TKR 6. Hypokalemia.  Recommendations:  Switch Diltiazem to po. Resume Xarelto. Advance activity and diet per general surgery.   Peter [place] MD  03/20/2011, 7:05 AM

## 2011-03-20 NOTE — Progress Notes (Signed)
Subjective: Pt is currently feeling better than yesterday.  Denies any fever or chills.   Objective: Filed Vitals:   03/20/11 0002 03/20/11 0420 03/20/11 0800 03/20/11 0807  BP: 107/62 108/67  103/69  Pulse: 85 94    Temp: 98.1 F (36.7 C) 98.3 F (36.8 C) 97.5 F (36.4 C)   TempSrc: Oral Oral Oral   Resp: 21 20    Height:      Weight:      SpO2: 93% 93%  95%   Weight change:   Intake/Output Summary (Last 24 hours) at 03/20/11 1141 Last data filed at 03/20/11 1000  Gross per 24 hour  Intake   1690 ml  Output   1000 ml  Net    690 ml    General: Alert, awake, oriented x3, in no acute distress.  HEENT: No bruits, no goiter.  Heart: Irregular rhythm, normal s1 and s2  Lungs: Crackles left side, bilateral air movement.  Abdomen: Soft, nontender, nondistended, positive bowel sounds.  JP in place no erythema or cellulitis noted Neuro: Grossly intact, nonfocal.   Lab Results:  Basename 03/20/11 0318 03/19/11 1330 03/18/11 0310  NA 131* 131* --  K 3.1* 3.2* --  CL 99 100 --  CO2 23 25 --  GLUCOSE 108* 107* --  BUN 16 18 --  CREATININE 0.79 0.82 --  CALCIUM 7.7* 7.6* --  MG -- 2.2 2.2  PHOS -- -- --   No results found for this basename: AST:2,ALT:2,ALKPHOS:2,BILITOT:2,PROT:2,ALBUMIN:2 in the last 72 hours No results found for this basename: LIPASE:2,AMYLASE:2 in the last 72 hours  Basename 03/20/11 0318 03/19/11 0255  WBC 13.3* 18.2*  NEUTROABS -- --  HGB 9.2* 9.2*  HCT 27.7* 27.2*  MCV 89.9 88.9  PLT 499* 513*   No results found for this basename: CKTOTAL:3,CKMB:3,CKMBINDEX:3,TROPONINI:3 in the last 72 hours  Basename 03/18/11 0310  POCBNP 1407.0*   No results found for this basename: DDIMER:2 in the last 72 hours No results found for this basename: HGBA1C:2 in the last 72 hours No results found for this basename: CHOL:2,HDL:2,LDLCALC:2,TRIG:2,CHOLHDL:2,LDLDIRECT:2 in the last 72 hours No results found for this basename: TSH,T4TOTAL,FREET3,T3FREE,THYROIDAB  in the last 72 hours No results found for this basename: VITAMINB12:2,FOLATE:2,FERRITIN:2,TIBC:2,IRON:2,RETICCTPCT:2 in the last 72 hours  Micro Results: No results found for this or any previous visit (from the past 240 hour(s)). I have reviewed the patient's current medications. Studies/Results: No results found.  Medications: I have reviewed patient current medication list.   Patient Active Hospital Problem List: Acute cholecystitis (03/12/2011) Resolving.  Surgery on board.  Since WBC is still elevated I will plan on continuing pts IV antibiotic zosyn  Pt is improved and plan is to have jp drain removed today. Will d/c clindamycin today. Wbc trending down but still elevated. CORONARY ATHEROSCLEROSIS NATIVE CORONARY ARTERY (03/20/2010)   Stable cards is on board HTN (hypertension) (03/12/2011)  Stable on current drug regimen will continue Hypercholesteremia (03/12/2011)   Stable Chronic knee pain (03/12/2011)  Well controlled on current drug regimen Hyponatremia (03/12/2011) Will continue to monitor. Anemia (03/12/2011)  Will continue to monitor Atrial fibrillation (03/17/2011)  Cards on board.  Will continue xarelto Hypokalemia (03/19/2011)   Plan on replace today.    LOS: 8 days   Penny Pia M.D.  [facility] 03/20/2011, 11:41 AM

## 2011-03-21 LAB — BASIC METABOLIC PANEL
BUN: 17 mg/dL (ref 6–23)
CO2: 24 mEq/L (ref 19–32)
Calcium: 7.8 mg/dL — ABNORMAL LOW (ref 8.4–10.5)
Glucose, Bld: 109 mg/dL — ABNORMAL HIGH (ref 70–99)
Sodium: 131 mEq/L — ABNORMAL LOW (ref 135–145)

## 2011-03-21 LAB — CBC
HCT: 27.7 % — ABNORMAL LOW (ref 39.0–52.0)
Hemoglobin: 9.1 g/dL — ABNORMAL LOW (ref 13.0–17.0)
MCH: 30.1 pg (ref 26.0–34.0)
RBC: 3.02 MIL/uL — ABNORMAL LOW (ref 4.22–5.81)

## 2011-03-21 MED ORDER — DILTIAZEM HCL ER COATED BEADS 240 MG PO CP24
240.0000 mg | ORAL_CAPSULE | Freq: Every day | ORAL | Status: DC
  Administered 2011-03-21: 240 mg via ORAL
  Filled 2011-03-21: qty 1

## 2011-03-21 MED ORDER — POTASSIUM CHLORIDE CRYS ER 20 MEQ PO TBCR: 20.0000 meq | EXTENDED_RELEASE_TABLET | Freq: Every day | ORAL | Status: DC

## 2011-03-21 MED ORDER — EPINEPHRINE HCL 0.1 MG/ML IJ SOLN
INTRAMUSCULAR | Status: AC
  Filled 2011-03-21: qty 10

## 2011-03-21 MED ORDER — RIVAROXABAN 20 MG PO TABS: 20.0000 mg | ORAL_TABLET | Freq: Every day | ORAL | Status: DC

## 2011-03-21 MED ORDER — DILTIAZEM HCL ER COATED BEADS 240 MG PO CP24: 240.0000 mg | ORAL_CAPSULE | Freq: Every day | ORAL | Status: DC

## 2011-03-21 NOTE — Discharge Summary (Signed)
Physician Discharge Summary  Patient ID: Steven [name] MRN: [id number] DOB/AGE: 1940/07/02 70 y.o.  Admit date: 03/12/2011 Discharge date: 03/21/2011  Admission Diagnoses:Acute Cholecystitis  Discharge Diagnoses:  Principal Problem:  *Acute cholecystitis Active Problems:  CORONARY ATHEROSCLEROSIS NATIVE CORONARY ARTERY  HTN (hypertension)  Hypercholesteremia  Chronic knee pain  Hyponatremia  Anemia  Atrial fibrillation  Hypokalemia   Discharged Condition: fair  Hospital Course:  Pt is a 70 y/o that was admitted secondary to cholecystitis.  Within this admission he was seen and followed by surgery which performed a cholecystectomy.  Pt shortly after the procedure developed a-fib and was seen and followed by cardiology.  He was placed on diltiazem gtt and later was able to be placed on long acting oral diltiazem.  Currently patient is sinus rhythm on medication.  He has been recommended to be on xarelto for stroke prevention given his recent diagnosis of atrial fibrillation.  Currently patient is afebrile and through the course of his stay he was given IV antibiotics.  His WBC is normalized today and he will be switched to oral antibiotics.  Patient is to go and get a CBC count tomorrow at a lab.  Perscription has been provided.  He is also to take his oral antibiotics as indicated.  I have set up an appointment with his PCP this Tuesday at 1030 am.  Pt is doing well now and denies any fever, chills, nausea, abdominal discomfort.  Consults: cardiology, General surgery  Significant Diagnostic Studies: labs Initially WBC of 29.3 and today is 9.7.  Initial sodium of 123 today is 131. And potassium level today is 3.4  Treatments: antibiotics: Pt was on cleocin and zosyn IV, Will be switched to cipro on D/C (discussed with surgery), Pt will also be on long acting diltiazem for a fib, I will also provide some oral potassium supplement.  Pt is to get this rechecked at his pcp's office on Tuesday  03/25/11 .  Discharge Exam: Blood pressure 124/72, pulse 92, temperature 98.1 F (36.7 C), temperature source Oral, resp. rate 17, height 5\' 10"  (1.778 m), weight 130.4 kg (287 lb 7.7 oz), SpO2 93.00%. General appearance: alert and cooperative Head: Normocephalic, without obvious abnormality, atraumatic Eyes: conjunctivae/corneas clear. PERRL, EOM's intact. Fundi benign. Ears: normal TM's and external ear canals both ears Nose: Nares normal. Septum midline. Mucosa normal. No drainage or sinus tenderness. Resp: clear to auscultation bilaterally Chest wall: no tenderness Breasts: normal appearance, no masses or tenderness Cardio: regular rate and rhythm, S1, S2 normal, no murmur, click, rub or gallop GI: Pt has dressing over incisions.  + Bowel sounds, tenderness over incision sited.  No obvious erythema or cellulitis Male genitalia: normal Extremities: extremities normal, atraumatic, no cyanosis or edema Pulses: 2+ and symmetric Skin: Skin color, texture, turgor normal. No rashes or lesions Neurologic: Grossly normal  Disposition: Home-Health Care Svc  Discharge Orders    Future Orders Please Complete By Expires   Diet - low sodium heart healthy      Increase activity slowly      Discharge instructions      Comments:   Pt is to go tomorrow and get his cbc checked.  He should take all of his oral antibiotics as indicated.  He is not to drive while on opiod medication and should follow all medication instructions and warnings.    He has an appointment on Tuesday with his PCP.  Patient is to get his potassium and sodium levels checked out at that point.  Driving Restrictions      Comments:   No driving while on opiods   Other Restrictions      Comments:   Please see surgeries recommendations   Call MD for:  temperature >100.4      Call MD for:  persistant nausea and vomiting        Current Discharge Medication List    START taking these medications   Details  diltiazem  (CARDIZEM CD) 240 MG 24 hr capsule Take 1 capsule (240 mg total) by mouth daily. Qty: 30 capsule, Refills: 1    potassium chloride SA (K-DUR,KLOR-CON) 20 MEQ tablet Take 1 tablet (20 mEq total) by mouth daily. Qty: 4 tablet, Refills: 0      CONTINUE these medications which have CHANGED   Details  rivaroxaban 20 MG TABS Take 20 mg by mouth daily with breakfast. Qty: 20 tablet, Refills: 1      CONTINUE these medications which have NOT CHANGED   Details  atorvastatin (LIPITOR) 40 MG tablet Take 40 mg by mouth at bedtime.     cetirizine (ZYRTEC) 10 MG tablet Take 10 mg by mouth daily.     lisinopril-hydrochlorothiazide (PRINZIDE,ZESTORETIC) 10-12.5 MG per tablet Take 1 tablet by mouth every morning.     Methylcellulose, Laxative, (CITRUCEL) 500 MG TABS Take 1 tablet by mouth daily.      omeprazole (PRILOSEC) 20 MG capsule Take 20 mg by mouth daily.     polyethylene glycol (MIRALAX) packet Take 17 g by mouth daily as needed. constipation    senna-docusate (SENNA PLUS) 8.6-50 MG per tablet Take 1 tablet by mouth 2 (two) times daily as needed. Constipation.  Pt takes one every day but sometimes takes another dose.        STOP taking these medications     HYDROmorphone (DILAUDID) 2 MG tablet      methocarbamol (ROBAXIN) 500 MG tablet      morphine (MSIR) 15 MG tablet        Follow-up Information    Follow up with [facility] on 04/04/2011. (3:10pm, arrive at 2:55pm)    Contact information:   [phone] [address]         Signed: Penny Pia 03/21/2011, 12:03 PM

## 2011-03-21 NOTE — Progress Notes (Signed)
Subjective: Pt mentions that he feels better today.  He denies any fever, chills, palpitations, or abdominal discomfort.  Is asking to go home today. Objective: Filed Vitals:   03/21/11 0000 03/21/11 0400 03/21/11 0637 03/21/11 0800  BP:  109/66 128/75 124/72  Pulse:  76 92   Temp: 97.7 F (36.5 C) 97.9 F (36.6 C)  98.1 F (36.7 C)  TempSrc: Oral Oral  Oral  Resp:  17    Height:      Weight:  130.4 kg (287 lb 7.7 oz)    SpO2:  93%     Weight change:   Intake/Output Summary (Last 24 hours) at 03/21/11 1130 Last data filed at 03/21/11 0900  Gross per 24 hour  Intake   1150 ml  Output    616 ml  Net    534 ml    General: Alert, awake, oriented x3, in no acute distress.  HEENT: No bruits, no goiter.  Heart: Regular rate and rhythm, without murmurs, rubs, gallops.  Lungs:Clear to auscultation BL Abdomen: Soft,nondistended, positive bowel sounds. Neuro: Grossly intact, nonfocal.   Lab Results:  Basename 03/21/11 0550 03/20/11 0318 03/19/11 1330  NA 131* 131* --  K 3.4* 3.1* --  CL 101 99 --  CO2 24 23 --  GLUCOSE 109* 108* --  BUN 17 16 --  CREATININE 0.88 0.79 --  CALCIUM 7.8* 7.7* --  MG -- -- 2.2  PHOS -- -- --   No results found for this basename: AST:2,ALT:2,ALKPHOS:2,BILITOT:2,PROT:2,ALBUMIN:2 in the last 72 hours No results found for this basename: LIPASE:2,AMYLASE:2 in the last 72 hours  Basename 03/21/11 0550 03/20/11 0318  WBC 9.7 13.3*  NEUTROABS -- --  HGB 9.1* 9.2*  HCT 27.7* 27.7*  MCV 91.7 89.9  PLT 478* 499*   No results found for this basename: CKTOTAL:3,CKMB:3,CKMBINDEX:3,TROPONINI:3 in the last 72 hours No results found for this basename: POCBNP:3 in the last 72 hours No results found for this basename: DDIMER:2 in the last 72 hours No results found for this basename: HGBA1C:2 in the last 72 hours No results found for this basename: CHOL:2,HDL:2,LDLCALC:2,TRIG:2,CHOLHDL:2,LDLDIRECT:2 in the last 72 hours No results found for this  basename: TSH,T4TOTAL,FREET3,T3FREE,THYROIDAB in the last 72 hours No results found for this basename: VITAMINB12:2,FOLATE:2,FERRITIN:2,TIBC:2,IRON:2,RETICCTPCT:2 in the last 72 hours  Micro Results: No results found for this or any previous visit (from the past 240 hour(s)).  Studies/Results: No results found.  Medications: I have reviewed the patient's current medications.   Patient Active Hospital Problem List: Acute cholecystitis (03/12/2011) Pt is s/p cholecystectomy.  His jp drain was removed yesterday.  WBC count is normal today.  Pt isn't complaining of any abdominal discomfort.  Pt is cleared to go home from a surgical standpoint.  Spoke with Dr. Donell Beers and Barnetta Chapel regarding disposition.  After our discussion we have decided that patient is medically and surgically able to go home. A prescription for cipro will be provided by Barnetta Chapel and patient is to go and get his cbc checked tomorrow.  Patient and wife are aware of the plan and have verbalized understanding.  CORONARY ATHEROSCLEROSIS NATIVE CORONARY ARTERY (03/20/2010)   Stable  HTN (hypertension) (03/12/2011) Well controlled on current drug regimen  Hypercholesteremia (03/12/2011)   Stable no changes  Chronic knee pain (03/12/2011)  Pt is to continue home regimen  Hyponatremia (03/12/2011)   Pt is mildly hyponatremic.  He will start a regular diet and this should normalize.  Pt has an appointment to see his PCP this  Tuesday at 1030 Am  Anemia (03/12/2011)   Stable no changes.  Tomorrow patient will get cbc checked at lab.  Prescription has been provided Atrial fibrillation (03/17/2011)   Cards is managing and have recommended long acting diltiazem Hypokalemia (03/19/2011)  Improved from yesterday.  Will have patient f/u with PCP and have his potassium levels checked.     LOS: 9 days   Penny Pia M.D.  [facility] 03/21/2011, 11:30 AM

## 2011-03-21 NOTE — Progress Notes (Signed)
Patient ID: Steven [name], male   DOB: 04/20/1940, 70 y.o.   MRN: [id number] 5 Days Post-Op  Subjective: Pt feels much better today.  Had a BM.  Tolerating diet.  Wants to go home.  Objective: Vital signs in last 24 hours: Temp:  [97.2 F (36.2 C)-98.1 F (36.7 C)] 98.1 F (36.7 C) (12/07 0800) Pulse Rate:  [76-94] 92  (12/07 0637) Resp:  [17-20] 17  (12/07 0400) BP: (98-128)/(63-75) 124/72 mmHg (12/07 0800) SpO2:  [93 %-95 %] 93 % (12/07 0400) Weight:  [287 lb 7.7 oz (130.4 kg)] 287 lb 7.7 oz (130.4 kg) (12/07 0400) Last BM Date: 03/20/11  Intake/Output from previous day: 12/06 0701 - 12/07 0700 In: 1350 [I.V.:1050; IV Piggyback:300] Out: 916 [Urine:900; Drains:15; Stool:1] Intake/Output this shift: Total I/O In: -  Out: 1 [Stool:1]  PE: Abd: soft, minimally tender, incisions are all c/d/i.  Some leakage from where his JP was, but that's expected.  Lab Results:   [facility] 03/21/11 0550 03/20/11 0318  WBC 9.7 13.3*  HGB 9.1* 9.2*  HCT 27.7* 27.7*  PLT 478* 499*   BMET  Basename 03/21/11 0550 03/20/11 0318  NA 131* 131*  K 3.4* 3.1*  CL 101 99  CO2 24 23  GLUCOSE 109* 108*  BUN 17 16  CREATININE 0.88 0.79  CALCIUM 7.8* 7.7*   PT/INR No results found for this basename: LABPROT:2,INR:2 in the last 72 hours   Studies/Results: No results found.  Anti-infectives: Anti-infectives     Start     Dose/Rate Route Frequency Ordered Stop   03/17/11 1200   clindamycin (CLEOCIN) IVPB 300 mg  Status:  Discontinued        300 mg 100 mL/hr over 30 Minutes Intravenous 4 times per day 03/17/11 1125 03/20/11 1752   03/17/11 0300   vancomycin (VANCOCIN) 1,250 mg in sodium chloride 0.9 % 250 mL IVPB  Status:  Discontinued        1,250 mg 166.7 mL/hr over 90 Minutes Intravenous Every 12 hours 03/16/11 1426 03/18/11 1357   03/16/11 1500   vancomycin (VANCOCIN) 2,000 mg in sodium chloride 0.9 % 500 mL IVPB        2,000 mg 250 mL/hr over 120 Minutes Intravenous  Once 03/16/11  1426 03/16/11 2015   03/12/11 0900  piperacillin-tazobactam (ZOSYN) IVPB 3.375 g       3.375 g 12.5 mL/hr over 240 Minutes Intravenous Every 8 hours 03/12/11 0741     03/12/11 0545   Ampicillin-Sulbactam (UNASYN) 3 g in sodium chloride 0.9 % 100 mL IVPB        3 g 100 mL/hr over 60 Minutes Intravenous  Once 03/12/11 0541 03/12/11 0653           Assessment/Plan  1. S/p lap chole 2. Incarcerated umbilical hernia, s/p repair  Plan:  Ok to d/c home from our standpoint Cipro for 5 additional days should be sufficient Will have him RTC in 2 weeks for follow up   LOS: 9 days    Steven [name] E 03/21/2011

## 2011-03-21 NOTE — Progress Notes (Signed)
Seen, agree with above.  Home per primary team. OK with [place] to go home today.

## 2011-03-21 NOTE — Progress Notes (Signed)
Subjective:  Patient went into atrial fibrillation on 03/14/11 following TKR. Developed SBO following cholecystectomy requiring repeat Abdominal surgery. Now converted to NSR!Marland Kitchen Eating solids. Ambulating in the [place].  Objective:  Vital Signs in the last 24 hours: BP 128/75  Pulse 92  Temp(Src) 97.9 F (36.6 C) (Oral)  Resp 17  Ht 5\' 10"  (1.778 m)  Wt 130.4 kg (287 lb 7.7 oz)  BMI 41.25 kg/m2  SpO2 93%  Physical Exam:  Pleasant obese male currently in no acute distress.  Lungs: Clear anteriorly. Decreased BS at bases. Cardiac: Regular rhythm, normal S1-S2, No murmur or S3. Abdomen mildly distended. Drain in RUQ.  Extremities: Scars from previous knee replacement on the right knee. No edema  Intake/Output from previous day: 12/06 0701 - 12/07 0700 In: 1350 [I.V.:1050; IV Piggyback:300] Out: 916 [Urine:900; Drains:15; Stool:1]  Lab Results: Basic Metabolic Panel:  Basename 03/21/11 0550 03/20/11 0318  NA 131* 131*  K 3.4* 3.1*  CL 101 99  CO2 24 23  GLUCOSE 109* 108*  BUN 17 16  CREATININE 0.88 0.79    CBC:  Basename 03/21/11 0550 03/20/11 0318  WBC 9.7 13.3*  NEUTROABS -- --  HGB 9.1* 9.2*  HCT 27.7* 27.7*  MCV 91.7 89.9  PLT 478* 499*   TSH is normal.  Telemetry: NSR Rate 90 bpm.  Echo shows mild LVH with normal systolic function EF 65-70%. Mild LAE.  Assessment/Plan:  1. Postoperative atrial fibrillation.  Now converted to NSR. 2. Coronary artery disease stable 3. Hyponatremia improved. 4. SBO s/p choly 5. S/p TKR 6. Hypokalemia.  Recommendations:  Switch Diltiazem to long acting formulation. Continue Xarelto. Patient anxious to go home. Stable from cardiac standpoint.   Aara Jacquot [place] MD  03/21/2011, 7:27 AM

## 2011-03-21 NOTE — Progress Notes (Signed)
Physical Therapy Treatment/Discharge Note Patient Details Name: Steven [name] MRN: [id number] DOB: [birth year] Today's Date: 03/21/2011 [id number] 1GT,1TE  PT Assessment/Plan  PT - Assessment/Plan Comments on Treatment Session: Patient tolerating treatment with HR much improved after converting to Sinus rhthym.  Ready for d/c home.  No need to practice steps due to patient requesting to [employer] for trip home. PT Plan: Discharge plan remains appropriate PT Frequency:  (d/c home today) Follow Up Recommendations: Home health PT Equipment Recommended: None recommended by PT PT Goals  Acute Rehab PT Goals PT Goal: Supine/Side to Sit - Progress: Partly met PT Goal: Sit to Stand - Progress: Met PT Goal: Stand to Sit - Progress: Met PT Goal: Ambulate - Progress: Partly met PT Goal: Up/Down Stairs - Progress: Not met PT Goal: Perform Home Exercise Program - Progress: Partly met  PT Treatment Precautions/Restrictions  Precautions Precautions: Knee Precaution Comments: no pillow under knee at rest Restrictions Weight Bearing Restrictions: Yes RLE Weight Bearing: Weight bearing as tolerated Other Position/Activity Restrictions: pt able to perform independent SLR ; placed in KI for positioning in knee extension; Mobility (including Balance) Bed Mobility Supine to Sit: 5: Supervision Transfers Sit to Stand: 6: Modified independent (Device/Increase time);From bed Stand to Sit: 6: Modified independent (Device/Increase time);To chair/3-in-1 Ambulation/Gait Ambulation/Gait Assistance: 4: Min assist Ambulation/Gait Assistance Details (indicate cue type and reason): miniguard assist Ambulation Distance (Feet): 200 Feet Assistive device: Rolling walker    Exercise  Total Joint Exercises Long Arc Quad: AROM;10 reps;Seated;Right Knee Flexion: AAROM;Right;Seated;15 reps (pt assisting with left) End of Session PT - End of Session Equipment Utilized During Treatment: Gait belt Activity  Tolerance: Patient tolerated treatment well Patient left: in chair General Behavior During Session: [facility] for tasks performed Cognition: [facility] for tasks performed  [facility] 03/21/2011, 1:36 PM

## 2011-03-22 ENCOUNTER — Other Ambulatory Visit (INDEPENDENT_AMBULATORY_CARE_PROVIDER_SITE_OTHER): Payer: Self-pay | Admitting: General Surgery

## 2011-03-22 LAB — BASIC METABOLIC PANEL
BUN: 15 mg/dL (ref 6–23)
CO2: 22 mEq/L (ref 19–32)
Calcium: 7.9 mg/dL — ABNORMAL LOW (ref 8.4–10.5)
Creat: 0.72 mg/dL (ref 0.50–1.35)
Glucose, Bld: 99 mg/dL (ref 70–99)

## 2011-03-22 LAB — CBC WITH DIFFERENTIAL/PLATELET
Eosinophils Absolute: 0.2 10*3/uL (ref 0.0–0.7)
Eosinophils Relative: 2 % (ref 0–5)
Hemoglobin: 10 g/dL — ABNORMAL LOW (ref 13.0–17.0)
Lymphs Abs: 0.8 10*3/uL (ref 0.7–4.0)
MCH: 30.7 pg (ref 26.0–34.0)
MCHC: 33 g/dL (ref 30.0–36.0)
MCV: 92.9 fL (ref 78.0–100.0)
Monocytes Relative: 4 % (ref 3–12)
RBC: 3.26 MIL/uL — ABNORMAL LOW (ref 4.22–5.81)

## 2011-03-24 ENCOUNTER — Telehealth (INDEPENDENT_AMBULATORY_CARE_PROVIDER_SITE_OTHER): Payer: Self-pay | Admitting: General Surgery

## 2011-03-24 NOTE — Telephone Encounter (Signed)
Dennie Bible (spouse) called in stating orders were needed for her husband to resume rehabilitation from knee surgery on 03/05/11. She stated rehab was discontinued due to his having gallbladder sx on 03/14/11. Per Dennie Bible Dr. Johna Sheriff performed sx.

## 2011-04-04 ENCOUNTER — Encounter (INDEPENDENT_AMBULATORY_CARE_PROVIDER_SITE_OTHER): Payer: Self-pay | Admitting: Radiology

## 2011-04-04 ENCOUNTER — Ambulatory Visit (INDEPENDENT_AMBULATORY_CARE_PROVIDER_SITE_OTHER): Payer: Medicare Other | Admitting: Cardiovascular Disease

## 2011-04-04 ENCOUNTER — Encounter: Payer: Self-pay | Admitting: Cardiovascular Disease

## 2011-04-04 ENCOUNTER — Ambulatory Visit (INDEPENDENT_AMBULATORY_CARE_PROVIDER_SITE_OTHER): Payer: Medicare Other | Admitting: Radiology

## 2011-04-04 VITALS — BP 126/76 | HR 105 | Ht 70.5 in | Wt 263.0 lb

## 2011-04-04 DIAGNOSIS — K432 Incisional hernia without obstruction or gangrene: Secondary | ICD-10-CM

## 2011-04-04 DIAGNOSIS — I2510 Atherosclerotic heart disease of native coronary artery without angina pectoris: Secondary | ICD-10-CM

## 2011-04-04 DIAGNOSIS — K819 Cholecystitis, unspecified: Secondary | ICD-10-CM

## 2011-04-04 DIAGNOSIS — I4891 Unspecified atrial fibrillation: Secondary | ICD-10-CM

## 2011-04-04 LAB — CBC WITH DIFFERENTIAL/PLATELET
Basophils Absolute: 0 10*3/uL (ref 0.0–0.1)
Lymphocytes Relative: 29 % (ref 12.0–46.0)
Lymphs Abs: 1.6 10*3/uL (ref 0.7–4.0)
Monocytes Relative: 10.1 % (ref 3.0–12.0)
Neutrophils Relative %: 58.9 % (ref 43.0–77.0)
Platelets: 352 10*3/uL (ref 150.0–400.0)
RDW: 15.2 % — ABNORMAL HIGH (ref 11.5–14.6)

## 2011-04-04 NOTE — Patient Instructions (Addendum)
Your physician recommends that you schedule a follow-up appointment in: 4 weeks. Scheduled for May 09, 2011 at 9:00  Your physician has recommended you make the following change in your medication: Stop Xarelto.  You had lab work done today.

## 2011-04-04 NOTE — Progress Notes (Signed)
HPI:  70 year old gentleman presented for followup evaluation. I saw him last in October as part of a preoperative evaluation before knee replacement. He has known coronary artery disease with a stenosis of the distal branch of his right coronary artery. He has undergone medical therapy and has not had any ischemic problems. The patient underwent knee replacement surgery without perioperative problems. However, he developed acute cholecystitis requiring urgent cholecystectomy in November. He developed postoperative atrial fibrillation and was treated with Cardizem for rate control and Xarelto for anticoagulation.  The patient is beginning to feel better. He complains of new rash over the last 3-4 days that has affected his lower legs. It does not itch, nor is it painful. He has not noticed it on any other areas of his body. He has not started any new medications in the last few days. He denies chest pain, shortness of breath, or palpitations.  Outpatient Encounter Prescriptions as of 04/04/2011  Medication Sig Dispense Refill  . atorvastatin (LIPITOR) 40 MG tablet Take 40 mg by mouth at bedtime.       . cetirizine (ZYRTEC) 10 MG tablet Take 10 mg by mouth daily.       Marland Kitchen lisinopril-hydrochlorothiazide (PRINZIDE,ZESTORETIC) 10-12.5 MG per tablet Take 1 tablet by mouth every morning.       . Methylcellulose, Laxative, (CITRUCEL) 500 MG TABS Take 1 tablet by mouth daily.        Marland Kitchen omeprazole (PRILOSEC) 20 MG capsule Take 20 mg by mouth daily.       . polyethylene glycol (MIRALAX) packet Take 17 g by mouth daily as needed. constipation      . potassium chloride SA (K-DUR,KLOR-CON) 20 MEQ tablet Take 1 tablet (20 mEq total) by mouth daily.  4 tablet  0  . senna-docusate (SENNA PLUS) 8.6-50 MG per tablet Take 1 tablet by mouth 2 (two) times daily as needed. Constipation.  Pt takes one every day but sometimes takes another dose.        Marland Kitchen DISCONTD: diltiazem (CARDIZEM CD) 240 MG 24 hr capsule Take 1 capsule  (240 mg total) by mouth daily.  30 capsule  1  . DISCONTD: rivaroxaban 20 MG TABS Take 20 mg by mouth daily with breakfast.  20 tablet  1   Facility-Administered Encounter Medications as of 04/04/2011  Medication Dose Route Frequency Provider Last Rate Last Dose  . chlorhexidine (HIBICLENS) 4 % liquid 4 application  60 mL Topical Once Homero Fellers V Aluisio      . chlorhexidine (HIBICLENS) 4 % liquid 4 application  60 mL Topical Once Homero Fellers V Aluisio        Allergies  Allergen Reactions  . Ciprofloxacin Rash  . Oxycodone-Acetaminophen Rash and Other (See Comments)    Felt like he was hot    Past Medical History  Diagnosis Date  . Obesity   . Dyslipidemia   . Coronary artery disease 02-27-11    Dr. Marliss Coots follows-has some blocked coronary arteries ,not suitable for stent  placement  . Hypertension   . Shingles outbreak 02-27-11    2 weeks ago , was tx.-only residual is tenderness of right scalp-no open areas  . GERD (gastroesophageal reflux disease) 02-27-11    Acid reflux  . Hemorrhoids 02-27-11    not bothersome at this time  . Hearing loss 02-27-11    Bilateral hearing aids-due to exposure to loud machinery  . Urinary incontinence 02-27-11    not an everday occurrence-no special measures  . Osteoarthritis 02-27-11  Ostearthritis-knees, shoulders.Back causes chronic pain-radiates down right leg  . Chronic pain   . Hyperlipidemia   . Leg swelling   . Rash     ROS: Negative except as per HPI  BP 120/79  Pulse 96  Ht 5' 10.5" (1.791 m)  Wt 119.659 kg (263 lb 12.8 oz)  BMI 37.32 kg/m2  PHYSICAL EXAM: Pt is alert and oriented, overweight male in NAD HEENT: normal Neck: JVP - normal, carotids 2+= without bruits Lungs: CTA bilaterally CV: RRR without murmur or gallop Abd: soft, NT, Positive BS, no hepatomegaly Ext: no C/C/E, distal pulses intact and equal Skin: there is a petechial rash present bilaterally in the lower legs.  EKG:  Normal sinus rhythm 91  beats per minute, within normal limits.  ASSESSMENT AND PLAN:

## 2011-04-04 NOTE — Progress Notes (Signed)
Steven [name] 12-13-1940 [id number] 04/04/2011   Steven [name] is a 70 y.o. male who had a laparoscopic cholecystectomy with intraoperative cholangiogram by Dr. Johna Sheriff.  The pathology report confirmed cholecystitis.  He unfortunately developed a post-op hernia at the trocar site and had to be take back for repair on POD #3 by Dr. Biagio Quint. The patient reports that they are feeling better. He's had some issues with drug rashes and reactions. He saw his cardiologist today who has taken him off Xarelton and restarted ASA..  The pre-operative symptoms of abdominal pain, nausea, and vomiting have resolved.    Physical examination - Incisions appear well-healed with no sign of infection or bleeding.   Abdomen - soft, non-tender  Impression:  s/p laparoscopic cholecystectomy, then repair trocar site hernia  Plan:  He may resume a regular diet and full activity.  He may follow-up on a PRN basis.

## 2011-04-16 NOTE — Assessment & Plan Note (Signed)
The patient is stable without anginal symptoms. He just went through total knee replacement and urgent cholecystectomy without any ischemic cardiac symptoms. We'll continue his current medical program.

## 2011-04-16 NOTE — Assessment & Plan Note (Signed)
The patient had atrial fibrillation limited to the postoperative period. He is back in sinus rhythm and was noted to be in sinus rhythm at the time of hospital discharge. I am concerned about his petechial rash in his lower legs and I have recommended that he have a CBC done today. He also will stop Xarelto and transition to aspirin 81 mg daily. I would like to see the patient back in one month for followup. If his rash does not begin to resolve within the next few days I recommended that he consult with his primary care physician or with the dermatologist. The patient will remain on low dose diltiazem.

## 2011-05-09 ENCOUNTER — Ambulatory Visit: Payer: Medicare Other | Admitting: Cardiovascular Disease

## 2011-05-28 ENCOUNTER — Encounter: Payer: Self-pay | Admitting: Cardiovascular Disease

## 2011-05-28 ENCOUNTER — Ambulatory Visit (INDEPENDENT_AMBULATORY_CARE_PROVIDER_SITE_OTHER): Payer: Medicare Other | Admitting: Cardiovascular Disease

## 2011-05-28 VITALS — BP 108/80 | HR 66 | Ht 70.5 in | Wt 272.1 lb

## 2011-05-28 DIAGNOSIS — I2510 Atherosclerotic heart disease of native coronary artery without angina pectoris: Secondary | ICD-10-CM

## 2011-05-28 DIAGNOSIS — I4891 Unspecified atrial fibrillation: Secondary | ICD-10-CM

## 2011-05-28 NOTE — Progress Notes (Signed)
HPI:  This is a 71 year old gentleman presenting for followup evaluation. The patient has coronary artery disease and has been managed medically without ischemic problems. He was hospitalized with acute cholecystitis in November. He required cholecystectomy as well as a second surgery to treat an incisional hernia. The patient had postoperative atrial fibrillation. He was treated with rate control and anticoagulation, with spontaneous conversion back to sinus rhythm. His anticoagulation was discontinued because of a drug rash. He has been maintained on aspirin 81 mg. He has been in sinus rhythm during his office followup visits. The patient complains of generalized fatigue. He denies palpitations, chest pain, exertional dyspnea, lightheadedness, or syncope. He also complains of mild leg swelling.  Outpatient Encounter Prescriptions as of 05/28/2011  Medication Sig Dispense Refill  . aspirin 81 MG tablet Take 81 mg by mouth daily.      Marland Kitchen atorvastatin (LIPITOR) 40 MG tablet Take 40 mg by mouth at bedtime.       . cholecalciferol (VITAMIN D) 1000 UNITS tablet Take 1,000 Units by mouth daily.      Marland Kitchen diltiazem (CARDIZEM CD) 240 MG 24 hr capsule Take 120 mg by mouth daily.        . ferrous sulfate 325 (65 FE) MG tablet Take 325 mg by mouth daily with breakfast.      . fish oil-omega-3 fatty acids 1000 MG capsule Take 2 g by mouth daily.      Marland Kitchen HYDROmorphone HCl 32 MG TB24 Take 40 mg by mouth daily.      Marland Kitchen lisinopril-hydrochlorothiazide (PRINZIDE,ZESTORETIC) 10-12.5 MG per tablet Take 1 tablet by mouth every morning.       . Methylcellulose, Laxative, (CITRUCEL) 500 MG TABS Take 1 tablet by mouth daily.        Marland Kitchen omeprazole (PRILOSEC) 20 MG capsule Take 20 mg by mouth daily.       . polyethylene glycol (MIRALAX) packet Take 17 g by mouth daily as needed. constipation      . senna-docusate (SENNA PLUS) 8.6-50 MG per tablet Take 1 tablet by mouth 2 (two) times daily as needed. Constipation.  Pt takes one every  day but sometimes takes another dose.        . zolpidem (AMBIEN) 5 MG tablet Take 5 mg by mouth at bedtime as needed.       . cetirizine (ZYRTEC) 10 MG tablet Take 10 mg by mouth daily.       Marland Kitchen DISCONTD: morphine (MSIR) 15 MG tablet       . DISCONTD: potassium chloride SA (K-DUR,KLOR-CON) 20 MEQ tablet Take 1 tablet (20 mEq total) by mouth daily.  4 tablet  0   Facility-Administered Encounter Medications as of 05/28/2011  Medication Dose Route Frequency Provider Last Rate Last Dose  . chlorhexidine (HIBICLENS) 4 % liquid 4 application  60 mL Topical Once Loanne Drilling, MD      . chlorhexidine (HIBICLENS) 4 % liquid 4 application  60 mL Topical Once Loanne Drilling, MD        Allergies  Allergen Reactions  . Ciprofloxacin Rash  . Oxycodone-Acetaminophen Rash and Other (See Comments)    Felt like he was hot    Past Medical History  Diagnosis Date  . Obesity   . Dyslipidemia   . Coronary artery disease 02-27-11    Dr. Marliss Coots follows-has some blocked coronary arteries ,not suitable for stent  placement  . Hypertension   . Shingles outbreak 02-27-11    2 weeks ago , was  tx.-only residual is tenderness of right scalp-no open areas  . GERD (gastroesophageal reflux disease) 02-27-11    Acid reflux  . Hemorrhoids 02-27-11    not bothersome at this time  . Hearing loss 02-27-11    Bilateral hearing aids-due to exposure to loud machinery  . Urinary incontinence 02-27-11    not an everday occurrence-no special measures  . Osteoarthritis 02-27-11    Ostearthritis-knees, shoulders.Back causes chronic pain-radiates down right leg  . Chronic pain   . Hyperlipidemia   . Leg swelling   . Rash     ROS: Negative except as per HPI  BP 108/80  Pulse 66  Ht 5' 10.5" (1.791 m)  Wt 123.433 kg (272 lb 1.9 oz)  BMI 38.49 kg/m2  PHYSICAL EXAM: Pt is alert and oriented, very pleasant obese male in NAD HEENT: normal Neck: JVP - normal, carotids 2+= without bruits Lungs: CTA  bilaterally CV: RRR without murmur or gallop Abd: soft, NT, Positive BS, no hepatomegaly Ext: no C/C/E, distal pulses intact and equal Skin: warm/dry no rash  EKG:  Normal sinus rhythm 66 beats per minute, cannot rule out anterior infarct age undetermined.  ASSESSMENT AND PLAN:

## 2011-05-28 NOTE — Patient Instructions (Signed)
Your physician has recommended you make the following change in your medication: STOP Diltiazem  Your physician wants you to follow-up in: 6 MONTHS.  You will receive a reminder letter in the mail two months in advance. If you don't receive a letter, please call our office to schedule the follow-up appointment.

## 2011-05-28 NOTE — Assessment & Plan Note (Signed)
The patient is stable without angina. Continue current medical therapy. He is on appropriate meds with aspirin, a statin drug, and an ACE inhibitor.

## 2011-05-28 NOTE — Assessment & Plan Note (Signed)
The patient's atrial fibrillation was limited to the postoperative period. He is maintaining sinus rhythm on followup. He complains of generalized fatigue and his blood pressure is in the low-normal range. I recommended discontinuing diltiazem. He will continue on lisinopril/hydrochlorothiazide. He will continue on aspirin 81 mg daily. I would like to see him back in followup in 6 months.

## 2011-12-25 ENCOUNTER — Encounter: Payer: Self-pay | Admitting: Cardiovascular Disease

## 2011-12-25 ENCOUNTER — Ambulatory Visit (INDEPENDENT_AMBULATORY_CARE_PROVIDER_SITE_OTHER): Payer: Medicare Other | Admitting: Cardiovascular Disease

## 2011-12-25 VITALS — BP 121/87 | HR 59 | Ht 71.0 in | Wt 280.0 lb

## 2011-12-25 DIAGNOSIS — I2510 Atherosclerotic heart disease of native coronary artery without angina pectoris: Secondary | ICD-10-CM

## 2011-12-25 DIAGNOSIS — I4891 Unspecified atrial fibrillation: Secondary | ICD-10-CM

## 2011-12-25 NOTE — Progress Notes (Signed)
HPI:  71 year old gentleman presenting for followup evaluation. The patient has coronary artery disease and he has been managed medically. He has a history of postoperative atrial fibrillation after he required emergency cholecystectomy and incisional hernia surgery. This occurred last year he has not had atrial fibrillation since he was in the hospital. He had a diffuse petechial rash when he was on full anticoagulation and he has been changed back to antiplatelet therapy with aspirin.  Overall the patient is doing well from a cardiac perspective. He is having a lot of knee problems and is scheduled to undergo a total knee replacement of the left knee. He presents today for preoperative evaluation. He and his wife have a lot of concerns about statin side effects. We had a long discussion about this today. He is having some neck and shoulder pain. He is not having thigh pain or weakness. He's had back and knee troubles but I advised her not related to his statin drug and rather they are to due to advanced osteoarthritis.  From a cardiac perspective, the patient is doing well. He denies chest pain or pressure, dyspnea, or palpitations. Despite his knee pain, he can do greater than 4 METs of activity without symptoms. He does yard work and other physical activities.  Outpatient Encounter Prescriptions as of 12/25/2011  Medication Sig Dispense Refill  . atorvastatin (LIPITOR) 40 MG tablet Take 40 mg by mouth at bedtime.       . cetirizine (ZYRTEC) 10 MG tablet Take 10 mg by mouth as needed.       . cholecalciferol (VITAMIN D) 1000 UNITS tablet Take 1,000 Units by mouth daily.      . fish oil-omega-3 fatty acids 1000 MG capsule Take 2 g by mouth daily.      Marland Kitchen HYDROmorphone HCl 32 MG TB24 Take 40 mg by mouth daily. As needed      . lisinopril-hydrochlorothiazide (PRINZIDE,ZESTORETIC) 10-12.5 MG per tablet Take 1 tablet by mouth every morning.       . Methylcellulose, Laxative, (CITRUCEL) 500 MG TABS Take  1 tablet by mouth daily.        Marland Kitchen omeprazole (PRILOSEC) 20 MG capsule Take 20 mg by mouth daily.       . polyethylene glycol (MIRALAX) packet Take 17 g by mouth daily as needed. constipation      . senna-docusate (SENNA PLUS) 8.6-50 MG per tablet Take 1 tablet by mouth 2 (two) times daily as needed. Constipation.  Pt takes one every day but sometimes takes another dose.        Marland Kitchen aspirin 81 MG tablet Take 81 mg by mouth daily.      Marland Kitchen DISCONTD: ferrous sulfate 325 (65 FE) MG tablet Take 325 mg by mouth daily with breakfast.      . DISCONTD: zolpidem (AMBIEN) 5 MG tablet Take 5 mg by mouth at bedtime as needed.        Facility-Administered Encounter Medications as of 12/25/2011  Medication Dose Route Frequency Provider Last Rate Last Dose  . chlorhexidine (HIBICLENS) 4 % liquid 4 application  60 mL Topical Once Loanne Drilling, MD      . chlorhexidine (HIBICLENS) 4 % liquid 4 application  60 mL Topical Once Loanne Drilling, MD        Allergies  Allergen Reactions  . Ciprofloxacin Rash  . Oxycodone-Acetaminophen Rash and Other (See Comments)    Felt like he was hot    Past Medical History  Diagnosis Date  .  Obesity   . Dyslipidemia   . Coronary artery disease 02-27-11    Dr. Marliss Coots follows-has some blocked coronary arteries ,not suitable for stent  placement  . Hypertension   . Shingles outbreak 02-27-11    2 weeks ago , was tx.-only residual is tenderness of right scalp-no open areas  . GERD (gastroesophageal reflux disease) 02-27-11    Acid reflux  . Hemorrhoids 02-27-11    not bothersome at this time  . Hearing loss 02-27-11    Bilateral hearing aids-due to exposure to loud machinery  . Urinary incontinence 02-27-11    not an everday occurrence-no special measures  . Osteoarthritis 02-27-11    Ostearthritis-knees, shoulders.Back causes chronic pain-radiates down right leg  . Chronic pain   . Hyperlipidemia   . Leg swelling   . Rash     ROS: Negative except as  per HPI  BP 121/87  Pulse 59  Ht 5\' 11"  (1.803 m)  Wt 127.007 kg (280 lb)  BMI 39.05 kg/m2  PHYSICAL EXAM: Pt is alert and oriented, NAD HEENT: normal Neck: JVP - normal, carotids 2+= without bruits Lungs: CTA bilaterally CV: RRR without murmur or gallop Abd: soft, NT, Positive BS, no hepatomegaly Ext: no C/C/E, distal pulses intact and equal Skin: warm/dry no rash  EKG:  Sinus rhythm 59 beats per minute, first degree AV block   ASSESSMENT AND PLAN: 1. Native vessel coronary artery disease. The patient has no angina. He has done well with medical therapy. Recommend continue the same. He is at low risk of cardiac complications from his upcoming knee surgery. He can proceed without further testing.  2. Paroxysmal atrial fibrillation. The patient remains in sinus rhythm. I think it is most likely potential perioperative cardiac problem is atrial fibrillation. I would recommend monitoring him in a telemetry bed at least for the first 24 hours after surgery.  3. Hyperlipidemia. Consider statin holiday in the future. I think it would be best for him to remain on a statin drug until his surgery is completed. We will revisit this when he returns for followup. His lipids are followed by his primary care physician.  Tonny Bollman 12/25/2011 10:52 AM

## 2011-12-25 NOTE — Patient Instructions (Signed)
Your physician wants you to follow-up in: 6 MONTHS with Dr Cooper.  You will receive a reminder letter in the mail two months in advance. If you don't receive a letter, please call our office to schedule the follow-up appointment.  Your physician recommends that you continue on your current medications as directed. Please refer to the Current Medication list given to you today.  

## 2012-01-15 ENCOUNTER — Other Ambulatory Visit: Payer: Self-pay | Admitting: Orthopedic Surgery

## 2012-01-15 MED ORDER — BUPIVACAINE 0.25 % ON-Q PUMP SINGLE CATH 300ML: 300.0000 mL | INJECTION | Status: DC

## 2012-01-15 MED ORDER — DEXAMETHASONE SODIUM PHOSPHATE 10 MG/ML IJ SOLN: 10.0000 mg | Freq: Once | INTRAMUSCULAR | Status: DC

## 2012-01-15 NOTE — Progress Notes (Signed)
Preoperative surgical orders have been place into the [facility] hospital system for Steven [name] on 01/15/2012, 2:12 PM  by Patrica Duel for surgery on 03/01/2012.  Preop Total Knee orders including Bupivacaine On-Q pump, IV Tylenol, and IV Decadron as long as there are no contraindications to the above medications. Avel Peace, PA-C

## 2012-02-20 ENCOUNTER — Encounter (HOSPITAL_COMMUNITY): Payer: Self-pay

## 2012-02-23 NOTE — Patient Instructions (Signed)
[name]  02/23/2012   Your procedure is scheduled on: 03/01/12 [id number]  Report to [facility] at 0515 AM.  Call this number if you have problems the morning of surgery: [phone]   Remember:   Do not eat food:After Midnight.  May have clear liquids:until Midnight .  Marland Kitchen  Take these medicines the morning of surgery with A SIP OF WATER:    Do not wear jewelry,   Do not wear lotions, powders, or perfumes. .  . Men may shave face and neck.  Do not bring valuables to the hospital.  Contacts, dentures or bridgework may not be worn into surgery.  Leave suitcase in the car. After surgery it may be brought to your room.  For patients admitted to the hospital, checkout time is 11:00 AM the day of discharge.  .             SEE CHG INSTRUCTION SHEET    Please read over the following fact sheets that you were given: MRSA Information, Blood transfusion Fact sheet, incentive spirometry fact sheet, coughing and deep breathing exercises

## 2012-02-24 ENCOUNTER — Encounter (HOSPITAL_COMMUNITY)
Admission: RE | Admit: 2012-02-24 | Discharge: 2012-02-24 | Disposition: A | Discharge status: Home / Self Care | Payer: Medicare Other | Source: Ambulatory Visit | Attending: Orthopedic Surgery | Admitting: Orthopedic Surgery

## 2012-02-24 ENCOUNTER — Ambulatory Visit (HOSPITAL_COMMUNITY)
Admission: RE | Admit: 2012-02-24 | Discharge: 2012-02-24 | Disposition: A | Discharge status: Home / Self Care | Payer: Medicare Other | Source: Ambulatory Visit | Attending: Orthopedic Surgery | Admitting: Orthopedic Surgery

## 2012-02-24 ENCOUNTER — Encounter (HOSPITAL_COMMUNITY): Payer: Self-pay

## 2012-02-24 DIAGNOSIS — Z01811 Encounter for preprocedural respiratory examination: Secondary | ICD-10-CM | POA: Insufficient documentation

## 2012-02-24 DIAGNOSIS — I10 Essential (primary) hypertension: Secondary | ICD-10-CM | POA: Insufficient documentation

## 2012-02-24 DIAGNOSIS — Z87891 Personal history of nicotine dependence: Secondary | ICD-10-CM | POA: Insufficient documentation

## 2012-02-24 DIAGNOSIS — R0602 Shortness of breath: Secondary | ICD-10-CM | POA: Insufficient documentation

## 2012-02-24 HISTORY — DX: Shortness of breath: R06.02

## 2012-02-24 HISTORY — DX: Cardiac arrhythmia, unspecified: I49.9

## 2012-02-24 HISTORY — DX: Other complications of anesthesia, initial encounter: T88.59XA

## 2012-02-24 HISTORY — DX: Adverse effect of unspecified anesthetic, initial encounter: T41.45XA

## 2012-02-24 LAB — COMPREHENSIVE METABOLIC PANEL
BUN: 21 mg/dL (ref 6–23)
Calcium: 9.5 mg/dL (ref 8.4–10.5)
Creatinine, Ser: 0.95 mg/dL (ref 0.50–1.35)
GFR calc Af Amer: >90 mL/min (ref >90)
Glucose, Bld: 104 mg/dL — ABNORMAL HIGH (ref 70–99)
Sodium: 135 mEq/L (ref 135–145)
Total Protein: 6.9 g/dL (ref 6.0–8.3)

## 2012-02-24 LAB — URINALYSIS, ROUTINE W REFLEX MICROSCOPIC
Ketones, ur: NEGATIVE mg/dL
Leukocytes, UA: NEGATIVE
Protein, ur: NEGATIVE mg/dL
Urobilinogen, UA: 0.2 mg/dL (ref 0.0–1.0)

## 2012-02-24 LAB — CBC
HCT: 42.4 % (ref 39.0–52.0)
Hemoglobin: 14.7 g/dL (ref 13.0–17.0)
MCH: 31.3 pg (ref 26.0–34.0)
MCHC: 34.7 g/dL (ref 30.0–36.0)
MCV: 90.2 fL (ref 78.0–100.0)

## 2012-02-24 LAB — SURGICAL PCR SCREEN: MRSA, PCR: NEGATIVE

## 2012-02-24 LAB — PROTIME-INR: Prothrombin Time: 12.2 seconds (ref 11.6–15.2)

## 2012-02-24 NOTE — Progress Notes (Signed)
Your patient has screened at an elevated risk for Obstructive Sleep Apnea using the STOP-Bang Tool during a presurgical visit.  A score of 4 or greater is considered an elevated risk.   

## 2012-02-24 NOTE — Progress Notes (Signed)
Last office visit with DR Excell Seltzer ( cardiologist) 01/03/12.

## 2012-02-29 ENCOUNTER — Other Ambulatory Visit: Payer: Self-pay | Admitting: Orthopedic Surgery

## 2012-02-29 NOTE — H&P (Signed)
Steven [name]  DOB: 03/11/1941 Married / Language: English / Race: White Male  Date of Admission:  03/01/12  Chief Complaint:  Left Knee Pain  History of Present Illness The patient is a 71 year old male who comes in for a preoperative History and Physical. The patient is scheduled for a left total knee arthroplasty to be performed by Dr. Frank V. Aluisio, MD at [facility] on 03/01/2012. The patient is a 71 year old male who presents today for follow up of their knee. The patient is being followed for their left knee pain and osteoarthritis. They are now 3 month(s) (from last injection) out. Symptoms reported today include: pain. 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 He has pain with weightbearing, instability, and swelling. He says that if he sits a long time he has a lot of pain and stiffness as well. He had a cortisone injection in the right knee in April but only had relief for one month. He had Synvisc in the right knee before it was replaced and reports that it worked well. The left knee is getting progressively worse on him. He is at a stage where the left knee is giving him the most trouble. Right knee has gotten a lot better. He is ready to get the left knee fixed. They have been treated conservatively in the past for the above stated problem and despite conservative measures, they continue to have progressive pain and severe functional limitations and dysfunction. They have failed non-operative management including home exercise, medications, and injections. It is felt that they would benefit from undergoing total joint replacement. Risks and benefits of the procedure have been discussed with the patient and they elect to proceed with surgery. There are no active contraindications to surgery such as ongoing infection or rapidly progressive neurological disease.  Allergies Cipro *Fluoroquinolones PERCOCET.  11/02/2009   Family History Chronic Obstructive Lung Disease. father Congestive Heart Failure. mother mother, sister and grandfather mothers side Osteoarthritis. mother and father Rheumatoid Arthritis. mother and father Heart disease in male family member before age 65 Hypertension. mother Cancer. grandmother mothers side and grandmother fathers side father, grandmother mothers side and grandmother fathers side Heart Disease. father, sister and grandfather mothers side Osteoporosis. mother and father   Social History Pain Contract. yes no Tobacco use. Former smoker. never smoker never smoker; uses 2 or more can(s) smokeless per week Tobacco / smoke exposure. no Advance Directives. Living will, Healthcare power of attorney Post-Surgical Plans. Plan for home Previously in rehab. no Number of flights of stairs before winded. 2-3 Marital status. married Living situation. live with spouse Illicit drug use. 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 never consumed alcohol Drug/Alcohol Rehab (Previously). no No alcohol use Children. 3 Exercise. Exercises weekly; does running / walking Exercises daily; does other Drug/Alcohol Rehab (Currently). no   Medication History Atorvastatin Calcium (40MG Tablet, Oral) Active. ZyrTEC Allergy (10MG Tablet, Oral) Active. Vitamin D (1000UNIT Capsule, Oral) Active. Fish Oil Active. HYDROmorphone HCl (4MG Tablet, Oral) Active. Lisinopril-Hydrochlorothiazide (10-12.5MG Tablet, Oral) Active. Omeprazole (20MG Capsule DR, Oral) Active. Aspirin Childrens (81MG Tablet Chewable, Oral) Active. Citrucel (500MG Tablet, Oral) Active. Senna-Docusate Sodium (8.6-50MG Tablet, Oral) Active.   Past Surgical History Right Eye Laser Surgery. Repair retina Cardia Cath x2 Total Knee Replacement - Right Other Surgery. BLOCKAGE FROM PREVIOUS GALLBLADDER  SURG/HERNIA Gallbladder Surgery. Date: 2012. laporoscopic Right Total   Knee Revision. Date: 2012.   Past Medical History Chronic Pain Impaired Vision. cataracts Gastroesophageal Reflux Disease Coronary artery disease Osteoarthritis Hypercholesterolemia High blood pressure Hemorrhoids Shingles Impaired Hearing. uses hearing aides Cardiac Arrhythmia . Atrial Fib Anemia Urinary Incontinence. mild Osteoarthrosis NOS, lower leg (715.96). 06/04/2005 Lumbago (724.2). 06/24/2005   Review of Systems General:Present- Feeling sick. Not Present- Chills, Fever, Night Sweats, Appetite Loss, Fatigue, Weight Gain and Weight Loss. Skin:Present- Itching and Rash. Not Present- Skin Color Changes, Ulcer, Psoriasis and Change in Hair or Nails. HEENT:Present- Blurred Vision and Hearing Loss. Not Present- Sensitivity to light, Hearing problems, Nose Bleed and Ringing in the Ears. Neck:Not Present- Swollen Glands and Neck Mass. Respiratory:Present- Shortness of breath with exertion. Not Present- Snoring, Chronic Cough, Bloody sputum and Dyspnea. Cardiovascular:Not Present- Shortness of Breath, Chest Pain, Swelling of Extremities, Leg Cramps and Palpitations. Gastrointestinal:Present- Heartburn and Constipation. Not Present- Bloody Stool, Abdominal Pain, Vomiting, Nausea and Incontinence of Stool. Male Genitourinary:Present- Weak urinary stream, Incontinence and Nocturia. Not Present- Blood in Urine and Frequency. Musculoskeletal:Present- Joint Stiffness, Joint Swelling, Back Pain and Morning Stiffness. Not Present- Muscle Weakness, Muscle Pain and Joint Pain. Neurological:Not Present- Tingling, Numbness, Burning, Tremor, Headaches and Dizziness. Psychiatric:Not Present- Anxiety, Depression and Memory Loss. Endocrine:Not Present- Cold Intolerance, Heat Intolerance, Excessive hunger and Excessive Thirst. Hematology:Not Present- Abnormal Bleeding, Anemia, Blood Clots and Easy  Bruising.   Vitals Weight: 275 lb Height: 71 in Weight was reported by patient. Height was reported by patient. Body Surface Area: 2.5 m Body Mass Index: 38.35 kg/m Pulse: 76 (Regular) Resp.: 14 (Unlabored) BP: 120/82 (Sitting, Left Arm, Standard)    Physical Exam The physical exam findings are as follows:  Note: Patient is a 71 year old male with continued knee pain. Patient is accompanied today by his wife today.   General Mental Status - Alert, cooperative and good historian. General Appearance- pleasant. Not in acute distress. Orientation- Oriented X3. Build & Nutrition- Well nourished and Well developed.   Head and Neck Head- normocephalic, atraumatic . Neck Global Assessment- supple. no bruit auscultated on the right and no bruit auscultated on the left.   Eye Pupil- Bilateral- Regular and Round. Motion- Bilateral- EOMI.  wears glasses  Chest and Lung Exam Auscultation: Breath sounds:- clear at anterior chest wall and - clear at posterior chest wall. Adventitious sounds:- No Adventitious sounds.   Cardiovascular Auscultation:Rhythm- Regular rate and rhythm. Heart Sounds- S1 WNL and S2 WNL. Murmurs & Other Heart Sounds: Murmur 1:Location- Aortic Area and Pulmonic Area. Timing- Holosystolic. Grade- III/VI. Character- Medium pitched.   Abdomen Palpation/Percussion:Tenderness- Abdomen is non-tender to palpation. Rigidity (guarding)- Abdomen is soft. Auscultation:Auscultation of the abdomen reveals - Bowel sounds normal.   Male Genitourinary  Not done, not pertinent to present illness  Musculoskeletal On exam well developed male, alert and oriented in no apparent distress. His right knee shows no swelling. Range about 0 to 125 with excellent stability with full range of motion. Left knee varus deformity, range 5 to 120, marked crepitus on range of motion, tender medial greater than lateral with no  instability.  RADIOGRAPHS: His previous radiographs show bone on bone arthritis in the medial and patellofemoral compartments of the left knee.  Assessment & Plan Osteoarthrosis NOS, lower leg (715.96) Impression: Left Knee  Note: Patient is for a Left Total Knee Replacement by Dr. Aluisio.  Plan is to go home following the hospitalization.  PCP - Dr. Scott Luking Cardiology - Dr. Michael Cooper - Patient has been seen preoperatively and felt to   be stabel for surgery. Dr. Cooper recommended that the patient be placed on Telemetry for at least 24 hours following the procedure to monitor his heart.  Signed electronically by DREW L PERKINS, PA-C 

## 2012-03-01 ENCOUNTER — Inpatient Hospital Stay (HOSPITAL_COMMUNITY)
Admission: RE | Admit: 2012-03-01 | Discharge: 2012-03-03 | DRG: 470 | Disposition: A | Discharge status: Home Health | Payer: Medicare Other | Source: Ambulatory Visit | Attending: Orthopedic Surgery | Admitting: Orthopedic Surgery

## 2012-03-01 ENCOUNTER — Inpatient Hospital Stay (HOSPITAL_COMMUNITY): Payer: Medicare Other | Admitting: Anesthesiology

## 2012-03-01 ENCOUNTER — Encounter (HOSPITAL_COMMUNITY): Payer: Self-pay | Admitting: Anesthesiology

## 2012-03-01 ENCOUNTER — Encounter (HOSPITAL_COMMUNITY)
Admission: RE | Disposition: A | Discharge status: Home Health | Payer: Self-pay | Source: Ambulatory Visit | Attending: Orthopedic Surgery

## 2012-03-01 ENCOUNTER — Encounter (HOSPITAL_COMMUNITY): Payer: Self-pay | Admitting: *Deleted

## 2012-03-01 DIAGNOSIS — I4891 Unspecified atrial fibrillation: Secondary | ICD-10-CM | POA: Diagnosis present

## 2012-03-01 DIAGNOSIS — E871 Hypo-osmolality and hyponatremia: Secondary | ICD-10-CM | POA: Diagnosis not present

## 2012-03-01 DIAGNOSIS — I2510 Atherosclerotic heart disease of native coronary artery without angina pectoris: Secondary | ICD-10-CM | POA: Diagnosis present

## 2012-03-01 DIAGNOSIS — K219 Gastro-esophageal reflux disease without esophagitis: Secondary | ICD-10-CM | POA: Diagnosis present

## 2012-03-01 DIAGNOSIS — Z6839 Body mass index (BMI) 39.0-39.9, adult: Secondary | ICD-10-CM

## 2012-03-01 DIAGNOSIS — E669 Obesity, unspecified: Secondary | ICD-10-CM | POA: Diagnosis present

## 2012-03-01 DIAGNOSIS — R0609 Other forms of dyspnea: Secondary | ICD-10-CM | POA: Diagnosis present

## 2012-03-01 DIAGNOSIS — Z96659 Presence of unspecified artificial knee joint: Secondary | ICD-10-CM

## 2012-03-01 DIAGNOSIS — R0602 Shortness of breath: Secondary | ICD-10-CM | POA: Diagnosis present

## 2012-03-01 DIAGNOSIS — M179 Osteoarthritis of knee, unspecified: Secondary | ICD-10-CM | POA: Diagnosis present

## 2012-03-01 DIAGNOSIS — I10 Essential (primary) hypertension: Secondary | ICD-10-CM | POA: Diagnosis present

## 2012-03-01 DIAGNOSIS — Z01812 Encounter for preprocedural laboratory examination: Secondary | ICD-10-CM

## 2012-03-01 DIAGNOSIS — R0989 Other specified symptoms and signs involving the circulatory and respiratory systems: Secondary | ICD-10-CM | POA: Diagnosis present

## 2012-03-01 DIAGNOSIS — D62 Acute posthemorrhagic anemia: Secondary | ICD-10-CM | POA: Diagnosis not present

## 2012-03-01 DIAGNOSIS — M1710 Unilateral primary osteoarthritis, unspecified knee: Secondary | ICD-10-CM | POA: Diagnosis present

## 2012-03-01 HISTORY — PX: TOTAL KNEE ARTHROPLASTY: SHX125

## 2012-03-01 LAB — TYPE AND SCREEN

## 2012-03-01 SURGERY — ARTHROPLASTY, KNEE, TOTAL
Anesthesia: Spinal | Site: Knee | Laterality: Left | Wound class: Clean

## 2012-03-01 MED ORDER — LACTATED RINGERS IV SOLN: INTRAVENOUS | Status: DC

## 2012-03-01 MED ORDER — METHOCARBAMOL 100 MG/ML IJ SOLN
500.0000 mg | Freq: Four times a day (QID) | INTRAVENOUS | Status: DC | PRN
  Administered 2012-03-01: 500 mg via INTRAVENOUS
  Filled 2012-03-01 (×2): qty 5

## 2012-03-01 MED ORDER — SODIUM CHLORIDE 0.9 % IV SOLN: INTRAVENOUS | Status: DC

## 2012-03-01 MED ORDER — MEPERIDINE HCL 50 MG/ML IJ SOLN: 6.2500 mg | INTRAMUSCULAR | Status: DC | PRN

## 2012-03-01 MED ORDER — BISACODYL 10 MG RE SUPP: 10.0000 mg | Freq: Every day | RECTAL | Status: DC | PRN

## 2012-03-01 MED ORDER — METOCLOPRAMIDE HCL 10 MG PO TABS: 5.0000 mg | ORAL_TABLET | Freq: Three times a day (TID) | ORAL | Status: DC | PRN

## 2012-03-01 MED ORDER — LORATADINE 10 MG PO TABS
10.0000 mg | ORAL_TABLET | Freq: Every day | ORAL | Status: DC
  Administered 2012-03-02 – 2012-03-03 (×2): 10 mg via ORAL
  Filled 2012-03-01 (×3): qty 1

## 2012-03-01 MED ORDER — ACETAMINOPHEN 10 MG/ML IV SOLN
1000.0000 mg | Freq: Once | INTRAVENOUS | Status: AC
  Administered 2012-03-01: 1000 mg via INTRAVENOUS

## 2012-03-01 MED ORDER — MIDAZOLAM HCL 5 MG/5ML IJ SOLN
INTRAMUSCULAR | Status: DC | PRN
  Administered 2012-03-01 (×3): 1 mg via INTRAVENOUS
  Administered 2012-03-01: 2 mg via INTRAVENOUS

## 2012-03-01 MED ORDER — DEXAMETHASONE SODIUM PHOSPHATE 10 MG/ML IJ SOLN
10.0000 mg | Freq: Once | INTRAMUSCULAR | Status: AC
  Filled 2012-03-01: qty 1

## 2012-03-01 MED ORDER — FENTANYL CITRATE 0.05 MG/ML IJ SOLN
INTRAMUSCULAR | Status: DC | PRN
  Administered 2012-03-01: 100 ug via INTRAVENOUS

## 2012-03-01 MED ORDER — DOCUSATE SODIUM 100 MG PO CAPS
100.0000 mg | ORAL_CAPSULE | Freq: Two times a day (BID) | ORAL | Status: DC
  Administered 2012-03-01 – 2012-03-03 (×4): 100 mg via ORAL
  Filled 2012-03-01 (×3): qty 1

## 2012-03-01 MED ORDER — ONDANSETRON HCL 4 MG/2ML IJ SOLN: 4.0000 mg | Freq: Four times a day (QID) | INTRAMUSCULAR | Status: DC | PRN

## 2012-03-01 MED ORDER — HYDROMORPHONE HCL 2 MG PO TABS
2.0000 mg | ORAL_TABLET | ORAL | Status: DC | PRN
  Administered 2012-03-01: 3 mg via ORAL
  Administered 2012-03-01 – 2012-03-02 (×5): 2 mg via ORAL
  Administered 2012-03-02 – 2012-03-03 (×5): 4 mg via ORAL
  Filled 2012-03-01: qty 2
  Filled 2012-03-01: qty 1
  Filled 2012-03-01 (×2): qty 2
  Filled 2012-03-01 (×2): qty 1
  Filled 2012-03-01 (×4): qty 2
  Filled 2012-03-01: qty 1

## 2012-03-01 MED ORDER — HYDROMORPHONE HCL PF 1 MG/ML IJ SOLN
0.2500 mg | INTRAMUSCULAR | Status: DC | PRN
  Administered 2012-03-01 (×2): 0.5 mg via INTRAVENOUS

## 2012-03-01 MED ORDER — KETAMINE HCL 10 MG/ML IJ SOLN
INTRAMUSCULAR | Status: DC | PRN
  Administered 2012-03-01 (×2): 10 mg via INTRAVENOUS

## 2012-03-01 MED ORDER — ACETAMINOPHEN 325 MG PO TABS: 650.0000 mg | ORAL_TABLET | Freq: Four times a day (QID) | ORAL | Status: DC | PRN

## 2012-03-01 MED ORDER — ONDANSETRON HCL 4 MG PO TABS: 4.0000 mg | ORAL_TABLET | Freq: Four times a day (QID) | ORAL | Status: DC | PRN

## 2012-03-01 MED ORDER — PROPOFOL 10 MG/ML IV EMUL
INTRAVENOUS | Status: DC | PRN
  Administered 2012-03-01: 50 ug/kg/min via INTRAVENOUS

## 2012-03-01 MED ORDER — CHLORHEXIDINE GLUCONATE 4 % EX LIQD
60.0000 mL | Freq: Once | CUTANEOUS | Status: DC
  Filled 2012-03-01: qty 60

## 2012-03-01 MED ORDER — ATORVASTATIN CALCIUM 40 MG PO TABS
40.0000 mg | ORAL_TABLET | Freq: Every day | ORAL | Status: DC
  Administered 2012-03-01 – 2012-03-02 (×2): 40 mg via ORAL
  Filled 2012-03-01 (×4): qty 1

## 2012-03-01 MED ORDER — 0.9 % SODIUM CHLORIDE (POUR BTL) OPTIME
TOPICAL | Status: DC | PRN
  Administered 2012-03-01: 1000 mL

## 2012-03-01 MED ORDER — RIVAROXABAN 10 MG PO TABS
10.0000 mg | ORAL_TABLET | Freq: Every day | ORAL | Status: DC
  Administered 2012-03-02 – 2012-03-03 (×2): 10 mg via ORAL
  Filled 2012-03-01 (×3): qty 1

## 2012-03-01 MED ORDER — SODIUM CHLORIDE 0.9 % IV SOLN
INTRAVENOUS | Status: DC
Start: 2012-03-01 — End: 2012-03-03
  Administered 2012-03-01 (×2): via INTRAVENOUS

## 2012-03-01 MED ORDER — BUPIVACAINE ON-Q PAIN PUMP (FOR ORDER SET NO CHG)
INJECTION | Status: DC
  Filled 2012-03-01: qty 1

## 2012-03-01 MED ORDER — DEXAMETHASONE 6 MG PO TABS
10.0000 mg | ORAL_TABLET | Freq: Once | ORAL | Status: AC
  Administered 2012-03-02: 10 mg via ORAL
  Filled 2012-03-01: qty 1

## 2012-03-01 MED ORDER — METOCLOPRAMIDE HCL 5 MG/ML IJ SOLN: 5.0000 mg | Freq: Three times a day (TID) | INTRAMUSCULAR | Status: DC | PRN

## 2012-03-01 MED ORDER — BUPIVACAINE 0.25 % ON-Q PUMP SINGLE CATH 300ML
INJECTION | Status: DC | PRN
  Administered 2012-03-01: 300 mL

## 2012-03-01 MED ORDER — PANTOPRAZOLE SODIUM 40 MG PO TBEC
40.0000 mg | DELAYED_RELEASE_TABLET | Freq: Every day | ORAL | Status: DC
  Filled 2012-03-01 (×2): qty 1

## 2012-03-01 MED ORDER — DIPHENHYDRAMINE HCL 12.5 MG/5ML PO ELIX: 12.5000 mg | ORAL_SOLUTION | ORAL | Status: DC | PRN

## 2012-03-01 MED ORDER — EPHEDRINE SULFATE 50 MG/ML IJ SOLN
INTRAMUSCULAR | Status: DC | PRN
  Administered 2012-03-01: 5 mg via INTRAVENOUS
  Administered 2012-03-01: 10 mg via INTRAVENOUS
  Administered 2012-03-01: 5 mg via INTRAVENOUS

## 2012-03-01 MED ORDER — METHOCARBAMOL 500 MG PO TABS
500.0000 mg | ORAL_TABLET | Freq: Four times a day (QID) | ORAL | Status: DC | PRN
  Administered 2012-03-01 – 2012-03-03 (×6): 500 mg via ORAL
  Filled 2012-03-01 (×6): qty 1

## 2012-03-01 MED ORDER — POLYETHYLENE GLYCOL 3350 17 G PO PACK
17.0000 g | PACK | Freq: Every day | ORAL | Status: DC | PRN
  Filled 2012-03-01: qty 1

## 2012-03-01 MED ORDER — MENTHOL 3 MG MT LOZG
1.0000 | LOZENGE | OROMUCOSAL | Status: DC | PRN
  Filled 2012-03-01: qty 9

## 2012-03-01 MED ORDER — HYDROMORPHONE HCL PF 1 MG/ML IJ SOLN
0.5000 mg | INTRAMUSCULAR | Status: DC | PRN
  Administered 2012-03-01 – 2012-03-02 (×4): 1 mg via INTRAVENOUS
  Filled 2012-03-01 (×4): qty 1

## 2012-03-01 MED ORDER — CEFAZOLIN SODIUM-DEXTROSE 2-3 GM-% IV SOLR
2.0000 g | Freq: Four times a day (QID) | INTRAVENOUS | Status: AC
  Administered 2012-03-01 (×2): 2 g via INTRAVENOUS
  Filled 2012-03-01 (×2): qty 50

## 2012-03-01 MED ORDER — LACTATED RINGERS IV SOLN
INTRAVENOUS | Status: DC | PRN
  Administered 2012-03-01 (×2): via INTRAVENOUS

## 2012-03-01 MED ORDER — POLYETHYLENE GLYCOL 3350 17 G PO PACK
17.0000 g | PACK | Freq: Every day | ORAL | Status: DC | PRN
  Administered 2012-03-01: 17 g via ORAL
  Filled 2012-03-01: qty 1

## 2012-03-01 MED ORDER — ACETAMINOPHEN 650 MG RE SUPP: 650.0000 mg | Freq: Four times a day (QID) | RECTAL | Status: DC | PRN

## 2012-03-01 MED ORDER — DEXTROSE 5 % IV SOLN
3.0000 g | INTRAVENOUS | Status: AC
  Administered 2012-03-01: 3 g via INTRAVENOUS
  Filled 2012-03-01: qty 3000

## 2012-03-01 MED ORDER — ZOLPIDEM TARTRATE 5 MG PO TABS
5.0000 mg | ORAL_TABLET | Freq: Once | ORAL | Status: AC
  Administered 2012-03-01: 5 mg via ORAL
  Filled 2012-03-01: qty 1

## 2012-03-01 MED ORDER — SODIUM CHLORIDE 0.9 % IR SOLN
Status: DC | PRN
  Administered 2012-03-01: 1000 mL

## 2012-03-01 MED ORDER — BUPIVACAINE IN DEXTROSE 0.75-8.25 % IT SOLN
INTRATHECAL | Status: DC | PRN
  Administered 2012-03-01: 1.8 mL via INTRATHECAL

## 2012-03-01 MED ORDER — FLEET ENEMA 7-19 GM/118ML RE ENEM: 1.0000 | ENEMA | Freq: Once | RECTAL | Status: AC | PRN

## 2012-03-01 MED ORDER — PROMETHAZINE HCL 25 MG/ML IJ SOLN: 6.2500 mg | INTRAMUSCULAR | Status: DC | PRN

## 2012-03-01 MED ORDER — PHENOL 1.4 % MT LIQD
1.0000 | OROMUCOSAL | Status: DC | PRN
  Filled 2012-03-01: qty 177

## 2012-03-01 MED ORDER — ACETAMINOPHEN 10 MG/ML IV SOLN
1000.0000 mg | Freq: Four times a day (QID) | INTRAVENOUS | Status: AC
  Administered 2012-03-01 – 2012-03-02 (×3): 1000 mg via INTRAVENOUS
  Filled 2012-03-01 (×4): qty 100

## 2012-03-01 SURGICAL SUPPLY — 52 items
BAG SPEC THK2 15X12 ZIP CLS (MISCELLANEOUS) ×1
BAG ZIPLOCK 12X15 (MISCELLANEOUS) ×2 IMPLANT
BANDAGE ELASTIC 6 VELCRO ST LF (GAUZE/BANDAGES/DRESSINGS) ×2 IMPLANT
BANDAGE ESMARK 6X9 LF (GAUZE/BANDAGES/DRESSINGS) ×1 IMPLANT
BLADE SAG 18X100X1.27 (BLADE) ×2 IMPLANT
BLADE SAW SGTL 11.0X1.19X90.0M (BLADE) ×2 IMPLANT
BNDG CMPR 9X6 STRL LF SNTH (GAUZE/BANDAGES/DRESSINGS) ×1
BNDG ESMARK 6X9 LF (GAUZE/BANDAGES/DRESSINGS) ×2
BOWL SMART MIX CTS (DISPOSABLE) ×2 IMPLANT
CATH KIT ON-Q SILVERSOAK 5 (CATHETERS) ×1 IMPLANT
CATH KIT ON-Q SILVERSOAK 5IN (CATHETERS) ×2 IMPLANT
CEMENT HV SMART SET (Cement) ×4 IMPLANT
CLOTH BEACON ORANGE TIMEOUT ST (SAFETY) ×2 IMPLANT
CUFF TOURN SGL QUICK 34 (TOURNIQUET CUFF) ×2
CUFF TRNQT CYL 34X4X40X1 (TOURNIQUET CUFF) ×1 IMPLANT
DRAPE EXTREMITY T 121X128X90 (DRAPE) ×2 IMPLANT
DRAPE POUCH INSTRU U-SHP 10X18 (DRAPES) ×2 IMPLANT
DRAPE U-SHAPE 47X51 STRL (DRAPES) ×2 IMPLANT
DRSG ADAPTIC 3X8 NADH LF (GAUZE/BANDAGES/DRESSINGS) ×2 IMPLANT
DRSG PAD ABDOMINAL 8X10 ST (GAUZE/BANDAGES/DRESSINGS) ×1 IMPLANT
DURAPREP 26ML APPLICATOR (WOUND CARE) ×2 IMPLANT
ELECT REM PT RETURN 9FT ADLT (ELECTROSURGICAL) ×2
ELECTRODE REM PT RTRN 9FT ADLT (ELECTROSURGICAL) ×1 IMPLANT
EVACUATOR 1/8 PVC DRAIN (DRAIN) ×2 IMPLANT
FACESHIELD LNG OPTICON STERILE (SAFETY) ×10 IMPLANT
GLOVE BIO SURGEON STRL SZ8 (GLOVE) ×2 IMPLANT
GLOVE BIOGEL PI IND STRL 8 (GLOVE) ×2 IMPLANT
GLOVE BIOGEL PI INDICATOR 8 (GLOVE) ×2
GLOVE ECLIPSE 8.0 STRL XLNG CF (GLOVE) ×2 IMPLANT
GOWN STRL NON-REIN LRG LVL3 (GOWN DISPOSABLE) ×3 IMPLANT
GOWN STRL REIN XL XLG (GOWN DISPOSABLE) ×3 IMPLANT
HANDPIECE INTERPULSE COAX TIP (DISPOSABLE) ×2
IMMOBILIZER KNEE 20 (SOFTGOODS) ×2
IMMOBILIZER KNEE 20 THIGH 36 (SOFTGOODS) ×1 IMPLANT
KIT BASIN OR (CUSTOM PROCEDURE TRAY) ×2 IMPLANT
MANIFOLD NEPTUNE II (INSTRUMENTS) ×2 IMPLANT
NS IRRIG 1000ML POUR BTL (IV SOLUTION) ×2 IMPLANT
PACK TOTAL JOINT (CUSTOM PROCEDURE TRAY) ×2 IMPLANT
PADDING CAST COTTON 6X4 STRL (CAST SUPPLIES) ×6 IMPLANT
POSITIONER SURGICAL ARM (MISCELLANEOUS) ×2 IMPLANT
SET HNDPC FAN SPRY TIP SCT (DISPOSABLE) ×1 IMPLANT
SPONGE GAUZE 4X4 12PLY (GAUZE/BANDAGES/DRESSINGS) ×2 IMPLANT
STRIP CLOSURE SKIN 1/2X4 (GAUZE/BANDAGES/DRESSINGS) ×4 IMPLANT
SUCTION FRAZIER 12FR DISP (SUCTIONS) ×2 IMPLANT
SUT MNCRL AB 4-0 PS2 18 (SUTURE) ×2 IMPLANT
SUT VIC AB 2-0 CT1 27 (SUTURE) ×6
SUT VIC AB 2-0 CT1 TAPERPNT 27 (SUTURE) ×3 IMPLANT
SUT VLOC 180 0 24IN GS25 (SUTURE) ×2 IMPLANT
TOWEL OR 17X26 10 PK STRL BLUE (TOWEL DISPOSABLE) ×4 IMPLANT
TRAY FOLEY CATH 14FRSI W/METER (CATHETERS) ×2 IMPLANT
WATER STERILE IRR 1500ML POUR (IV SOLUTION) ×2 IMPLANT
WRAP KNEE MAXI GEL POST OP (GAUZE/BANDAGES/DRESSINGS) ×3 IMPLANT

## 2012-03-01 NOTE — Anesthesia Procedure Notes (Signed)
Spinal  Patient location during procedure: OR Staffing Anesthesiologist: Tashyra Adduci Performed by: anesthesiologist  Preanesthetic Checklist Completed: patient identified, site marked, surgical consent, pre-op evaluation, timeout performed, IV checked, risks and benefits discussed and monitors and equipment checked Spinal Block Patient position: sitting Prep: Betadine Patient monitoring: heart rate, continuous pulse ox and blood pressure Approach: right paramedian Location: L3-4 Injection technique: single-shot Needle Needle type: Spinocan  Needle gauge: 22 G Needle length: 9 cm Additional Notes Expiration date of kit checked and confirmed. Patient tolerated procedure well, without complications.     

## 2012-03-01 NOTE — Op Note (Signed)
Pre-operative diagnosis- Osteoarthritis  Left knee(s)  Post-operative diagnosis- Osteoarthritis Left knee(s)  Procedure-  Left  Total Knee Arthroplasty  Surgeon- Gus Rankin. Susan Bleich, MD  Assistant- Leilani Able, PA-C   Anesthesia-  Spinal EBL-* No blood loss amount entered *  Drains Hemovac  Tourniquet time-  Total Tourniquet Time Documented: Thigh (Left) - 40 minutes   Complications- None  Condition-PACU - hemodynamically stable.   Brief Clinical Note   Steven [name] is a 71 y.o. year old male with end stage OA of his left knee with progressively worsening pain and dysfunction. He has constant pain, with activity and at rest and significant functional deficits with difficulties even with ADLs. He has had extensive non-op management including analgesics, injections of cortisone and viscosupplements, and home exercise program, but remains in significant pain with significant dysfunction. Radiographs show bone on bone arthritis medial compartment. He presents now for left Total Knee Arthroplasty.     Procedure in detail---   The patient is brought into the operating room and positioned supine on the operating table. After successful administration of  Spinal,   a tourniquet is placed high on the  Left thigh(s) and the lower extremity is prepped and draped in the usual sterile fashion. Time out is performed by the operating team and then the  Left lower extremity is wrapped in Esmarch, knee flexed and the tourniquet inflated to 300 mmHg.       A midline incision is made with a ten blade through the subcutaneous tissue to the level of the extensor mechanism. A fresh blade is used to make a medial parapatellar arthrotomy. Soft tissue over the proximal medial tibia is subperiosteally elevated to the joint line with a knife and into the semimembranosus bursa with a Cobb elevator. Soft tissue over the proximal lateral tibia is elevated with attention being paid to avoiding the patellar tendon on the  tibial tubercle. The patella is everted, knee flexed 90 degrees and the ACL and PCL are removed. Findings are bone on bone medial and patellofemoral with large osteophytes.        The drill is used to create a starting hole in the distal femur and the canal is thoroughly irrigated with sterile saline to remove the fatty contents. The 5 degree Left  valgus alignment guide is placed into the femoral canal and the distal femoral cutting block is pinned to remove 11 mm off the distal femur. Resection is made with an oscillating saw.      The tibia is subluxed forward and the menisci are removed. The extramedullary alignment guide is placed referencing proximally at the medial aspect of the tibial tubercle and distally along the second metatarsal axis and tibial crest. The block is pinned to remove 2mm off the more deficient medial  side. Resection is made with an oscillating saw. Size 4is the most appropriate size for the tibia and the proximal tibia is prepared with the modular drill and keel punch for that size.      The femoral sizing guide is placed and size 5 is most appropriate. Rotation is marked off the epicondylar axis and confirmed by creating a rectangular flexion gap at 90 degrees. The size 5 cutting block is pinned in this rotation and the anterior, posterior and chamfer cuts are made with the oscillating saw. The intercondylar block is then placed and that cut is made.      Trial size 4 tibial component, trial size 5 posterior stabilized femur and a 12.5  mm posterior stabilized rotating platform insert trial is placed. Full extension is achieved with excellent varus/valgus and anterior/posterior balance throughout full range of motion. The patella is everted and thickness measured to be 27  mm. Free hand resection is taken to 15 mm, a 41 template is placed, lug holes are drilled, trial patella is placed, and it tracks normally. Osteophytes are removed off the posterior femur with the trial in place.  All trials are removed and the cut bone surfaces prepared with pulsatile lavage. Cement is mixed and once ready for implantation, the size 4 tibial implant, size  5 posterior stabilized femoral component, and the size 41 patella are cemented in place and the patella is held with the clamp. The trial insert is placed and the knee held in full extension. All extruded cement is removed and once the cement is hard the permanent 12.5 mm posterior stabilized rotating platform insert is placed into the tibial tray.      The wound is copiously irrigated with saline solution and the extensor mechanism closed over a hemovac drain with #1 PDS suture. The tourniquet is released for a total tourniquet time of 39  minutes. Flexion against gravity is 140 degrees and the patella tracks normally. Subcutaneous tissue is closed with 2.0 vicryl and subcuticular with running 4.0 Monocryl. The catheter for the Marcaine pain pump is placed and the pump is initiated. The incision is cleaned and dried and steri-strips and a bulky sterile dressing are applied. The limb is placed into a knee immobilizer and the patient is awakened and transported to recovery in stable condition.      Please note that a surgical assistant was a medical necessity for this procedure in order to perform it in a safe and expeditious manner. Surgical assistant was necessary to retract the ligaments and vital neurovascular structures to prevent injury to them and also necessary for proper positioning of the limb to allow for anatomic placement of the prosthesis.   Gus Rankin Orlena Garmon, MD    03/01/2012, 8:15 AM

## 2012-03-01 NOTE — Care Management Note (Signed)
    Page 1 of 2   03/03/2012     5:52:36 PM   CARE MANAGEMENT NOTE 03/03/2012  Patient:  Steven [name], Steven [name]   Account Number:  [id number]  Date Initiated:  03/01/2012  Documentation initiated by:  Lanier Clam  Subjective/Objective Assessment:   ADMITTED W/OA L KNEE.     Action/Plan:   FROM HOME W/SPOUSE.HAS RW.USED [facility] IN PAST.   Anticipated DC Date:  03/03/2012   Anticipated DC Plan:  HOME W HOME HEALTH SERVICES      DC Planning Services  CM consult      [facility] Choice  HOME HEALTH   Choice offered to / List presented to:  C-1 Patient   DME arranged  NA      DME agency  NA     HH arranged  HH-2 PT      HH agency  [facility]   Status of service:  Completed, signed off Medicare Important Message given?  NA - LOS <3 / Initial given by admissions (If response is "NO", the following Medicare IM given date fields will be blank) Date Medicare IM given:   Date Additional Medicare IM given:    Discharge Disposition:  HOME W HOME HEALTH SERVICES  Per UR Regulation:  Reviewed for med. necessity/level of care/duration of stay  If discussed at Long Length of Stay Meetings, dates discussed:    Comments:  03/03/2012 Steven [name] BSN CCM [phone] PT wants [facility] and wants Steven [name] for PT services. States he will need RW. Spoke with Steven [name] at [facility] intake and [facility] will be able to provide HHpt services with requested physical therapist. Services will start within 48hrs of discharge. Pt advised and given HH contact information.   03/01/12 Steven MAHABIR RN,BSN NCM [phone] S/P L TKA.RECEIVED CM CONS FOR HH.TC [facility]-Steven [name] INFORMED OF REFERRAL IF HH NEEDED, FACE SHEET, & H&P /WCONFIRMATION.TEL#[phone] FAX#[phone].AWAIT PT RECOMMENDATIONS.

## 2012-03-01 NOTE — Anesthesia Postprocedure Evaluation (Signed)
  Anesthesia Post-op Note  Patient: Steven [name]  Procedure(s) Performed: Procedure(s) (LRB): TOTAL KNEE ARTHROPLASTY (Left)  Patient Location: PACU  Anesthesia Type: Spinal  Level of Consciousness: awake and alert   Airway and Oxygen Therapy: Patient Spontanous Breathing  Post-op Pain: mild  Post-op Assessment: Post-op Vital signs reviewed, Patient's Cardiovascular Status Stable, Respiratory Function Stable, Patent Airway and No signs of Nausea or vomiting  Post-op Vital Signs: stable  Complications: No apparent anesthesia complications

## 2012-03-01 NOTE — Transfer of Care (Signed)
Immediate Anesthesia Transfer of Care Note  Patient: Steven [name]  Procedure(s) Performed: Procedure(s) (LRB) with comments: TOTAL KNEE ARTHROPLASTY (Left)  Patient Location: PACU  Anesthesia Type:Spinal  Level of Consciousness: awake, alert , oriented and patient cooperative  Airway & Oxygen Therapy: Patient Spontanous Breathing and Patient connected to face mask oxygen  Post-op Assessment: Report given to PACU RN, Post -op Vital signs reviewed and stable and can feel touch to toes, able to wiggle feet.  Post vital signs: Reviewed and stable  Complications: No apparent anesthesia complications

## 2012-03-01 NOTE — H&P (View-Only) (Signed)
Steven [name]  DOB: 1940/09/30 Married / Language: English / Race: White Male  Date of Admission:  03/01/12  Chief Complaint:  Left Knee Pain  History of Present Illness The patient is a 71 year old male who comes in for a preoperative History and Physical. The patient is scheduled for a left total knee arthroplasty to be performed by Dr. Gus Rankin. Aluisio, MD at [facility] on 03/01/2012. The patient is a 71 year old male who presents today for follow up of their knee. The patient is being followed for their left knee pain and osteoarthritis. They are now 3 month(s) (from last injection) out. Symptoms reported today include: pain. 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 He has pain with weightbearing, instability, and swelling. He says that if he sits a long time he has a lot of pain and stiffness as well. He had a cortisone injection in the right knee in April but only had relief for one month. He had Synvisc in the right knee before it was replaced and reports that it worked well. The left knee is getting progressively worse on him. He is at a stage where the left knee is giving him the most trouble. Right knee has gotten a lot better. He is ready to get the left knee fixed. They have been treated conservatively in the past for the above stated problem and despite conservative measures, they continue to have progressive pain and severe functional limitations and dysfunction. They have failed non-operative management including home exercise, medications, and injections. It is felt that they would benefit from undergoing total joint replacement. Risks and benefits of the procedure have been discussed with the patient and they elect to proceed with surgery. There are no active contraindications to surgery such as ongoing infection or rapidly progressive neurological disease.  Allergies Cipro *Fluoroquinolones PERCOCET.  11/02/2009   Family History Chronic Obstructive Lung Disease. father Congestive Heart Failure. mother mother, sister and grandfather mothers side Osteoarthritis. mother and father Rheumatoid Arthritis. mother and father Heart disease in male family member before age 38 Hypertension. mother Cancer. grandmother mothers side and grandmother fathers side father, grandmother mothers side and grandmother fathers side Heart Disease. father, sister and grandfather mothers side Osteoporosis. mother and father   Social History Pain Contract. yes no Tobacco use. Former smoker. never smoker never smoker; uses 2 or more can(s) smokeless per week Tobacco / smoke exposure. no Advance Directives. Living will, Healthcare power of attorney Post-Surgical Plans. Plan for home Previously in rehab. no Number of flights of stairs before winded. 2-3 Marital status. married Living situation. live with spouse Illicit drug use. 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 never consumed alcohol Drug/Alcohol Rehab (Previously). no No alcohol use Children. 3 Exercise. Exercises weekly; does running / walking Exercises daily; does other Drug/Alcohol Rehab (Currently). no   Medication History Atorvastatin Calcium (40MG  Tablet, Oral) Active. ZyrTEC Allergy (10MG  Tablet, Oral) Active. Vitamin D (1000UNIT Capsule, Oral) Active. Fish Oil Active. HYDROmorphone HCl (4MG  Tablet, Oral) Active. Lisinopril-Hydrochlorothiazide (10-12.5MG  Tablet, Oral) Active. Omeprazole (20MG  Capsule DR, Oral) Active. Aspirin Childrens (81MG  Tablet Chewable, Oral) Active. Citrucel (500MG  Tablet, Oral) Active. Senna-Docusate Sodium (8.6-50MG  Tablet, Oral) Active.   Past Surgical History Right Eye Laser Surgery. Repair retina Cardia Cath x2 Total Knee Replacement - Right Other Surgery. BLOCKAGE FROM PREVIOUS GALLBLADDER  SURG/HERNIA Gallbladder Surgery. Date: 2012. laporoscopic Right Total  Knee Revision. Date: 2012.   Past Medical History Chronic Pain Impaired Vision. cataracts Gastroesophageal Reflux Disease Coronary artery disease Osteoarthritis Hypercholesterolemia High blood pressure Hemorrhoids Shingles Impaired Hearing. uses hearing aides Cardiac Arrhythmia . Atrial Fib Anemia Urinary Incontinence. mild Osteoarthrosis NOS, lower leg (715.96). 06/04/2005 Lumbago (724.2). 06/24/2005   Review of Systems General:Present- Feeling sick. Not Present- Chills, Fever, Night Sweats, Appetite Loss, Fatigue, Weight Gain and Weight Loss. Skin:Present- Itching and Rash. Not Present- Skin Color Changes, Ulcer, Psoriasis and Change in Hair or Nails. HEENT:Present- Blurred Vision and Hearing Loss. Not Present- Sensitivity to light, Hearing problems, Nose Bleed and Ringing in the Ears. Neck:Not Present- Swollen Glands and Neck Mass. Respiratory:Present- Shortness of breath with exertion. Not Present- Snoring, Chronic Cough, Bloody sputum and Dyspnea. Cardiovascular:Not Present- Shortness of Breath, Chest Pain, Swelling of Extremities, Leg Cramps and Palpitations. Gastrointestinal:Present- Heartburn and Constipation. Not Present- Bloody Stool, Abdominal Pain, Vomiting, Nausea and Incontinence of Stool. Male Genitourinary:Present- Weak urinary stream, Incontinence and Nocturia. Not Present- Blood in Urine and Frequency. Musculoskeletal:Present- Joint Stiffness, Joint Swelling, Back Pain and Morning Stiffness. Not Present- Muscle Weakness, Muscle Pain and Joint Pain. Neurological:Not Present- Tingling, Numbness, Burning, Tremor, Headaches and Dizziness. Psychiatric:Not Present- Anxiety, Depression and Memory Loss. Endocrine:Not Present- Cold Intolerance, Heat Intolerance, Excessive hunger and Excessive Thirst. Hematology:Not Present- Abnormal Bleeding, Anemia, Blood Clots and Easy  Bruising.   Vitals Weight: 275 lb Height: 71 in Weight was reported by patient. Height was reported by patient. Body Surface Area: 2.5 m Body Mass Index: 38.35 kg/m Pulse: 76 (Regular) Resp.: 14 (Unlabored) BP: 120/82 (Sitting, Left Arm, Standard)    Physical Exam The physical exam findings are as follows:  Note: Patient is a 71 year old male with continued knee pain. Patient is accompanied today by his wife today.   General Mental Status - Alert, cooperative and good historian. General Appearance- pleasant. Not in acute distress. Orientation- Oriented X3. Build & Nutrition- Well nourished and Well developed.   Head and Neck Head- normocephalic, atraumatic . Neck Global Assessment- supple. no bruit auscultated on the right and no bruit auscultated on the left.   Eye Pupil- Bilateral- Regular and Round. Motion- Bilateral- EOMI.  wears glasses  Chest and Lung Exam Auscultation: Breath sounds:- clear at anterior chest wall and - clear at posterior chest wall. Adventitious sounds:- No Adventitious sounds.   Cardiovascular Auscultation:Rhythm- Regular rate and rhythm. Heart Sounds- S1 WNL and S2 WNL. Murmurs & Other Heart Sounds: Murmur 1:Location- Aortic Area and Pulmonic Area. Timing- Holosystolic. Grade- III/VI. Character- Medium pitched.   Abdomen Palpation/Percussion:Tenderness- Abdomen is non-tender to palpation. Rigidity (guarding)- Abdomen is soft. Auscultation:Auscultation of the abdomen reveals - Bowel sounds normal.   Male Genitourinary  Not done, not pertinent to present illness  Musculoskeletal On exam well developed male, alert and oriented in no apparent distress. His right knee shows no swelling. Range about 0 to 125 with excellent stability with full range of motion. Left knee varus deformity, range 5 to 120, marked crepitus on range of motion, tender medial greater than lateral with no  instability.  RADIOGRAPHS: His previous radiographs show bone on bone arthritis in the medial and patellofemoral compartments of the left knee.  Assessment & Plan Osteoarthrosis NOS, lower leg (715.96) Impression: Left Knee  Note: Patient is for a Left Total Knee Replacement by Dr. Lequita Halt.  Plan is to go home following the hospitalization.  PCP - Dr. Lilyan Punt Cardiology - Dr. Tonny Bollman - Patient has been seen preoperatively and felt to  be stabel for surgery. Dr. Excell Seltzer recommended that the patient be placed on Telemetry for at least 24 hours following the procedure to monitor his heart.  Signed electronically by Roberts Gaudy, PA-C

## 2012-03-01 NOTE — Anesthesia Preprocedure Evaluation (Addendum)
Anesthesia Evaluation  Patient identified by MRN, date of birth, ID band Patient awake    Reviewed: Allergy & Precautions, H&P , NPO status , Patient's Chart, lab work & pertinent test results, Unable to perform ROS - Chart review only  Airway Mallampati: II TM Distance: >3 FB Neck ROM: Full    Dental  (+) Teeth Intact, Dental Advisory Given and Caps,    Pulmonary neg pulmonary ROS, shortness of breath and at rest,  Low O2 sats this hospitalization.  Chest CT shows large pleural effusion and collapse of right middle and lower lobe.   + decreased breath sounds - stridor     Cardiovascular hypertension, Pt. on medications + CAD negative cardio ROS  + dysrhythmias Atrial Fibrillation Rhythm:Regular Rate:Normal  Non-obstructive CAD. Stable.  Rapid A fib on cardizem. Controlled.   Neuro/Psych negative neurological ROS  negative psych ROS   GI/Hepatic negative GI ROS, Neg liver ROS, GERD-  Medicated and Controlled,  Endo/Other  negative endocrine ROS  Renal/GU negative Renal ROS  negative genitourinary   Musculoskeletal negative musculoskeletal ROS (+)   Abdominal   Peds negative pediatric ROS (+)  Hematology negative hematology ROS (+)   Anesthesia Other Findings Upper front caps   Reproductive/Obstetrics negative OB ROS                         Anesthesia Physical  Anesthesia Plan  ASA: III  Anesthesia Plan: Spinal   Post-op Pain Management:    Induction:   Airway Management Planned: Simple Face Mask  Additional Equipment:   Intra-op Plan:   Post-operative Plan:   Informed Consent: I have reviewed the patients History and Physical, chart, labs and discussed the procedure including the risks, benefits and alternatives for the proposed anesthesia with the patient or authorized representative who has indicated his/her understanding and acceptance.   Dental advisory given  Plan  Discussed with: CRNA and Surgeon  Anesthesia Plan Comments:       Anesthesia Quick Evaluation

## 2012-03-01 NOTE — Interval H&P Note (Signed)
History and Physical Interval Note:  03/01/2012 6:49 AM  Steven [name]  has presented today for surgery, with the diagnosis of osteoarthritis left knee  The various methods of treatment have been discussed with the patient and family. After consideration of risks, benefits and other options for treatment, the patient has consented to  Procedure(s) (LRB) with comments: TOTAL KNEE ARTHROPLASTY (Left) as a surgical intervention .  The patient's history has been reviewed, patient examined, no change in status, stable for surgery.  I have reviewed the patient's chart and labs.  Questions were answered to the patient's satisfaction.     Loanne Drilling

## 2012-03-01 NOTE — Evaluation (Signed)
Physical Therapy Evaluation Patient Details Name: Steven [name] MRN: [id number] DOB: 07-19-1940 Today's Date: 03/01/2012 Time: 4098-1191 PT Time Calculation (min): 19 min  PT Assessment / Plan / Recommendation Clinical Impression  Pt s/p L TKR and agreeable to mobility POD #0.  Pt would benefit from acute PT services in order to improve independence with transfers, ambulation and stairs prior to d/c home with spouse.    PT Assessment  Patient needs continued PT services    Follow Up Recommendations  Home health PT    Does the patient have the potential to tolerate intense rehabilitation      Barriers to Discharge        Equipment Recommendations  Rolling walker with 5" wheels    Recommendations for Other Services     Frequency 7X/week    Precautions / Restrictions Precautions Precautions: Knee Required Braces or Orthoses: Knee Immobilizer - Left Knee Immobilizer - Left: Discontinue once straight leg raise with < 10 degree lag Restrictions Weight Bearing Restrictions: Yes LLE Weight Bearing: Weight bearing as tolerated   Pertinent Vitals/Pain 7/10 back pain per pt, repositioned in recliner and pt reported some relief      Mobility  Bed Mobility Bed Mobility: Supine to Sit Supine to Sit: 4: Min assist;HOB elevated Details for Bed Mobility Assistance: verbal cues for technique, assist for L LE Transfers Transfers: Sit to Stand;Stand to Sit Sit to Stand: 4: Min assist;With upper extremity assist;From bed Stand to Sit: 4: Min guard;With upper extremity assist;To chair/3-in-1 Details for Transfer Assistance: verbal cues for safe technique (ie hand placement, L LE forward) Ambulation/Gait Ambulation/Gait Assistance: 4: Min assist Ambulation Distance (Feet): 60 Feet Assistive device: Rolling walker Ambulation/Gait Assistance Details: verbal cues for sequence, RW distance, step length, assist to steady initially Gait Pattern: Step-to pattern;Antalgic Gait velocity:  decreased    Shoulder Instructions     Exercises     PT Diagnosis: Difficulty walking;Acute pain  PT Problem List: Decreased strength;Decreased range of motion;Decreased activity tolerance;Decreased mobility;Decreased knowledge of precautions;Pain PT Treatment Interventions: DME instruction;Gait training;Stair training;Functional mobility training;Patient/family education;Therapeutic activities;Therapeutic exercise   PT Goals Acute Rehab PT Goals PT Goal Formulation: With patient Time For Goal Achievement: 03/08/12 Pt will go Supine/Side to Sit: with modified independence PT Goal: Supine/Side to Sit - Progress: Goal set today Pt will go Sit to Supine/Side: with modified independence PT Goal: Sit to Supine/Side - Progress: Goal set today Pt will go Sit to Stand: with modified independence PT Goal: Sit to Stand - Progress: Goal set today Pt will go Stand to Sit: with modified independence PT Goal: Stand to Sit - Progress: Goal set today Pt will Ambulate: 51 - 150 feet;with modified independence;with least restrictive assistive device PT Goal: Ambulate - Progress: Goal set today Pt will Go Up / Down Stairs: 3-5 stairs;with supervision;with least restrictive assistive device PT Goal: Up/Down Stairs - Progress: Goal set today Pt will Perform Home Exercise Program: with supervision, verbal cues required/provided PT Goal: Perform Home Exercise Program - Progress: Goal set today  Visit Information  Last PT Received On: 03/01/12 Assistance Needed: +1    Subjective Data  Subjective: It's not gonna fall apart on me, is it?  (regarding his knee with ambulation)   Prior Functioning  Home Living Lives With: Spouse Type of Home: House Home Access: Stairs to enter Secretary/administrator of Steps: 3 Entrance Stairs-Rails: None Home Layout: One level Home Adaptive Equipment: Bedside commode/3-in-1 Additional Comments: Pt has mother's RW and reports old and will need  updated RW Prior  Function Level of Independence: Independent Communication Communication: No difficulties    Cognition  Overall Cognitive Status: Appears within functional limits for tasks assessed/performed Arousal/Alertness: Awake/alert Orientation Level: Appears intact for tasks assessed Behavior During Session: [facility] for tasks performed    Extremity/Trunk Assessment Right Upper Extremity Assessment RUE ROM/Strength/Tone: [facility] for tasks assessed Left Upper Extremity Assessment LUE ROM/Strength/Tone: WFL for tasks assessed Right Lower Extremity Assessment RLE ROM/Strength/Tone: [facility] for tasks assessed Left Lower Extremity Assessment LLE ROM/Strength/Tone: Deficits LLE ROM/Strength/Tone Deficits: decreased movement against gravity, unable to lift LE against gravity so maintained KI   Balance    End of Session PT - End of Session Equipment Utilized During Treatment: Gait belt;Left knee immobilizer Activity Tolerance: Patient tolerated treatment well Patient left: in chair;with call bell/phone within reach;with family/visitor present CPM Left Knee CPM Left Knee: Off  GP     [name],KATHrine E 03/01/2012, 3:21 PM Pager: [phone]

## 2012-03-02 ENCOUNTER — Encounter (HOSPITAL_COMMUNITY): Payer: Self-pay | Admitting: Orthopedic Surgery

## 2012-03-02 LAB — BASIC METABOLIC PANEL
CO2: 25 mEq/L (ref 19–32)
Calcium: 8.4 mg/dL (ref 8.4–10.5)
Creatinine, Ser: 0.91 mg/dL (ref 0.50–1.35)
Glucose, Bld: 142 mg/dL — ABNORMAL HIGH (ref 70–99)

## 2012-03-02 LAB — CBC
HCT: 35 % — ABNORMAL LOW (ref 39.0–52.0)
Hemoglobin: 12 g/dL — ABNORMAL LOW (ref 13.0–17.0)
MCH: 31.1 pg (ref 26.0–34.0)
MCV: 90.7 fL (ref 78.0–100.0)
RBC: 3.86 MIL/uL — ABNORMAL LOW (ref 4.22–5.81)

## 2012-03-02 MED ORDER — NON FORMULARY: 20.0000 mg | Freq: Every day | Status: DC

## 2012-03-02 MED ORDER — GUAIFENESIN ER 600 MG PO TB12
600.0000 mg | ORAL_TABLET | Freq: Two times a day (BID) | ORAL | Status: DC
  Administered 2012-03-02 – 2012-03-03 (×2): 600 mg via ORAL
  Filled 2012-03-02 (×3): qty 1

## 2012-03-02 MED ORDER — OMEPRAZOLE 20 MG PO CPDR
20.0000 mg | DELAYED_RELEASE_CAPSULE | Freq: Every day | ORAL | Status: DC
  Administered 2012-03-02 – 2012-03-03 (×2): 20 mg via ORAL
  Filled 2012-03-02 (×2): qty 1

## 2012-03-02 NOTE — Evaluation (Signed)
Occupational Therapy Evaluation Patient Details Name: Steven [name] MRN: [id number] DOB: 05/27/1940 Today's Date: 03/02/2012 Time: 1601-0932 OT Time Calculation (min): 26 min  OT Assessment / Plan / Recommendation Clinical Impression  Pt presents s/p POD 1 LTKR. Pt has had other knee replaced in the past. Has all necessary DME and will have prn A from wife at home. All education completed.    OT Assessment  Patient does not need any further OT services    Follow Up Recommendations  No OT follow up    Barriers to Discharge      Equipment Recommendations  Rolling walker with 5" wheels    Recommendations for Other Services    Frequency       Precautions / Restrictions Precautions Precautions: Knee Required Braces or Orthoses: Knee Immobilizer - Left Knee Immobilizer - Left: Discontinue once straight leg raise with < 10 degree lag Restrictions Weight Bearing Restrictions: No LLE Weight Bearing: Weight bearing as tolerated   Pertinent Vitals/Pain Pt reported 5-6/10 L knee pain at end of session. Repositioned and cold applied.    ADL  Grooming: Performed;Wash/dry hands;Min guard Where Assessed - Grooming: Supported standing Lower Body Bathing: Simulated;Minimal assistance Where Assessed - Lower Body Bathing: Supported sit to stand Lower Body Dressing: Simulated;Moderate assistance Where Assessed - Lower Body Dressing: Supported sit to stand Toilet Transfer: Performed;Min Pension scheme manager Method: Sit to Barista: Raised toilet seat with arms (or 3-in-1 over toilet) Toileting - Clothing Manipulation and Hygiene: Performed;Minimal assistance Where Assessed - Engineer, mining and Hygiene: Sit to stand from 3-in-1 or toilet Tub/Shower Transfer: Performed;Minimal assistance Equipment Used: Rolling walker Transfers/Ambulation Related to ADLs: Pt ambulated to the bathroom with minguard A and RW.  ADL Comments: Min cues for safety stepping  into and out of walk in shower and transferring up and down off of 3:1.    OT Diagnosis:    OT Problem List:   OT Treatment Interventions:     OT Goals    Visit Information  Last OT Received On: 03/02/12 Assistance Needed: +1    Subjective Data  Subjective: This is the 4th time Steven [name] been through this. 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   Prior Functioning     Home Living Lives With: Spouse Available Help at Discharge: Family Type of Home: House Home Access: Stairs to enter Secretary/administrator of Steps: 3 Entrance Stairs-Rails: None Home Layout: One level Bathroom Shower/Tub: Health visitor: Standard Home Adaptive Equipment: Shower chair with back;Bedside commode/3-in-1 Prior Function Level of Independence: Independent Able to Take Stairs?: Yes Driving: Yes Vocation: Part time employment Communication Communication: No difficulties Dominant Hand: Right         Vision/Perception     Cognition  Overall Cognitive Status: Appears within functional limits for tasks assessed/performed Arousal/Alertness: Awake/alert Orientation Level: Appears intact for tasks assessed Behavior During Session: [facility] for tasks performed    Extremity/Trunk Assessment Right Upper Extremity Assessment RUE ROM/Strength/Tone: [facility] for tasks assessed Left Upper Extremity Assessment LUE ROM/Strength/Tone: WFL for tasks assessed     Mobility Transfers Sit to Stand: 4: Min guard;4: Min assist;With upper extremity assist;From chair/3-in-1;With armrests Stand to Sit: 4: Min guard;4: Min assist;With upper extremity assist;With armrests;To chair/3-in-1 Details for Transfer Assistance: verbal cues for safe technique (ie hand placement, L LE forward)     Shoulder Instructions     Exercise     Balance     End of Session OT -  End of Session Activity Tolerance: Patient tolerated treatment well Patient left: in chair;with call bell/phone within  reach;with family/visitor present CPM Left Knee CPM Left Knee: Off  GO     Steven [name] A OTR/L [id number] 03/02/2012, 11:25 AM

## 2012-03-02 NOTE — Progress Notes (Signed)
Subjective: 1 Day Post-Op Procedure(s) (LRB): TOTAL KNEE ARTHROPLASTY (Left) Patient reports pain as mild and moderate.   Patient seen in rounds with Dr. Lequita Halt.  Wife in room at bedside. Patient is well, but has had some minor complaints of pain in the knee, requiring pain medications We will start therapy today.  He was place on Telemetry for 24 hours postop.  Had a good night and will transfer up to the ortho floor. Actually walked about 60 feet on the day of surgery. Plan is to go Home after hospital stay.  Objective: Vital signs in last 24 hours: Temp:  [97.5 F (36.4 C)-98.3 F (36.8 C)] 98.3 F (36.8 C) (11/19 0531) Pulse Rate:  [71-102] 102  (11/19 0531) Resp:  [13-20] 16  (11/19 0531) BP: (108-149)/(65-86) 149/86 mmHg (11/19 0531) SpO2:  [94 %-100 %] 95 % (11/19 0531) FiO2 (%):  [2 %] 2 % (11/18 1300) Weight:  [126.826 kg (279 lb 9.6 oz)] 126.826 kg (279 lb 9.6 oz) (11/18 1100)  Intake/Output from previous day:  Intake/Output Summary (Last 24 hours) at 03/02/12 0813 Last data filed at 03/02/12 0700  Gross per 24 hour  Intake   3880 ml  Output   2500 ml  Net   1380 ml    Intake/Output this shift: UOP 500 +2330  Labs:  [facility] 03/02/12 0445  HGB 12.0*    Basename 03/02/12 0445  WBC 7.9  RBC 3.86*  HCT 35.0*  PLT 150    Basename 03/02/12 0445  NA 132*  K 4.4  CL 98  CO2 25  BUN 19  CREATININE 0.91  GLUCOSE 142*  CALCIUM 8.4   No results found for this basename: LABPT:2,INR:2 in the last 72 hours  EXAM General - Patient is Alert, Appropriate and Oriented Extremity - Neurovascular intact Sensation intact distally Dorsiflexion/Plantar flexion intact Dressing - dressing C/D/I Motor Function - intact, moving foot and toes well on exam.  Hemovac pulled without difficulty.  Past Medical History  Diagnosis Date  . Obesity   . Dyslipidemia   . Coronary artery disease 02-27-11    Dr. Marliss Coots follows-has some blocked coronary  arteries ,not suitable for stent  placement  . Hypertension   . Shingles outbreak 02-27-11    2 weeks ago , was tx.-only residual is tenderness of right scalp-no open areas  . GERD (gastroesophageal reflux disease) 02-27-11    Acid reflux  . Hemorrhoids 02-27-11    not bothersome at this time  . Hearing loss 02-27-11    Bilateral hearing aids-due to exposure to loud machinery  . Urinary incontinence 02-27-11    not an everday occurrence-no special measures  . Osteoarthritis 02-27-11    Ostearthritis-knees, shoulders.Back causes chronic pain-radiates down right leg  . Chronic pain   . Hyperlipidemia   . Leg swelling   . Rash   . Complication of anesthesia     pt woke up during last surgery   . Dysrhythmia     hx of atrial fib during last hospitalization   . Shortness of breath     with exertion     Assessment/Plan: 1 Day Post-Op Procedure(s) (LRB): TOTAL KNEE ARTHROPLASTY (Left) Principal Problem:  *OA (osteoarthritis) of knee Active Problems:  OTHER DYSPNEA AND RESPIRATORY ABNORMALITIES  Postop Hyponatremia  Estimated Body mass index is 39.00 kg/(m^2) as calculated from the following:   Height as of this encounter: 5\' 11" (1.803 m).   Weight as of this encounter: 279 lb 9.6 oz(126.826 kg). Advance diet Up  with therapy Discharge home with home health  DVT Prophylaxis - Xarelto, 81 mg Aspirin on hold Weight-Bearing as tolerated to left leg No vaccines. D/C O2 and Pulse OX and try on Room Air DC Tele and transfer to the ortho floor.   PERKINS, ALEXZANDREW 03/02/2012, 8:13 AM

## 2012-03-02 NOTE — Progress Notes (Signed)
Physical Therapy Treatment Patient Details Name: Steven [name] MRN: [id number] DOB: 1940/10/26 Today's Date: 03/02/2012 Time: 4696-2952 PT Time Calculation (min): 24 min  PT Assessment / Plan / Recommendation Comments on Treatment Session  pt. experiencing more paun in Knee today. Pt. tolerated ambulation. Plans DC home in 1-2 days per pt.    Follow Up Recommendations  Home health PT     Does the patient have the potential to tolerate intense rehabilitation     Barriers to Discharge        Equipment Recommendations  Rolling walker with 5" wheels    Recommendations for Other Services    Frequency 7X/week   Plan Discharge plan remains appropriate    Precautions / Restrictions Precautions Precautions: Knee Required Braces or Orthoses: Knee Immobilizer - Left Knee Immobilizer - Left: Discontinue once straight leg raise with < 10 degree lag Restrictions Weight Bearing Restrictions: No LLE Weight Bearing: Weight bearing as tolerated   Pertinent Vitals/Pain 5-6 L knee , ice applied , was premedicated.    Mobility  Transfers Sit to Stand: 4: Min guard;4: Min assist;With upper extremity assist;From chair/3-in-1;With armrests Stand to Sit: 4: Min guard;4: Min assist;With upper extremity assist;With armrests;To chair/3-in-1 Details for Transfer Assistance: verbal cues for safe technique (ie hand placement, L LE forward) Ambulation/Gait Ambulation/Gait Assistance: 4: Min assist Ambulation Distance (Feet): 100 Feet Assistive device: Rolling walker Ambulation/Gait Assistance Details: verbal cues for seqence, speed and positiopn inside RW Gait Pattern: Step-to pattern;Decreased step length - left;Decreased stance time - left    Exercises     PT Diagnosis:    PT Problem List:   PT Treatment Interventions:     PT Goals Acute Rehab PT Goals Pt will go Sit to Stand: with modified independence PT Goal: Sit to Stand - Progress: Progressing toward goal Pt will go Stand to Sit: with  modified independence PT Goal: Stand to Sit - Progress: Progressing toward goal Pt will Ambulate: 51 - 150 feet;with modified independence;with least restrictive assistive device PT Goal: Ambulate - Progress: Progressing toward goal  Visit Information  Last PT Received On: 03/02/12 Assistance Needed: +1    Subjective Data  Subjective: I will walk a little. it really started to hurt last night.   Cognition  Overall Cognitive Status: Appears within functional limits for tasks assessed/performed Arousal/Alertness: Awake/alert Orientation Level: Appears intact for tasks assessed Behavior During Session: [facility] for tasks performed    Balance     End of Session PT - End of Session Equipment Utilized During Treatment: Left knee immobilizer Activity Tolerance: Patient tolerated treatment well Patient left: in chair;with call bell/phone within reach;with family/visitor present Nurse Communication: Mobility status CPM Left Knee CPM Left Knee: Off   GP     Steven [name] 03/02/2012, 11:32 AM [phone]

## 2012-03-02 NOTE — Treatment Plan (Signed)
Pt states ortho MD discontinued CPM due to back pain. Wife states same.

## 2012-03-02 NOTE — Progress Notes (Signed)
Physical Therapy Treatment Patient Details Name: Steven [name] MRN: [id number] DOB: Jul 28, 1940 Today's Date: 03/02/2012 Time: 1914-7829 PT Time Calculation (min): 35 min  PT Assessment / Plan / Recommendation Comments on Treatment Session  Pt. states he feels weak this PM and is in pain. Pt. did ambulate but seems to be IN PAIN    Follow Up Recommendations  Home health PT     Does the patient have the potential to tolerate intense rehabilitation     Barriers to Discharge        Equipment Recommendations  Rolling walker with 5" wheels    Recommendations for Other Services    Frequency 7X/week   Plan Discharge plan remains appropriate;Frequency remains appropriate    Precautions / Restrictions Precautions Precautions: Knee Required Braces or Orthoses: Knee Immobilizer - Left Knee Immobilizer - Left: Discontinue once straight leg raise with < 10 degree lag   Pertinent Vitals/Pain 7/10 l THIGH.  BP after ambulatin 158/99, sats 96 RA and HR104. RN aware.    Mobility  Bed Mobility Supine to Sit: 4: Min assist Details for Bed Mobility Assistance: min assist for LLE support. Transfers Transfers: Sit to Stand;Stand to Sit Sit to Stand: 4: Min assist;From bed;From elevated surface;With upper extremity assist Stand to Sit: To chair/3-in-1;With upper extremity assist;4: Min assist Details for Transfer Assistance: verbal cues for safe technique (ie hand placement, L LE forward Ambulation/Gait Ambulation/Gait Assistance:  (+ 1 with recliner due to pt. c/o dizziness) Ambulation Distance (Feet): 200 Feet Assistive device: Rolling walker Gait Pattern: Step-through pattern;Decreased stance time - left    Exercises     PT Diagnosis:    PT Problem List:   PT Treatment Interventions:     PT Goals Acute Rehab PT Goals Pt will go Supine/Side to Sit: with modified independence PT Goal: Supine/Side to Sit - Progress: Progressing toward goal Pt will go Sit to Stand: with modified  independence PT Goal: Sit to Stand - Progress: Progressing toward goal Pt will go Stand to Sit: with modified independence PT Goal: Stand to Sit - Progress: Progressing toward goal Pt will Ambulate: 51 - 150 feet;with modified independence;with least restrictive assistive device PT Goal: Ambulate - Progress: Progressing toward goal Pt will Perform Home Exercise Program: with supervision, verbal cues required/provided PT Goal: Perform Home Exercise Program - Progress: Progressing toward goal  Visit Information  Last PT Received On: 03/02/12 Assistance Needed: +2 (pt. c/o dizziness.)    Subjective Data  Subjective: I just don't feel like eating.. I feel weak   Cognition  Overall Cognitive Status: Appears within functional limits for tasks assessed/performed Arousal/Alertness: Awake/alert    Balance     End of Session PT - End of Session Activity Tolerance: Patient limited by fatigue;Patient limited by pain Patient left: in chair;with call bell/phone within reach;with family/visitor present Nurse Communication: Mobility status CPM Left Knee CPM Left Knee: Off   GP     Rada Hay 03/02/2012, 3:56 PM

## 2012-03-02 NOTE — Progress Notes (Signed)
Orthopedic Tech Progress Note Patient Details:  Steven [name] 11-Aug-1940 [id number]  Patient ID: [name], male   DOB: 09-25-1940, 71 y.o.   MRN: [id number]   [name] 03/02/2012, 2:57 PM Pt refused cpm

## 2012-03-02 NOTE — Progress Notes (Signed)
Physical Therapy Treatment Patient Details Name: Steven [name] MRN: [id number] DOB: 02/10/1941 Today's Date: 03/02/2012 Time: 4098-1191 PT Time Calculation (min): 23 min  PT Assessment / Plan / Recommendation Comments on Treatment Session  tolerated exercises. still unable to SLR. will ambulate again.     Follow Up Recommendations  Home health PT     Does the patient have the potential to tolerate intense rehabilitation     Barriers to Discharge        Equipment Recommendations  Rolling walker with 5" wheels    Recommendations for Other Services    Frequency 7X/week   Plan Discharge plan remains appropriate    Precautions / Restrictions Precautions Precautions: Knee Required Braces or Orthoses: Knee Immobilizer - Left Knee Immobilizer - Left: Discontinue once straight leg raise with < 10 degree lag Restrictions Weight Bearing Restrictions: No LLE Weight Bearing: Weight bearing as tolerated   Pertinent Vitals/Pain States L thigh is sore. ice applied.    Mobility  T   Exercises Total Joint Exercises Quad Sets: AROM;Left;10 reps;Supine Heel Slides: AAROM;Left;Supine;10 reps Straight Leg Raises: AAROM;Left;20 reps;Supine   PT Diagnosis:    PT Problem List:   PT Treatment Interventions:     PT Goals Acute Rehab PT Goals Pt will go Sit to Stand: with modified independence PT Goal: Sit to Stand - Progress: Progressing toward goal Pt will go Stand to Sit: with modified independence PT Goal: Stand to Sit - Progress: Progressing toward goal Pt will Ambulate: 51 - 150 feet;with modified independence;with least restrictive assistive device PT Goal: Ambulate - Progress: Progressing toward goal  Visit Information  Last PT Received On: 03/02/12 Assistance Needed: +1    Subjective Data  Subjective: i have walked twice   Cognition  Overall Cognitive Status: Appears within functional limits for tasks assessed/performed Arousal/Alertness: Awake/alert Orientation Level:  Appears intact for tasks assessed Behavior During Session: [facility] for tasks performed    Balance     End of Session PT - End of Session Equipment Utilized During Treatment: Left knee immobilizer Activity Tolerance: Patient tolerated treatment well Patient left: in bed;with call bell/phone within reach;with family/visitor present Nurse Communication: Mobility status CPM Left Knee CPM Left Knee: Off   GP     Rada Hay 03/02/2012, 11:35 AM

## 2012-03-03 LAB — BASIC METABOLIC PANEL
BUN: 13 mg/dL (ref 6–23)
CO2: 27 mEq/L (ref 19–32)
Chloride: 101 mEq/L (ref 96–112)
Creatinine, Ser: 0.97 mg/dL (ref 0.50–1.35)
GFR calc Af Amer: >90 mL/min (ref >90)

## 2012-03-03 LAB — CBC
HCT: 34.9 % — ABNORMAL LOW (ref 39.0–52.0)
MCH: 30.9 pg (ref 26.0–34.0)
MCV: 90.6 fL (ref 78.0–100.0)
RDW: 13.2 % (ref 11.5–15.5)
WBC: 10.3 10*3/uL (ref 4.0–10.5)

## 2012-03-03 MED ORDER — HYDROCHLOROTHIAZIDE 12.5 MG PO CAPS
12.5000 mg | ORAL_CAPSULE | Freq: Every day | ORAL | Status: DC
  Administered 2012-03-03: 12.5 mg via ORAL
  Filled 2012-03-03: qty 1

## 2012-03-03 MED ORDER — LISINOPRIL-HYDROCHLOROTHIAZIDE 10-12.5 MG PO TABS: 1.0000 | ORAL_TABLET | ORAL | Status: DC

## 2012-03-03 MED ORDER — HYDROMORPHONE HCL 4 MG PO TABS: 4.0000 mg | ORAL_TABLET | ORAL | Status: DC | PRN

## 2012-03-03 MED ORDER — METHOCARBAMOL 500 MG PO TABS: 500.0000 mg | ORAL_TABLET | Freq: Four times a day (QID) | ORAL | Status: DC | PRN

## 2012-03-03 MED ORDER — LISINOPRIL 10 MG PO TABS
10.0000 mg | ORAL_TABLET | Freq: Every day | ORAL | Status: DC
  Administered 2012-03-03: 10 mg via ORAL
  Filled 2012-03-03: qty 1

## 2012-03-03 MED ORDER — RIVAROXABAN 10 MG PO TABS: 10.0000 mg | ORAL_TABLET | Freq: Every day | ORAL | Status: DC

## 2012-03-03 NOTE — Progress Notes (Signed)
Discharge summary sent to payer through [facility]  

## 2012-03-03 NOTE — Progress Notes (Signed)
Physical Therapy Treatment Patient Details Name: Steven [name] MRN: [id number] DOB: 1941-03-04 Today's Date: 03/03/2012 Time: 0922-0950 PT Time Calculation (min): 28 min  PT Assessment / Plan / Recommendation Comments on Treatment Session  POD # 2 L TKR am session.  Pt very eager to go home today.  Pt required MAX VC's to decrease gait speed to increase safety and WB thru l LE.  Pt given handout on TKR TE's. Pt plans to D/C to home after 2 PT sessions and waiting for his RW to be delivered.    Follow Up Recommendations  Home health PT     Does the patient have the potential to tolerate intense rehabilitation     Barriers to Discharge        Equipment Recommendations  Rolling walker with 5" wheels    Recommendations for Other Services    Frequency 7X/week   Plan Discharge plan remains appropriate    Precautions / Restrictions Precautions Precautions: Knee Precaution Comments: Pt instructed on L KI use for amb and when to D/C Required Braces or Orthoses: Knee Immobilizer - Left Knee Immobilizer - Left: Discontinue once straight leg raise with < 10 degree lag Restrictions Weight Bearing Restrictions: No LLE Weight Bearing: Weight bearing as tolerated    Pertinent Vitals/Pain C/o 4/10 during gait C/o 7/10 during TE's ICE applied    Mobility  Bed Mobility Bed Mobility: Not assessed Details for Bed Mobility Assistance: Pt OOB in recliner  Transfers Transfers: Sit to Stand;Stand to Sit Sit to Stand: 4: Min guard;From chair/3-in-1 Stand to Sit: 4: Min guard;To chair/3-in-1 Details for Transfer Assistance: one VC for safety with turns and backward gait to chair  Ambulation/Gait Ambulation/Gait Assistance: 4: Min guard Ambulation Distance (Feet): 220 Feet Assistive device: Rolling walker Ambulation/Gait Assistance Details: 50% VC's to reduce gait speed and increase hell stike and WB thru L LE. Gait Pattern: Step-to pattern;Decreased stance time - left    Exercises Total  Joint Exercises Ankle Circles/Pumps: AROM;Both;10 reps;Supine Quad Sets: AROM;Both;10 reps;Supine Gluteal Sets: AROM;Both;10 reps;Supine Towel Squeeze: AROM;Both;10 reps;Supine Short Arc Quad: AAROM;Left;10 reps;Supine Heel Slides: AAROM;Left;10 reps;Supine Hip ABduction/ADduction: AAROM;Left;10 reps;Supine Straight Leg Raises: AAROM;Left;10 reps;Supine    PT Goals                                                               progressing    Visit Information  Last PT Received On: 03/03/12 Assistance Needed: +1    Subjective Data  Subjective: I am ready to go home   Cognition    good   Balance   fair with RW  End of Session PT - End of Session Equipment Utilized During Treatment: Gait belt;Left knee immobilizer Activity Tolerance: Patient tolerated treatment well Patient left: in chair;with call bell/phone within reach;with family/visitor present (ICE to L knee)   Felecia Shelling  PTA [facility]  Acute  Rehab Pager     [phone]

## 2012-03-03 NOTE — Progress Notes (Signed)
Subjective: 2 Days Post-Op Procedure(s) (LRB): TOTAL KNEE ARTHROPLASTY (Left) Patient reports pain as mild.   Patient seen in rounds for Dr. Lequita Halt.  Wife in room. Patient is well, but has had some minor complaints of pain in the knee, requiring pain medications Patient is ready to go home.  Objective: Vital signs in last 24 hours: Temp:  [97.6 F (36.4 C)-98.7 F (37.1 C)] 97.6 F (36.4 C) (11/20 0538) Pulse Rate:  [86-97] 86  (11/20 0538) Resp:  [15-18] 18  (11/20 0757) BP: (128-147)/(77-94) 147/94 mmHg (11/20 0538) SpO2:  [95 %-99 %] 96 % (11/20 0538)  Intake/Output from previous day:  Intake/Output Summary (Last 24 hours) at 03/03/12 0809 Last data filed at 03/03/12 0758  Gross per 24 hour  Intake    960 ml  Output   3501 ml  Net  -2541 ml    Intake/Output this shift: Total I/O In: 240 [P.O.:240] Out: -   Labs:  Basename 03/03/12 0430 03/02/12 0445  HGB 11.9* 12.0*    Basename 03/03/12 0430 03/02/12 0445  WBC 10.3 7.9  RBC 3.85* 3.86*  HCT 34.9* 35.0*  PLT 166 150    Basename 03/03/12 0430 03/02/12 0445  NA 136 132*  K 4.5 4.4  CL 101 98  CO2 27 25  BUN 13 19  CREATININE 0.97 0.91  GLUCOSE 128* 142*  CALCIUM 9.1 8.4   No results found for this basename: LABPT:2,INR:2 in the last 72 hours  EXAM: General - Patient is Alert, Appropriate and Oriented Extremity - Neurovascular intact Sensation intact distally Dorsiflexion/Plantar flexion intact No cellulitis present Incision - clean, dry, no drainage, healing Motor Function - intact, moving foot and toes well on exam.   Assessment/Plan: 2 Days Post-Op Procedure(s) (LRB): TOTAL KNEE ARTHROPLASTY (Left) Procedure(s) (LRB): TOTAL KNEE ARTHROPLASTY (Left) Past Medical History  Diagnosis Date  . Obesity   . Dyslipidemia   . Coronary artery disease 02-27-11    Dr. Marliss Coots follows-has some blocked coronary arteries ,not suitable for stent  placement  . Hypertension   . Shingles  outbreak 02-27-11    2 weeks ago , was tx.-only residual is tenderness of right scalp-no open areas  . GERD (gastroesophageal reflux disease) 02-27-11    Acid reflux  . Hemorrhoids 02-27-11    not bothersome at this time  . Hearing loss 02-27-11    Bilateral hearing aids-due to exposure to loud machinery  . Urinary incontinence 02-27-11    not an everday occurrence-no special measures  . Osteoarthritis 02-27-11    Ostearthritis-knees, shoulders.Back causes chronic pain-radiates down right leg  . Chronic pain   . Hyperlipidemia   . Leg swelling   . Rash   . Complication of anesthesia     pt woke up during last surgery   . Dysrhythmia     hx of atrial fib during last hospitalization   . Shortness of breath     with exertion    Principal Problem:  *OA (osteoarthritis) of knee Active Problems:  OTHER DYSPNEA AND RESPIRATORY ABNORMALITIES  Postop Hyponatremia  Estimated Body mass index is 39.00 kg/(m^2) as calculated from the following:   Height as of this encounter: 5\' 11" (1.803 m).   Weight as of this encounter: 279 lb 9.6 oz(126.826 kg). Discharge home with home health Diet - Cardiac diet Follow up - in 2 weeks Activity - WBAT Disposition - Home Condition Upon Discharge - Good D/C Meds - See DC Summary DVT Prophylaxis - Xarelto, 81 mg Aspirin on hold  PERKINS, ALEXZANDREW 03/03/2012, 8:09 AM

## 2012-03-03 NOTE — Progress Notes (Signed)
Physical Therapy Treatment Patient Details Name: Steven [name] MRN: [id number] DOB: 02-Mar-1941 Today's Date: 03/03/2012 Time: 1208-1223 PT Time Calculation (min): 15 min  PT Assessment / Plan / Recommendation Comments on Treatment Session  POD # 2 pm session.  Pt stated, "My knee really hurts from those exercises".  practiced going up/down 2 steps backward up due to no rails. RW delivered and in room.  Pt ready for D/C to home.    Follow Up Recommendations  Home health PT     Does the patient have the potential to tolerate intense rehabilitation     Barriers to Discharge        Equipment Recommendations  Rolling walker with 5" wheels    Recommendations for Other Services    Frequency 7X/week   Plan Discharge plan remains appropriate    Precautions / Restrictions Precautions Precautions: Knee Precaution Comments: Pt instructed on L KI use for amb and when to D/C Required Braces or Orthoses: Knee Immobilizer - Left Knee Immobilizer - Left: Discontinue once straight leg raise with < 10 degree lag Restrictions Weight Bearing Restrictions: No LLE Weight Bearing: Weight bearing as tolerated    Pertinent Vitals/Pain C/o 7/10 l Knee pain Just received pain meds Applied ICE    Mobility  Bed Mobility Bed Mobility: Not assessed Details for Bed Mobility Assistance: Pt OOB in recliner  Transfers Transfers: Sit to Stand;Stand to Sit Sit to Stand: 5: Supervision;From chair/3-in-1 Stand to Sit: 5: Supervision;To chair/3-in-1 Details for Transfer Assistance: one VC for safety with turns and backward gait to chair   Ambulation/Gait Ambulation/Gait Assistance: 5: Supervision Ambulation Distance (Feet): 45 Feet Assistive device: Rolling walker Ambulation/Gait Assistance Details: 25% VC's on safety with turns and to reduce gait speed to increase safety Gait Pattern: Step-to pattern;Decreased stance time - left Stairs: Yes Stairs Assistance: 4: Min assist Stairs Assistance Details  (indicate cue type and reason): demonstrated with spouse Stair Management Technique: No rails;With walker;Backwards Number of Stairs: 2         PT Goals                                                                progressing    Visit Information  Last PT Received On: 03/03/12 Assistance Needed: +1    Subjective Data  Subjective: I am ready to go home   Cognition       Balance     End of Session PT - End of Session Equipment Utilized During Treatment: Gait belt Activity Tolerance: Patient tolerated treatment well Patient left: in chair;with call bell/phone within reach;with family/visitor present Nurse Communication: Other (comment) (Pt ready for D/C to home)   Felecia Shelling  PTA [facility]  Acute  Rehab Pager     [phone]

## 2012-03-03 NOTE — Progress Notes (Signed)
Pt to d/c home with home health. Walker delivered to room before discharge. AVS reviewed and "My Chart" discussed with pt. Pt capable of verbalizing medications and follow-up appointments. Remains hemodynamically stable. No signs and symptoms of distress. Educated pt to return to ER in the case of SOB, dizziness, or chest pain.

## 2012-03-03 NOTE — Discharge Summary (Signed)
Physician Discharge Summary   Patient ID: Steven [name] MRN: [id number] DOB/AGE: [id number] 70 y.o.  Admit date: 03/01/2012 Discharge date: 03/15/2012  Primary Diagnosis: Osteoarthritis Left knee   Admission Diagnoses:  Past Medical History  Diagnosis Date  . Obesity   . Dyslipidemia   . Coronary artery disease 02-27-11    Dr. Marliss Coots follows-has some blocked coronary arteries ,not suitable for stent  placement  . Hypertension   . Shingles outbreak 02-27-11    2 weeks ago , was tx.-only residual is tenderness of right scalp-no open areas  . GERD (gastroesophageal reflux disease) 02-27-11    Acid reflux  . Hemorrhoids 02-27-11    not bothersome at this time  . Hearing loss 02-27-11    Bilateral hearing aids-due to exposure to loud machinery  . Urinary incontinence 02-27-11    not an everday occurrence-no special measures  . Osteoarthritis 02-27-11    Ostearthritis-knees, shoulders.Back causes chronic pain-radiates down right leg  . Chronic pain   . Hyperlipidemia   . Leg swelling   . Rash   . Complication of anesthesia     pt woke up during last surgery   . Dysrhythmia     hx of atrial fib during last hospitalization   . Shortness of breath     with exertion    Discharge Diagnoses:   Principal Problem:  *OA (osteoarthritis) of knee Active Problems:  OTHER DYSPNEA AND RESPIRATORY ABNORMALITIES  Postop Hyponatremia  Postop Acute blood loss anemia  Estimated Body mass index is 39.00 kg/(m^2) as calculated from the following:   Height as of this encounter: 5\' 11" (1.803 m).   Weight as of this encounter: 279 lb 9.6 oz(126.826 kg).  Classification of overweight in adults according to BMI (WHO, 1998)   Procedure:  Procedure(s) (LRB): TOTAL KNEE ARTHROPLASTY (Left)   Consults: None  HPI: Steven [name] is a 71 y.o. year old male with end stage OA of his left knee with progressively worsening pain and dysfunction. He has constant pain, with activity and  at rest and significant functional deficits with difficulties even with ADLs. He has had extensive non-op management including analgesics, injections of cortisone and viscosupplements, and home exercise program, but remains in significant pain with significant dysfunction. Radiographs show bone on bone arthritis medial compartment. He presents now for left Total Knee Arthroplasty.   Laboratory Data: Admission on 03/01/2012, Discharged on 03/03/2012  Component Date Value Range Status  . ABO/RH(D) 03/01/2012 A POS   Final  . Antibody Screen 03/01/2012 NEG   Final  . Sample Expiration 03/01/2012 03/04/2012   Final  . WBC 03/02/2012 7.9  4.0 - 10.5 K/uL Final  . RBC 03/02/2012 3.86* 4.22 - 5.81 MIL/uL Final  . Hemoglobin 03/02/2012 12.0* 13.0 - 17.0 g/dL Final  . HCT 40/98/1191 35.0* 39.0 - 52.0 % Final  . MCV 03/02/2012 90.7  78.0 - 100.0 fL Final  . MCH 03/02/2012 31.1  26.0 - 34.0 pg Final  . MCHC 03/02/2012 34.3  30.0 - 36.0 g/dL Final  . RDW 47/82/9562 13.0  11.5 - 15.5 % Final  . Platelets 03/02/2012 150  150 - 400 K/uL Final  . Sodium 03/02/2012 132* 135 - 145 mEq/L Final  . Potassium 03/02/2012 4.4  3.5 - 5.1 mEq/L Final  . Chloride 03/02/2012 98  96 - 112 mEq/L Final  . CO2 03/02/2012 25  19 - 32 mEq/L Final  . Glucose, Bld 03/02/2012 142* 70 - 99 mg/dL Final  . BUN 13/11/6576 19  6 - 23 mg/dL Final  . Creatinine, Ser 03/02/2012 0.91  0.50 - 1.35 mg/dL Final  . Calcium 16/01/9603 8.4  8.4 - 10.5 mg/dL Final  . GFR calc non Af Amer 03/02/2012 84* >90 mL/min Final  . GFR calc Af Amer 03/02/2012 >90  >90 mL/min Final   Comment:                                 The eGFR has been calculated                          using the CKD EPI equation.                          This calculation has not been                          validated in all clinical                          situations.                          eGFR's persistently                          <90 mL/min signify                           possible Chronic Kidney Disease.  . WBC 03/03/2012 10.3  4.0 - 10.5 K/uL Final  . RBC 03/03/2012 3.85* 4.22 - 5.81 MIL/uL Final  . Hemoglobin 03/03/2012 11.9* 13.0 - 17.0 g/dL Final  . HCT 54/12/8117 34.9* 39.0 - 52.0 % Final  . MCV 03/03/2012 90.6  78.0 - 100.0 fL Final  . MCH 03/03/2012 30.9  26.0 - 34.0 pg Final  . MCHC 03/03/2012 34.1  30.0 - 36.0 g/dL Final  . RDW 14/78/2956 13.2  11.5 - 15.5 % Final  . Platelets 03/03/2012 166  150 - 400 K/uL Final  . Sodium 03/03/2012 136  135 - 145 mEq/L Final  . Potassium 03/03/2012 4.5  3.5 - 5.1 mEq/L Final  . Chloride 03/03/2012 101  96 - 112 mEq/L Final  . CO2 03/03/2012 27  19 - 32 mEq/L Final  . Glucose, Bld 03/03/2012 128* 70 - 99 mg/dL Final  . BUN 21/30/8657 13  6 - 23 mg/dL Final  . Creatinine, Ser 03/03/2012 0.97  0.50 - 1.35 mg/dL Final  . Calcium 84/69/6295 9.1  8.4 - 10.5 mg/dL Final  . GFR calc non Af Amer 03/03/2012 82* >90 mL/min Final  . GFR calc Af Amer 03/03/2012 >90  >90 mL/min Final   Comment:                                 The eGFR has been calculated                          using the CKD EPI equation.                          This calculation has not  been                          validated in all clinical                          situations.                          eGFR's persistently                          <90 mL/min signify                          possible Chronic Kidney Disease.  Hospital Outpatient Visit on 02/24/2012  Component Date Value Range Status  . aPTT 02/24/2012 29  24 - 37 seconds Final  . WBC 02/24/2012 7.0  4.0 - 10.5 K/uL Final  . RBC 02/24/2012 4.70  4.22 - 5.81 MIL/uL Final  . Hemoglobin 02/24/2012 14.7  13.0 - 17.0 g/dL Final  . HCT 45/40/9811 42.4  39.0 - 52.0 % Final  . MCV 02/24/2012 90.2  78.0 - 100.0 fL Final  . MCH 02/24/2012 31.3  26.0 - 34.0 pg Final  . MCHC 02/24/2012 34.7  30.0 - 36.0 g/dL Final  . RDW 91/47/8295 13.0  11.5 - 15.5 % Final  . Platelets 02/24/2012  212  150 - 400 K/uL Final  . Sodium 02/24/2012 135  135 - 145 mEq/L Final  . Potassium 02/24/2012 4.1  3.5 - 5.1 mEq/L Final  . Chloride 02/24/2012 98  96 - 112 mEq/L Final  . CO2 02/24/2012 29  19 - 32 mEq/L Final  . Glucose, Bld 02/24/2012 104* 70 - 99 mg/dL Final  . BUN 62/13/0865 21  6 - 23 mg/dL Final  . Creatinine, Ser 02/24/2012 0.95  0.50 - 1.35 mg/dL Final  . Calcium 78/46/9629 9.5  8.4 - 10.5 mg/dL Final  . Total Protein 02/24/2012 6.9  6.0 - 8.3 g/dL Final  . Albumin 52/84/1324 3.6  3.5 - 5.2 g/dL Final  . AST 40/01/2724 31  0 - 37 U/L Final  . ALT 02/24/2012 19  0 - 53 U/L Final  . Alkaline Phosphatase 02/24/2012 83  39 - 117 U/L Final  . Total Bilirubin 02/24/2012 0.4  0.3 - 1.2 mg/dL Final  . GFR calc non Af Amer 02/24/2012 82* >90 mL/min Final  . GFR calc Af Amer 02/24/2012 >90  >90 mL/min Final   Comment:                                 The eGFR has been calculated                          using the CKD EPI equation.                          This calculation has not been                          validated in all clinical                          situations.  eGFR's persistently                          <90 mL/min signify                          possible Chronic Kidney Disease.  Marland Kitchen Prothrombin Time 02/24/2012 12.2  11.6 - 15.2 seconds Final  . INR 02/24/2012 0.91  0.00 - 1.49 Final  . Color, Urine 02/24/2012 YELLOW  YELLOW Final  . APPearance 02/24/2012 CLEAR  CLEAR Final  . Specific Gravity, Urine 02/24/2012 1.021  1.005 - 1.030 Final  . pH 02/24/2012 5.5  5.0 - 8.0 Final  . Glucose, UA 02/24/2012 NEGATIVE  NEGATIVE mg/dL Final  . Hgb urine dipstick 02/24/2012 NEGATIVE  NEGATIVE Final  . Bilirubin Urine 02/24/2012 NEGATIVE  NEGATIVE Final  . Ketones, ur 02/24/2012 NEGATIVE  NEGATIVE mg/dL Final  . Protein, ur 16/01/9603 NEGATIVE  NEGATIVE mg/dL Final  . Urobilinogen, UA 02/24/2012 0.2  0.0 - 1.0 mg/dL Final  . Nitrite 54/12/8117 NEGATIVE   NEGATIVE Final  . Leukocytes, UA 02/24/2012 NEGATIVE  NEGATIVE Final   MICROSCOPIC NOT DONE ON URINES WITH NEGATIVE PROTEIN, BLOOD, LEUKOCYTES, NITRITE, OR GLUCOSE <1000 mg/dL.  Marland Kitchen MRSA, PCR 02/24/2012 NEGATIVE  NEGATIVE Final  . Staphylococcus aureus 02/24/2012 NEGATIVE  NEGATIVE Final   Comment:                                 The Xpert SA Assay (FDA                          approved for NASAL specimens                          in patients over 89 years of age),                          is one component of                          a comprehensive surveillance                          program.  Test performance has                          been validated by [employer] for patients greater                          than or equal to 76 year old.                          It is not intended                          to diagnose infection nor to                          guide or monitor treatment.  X-Rays:Dg Chest 2 View  02/24/2012  *RADIOLOGY REPORT*  Clinical Data: Preoperative examination (left knee), history of hypertension, former smoker, mild shortness of breath with exertion.  CHEST - 2 VIEW  Comparison: 03/18/2011; 03/14/2011; 02/19/2010; chest CT - 03/16/2011  Findings:  Grossly unchanged cardiac silhouette and mediastinal contours with mild tortuosity the thoracic aorta.  Interval resolution of right- sided pleural effusion and right basilar heterogeneous / consolidative airspace opacities.  Minimal left basilar linear subsegmental atelectasis.  No focal airspace opacities.  No pleural effusion or pneumothorax.  Unchanged bones.  Post cholecystectomy.  IMPRESSION: 1.  No acute cardiopulmonary disease. 2.  Interval resolution of right-sided effusion and right basilar consolidative opacities.   Original Report Authenticated By: Tacey Ruiz, MD     EKG: Orders placed during the hospital encounter of 03/01/12  . EKG     Hospital Course: Steven [name] is a  71 y.o. who was admitted to [facility]. They were brought to the operating room on 03/01/2012 and underwent Procedure(s): TOTAL KNEE ARTHROPLASTY.  Patient tolerated the procedure well and was later transferred to the recovery room and then to the telemetry floor for postoperative care.  They were given PO and IV analgesics for pain control following their surgery.  They were given 24 hours of postoperative antibiotics of  Anti-infectives     Start     Dose/Rate Route Frequency Ordered Stop   03/01/12 1300   ceFAZolin (ANCEF) IVPB 2 g/50 mL premix        2 g 100 mL/hr over 30 Minutes Intravenous Every 6 hours 03/01/12 1017 03/01/12 2148   03/01/12 0530   ceFAZolin (ANCEF) 3 g in dextrose 5 % 50 mL IVPB        3 g 160 mL/hr over 30 Minutes Intravenous 60 min pre-op 03/01/12 0530 03/01/12 0715         and started on DVT prophylaxis in the form of Xarelto, 81 mg Aspirin was on hold.  PT and OT were ordered for total joint protocol.  Discharge planning consulted to help with postop disposition and equipment needs.  Patient had a decent night on the evening of surgery and started to get up OOB with therapy on day one walking about 60 feet.  Hemovac drain was pulled without difficulty.  Continued to work with therapy into day two walking about 100 feet.  Dressing was changed on day two and the incision was healing well.  Incision was healing well.  Patient was seen in rounds and was ready to go home.   Discharge Medications: Prior to Admission medications   Medication Sig Start Date End Date Taking? Authorizing Provider  atorvastatin (LIPITOR) 40 MG tablet Take 40 mg by mouth at bedtime.    Yes Historical Provider, MD  cetirizine (ZYRTEC) 10 MG tablet Take 10 mg by mouth as needed.    Yes Historical Provider, MD  lisinopril-hydrochlorothiazide (PRINZIDE,ZESTORETIC) 10-12.5 MG per tablet Take 1 tablet by mouth every morning.    Yes Historical Provider, MD  Methylcellulose, Laxative,  (CITRUCEL) 500 MG TABS Take 1 tablet by mouth daily.    Yes Historical Provider, MD  omeprazole (PRILOSEC) 20 MG capsule Take 20 mg by mouth every morning.    Yes Historical Provider, MD  polyethylene glycol (MIRALAX) packet Take 17 g by mouth daily as needed. constipation   Yes Historical Provider, MD  PRESCRIPTION MEDICATION Apply 1 application topically 4 (four) times daily as needed. Derma TRIM.  This is an compound drug  from an [employer]. [facility] is the provider. Pt uses for hip and back pain   Yes Historical Provider, MD  senna-docusate (SENNA PLUS) 8.6-50 MG per tablet Take 1 tablet by mouth 2 (two) times daily as needed. Constipation.  Pt takes one every day but sometimes takes another dose.   Yes Historical Provider, MD  HYDROmorphone (DILAUDID) 4 MG tablet Take 1 tablet (4 mg total) by mouth every 4 (four) hours as needed. Pain 03/03/12   Alexzandrew Julien Girt, PA  methocarbamol (ROBAXIN) 500 MG tablet Take 1 tablet (500 mg total) by mouth every 6 (six) hours as needed. 03/03/12   Alexzandrew Julien Girt, PA  rivaroxaban (XARELTO) 10 MG TABS tablet Take 1 tablet (10 mg total) by mouth daily with breakfast. Take Xarelto for two and a half more weeks, then discontinue Xarelto. Once the patient has completed the Xarelto, they may resume the 81 mg Aspirin. 03/03/12   Alexzandrew Perkins, PA    Diet: Cardiac diet Activity:WBAT Follow-up:in 2 weeks Disposition - Home Discharged Condition: good       Discharge Orders    Future Orders Please Complete By Expires   Diet - low sodium heart healthy      Call MD / Call 911      Comments:   If you experience chest pain or shortness of breath, CALL 911 and be transported to the hospital emergency room.  If you develope a fever above 101 F, pus (white drainage) or increased drainage or redness at the wound, or calf pain, call your surgeon's office.   Discharge instructions      Comments:   Pick up stool softner and  laxative for home. Do not submerge incision under water. May shower. Continue to use ice for pain and swelling from surgery.  Take Xarelto for two and a half more weeks, then discontinue Xarelto. Once the patient has completed the Xarelto, they may resume the 81 mg Aspirin.   Constipation Prevention      Comments:   Drink plenty of fluids.  Prune juice may be helpful.  You may use a stool softener, such as Colace (over the counter) 100 mg twice a day.  Use MiraLax (over the counter) for constipation as needed.   Increase activity slowly as tolerated      Patient may shower      Comments:   You may shower without a dressing once there is no drainage.  Do not wash over the wound.  If drainage remains, do not shower until drainage stops.   Weight bearing as tolerated      Driving restrictions      Comments:   No driving until released by the physician.   Lifting restrictions      Comments:   No lifting until released by the physician.   TED hose      Comments:   Use stockings (TED hose) for 3 weeks on both leg(s).  You may remove them at night for sleeping.   Change dressing      Comments:   Change dressing daily with sterile 4 x 4 inch gauze dressing and apply TED hose. Do not submerge the incision under water.   Do not put a pillow under the knee. Place it under the heel.      Do not sit on low chairs, stoools or toilet seats, as it may be difficult to get up from low surfaces          Medication List  As of 03/15/2012  8:19 AM    STOP taking these medications         aspirin 81 MG tablet      cholecalciferol 1000 UNITS tablet   Commonly known as: VITAMIN D      fish oil-omega-3 fatty acids 1000 MG capsule      TAKE these medications         atorvastatin 40 MG tablet   Commonly known as: LIPITOR   Take 40 mg by mouth at bedtime.      cetirizine 10 MG tablet   Commonly known as: ZYRTEC   Take 10 mg by mouth as needed.      CITRUCEL 500 MG Tabs   Generic drug:  Methylcellulose (Laxative)   Take 1 tablet by mouth daily.      HYDROmorphone 4 MG tablet   Commonly known as: DILAUDID   Take 1 tablet (4 mg total) by mouth every 4 (four) hours as needed. Pain      lisinopril-hydrochlorothiazide 10-12.5 MG per tablet   Commonly known as: PRINZIDE,ZESTORETIC   Take 1 tablet by mouth every morning.      methocarbamol 500 MG tablet   Commonly known as: ROBAXIN   Take 1 tablet (500 mg total) by mouth every 6 (six) hours as needed.      MIRALAX packet   Generic drug: polyethylene glycol   Take 17 g by mouth daily as needed. constipation      omeprazole 20 MG capsule   Commonly known as: PRILOSEC   Take 20 mg by mouth every morning.      PRESCRIPTION MEDICATION   Apply 1 application topically 4 (four) times daily as needed. Derma TRIM.  This is an compound drug from an [employer]. [facility] is the provider. Pt uses for hip and back pain      rivaroxaban 10 MG Tabs tablet   Commonly known as: XARELTO   Take 1 tablet (10 mg total) by mouth daily with breakfast. Take Xarelto for two and a half more weeks, then discontinue Xarelto.  Once the patient has completed the Xarelto, they may resume the 81 mg Aspirin.      SENNA PLUS 8.6-50 MG per tablet   Generic drug: senna-docusate   Take 1 tablet by mouth 2 (two) times daily as needed. Constipation.  Pt takes one every day but sometimes takes another dose.        Follow-up Information    Follow up with Loanne Drilling, MD. Schedule an appointment as soon as possible for a visit in 2 weeks.   Contact information:   [address], [address] [place] [address] [phone]          Signed: Patrica Duel 03/15/2012, 8:19 AM

## 2012-03-15 DIAGNOSIS — D62 Acute posthemorrhagic anemia: Secondary | ICD-10-CM | POA: Diagnosis not present

## 2012-06-24 ENCOUNTER — Encounter: Payer: Self-pay | Admitting: Cardiovascular Disease

## 2012-06-24 ENCOUNTER — Ambulatory Visit (INDEPENDENT_AMBULATORY_CARE_PROVIDER_SITE_OTHER): Payer: PRIVATE HEALTH INSURANCE | Admitting: Cardiovascular Disease

## 2012-06-24 VITALS — BP 118/76 | HR 68 | Ht 71.0 in | Wt 295.4 lb

## 2012-06-24 DIAGNOSIS — I2510 Atherosclerotic heart disease of native coronary artery without angina pectoris: Secondary | ICD-10-CM

## 2012-06-24 NOTE — Progress Notes (Signed)
HPI:  72 year old gentleman presenting for followup evaluation. The patient has coronary artery disease. He's been managed medically because of no anginal symptoms. He has a history of postoperative atrial fibrillation but has maintained sinus rhythm otherwise. Since I've seen him last he has undergone total knee replacement. This is his third knee replacement surgery. He's gained 15 pounds since his last office visit. He denies chest pain or pressure. He does have shortness of breath with exertion. He denies orthopnea or PND. He's had no palpitations or other complaints. He has not been physically active.  Outpatient Encounter Prescriptions as of 06/24/2012  Medication Sig Dispense Refill  . atorvastatin (LIPITOR) 40 MG tablet Take 40 mg by mouth at bedtime.       . cetirizine (ZYRTEC) 10 MG tablet Take 10 mg by mouth as needed.       . cholecalciferol (VITAMIN D) 1000 UNITS tablet Take 1,000 Units by mouth daily.      Marland Kitchen HYDROmorphone (DILAUDID) 4 MG tablet Take 1 tablet (4 mg total) by mouth every 4 (four) hours as needed. Pain  80 tablet  0  . lisinopril-hydrochlorothiazide (PRINZIDE,ZESTORETIC) 10-12.5 MG per tablet Take 1 tablet by mouth every morning.       . Methylcellulose, Laxative, (CITRUCEL) 500 MG TABS Take 1 tablet by mouth daily.       . Omega-3 Fatty Acids (FISH OIL) 1000 MG CAPS Take 1,000 mg by mouth daily.      Marland Kitchen omeprazole (PRILOSEC) 20 MG capsule Take 20 mg by mouth every morning.       . polyethylene glycol (MIRALAX) packet Take 17 g by mouth daily as needed. constipation      . PRESCRIPTION MEDICATION Apply 1 application topically 4 (four) times daily as needed. Derma TRIM.  This is an compound drug from an [employer]. [facility] is the Pallavi Clifton. Pt uses for hip and back pain      . pseudoephedrine-guaifenesin (MUCINEX D) 60-600 MG per tablet Take 1 tablet by mouth every 12 (twelve) hours as needed for congestion.      . senna-docusate (SENNA PLUS)  8.6-50 MG per tablet Take 1 tablet by mouth 2 (two) times daily as needed. Constipation.  Pt takes one every day but sometimes takes another dose.      . [DISCONTINUED] methocarbamol (ROBAXIN) 500 MG tablet Take 1 tablet (500 mg total) by mouth every 6 (six) hours as needed.  80 tablet  0  . [DISCONTINUED] rivaroxaban (XARELTO) 10 MG TABS tablet Take 1 tablet (10 mg total) by mouth daily with breakfast. Take Xarelto for two and a half more weeks, then discontinue Xarelto. Once the patient has completed the Xarelto, they may resume the 81 mg Aspirin.  19 tablet  0   Facility-Administered Encounter Medications as of 06/24/2012  Medication Dose Route Frequency Cordarious Zeek Last Rate Last Dose  . chlorhexidine (HIBICLENS) 4 % liquid 4 application  60 mL Topical Once Loanne Drilling, MD      . chlorhexidine (HIBICLENS) 4 % liquid 4 application  60 mL Topical Once Loanne Drilling, MD        Allergies  Allergen Reactions  . Ciprofloxacin Rash  . Oxycodone-Acetaminophen Rash and Other (See Comments)    Felt like he was hot    Past Medical History  Diagnosis Date  . Obesity   . Dyslipidemia   . Coronary artery disease 02-27-11    Dr. Marliss Coots follows-has some blocked coronary arteries ,not suitable for stent  placement  . Hypertension   . Shingles outbreak 02-27-11    2 weeks ago , was tx.-only residual is tenderness of right scalp-no open areas  . GERD (gastroesophageal reflux disease) 02-27-11    Acid reflux  . Hemorrhoids 02-27-11    not bothersome at this time  . Hearing loss 02-27-11    Bilateral hearing aids-due to exposure to loud machinery  . Urinary incontinence 02-27-11    not an everday occurrence-no special measures  . Osteoarthritis 02-27-11    Ostearthritis-knees, shoulders.Back causes chronic pain-radiates down right leg  . Chronic pain   . Hyperlipidemia   . Leg swelling   . Rash   . Complication of anesthesia     pt woke up during last surgery   . Dysrhythmia      hx of atrial fib during last hospitalization   . Shortness of breath     with exertion     ROS: Negative except as per HPI  BP 118/76  Pulse 68  Ht 5\' 11"  (1.803 m)  Wt 133.993 kg (295 lb 6.4 oz)  BMI 41.22 kg/m2  PHYSICAL EXAM: Pt is alert and oriented, pleasant obese male in NAD HEENT: normal Neck: JVP - normal, carotids 2+= without bruits Lungs: CTA bilaterally CV: RRR without murmur or gallop Abd: soft, NT, Positive BS, no hepatomegaly Ext: no C/C/E, distal pulses intact and equal Skin: warm/dry no rash  EKG:  Normal sinus rhythm 60 beats per minute, within normal limits.  ASSESSMENT AND PLAN: 1. Coronary artery disease, native vessel. The patient is stable without anginal symptoms. He should take daily aspirin. We discussed the need for major lifestyle modification. He is approaching 300 pounds and his obesity and his adversely affecting his health. I will see him back for 1 year in followup.  2. Hypertension. Blood pressure is controlled. He does admit to some dizziness but is not having episodes of syncope or presyncope. His blood pressure is usually a bit higher than we recorded today.  3. History of postoperative atrial fibrillation. No recurrence.  For followup I will see him back in 12 months.  Tonny Bollman 06/24/2012 2:26 PM

## 2012-06-24 NOTE — Patient Instructions (Addendum)
Your physician wants you to follow-up in: 12 months.  You will receive a reminder letter in the mail two months in advance. If you don't receive a letter, please call our office to schedule the follow-up appointment.  Your physician recommends that you continue on your current medications as directed. Please refer to the Current Medication list given to you today.   

## 2012-07-14 ENCOUNTER — Telehealth: Payer: Self-pay | Admitting: Family Medicine

## 2012-07-14 ENCOUNTER — Other Ambulatory Visit: Payer: Self-pay | Admitting: *Deleted

## 2012-07-14 DIAGNOSIS — E785 Hyperlipidemia, unspecified: Secondary | ICD-10-CM

## 2012-07-14 DIAGNOSIS — Z79899 Other long term (current) drug therapy: Secondary | ICD-10-CM

## 2012-07-14 NOTE — Telephone Encounter (Signed)
Blood work papers for 4-21 app. Requests to have sugar checked also

## 2012-07-14 NOTE — Telephone Encounter (Signed)
Have patient do metabolic 7 and lipid profile

## 2012-07-14 NOTE — Telephone Encounter (Signed)
Patient needs BW paperwork for his appointment on 08-02-12. Also, requesting to have his sugar checked.

## 2012-07-15 ENCOUNTER — Other Ambulatory Visit: Payer: Self-pay | Admitting: *Deleted

## 2012-07-15 MED ORDER — LISINOPRIL-HYDROCHLOROTHIAZIDE 10-12.5 MG PO TABS: 1.0000 | ORAL_TABLET | ORAL | Status: DC

## 2012-07-21 ENCOUNTER — Other Ambulatory Visit: Payer: Self-pay | Admitting: Family Medicine

## 2012-07-21 LAB — BASIC METABOLIC PANEL
CO2: 24 mEq/L (ref 19–32)
Calcium: 8.8 mg/dL (ref 8.4–10.5)
Glucose, Bld: 104 mg/dL — ABNORMAL HIGH (ref 70–99)
Potassium: 4.6 mEq/L (ref 3.5–5.3)
Sodium: 139 mEq/L (ref 135–145)

## 2012-07-22 LAB — HEPATIC FUNCTION PANEL
AST: 19 U/L (ref 0–37)
Alkaline Phosphatase: 70 U/L (ref 39–117)
Indirect Bilirubin: 0.3 mg/dL (ref 0.0–0.9)
Total Bilirubin: 0.4 mg/dL (ref 0.3–1.2)

## 2012-08-02 ENCOUNTER — Ambulatory Visit (INDEPENDENT_AMBULATORY_CARE_PROVIDER_SITE_OTHER): Payer: Medicare Other | Admitting: Family Medicine

## 2012-08-02 ENCOUNTER — Encounter: Payer: Self-pay | Admitting: Family Medicine

## 2012-08-02 VITALS — BP 120/70 | HR 70 | Wt 302.0 lb

## 2012-08-02 DIAGNOSIS — I10 Essential (primary) hypertension: Secondary | ICD-10-CM

## 2012-08-02 DIAGNOSIS — E7800 Pure hypercholesterolemia, unspecified: Secondary | ICD-10-CM

## 2012-08-02 NOTE — Progress Notes (Signed)
  Subjective:    Patient ID: Steven [name], male    DOB: 01-Oct-1940, 72 y.o.   MRN: [id number]  Hyperlipidemia This is a chronic problem. The current episode started more than 1 year ago. The problem is controlled. Recent lipid tests were reviewed and are normal. Exacerbating diseases include obesity. He has no history of diabetes. There are no known factors aggravating his hyperlipidemia. Pertinent negatives include no chest pain. Current antihyperlipidemic treatment includes statins and diet change. The current treatment provides significant improvement of lipids. There are no compliance problems.  Risk factors for coronary artery disease include male sex, obesity, hypertension and a sedentary lifestyle.      Review of Systems  Constitutional: Negative for fever, activity change and appetite change.  HENT: Negative for congestion, rhinorrhea and neck pain.   Eyes: Negative for discharge.  Respiratory: Negative for cough and wheezing.   Cardiovascular: Negative for chest pain.  Gastrointestinal: Negative for vomiting, abdominal pain and blood in stool.  Genitourinary: Negative for frequency and difficulty urinating (hhe has occasional urinary frequency at 9 time but no flow problems.).  Skin: Negative for rash.  Neurological: Negative for headaches.  Psychiatric/Behavioral: Negative for agitation.       Objective:   Physical Exam  Vitals reviewed. Constitutional: He appears well-developed and well-nourished.  HENT:  Head: Normocephalic and atraumatic.  Right Ear: External ear normal.  Left Ear: External ear normal.  Nose: Nose normal.  Mouth/Throat: Oropharynx is clear and moist.  Eyes: EOM are normal. Pupils are equal, round, and reactive to light.  Neck: Normal range of motion. Neck supple. No thyromegaly present.  Cardiovascular: Normal rate, regular rhythm and normal heart sounds.   No murmur heard. Pulmonary/Chest: Effort normal and breath sounds normal. No respiratory  distress. He has no wheezes.  Abdominal: Soft. Bowel sounds are normal. He exhibits no distension and no mass. There is no tenderness.  Musculoskeletal: Normal range of motion. He exhibits no edema.  Lymphadenopathy:    He has no cervical adenopathy.  Neurological: He is alert. He exhibits normal muscle tone.  Skin: Skin is warm and dry. No erythema.  Psychiatric: He has a normal mood and affect. His behavior is normal. Judgment normal.          Assessment & Plan:  Hypertension-overall very good control continue current measures Obesity-he needs to cut back on what he is eating he healthier try to stay as active as his body allows. Hyperlipidemia continue medication recheck lab work in the fall Metabolic 7 and liver panel looked good.

## 2012-08-02 NOTE — Patient Instructions (Signed)
Maintain a healthy diet- avoid starchy sugary foods, keep on your medicines  Increase walking and activity  Follow up in 6 months (Oct for wellness exam-physical)

## 2012-09-01 ENCOUNTER — Other Ambulatory Visit: Payer: Self-pay | Admitting: Family Medicine

## 2012-09-12 IMAGING — CT CT ABD-PELV W/ CM
2 of 5 series · 14 of 46 positions shown, 16 images · IV contrast (Omnipaque 300)
Comparison: None.

CLINICAL DATA: Right lower quadrant abdominal pain, radiating to
the back.  Leukocytosis.

CT ABDOMEN AND PELVIS WITH CONTRAST
TECHNIQUE: Multidetector CT imaging of the abdomen and pelvis was
performed following the standard protocol during bolus
administration of intravenous contrast.
Contrast: 120mL OMNIPAQUE IOHEXOL 300 MG/ML IV SOLN

[Series 2: abd_pel_with 5.0 b40f · axial · 0.89mm/px · z∈[-487,-32]mm · 11 of 103 slices shown, 13 images]
[im 6/103  soft-tissue]
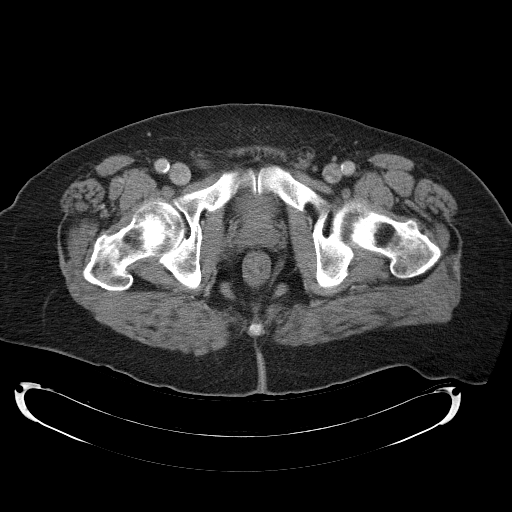
[im 6/103  bone]
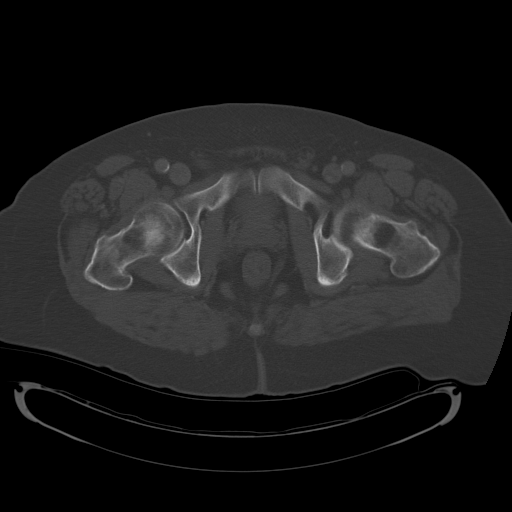
[im 18/103  soft-tissue]
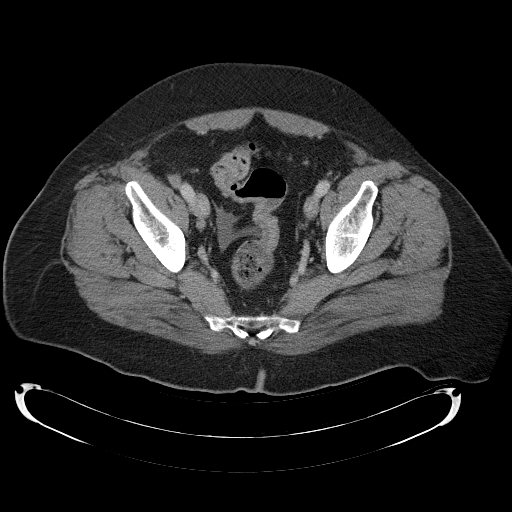
[im 23/103  soft-tissue]
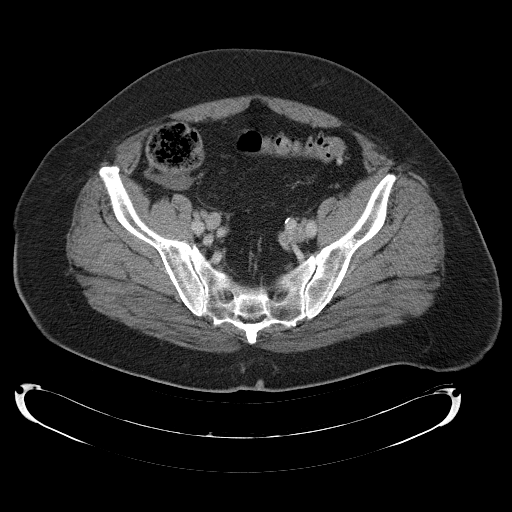
[im 35/103  soft-tissue]
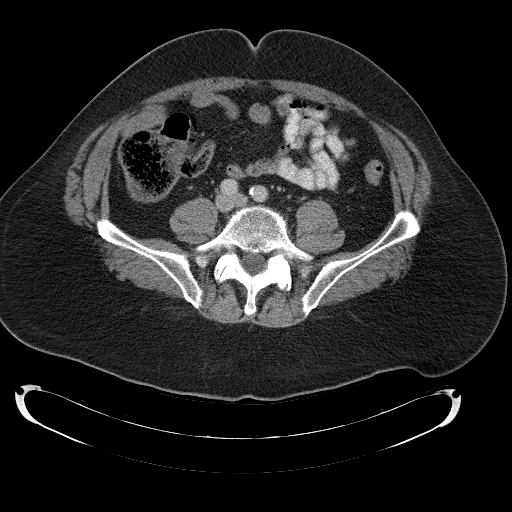
[im 40/103  soft-tissue]
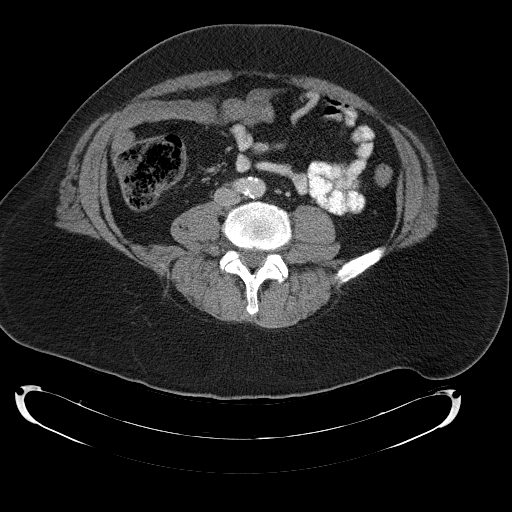
[im 52/103  soft-tissue]
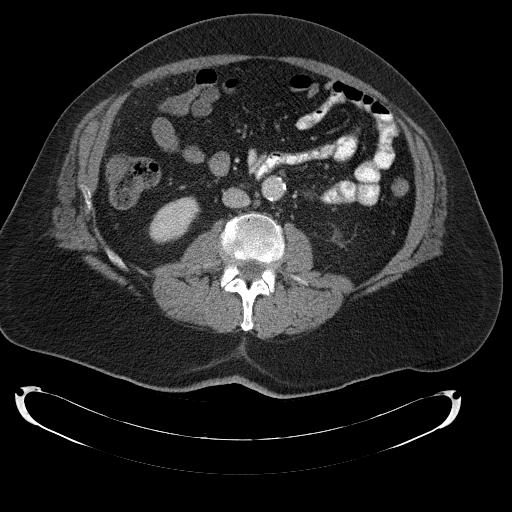
[im 63/103  soft-tissue]
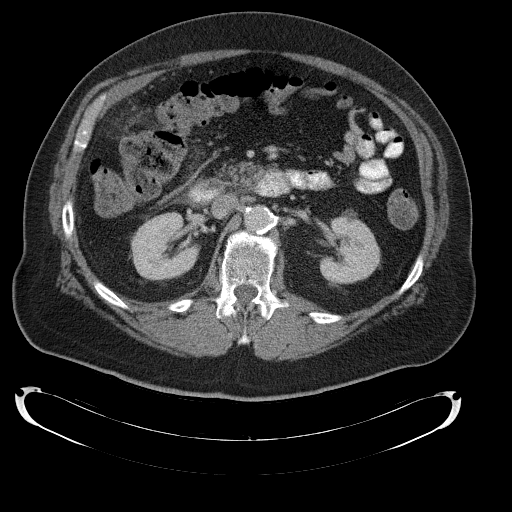
[im 69/103  soft-tissue]
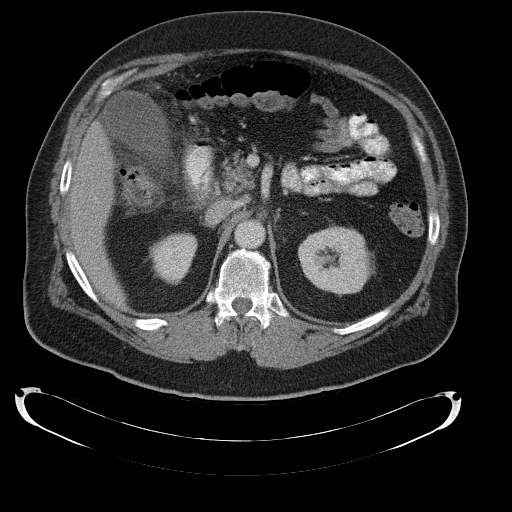
[im 80/103  soft-tissue]
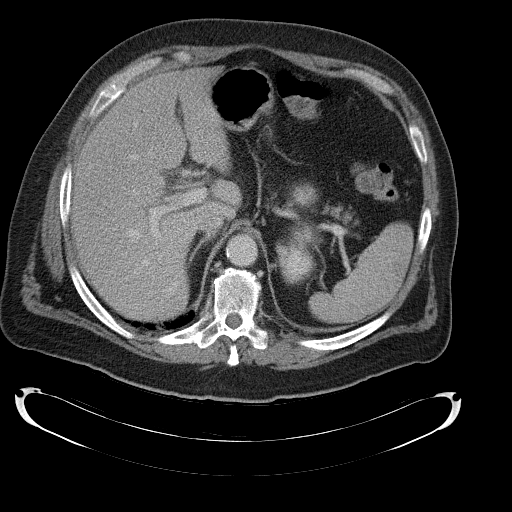
[im 80/103  bone]
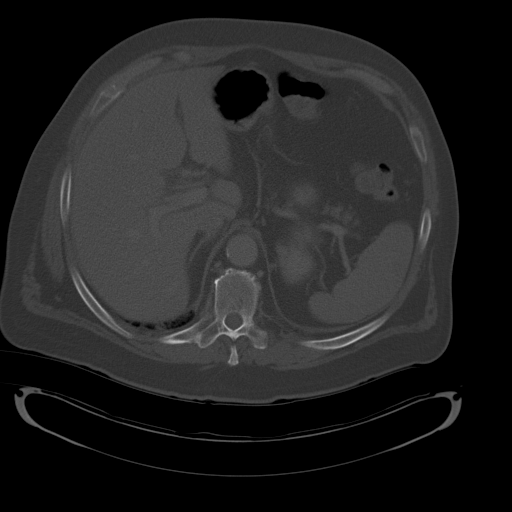
[im 86/103  soft-tissue]
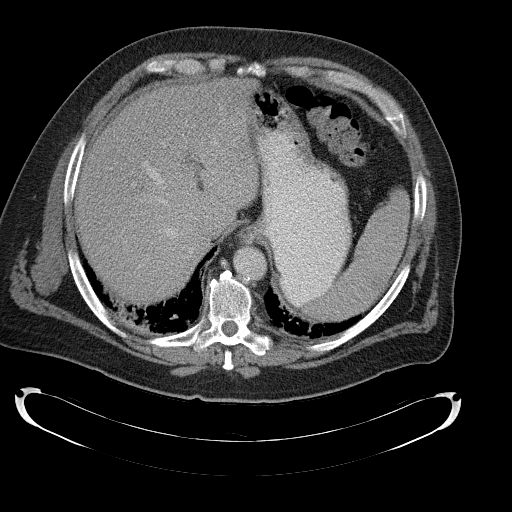
[im 97/103  soft-tissue]
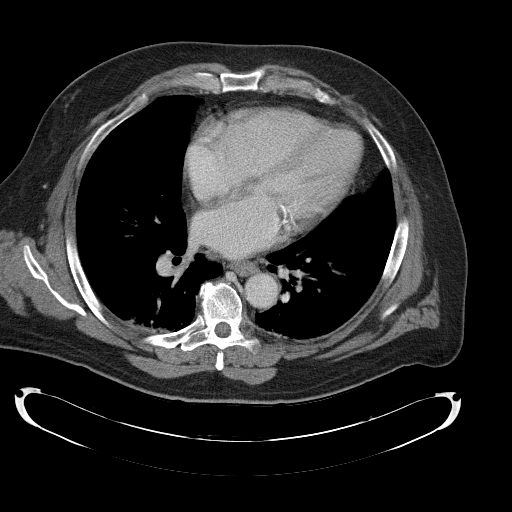

[Series 4: abd_pel_with 3.0 spo cor · coronal · 0.80mm/px · 3 of 109 slices shown]
[im 37/109  soft-tissue]
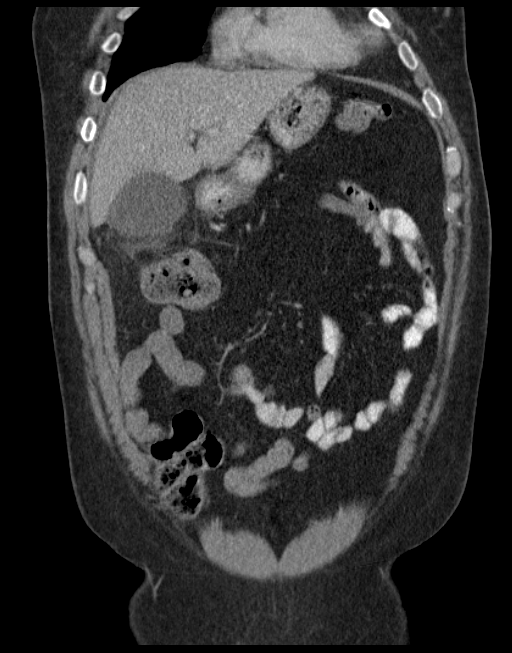
[im 49/109  soft-tissue]
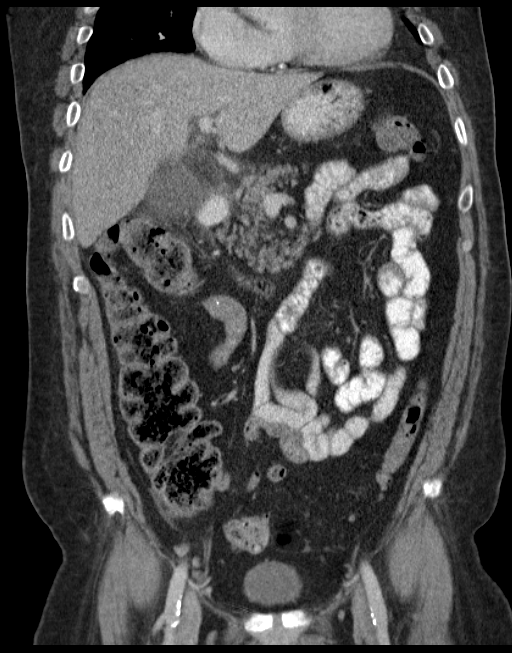
[im 61/109  soft-tissue]
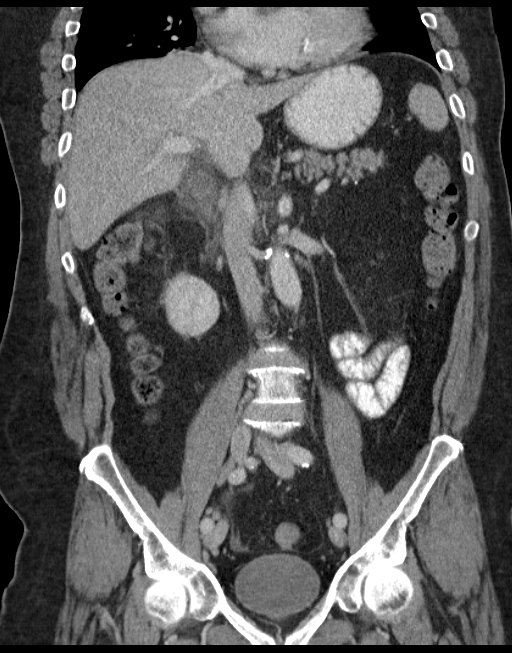

[14 of 46 positions shown; findings below may reference images not displayed]

FINDINGS: Bibasilar atelectasis is noted.  The heart is mildly
enlarged.

There is diffuse soft tissue stranding noted about the distended
gallbladder, compatible with acute cholecystitis.  Trace associated
pericholecystic fluid is noted, with trace fluid tracking about the
liver. Soft tissue inflammation extends about the proximal duodenum
and pancreatic head, though the inflammatory process appears
centered about the gallbladder.  Minimal soft tissue stranding is
noted along Gerota's fascia on the right side, with trace
associated fluid.

The liver is otherwise unremarkable in appearance.  There is no
evidence for significant intrahepatic biliary duct dilatation.  The
spleen is within normal limits.  Two small calcifications at the
body of the pancreas could reflect sequelae of prior pancreatitis.
The adrenal glands are unremarkable in appearance.

A small 5 mm hypodensity in the interpole region of the left kidney
likely reflects a small cyst.  Nonspecific perinephric stranding
and trace fluid are noted bilaterally.  This is more prominent at
the left kidney.  The kidneys are otherwise unremarkable in
appearance.  There is no evidence of hydronephrosis.  No renal or
ureteral stones are seen.

The small bowel is unremarkable in appearance.  The stomach is
filled with contrast and is within normal limits.  No acute
vascular abnormalities are seen.  Scattered calcification is noted
along the abdominal aorta and its branches, particularly prominent
about the origin of the left renal artery.

The appendix is filled with air and is unremarkable in appearance,
without evidence for appendicitis.  Mild soft tissue inflammation
about the hepatic flexure of the colon reflects the adjacent right
upper quadrant process.  Minimal diverticulosis is noted along the
distal descending colon, and diverticulosis is seen along the
proximal sigmoid colon, without evidence of diverticulitis.  There
is apparent left-sided nonspecific wall thickening at the rectum;
this is thought to reflect relative decompression, without a
definite mass.

The bladder is mildly distended and grossly unremarkable in
appearance.  Minimal calcification is noted within the prostate;
the prostate remains normal in size.  Trace free fluid within the
pelvis likely reflects the inflammatory process at the right upper
quadrant.  No inguinal lymphadenopathy is seen.

No acute osseous abnormalities are identified.
IMPRESSION: 1.  Acute cholecystitis noted, with diffuse soft tissue
inflammation about the mildly distended gallbladder, and trace
associated pericholecystic fluid.  Trace fluid tracks about the
liver.  Soft tissue inflammation involves the proximal duodenum and
pancreatic head, though the inflammatory process appears centered
at the gallbladder.
2.  Tiny calcifications at the body of the pancreas could reflect
sequelae of remote pancreatitis; pancreas otherwise unremarkable in
appearance.
3.  Nonspecific bilateral perinephric stranding and trace fluid,
more prominent at the left kidney; small left renal cyst noted.
4.  Scattered calcification along the abdominal aorta and its
branches, preclude prominent about the origin of the left kidney.
5.  Minimal diverticulosis along the distal descending colon, and
diverticulosis along the proximal sigmoid colon, without evidence
of diverticulitis.
6.  Trace free fluid within the pelvis, likely reflecting the
inflammatory process at the right upper quadrant.
7.  Bibasilar atelectasis noted.
8.  Mild cardiomegaly.

## 2012-09-16 ENCOUNTER — Other Ambulatory Visit: Payer: Self-pay | Admitting: Family Medicine

## 2012-09-18 IMAGING — CR DG CHEST 1V PORT
1 series · 1 of 1 positions shown · non-contrast
Comparison: Single view chest 03/17/2011.

CLINICAL DATA: Effusion.

PORTABLE CHEST - 1 VIEW

[AP]
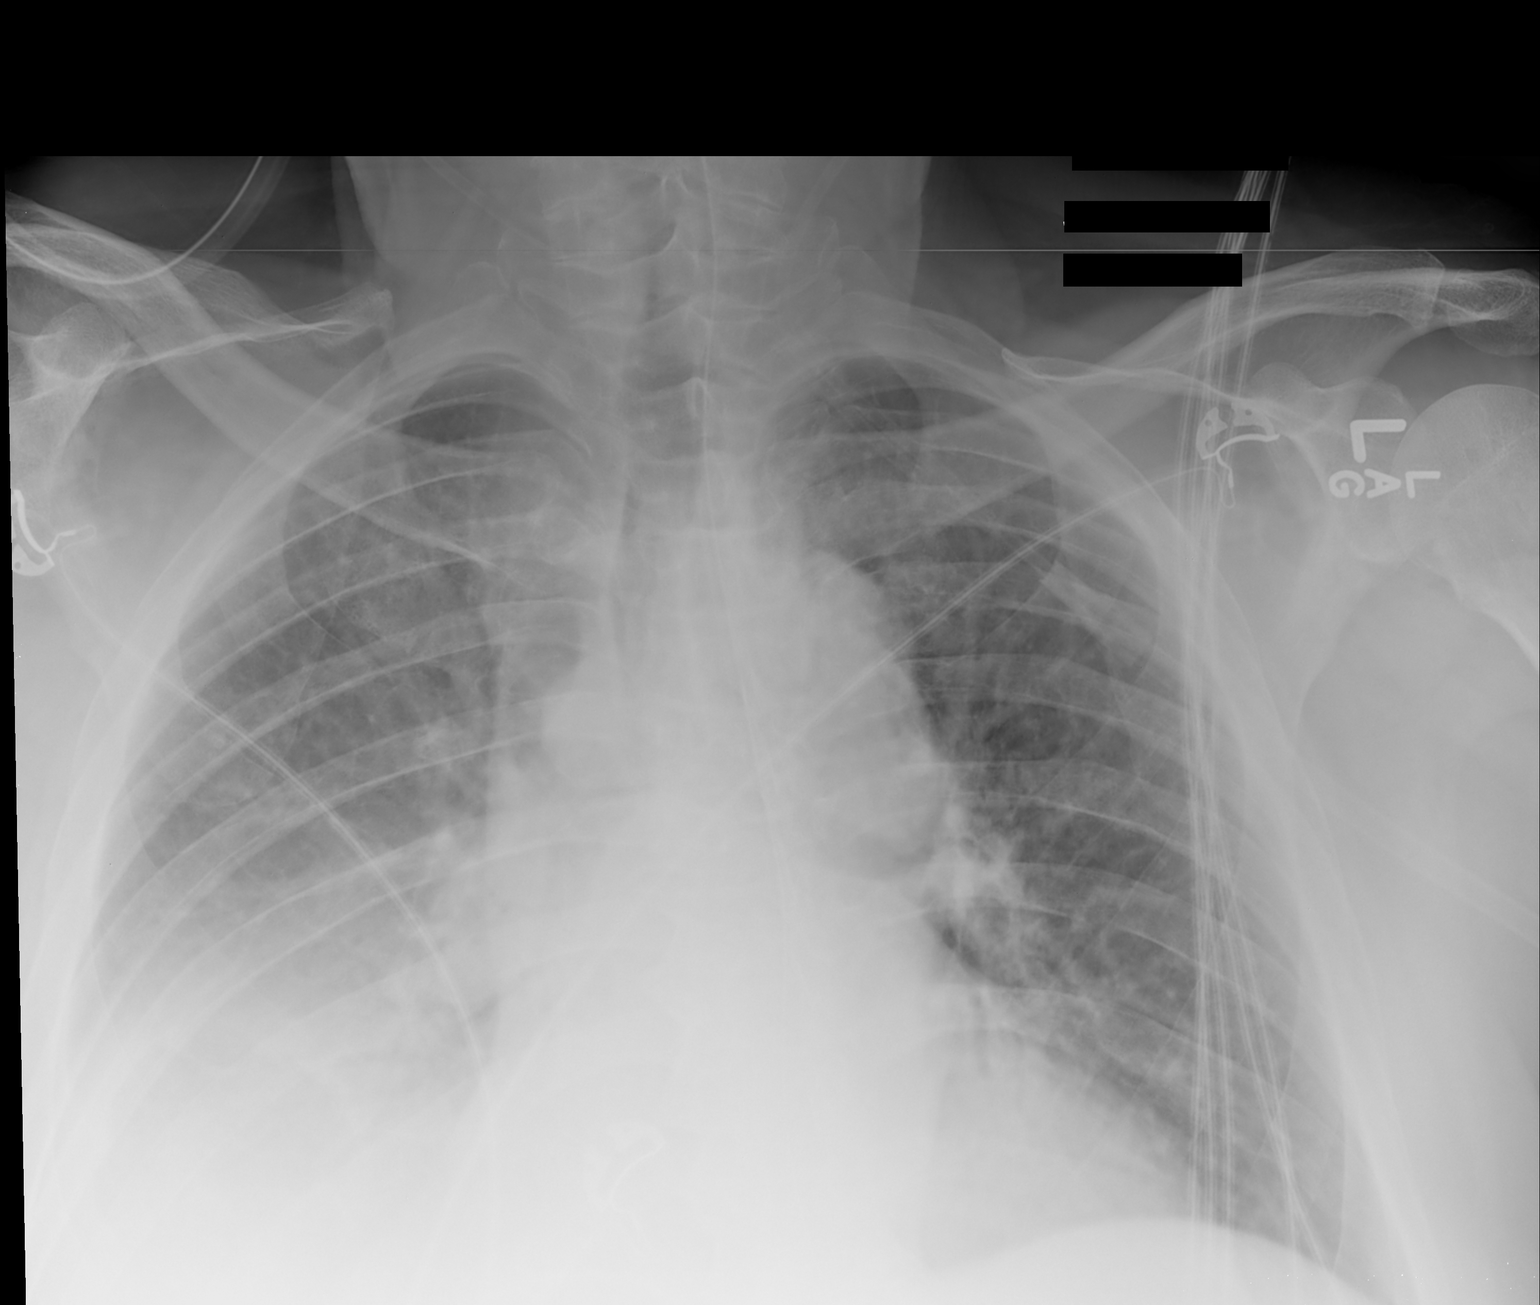

[1 of 1 positions shown; findings below may reference images not displayed]

FINDINGS: An NG tube remains in place.  The heart is mildly
enlarged.  A layering right pleural effusion persist.  Right lower
lobe airspace disease is present.  There is improved aeration at
the left lung base.  Mild edematous changes persist.  The
visualized soft tissues and bony thorax are unremarkable.
IMPRESSION: 1.  Slight improved aeration at the left lung base.
2.  Persistent layering effusion and atelectasis at the right lung
base.
3.  NG tube.

## 2012-10-22 ENCOUNTER — Encounter: Payer: Self-pay | Admitting: *Deleted

## 2012-11-16 ENCOUNTER — Other Ambulatory Visit: Payer: Self-pay | Admitting: Family Medicine

## 2013-01-11 ENCOUNTER — Other Ambulatory Visit: Payer: Self-pay | Admitting: Family Medicine

## 2013-01-17 ENCOUNTER — Telehealth: Payer: Self-pay | Admitting: Family Medicine

## 2013-01-17 DIAGNOSIS — E785 Hyperlipidemia, unspecified: Secondary | ICD-10-CM

## 2013-01-17 DIAGNOSIS — Z125 Encounter for screening for malignant neoplasm of prostate: Secondary | ICD-10-CM

## 2013-01-17 DIAGNOSIS — Z79899 Other long term (current) drug therapy: Secondary | ICD-10-CM

## 2013-01-17 DIAGNOSIS — R7301 Impaired fasting glucose: Secondary | ICD-10-CM

## 2013-01-17 NOTE — Telephone Encounter (Signed)
Lipid, metabolic 7, hemoglobin A1c, urine micro-protein, PSA. Diagnosis-hyperlipidemia, hypertension, hyperglycemia, screening PSA

## 2013-01-17 NOTE — Telephone Encounter (Signed)
Patient has an appointment for Wellness Exam on 01/27/2013 @ 8:30 am.  Patient would like to know if blood work should be completed prior to this appointment?  Okay to leave a message on the machine.  Please call to advise.

## 2013-01-18 NOTE — Telephone Encounter (Signed)
Notified wife blood work has been ordered and can go to [facility] to have it drawn.

## 2013-01-21 LAB — LIPID PANEL
Cholesterol: 116 mg/dL (ref 0–200)
HDL: 35 mg/dL — ABNORMAL LOW (ref >39)
LDL Cholesterol: 58 mg/dL (ref 0–99)
Triglycerides: 117 mg/dL (ref <150)

## 2013-01-21 LAB — BASIC METABOLIC PANEL
BUN: 24 mg/dL — ABNORMAL HIGH (ref 6–23)
CO2: 27 mEq/L (ref 19–32)
Calcium: 9 mg/dL (ref 8.4–10.5)
Creat: 0.86 mg/dL (ref 0.50–1.35)

## 2013-01-27 ENCOUNTER — Ambulatory Visit (INDEPENDENT_AMBULATORY_CARE_PROVIDER_SITE_OTHER): Payer: Medicare Other | Admitting: Family Medicine

## 2013-01-27 ENCOUNTER — Encounter: Payer: Self-pay | Admitting: Family Medicine

## 2013-01-27 VITALS — BP 122/82 | HR 70 | Ht 70.0 in | Wt 296.0 lb

## 2013-01-27 DIAGNOSIS — Z0000 Encounter for general adult medical examination without abnormal findings: Secondary | ICD-10-CM

## 2013-01-27 DIAGNOSIS — M545 Pain in right leg: Secondary | ICD-10-CM

## 2013-01-27 DIAGNOSIS — Z23 Encounter for immunization: Secondary | ICD-10-CM

## 2013-01-27 MED ORDER — ATORVASTATIN CALCIUM 40 MG PO TABS: ORAL_TABLET | ORAL | Status: DC

## 2013-01-27 MED ORDER — OMEPRAZOLE 20 MG PO CPDR: DELAYED_RELEASE_CAPSULE | ORAL | Status: DC

## 2013-01-27 MED ORDER — LISINOPRIL-HYDROCHLOROTHIAZIDE 10-12.5 MG PO TABS: ORAL_TABLET | ORAL | Status: DC

## 2013-01-27 NOTE — Progress Notes (Signed)
  Subjective:    Patient ID: Steven [name], male    DOB: 05-24-1940, 72 y.o.   MRN: [id number]  HPI AWV- Annual Wellness Visit  The patient was seen for their annual wellness visit. The patient's past medical history, surgical history, and family history were reviewed. Pertinent vaccines were reviewed ( tetanus, pneumonia, shingles, flu) The patient's medication list was reviewed and updated. The height and weight were entered. The patient's current BMI is:42.47  Cognitive screening was completed. Outcome of Mini - Cog: PASS  Falls within the past 6 months: NO Current tobacco usage: QUIT OVER 40 YEARS AGO (All patients who use tobacco were given written and verbal information on quitting)  Recent listing of emergency department/hospitalizations over the past year were reviewed.  current specialist the patient sees on a regular basis:   Medicare annual wellness visit patient questionnaire was reviewed.  A written screening schedule for the patient for the next 5-10 years was given. Appropriate discussion of followup regarding next visit was discussed.  Colonoscopy due 2010    Review of Systems  Constitutional: Negative for fever, activity change and appetite change.  HENT: Negative for congestion and rhinorrhea.   Eyes: Negative for discharge.  Respiratory: Negative for cough and wheezing.   Cardiovascular: Negative for chest pain.  Gastrointestinal: Negative for vomiting, abdominal pain and blood in stool.  Genitourinary: Negative for frequency and difficulty urinating.  Musculoskeletal: Negative for neck pain.  Skin: Negative for rash.  Allergic/Immunologic: Negative for environmental allergies and food allergies.  Neurological: Negative for weakness and headaches.  Psychiatric/Behavioral: Negative for agitation.       Objective:   Physical Exam  Constitutional: He appears well-developed and well-nourished.  HENT:  Head: Normocephalic and atraumatic.  Right Ear:  External ear normal.  Left Ear: External ear normal.  Nose: Nose normal.  Mouth/Throat: Oropharynx is clear and moist.  Eyes: EOM are normal. Pupils are equal, round, and reactive to light.  Neck: Normal range of motion. Neck supple. No thyromegaly present.  Cardiovascular: Normal rate, regular rhythm and normal heart sounds.   No murmur heard. Pulmonary/Chest: Effort normal and breath sounds normal. No respiratory distress. He has no wheezes.  Abdominal: Soft. Bowel sounds are normal. He exhibits no distension and no mass. There is no tenderness.  Genitourinary: Penis normal.  Musculoskeletal: Normal range of motion. He exhibits no edema.  Lymphadenopathy:    He has no cervical adenopathy.  Neurological: He is alert. He exhibits normal muscle tone.  Skin: Skin is warm and dry. No erythema.  Psychiatric: He has a normal mood and affect. His behavior is normal. Judgment normal.          Assessment & Plan:  Annual wellness visit-discuss dietary measures regular exercise. Also discussed the importance of sticking with his medications. Wellness/safety/dietary/upcoming tests all reviewed. Patient's greatest risk factor is his morbid obesity along with blood pressure issues.  Chronic low back pain discomfort patient has severe back pain takes Dilaudid-HP. Also gets injections periodically. Also has burning pain and discomfort down the right leg on a regular basis. This been going on for over a year. Now debilitating to the point where he can only walk a small distance before stopping he can only stand for a few minutes before he has to sit down I would recommend that we set him up for MRI of the lower back also recommend that we set him up to see Dr. Channing Mutters for a second opinion regarding his

## 2013-01-27 NOTE — Patient Instructions (Signed)
Thank you for coming for your annual wellness visit.  Please follow through on any advice that was given to you by today's visit. Remember to maintain compliance with your medications as discussed today.  Also remember it is important to eat a healthy diet and to stay physically active on a daily basis.  Please follow through with any testing or recommended followup office visits as was discussed today. You are due the following test coming up:  ALL- Colonoscopy-due 2015           Vaccines-Flu vaccine today    Males- Prostate Exam_yearly     Finally remembered that the annual wellness visit does not take the place of regularly scheduled office visits  chronic health problems such as hypertension/diabetes/cholesterol visits.

## 2013-02-01 ENCOUNTER — Telehealth: Payer: Self-pay | Admitting: Family Medicine

## 2013-02-01 NOTE — Telephone Encounter (Signed)
Auth obtained for MRI L-spine, for Steven [name], auth # put in system, just need to schedule and notify pt, please use a date from 10/23/201414 - 03/02/2013 so that the auth # will be valid This was in my "Q" so I did preauth and then sent to nurses for scheduling

## 2013-02-01 NOTE — Telephone Encounter (Signed)
Left message with wife to have patient return call. 

## 2013-02-02 NOTE — Telephone Encounter (Signed)
MRI scheduled for 02/08/13 at 8 am at [facility]. Notified patient and he verbalized understanding.

## 2013-02-08 ENCOUNTER — Ambulatory Visit (HOSPITAL_COMMUNITY)
Admission: RE | Admit: 2013-02-08 | Discharge: 2013-02-08 | Disposition: A | Discharge status: Home / Self Care | Payer: Medicare Other | Source: Ambulatory Visit | Attending: Family Medicine | Admitting: Family Medicine

## 2013-02-08 ENCOUNTER — Encounter (HOSPITAL_COMMUNITY): Payer: Self-pay

## 2013-02-08 DIAGNOSIS — M545 Low back pain, unspecified: Secondary | ICD-10-CM | POA: Insufficient documentation

## 2013-02-08 DIAGNOSIS — M5126 Other intervertebral disc displacement, lumbar region: Secondary | ICD-10-CM | POA: Insufficient documentation

## 2013-02-14 ENCOUNTER — Telehealth: Payer: Self-pay | Admitting: Family Medicine

## 2013-02-14 MED ORDER — AMOXICILLIN 500 MG PO TABS: ORAL_TABLET | ORAL | Status: DC

## 2013-02-14 NOTE — Telephone Encounter (Signed)
May send in prescription amoxicillin 500 mg tablets. #12, directions take 4 tablets before dental procedure as directed, one refill

## 2013-02-14 NOTE — Telephone Encounter (Signed)
Steven [name] has an appointment with the Dentist on Wednesday.  States he is suppose to take an antibiotic prior to this appointment, However he is switching Dentist and the prescription has expired from the prior Dentist.  Can Dr. Lorin Picket call Amoxicillin in to [facility], states he is suppose to take 4 tablets at a time.  Will call by to our office with the dosage states it will be a couple of hour before they can call

## 2013-02-14 NOTE — Telephone Encounter (Signed)
Left message on voicemail notifying pt med was sent into [facility].

## 2013-02-14 NOTE — Telephone Encounter (Signed)
States the Dosage is 500 mg, 4 tablets prior to Dental Appointment due to Knee replacements.  States the prior prescription had 12 tablets

## 2013-03-16 ENCOUNTER — Telehealth: Payer: Self-pay | Admitting: Family Medicine

## 2013-03-16 ENCOUNTER — Other Ambulatory Visit: Payer: Self-pay | Admitting: Family Medicine

## 2013-03-16 DIAGNOSIS — H9193 Unspecified hearing loss, bilateral: Secondary | ICD-10-CM

## 2013-03-16 NOTE — Telephone Encounter (Signed)
Pt needs referral to Dr. Dorma Russell at [facility] [place] due to hearing loss.  Currently has hearing aids but not hearing well, may need new ones.  [facility] explained to pt's wife that due to insurance, referral is needed.  Please initiate in system so that I may process referral

## 2013-03-16 NOTE — Telephone Encounter (Signed)
The referral was put in thank you.

## 2013-05-11 ENCOUNTER — Ambulatory Visit (INDEPENDENT_AMBULATORY_CARE_PROVIDER_SITE_OTHER): Payer: Medicare Other | Admitting: Family Medicine

## 2013-05-11 ENCOUNTER — Encounter: Payer: Self-pay | Admitting: Family Medicine

## 2013-05-11 VITALS — BP 112/72 | Ht 70.0 in | Wt 308.0 lb

## 2013-05-11 DIAGNOSIS — R7309 Other abnormal glucose: Secondary | ICD-10-CM

## 2013-05-11 DIAGNOSIS — E7800 Pure hypercholesterolemia, unspecified: Secondary | ICD-10-CM

## 2013-05-11 DIAGNOSIS — I10 Essential (primary) hypertension: Secondary | ICD-10-CM

## 2013-05-11 DIAGNOSIS — R7303 Prediabetes: Secondary | ICD-10-CM

## 2013-05-11 LAB — POCT GLYCOSYLATED HEMOGLOBIN (HGB A1C): Hemoglobin A1C: 5.8

## 2013-05-11 NOTE — Progress Notes (Signed)
   Subjective:    Patient ID: Steven [name], male    DOB: 1940-10-05, 73 y.o.   MRN: [id number]  HPI Patient is here today for a check up. Patient has a history of hypertension coronary artery disease along with significant obesity hyperlipidemia and has had some spells of hyperglycemia he denies excessive thirst urination vomiting diarrhea denies any chest  He has no concerns.     Review of Systems  Constitutional: Negative for fever, activity change, appetite change and fatigue.  HENT: Negative for congestion.   Respiratory: Negative for cough.   Cardiovascular: Negative for chest pain.  Gastrointestinal: Negative for abdominal pain.       Frequent BM daily, soft, past few days,no blood  Endocrine: Negative for polydipsia and polyphagia.  Neurological: Negative for weakness.  Psychiatric/Behavioral: Negative for confusion.       Objective:   Physical Exam  Constitutional: He appears well-developed and well-nourished.  HENT:  Head: Normocephalic and atraumatic.  Right Ear: External ear normal.  Left Ear: External ear normal.  Nose: Nose normal.  Mouth/Throat: Oropharynx is clear and moist.  Eyes: EOM are normal. Pupils are equal, round, and reactive to light.  Neck: Normal range of motion. Neck supple. No thyromegaly present.  Cardiovascular: Normal rate, regular rhythm and normal heart sounds.   No murmur heard. Pulmonary/Chest: Effort normal and breath sounds normal. No respiratory distress. He has no wheezes.  Abdominal: Soft. Bowel sounds are normal. He exhibits no distension and no mass. There is no tenderness.  Genitourinary: Penis normal.  Musculoskeletal: Normal range of motion. He exhibits no edema.  Lymphadenopathy:    He has no cervical adenopathy.  Neurological: He is alert. He exhibits normal muscle tone.  Skin: Skin is warm and dry. No erythema.  Psychiatric: He has a normal mood and affect. His behavior is normal. Judgment normal.          Assessment &  Plan:  The patient has severe back troubles and pains he saw Dr Carloyn Manner- no surgery, to do water areobics if possible for now no other intervention   HTN- stable 118/72 continue everything as is heart healthy diet recommend  Prediabetes- A1C 5.8 patient with a history of hyperglycemia A1c looks great  Recheck in Fall

## 2013-07-01 ENCOUNTER — Ambulatory Visit (INDEPENDENT_AMBULATORY_CARE_PROVIDER_SITE_OTHER): Payer: Medicare Other | Admitting: Cardiovascular Disease

## 2013-07-01 ENCOUNTER — Encounter: Payer: Self-pay | Admitting: Cardiovascular Disease

## 2013-07-01 VITALS — BP 142/92 | HR 60 | Ht 70.0 in | Wt 310.0 lb

## 2013-07-01 DIAGNOSIS — E7800 Pure hypercholesterolemia, unspecified: Secondary | ICD-10-CM

## 2013-07-01 DIAGNOSIS — I10 Essential (primary) hypertension: Secondary | ICD-10-CM

## 2013-07-01 DIAGNOSIS — I2510 Atherosclerotic heart disease of native coronary artery without angina pectoris: Secondary | ICD-10-CM

## 2013-07-01 DIAGNOSIS — I4891 Unspecified atrial fibrillation: Secondary | ICD-10-CM

## 2013-07-01 NOTE — Patient Instructions (Signed)
Your physician recommends that you continue on your current medications as directed. Please refer to the Current Medication list given to you today.  Your physician wants you to follow-up in: 1 year with Dr. Cooper.  You will receive a reminder letter in the mail two months in advance. If you don't receive a letter, please call our office to schedule the follow-up appointment.   

## 2013-07-01 NOTE — Progress Notes (Signed)
HPI:  73 year old gentleman presenting for followup evaluation. The patient has coronary artery disease. He's been managed medically because of no anginal symptoms. He has a history of postoperative atrial fibrillation several years ago but has maintained sinus rhythm otherwise.  No new cardiac symptoms reported. He has chronic DOE which we have attributed to obesity and deconditioning. Primarily limited by joint pains. No chest pain or pressure. No orthopnea or PND. Complains of swelling in his knees.   Outpatient Encounter Prescriptions as of 07/01/2013  Medication Sig  . aspirin 81 MG tablet Take 81 mg by mouth daily.  Marland Kitchen atorvastatin (LIPITOR) 40 MG tablet TAKE ONE TABLET DAILY.  . cetirizine (ZYRTEC) 10 MG tablet Take 10 mg by mouth daily.  . cholecalciferol (VITAMIN D) 1000 UNITS tablet Take 1,000 Units by mouth daily.  Marland Kitchen HYDROmorphone (DILAUDID) 4 MG tablet Take 1 tablet (4 mg total) by mouth every 4 (four) hours as needed. Pain  . lisinopril-hydrochlorothiazide (PRINZIDE,ZESTORETIC) 10-12.5 MG per tablet TAKE (1) TABLET BY MOUTH EACH MORNING.  . Methylcellulose, Laxative, (CITRUCEL) 500 MG TABS Take 1 tablet by mouth daily.   . Omega-3 Fatty Acids (FISH OIL) 1000 MG CAPS Take 1,000 mg by mouth daily.  Marland Kitchen omeprazole (PRILOSEC) 20 MG capsule TAKE ONE CAPSULE DAILY.  . vitamin B-12 (CYANOCOBALAMIN) 1000 MCG tablet Take 1,000 mcg by mouth daily.    Allergies  Allergen Reactions  . Percocet [Oxycodone-Acetaminophen]     rash  . Ciprofloxacin Rash  . Oxycodone-Acetaminophen Rash and Other (See Comments)    Felt like he was hot    Past Medical History  Diagnosis Date  . Obesity   . Dyslipidemia   . Coronary artery disease 02-27-11    Dr. Jacquiline Doe follows-has some blocked coronary arteries ,not suitable for stent  placement  . Hypertension   . Shingles outbreak 02-27-11    2 weeks ago , was tx.-only residual is tenderness of right scalp-no open areas  . GERD  (gastroesophageal reflux disease) 02-27-11    Acid reflux  . Hemorrhoids 02-27-11    not bothersome at this time  . Hearing loss 02-27-11    Bilateral hearing aids-due to exposure to loud machinery  . Urinary incontinence 02-27-11    not an everday occurrence-no special measures  . Osteoarthritis 02-27-11    Ostearthritis-knees, shoulders.Back causes chronic pain-radiates down right leg  . Chronic pain   . Hyperlipidemia   . Leg swelling   . Rash   . Complication of anesthesia     pt woke up during last surgery   . Dysrhythmia     hx of atrial fib during last hospitalization   . Shortness of breath     with exertion     ROS: Negative except as per HPI  BP 142/92  Pulse 60  Ht 5\' 10"  (1.778 m)  Wt 310 lb (140.615 kg)  BMI 44.48 kg/m2  PHYSICAL EXAM: Pt is alert and oriented, pleasant obese male in NAD HEENT: normal Neck: JVP - normal, carotids 2+= without bruits Lungs: CTA bilaterally CV: RRR without murmur or gallop Abd: soft, NT, Positive BS, obese Ext: no C/C/E, distal pulses intact and equal Skin: warm/dry no rash  EKG:  Sinus rhythm with first degree AV block, heart rate 60 beats per minute, otherwise within normal limits.  ASSESSMENT AND PLAN: 1. CAD, native vessel. Small vessel disease noted. No angina. Continue current Rx's.  2. Obesity - lengthy discussion about diet, exercise, weight loss goals.   3. HTN -  borderline control. Will double lisinopril / HCT  4. Hyperlipidemia - on statin drug. LDL 58.  Follow-up one year  Sherren Mocha 07/04/2013 11:11 PM

## 2013-11-28 ENCOUNTER — Encounter: Payer: Self-pay | Admitting: Family Medicine

## 2013-11-28 ENCOUNTER — Ambulatory Visit (INDEPENDENT_AMBULATORY_CARE_PROVIDER_SITE_OTHER): Payer: Medicare Other | Admitting: Family Medicine

## 2013-11-28 VITALS — BP 120/78 | Ht 71.0 in | Wt 305.0 lb

## 2013-11-28 DIAGNOSIS — G8929 Other chronic pain: Secondary | ICD-10-CM

## 2013-11-28 DIAGNOSIS — M549 Dorsalgia, unspecified: Secondary | ICD-10-CM

## 2013-11-28 DIAGNOSIS — F1190 Opioid use, unspecified, uncomplicated: Secondary | ICD-10-CM

## 2013-11-28 DIAGNOSIS — F1110 Opioid abuse, uncomplicated: Secondary | ICD-10-CM

## 2013-11-28 MED ORDER — HYDROMORPHONE HCL 4 MG PO TABS: 4.0000 mg | ORAL_TABLET | Freq: Four times a day (QID) | ORAL | Status: DC | PRN

## 2013-11-28 NOTE — Progress Notes (Signed)
   Subjective:    Patient ID: Steven [name], male    DOB: 12-21-1940, 73 y.o.   MRN: [id number]  Hypertension This is a chronic problem. The current episode started more than 1 year ago.   Patient has concerns about back pian he's been having since 2nd knee operation. Patient relates he has seen both of his specialist who told him there is nothing they can do for his back other than pain meds  He relates he takes blood pressure medicine as directed he denies any problems with that. Review of Systems Denies any history of abuse. Denies chest pain shortness of breath.    Objective:   Physical Exam Lungs are clear heart is regular pulses normal extremities no edema skin warm dry subjective discomfort in his lower back radiates into the legs       Assessment & Plan:  Chronic back pain inoperable. Was under the care of Dr.Ramos and Dr. Carloyn Manner. They do not feel any surgery or procedures will help that. He recommends pain medicine the only medicine he can tolerate Dilaudid. He takes one approximately 3 times a day very rarely 4 times a day he states he does not abuse and he relates he's been on it for several years.  HTN stable  2 prescriptions were given he will followup for wellness exam plus chronic pain in October

## 2014-01-07 ENCOUNTER — Other Ambulatory Visit: Payer: Self-pay | Admitting: Family Medicine

## 2014-01-09 ENCOUNTER — Other Ambulatory Visit: Payer: Self-pay | Admitting: Family Medicine

## 2014-01-20 ENCOUNTER — Telehealth: Payer: Self-pay | Admitting: *Deleted

## 2014-01-20 DIAGNOSIS — Z125 Encounter for screening for malignant neoplasm of prostate: Secondary | ICD-10-CM

## 2014-01-20 DIAGNOSIS — E785 Hyperlipidemia, unspecified: Secondary | ICD-10-CM

## 2014-01-20 DIAGNOSIS — E119 Type 2 diabetes mellitus without complications: Secondary | ICD-10-CM

## 2014-01-20 DIAGNOSIS — Z79899 Other long term (current) drug therapy: Secondary | ICD-10-CM

## 2014-01-20 DIAGNOSIS — R5383 Other fatigue: Secondary | ICD-10-CM

## 2014-01-20 NOTE — Telephone Encounter (Signed)
Left message on voicemail notifying patient that blood work has been ordered.  

## 2014-01-20 NOTE — Telephone Encounter (Signed)
All same as last year and urine microprot and cbc

## 2014-01-20 NOTE — Telephone Encounter (Addendum)
Lipid, Liver, met 7, HgbAic and PSA 10/14

## 2014-01-20 NOTE — Telephone Encounter (Signed)
Pt's wife called requesting a lab order, pt wants to come get it today, pt is scheduled a physical. Please advise [phone]

## 2014-01-23 ENCOUNTER — Ambulatory Visit: Payer: Medicare Other | Admitting: Family Medicine

## 2014-01-24 LAB — LIPID PANEL
CHOL/HDL RATIO: 3.3 ratio
Cholesterol: 122 mg/dL (ref 0–200)
HDL: 37 mg/dL — AB (ref >39)
LDL CALC: 68 mg/dL (ref 0–99)
TRIGLYCERIDES: 83 mg/dL (ref <150)
VLDL: 17 mg/dL (ref 0–40)

## 2014-01-24 LAB — HEPATIC FUNCTION PANEL
ALT: 15 U/L (ref 0–53)
AST: 18 U/L (ref 0–37)
Albumin: 3.9 g/dL (ref 3.5–5.2)
Alkaline Phosphatase: 68 U/L (ref 39–117)
BILIRUBIN DIRECT: 0.1 mg/dL (ref 0.0–0.3)
BILIRUBIN TOTAL: 0.4 mg/dL (ref 0.2–1.2)
Indirect Bilirubin: 0.3 mg/dL (ref 0.2–1.2)
Total Protein: 6.3 g/dL (ref 6.0–8.3)

## 2014-01-24 LAB — CBC WITH DIFFERENTIAL/PLATELET
BASOS ABS: 0 10*3/uL (ref 0.0–0.1)
Basophils Relative: 0 % (ref 0–1)
EOS PCT: 2 % (ref 0–5)
Eosinophils Absolute: 0.1 10*3/uL (ref 0.0–0.7)
HCT: 41.6 % (ref 39.0–52.0)
Hemoglobin: 14.2 g/dL (ref 13.0–17.0)
LYMPHS ABS: 1.3 10*3/uL (ref 0.7–4.0)
LYMPHS PCT: 21 % (ref 12–46)
MCH: 31.3 pg (ref 26.0–34.0)
MCHC: 34.1 g/dL (ref 30.0–36.0)
MCV: 91.8 fL (ref 78.0–100.0)
MONO ABS: 0.4 10*3/uL (ref 0.1–1.0)
Monocytes Relative: 7 % (ref 3–12)
NEUTROS ABS: 4.3 10*3/uL (ref 1.7–7.7)
Neutrophils Relative %: 70 % (ref 43–77)
Platelets: 218 10*3/uL (ref 150–400)
RBC: 4.53 MIL/uL (ref 4.22–5.81)
RDW: 13.7 % (ref 11.5–15.5)
WBC: 6.1 10*3/uL (ref 4.0–10.5)

## 2014-01-24 LAB — BASIC METABOLIC PANEL
BUN: 23 mg/dL (ref 6–23)
CO2: 27 meq/L (ref 19–32)
Calcium: 9.6 mg/dL (ref 8.4–10.5)
Chloride: 103 mEq/L (ref 96–112)
Creat: 0.96 mg/dL (ref 0.50–1.35)
Glucose, Bld: 101 mg/dL — ABNORMAL HIGH (ref 70–99)
POTASSIUM: 4.4 meq/L (ref 3.5–5.3)
SODIUM: 139 meq/L (ref 135–145)

## 2014-01-24 LAB — PSA: PSA: 0.13 ng/mL (ref <=4.00)

## 2014-01-24 LAB — HEMOGLOBIN A1C
Hgb A1c MFr Bld: 6.2 % — ABNORMAL HIGH (ref <5.7)
Mean Plasma Glucose: 131 mg/dL — ABNORMAL HIGH (ref <117)

## 2014-01-24 LAB — MICROALBUMIN, URINE: MICROALB UR: 0.5 mg/dL (ref <2.0)

## 2014-01-30 ENCOUNTER — Encounter: Payer: Self-pay | Admitting: Family Medicine

## 2014-01-30 ENCOUNTER — Ambulatory Visit (INDEPENDENT_AMBULATORY_CARE_PROVIDER_SITE_OTHER): Payer: Medicare Other | Admitting: Family Medicine

## 2014-01-30 VITALS — BP 120/82 | Ht 71.0 in | Wt 312.0 lb

## 2014-01-30 DIAGNOSIS — Z23 Encounter for immunization: Secondary | ICD-10-CM

## 2014-01-30 DIAGNOSIS — G8929 Other chronic pain: Secondary | ICD-10-CM

## 2014-01-30 DIAGNOSIS — M549 Dorsalgia, unspecified: Secondary | ICD-10-CM

## 2014-01-30 DIAGNOSIS — R7303 Prediabetes: Secondary | ICD-10-CM | POA: Insufficient documentation

## 2014-01-30 DIAGNOSIS — Z0000 Encounter for general adult medical examination without abnormal findings: Secondary | ICD-10-CM

## 2014-01-30 DIAGNOSIS — R7309 Other abnormal glucose: Secondary | ICD-10-CM

## 2014-01-30 MED ORDER — HYDROMORPHONE HCL 4 MG PO TABS: 4.0000 mg | ORAL_TABLET | Freq: Four times a day (QID) | ORAL | Status: DC | PRN

## 2014-01-30 NOTE — Patient Instructions (Signed)
Diabetes Mellitus and Food It is important for you to manage your blood sugar (glucose) level. Your blood glucose level can be greatly affected by what you eat. Eating healthier foods in the appropriate amounts throughout the day at about the same time each day will help you control your blood glucose level. It can also help slow or prevent worsening of your diabetes mellitus. Healthy eating may even help you improve the level of your blood pressure and reach or maintain a healthy weight.  HOW CAN FOOD AFFECT ME? Carbohydrates Carbohydrates affect your blood glucose level more than any other type of food. Your dietitian will help you determine how many carbohydrates to eat at each meal and teach you how to count carbohydrates. Counting carbohydrates is important to keep your blood glucose at a healthy level, especially if you are using insulin or taking certain medicines for diabetes mellitus. Alcohol Alcohol can cause sudden decreases in blood glucose (hypoglycemia), especially if you use insulin or take certain medicines for diabetes mellitus. Hypoglycemia can be a life-threatening condition. Symptoms of hypoglycemia (sleepiness, dizziness, and disorientation) are similar to symptoms of having too much alcohol.  If your health care provider has given you approval to drink alcohol, do so in moderation and use the following guidelines:  Women should not have more than one drink per day, and men should not have more than two drinks per day. One drink is equal to:  12 oz of beer.  5 oz of wine.  1 oz of hard liquor.  Do not drink on an empty stomach.  Keep yourself hydrated. Have water, diet soda, or unsweetened iced tea.  Regular soda, juice, and other mixers might contain a lot of carbohydrates and should be counted. WHAT FOODS ARE NOT RECOMMENDED? As you make food choices, it is important to remember that all foods are not the same. Some foods have fewer nutrients per serving than other  foods, even though they might have the same number of calories or carbohydrates. It is difficult to get your body what it needs when you eat foods with fewer nutrients. Examples of foods that you should avoid that are high in calories and carbohydrates but low in nutrients include:  Trans fats (most processed foods list trans fats on the Nutrition Facts label).  Regular soda.  Juice.  Candy.  Sweets, such as cake, pie, doughnuts, and cookies.  Fried foods. WHAT FOODS CAN I EAT? Have nutrient-rich foods, which will nourish your body and keep you healthy. The food you should eat also will depend on several factors, including:  The calories you need.  The medicines you take.  Your weight.  Your blood glucose level.  Your blood pressure level.  Your cholesterol level. You also should eat a variety of foods, including:  Protein, such as meat, poultry, fish, tofu, nuts, and seeds (lean animal proteins are best).  Fruits.  Vegetables.  Dairy products, such as milk, cheese, and yogurt (low fat is best).  Breads, grains, pasta, cereal, rice, and beans.  Fats such as olive oil, trans fat-free margarine, canola oil, avocado, and olives. DOES EVERYONE WITH DIABETES MELLITUS HAVE THE SAME MEAL PLAN? Because every person with diabetes mellitus is different, there is not one meal plan that works for everyone. It is very important that you meet with a dietitian who will help you create a meal plan that is just right for you. Document Released: 12/26/2004 Document Revised: 04/05/2013 Document Reviewed: 02/25/2013 Exit[employer] Patient Information 2015 [employer]. This   information is not intended to replace advice given to you by your health care provider. Make sure you discuss any questions you have with your health care provider.  

## 2014-01-30 NOTE — Progress Notes (Signed)
Subjective:    Patient ID: Park Breed, male    DOB: 1941-01-07, 73 y.o.   MRN: [id number]  HPI AWV- Annual Wellness Visit  The patient was seen for their annual wellness visit. The patient's past medical history, surgical history, and family history were reviewed. Pertinent vaccines were reviewed ( tetanus, pneumonia, shingles, flu)   WANTS FLU VACCINE  The patient's medication list was reviewed and updated.  The height and weight were entered. The patient's current BMI is: 78  Cognitive screening was completed. Outcome of Mini - Cog: PASS  Falls within the past 6 months: NO  Current tobacco usage: NO (All patients who use tobacco were given written and verbal information on quitting)  Recent listing of emergency department/hospitalizations over the past year were reviewed.  current specialist the patient sees on a regular basis: NO   Medicare annual wellness visit patient questionnaire was reviewed.  A written screening schedule for the patient for the next 5-10 years was given. Appropriate discussion of followup regarding next visit was discussed.  This patient was seen today for chronic pain  The medication list was reviewed and updated.   -Compliance with pain medication: good  The patient was advised the importance of maintaining medication and not using illegal substances with these.  Refills needed: yes  The patient was educated that we can provide 3 monthly scripts for their medication, it is their responsibility to follow the instructions.  Side effects or complications from medications: none  Patient is aware that pain medications are meant to minimize the severity of the pain to allow their pain levels to improve to allow for better function. They are aware of that pain medications cannot totally remove their pain.  Due for UDT ( at least once per year) : deferred    I talked to the patient at length about the importance of taking medications from  watching salt in his diet exercising on a regular basis and also talk with the patient about his prediabetes and how to keep that under better control      Review of Systems  Constitutional: Negative for fever, activity change and appetite change.  HENT: Negative for congestion and rhinorrhea.   Eyes: Negative for discharge.  Respiratory: Negative for cough and wheezing.   Cardiovascular: Negative for chest pain.  Gastrointestinal: Negative for vomiting, abdominal pain and blood in stool.  Genitourinary: Negative for frequency and difficulty urinating.  Musculoskeletal: Negative for neck pain.  Skin: Negative for rash.  Allergic/Immunologic: Negative for environmental allergies and food allergies.  Neurological: Negative for weakness and headaches.  Psychiatric/Behavioral: Negative for agitation.       Objective:   Physical Exam  Constitutional: He appears well-developed and well-nourished.  HENT:  Head: Normocephalic and atraumatic.  Right Ear: External ear normal.  Left Ear: External ear normal.  Nose: Nose normal.  Mouth/Throat: Oropharynx is clear and moist.  Eyes: EOM are normal. Pupils are equal, round, and reactive to light.  Neck: Normal range of motion. Neck supple. No thyromegaly present.  Cardiovascular: Normal rate, regular rhythm and normal heart sounds.   No murmur heard. Pulmonary/Chest: Effort normal and breath sounds normal. No respiratory distress. He has no wheezes.  Abdominal: Soft. Bowel sounds are normal. He exhibits no distension and no mass. There is no tenderness.  Genitourinary: Prostate normal and penis normal.  Musculoskeletal: Normal range of motion. He exhibits no edema.  Lymphadenopathy:    He has no cervical adenopathy.  Neurological: He is alert.  He exhibits normal muscle tone.  Skin: Skin is warm and dry. No erythema.  Psychiatric: He has a normal mood and affect. His behavior is normal. Judgment normal.          Assessment & Plan:    Chronic leg pain/back pain-refills of pain medication given he may use up to 4 times per day. Does not cause drowsiness it helps him function better. Followup 3 months  Prediabetes importance of watching starches bringing his weight down exercising. Recheck A1c again in 6 months time may need medications  Hyperlipidemia doing well with this continue current cholesterol medicine  Blood pressure doing good continue current medication  Annual wellness visit completed patient encouraged to get colonoscopy was given relevant information to get this set in [facility]

## 2014-03-14 LAB — HM DIABETES EYE EXAM

## 2014-04-26 LAB — HM COLONOSCOPY

## 2014-05-03 ENCOUNTER — Encounter: Payer: Self-pay | Admitting: *Deleted

## 2014-05-15 ENCOUNTER — Telehealth: Payer: Self-pay | Admitting: Family Medicine

## 2014-05-15 NOTE — Telephone Encounter (Signed)
Discussed with pt's wife. 

## 2014-05-15 NOTE — Telephone Encounter (Signed)
Please let the patient know that on this particular office visit he will only need a hemoglobin A1c which we will do here in the office. No further lab work necessary on top of that thank you

## 2014-05-15 NOTE — Telephone Encounter (Signed)
Pt needs bw orders if need for appt  Call when ready    Last labs 01/23/14 Lip, hep func, BMP, A1C, PSA Microalbumin urine, CBC

## 2014-05-23 ENCOUNTER — Ambulatory Visit (INDEPENDENT_AMBULATORY_CARE_PROVIDER_SITE_OTHER): Payer: Medicare Other | Admitting: Family Medicine

## 2014-05-23 ENCOUNTER — Encounter: Payer: Self-pay | Admitting: Family Medicine

## 2014-05-23 VITALS — BP 130/88 | Ht 71.0 in | Wt 311.4 lb

## 2014-05-23 DIAGNOSIS — G8929 Other chronic pain: Secondary | ICD-10-CM

## 2014-05-23 DIAGNOSIS — R7309 Other abnormal glucose: Secondary | ICD-10-CM

## 2014-05-23 DIAGNOSIS — R7303 Prediabetes: Secondary | ICD-10-CM

## 2014-05-23 DIAGNOSIS — F1190 Opioid use, unspecified, uncomplicated: Secondary | ICD-10-CM

## 2014-05-23 DIAGNOSIS — M25569 Pain in unspecified knee: Secondary | ICD-10-CM

## 2014-05-23 DIAGNOSIS — M549 Dorsalgia, unspecified: Secondary | ICD-10-CM

## 2014-05-23 LAB — POCT GLYCOSYLATED HEMOGLOBIN (HGB A1C): HEMOGLOBIN A1C: 5.6

## 2014-05-23 MED ORDER — HYDROMORPHONE HCL 4 MG PO TABS: 4.0000 mg | ORAL_TABLET | Freq: Four times a day (QID) | ORAL | Status: DC | PRN

## 2014-05-23 MED ORDER — OMEPRAZOLE 20 MG PO CPDR: DELAYED_RELEASE_CAPSULE | ORAL | Status: DC

## 2014-05-23 MED ORDER — ATORVASTATIN CALCIUM 40 MG PO TABS: 40.0000 mg | ORAL_TABLET | Freq: Every day | ORAL | Status: DC

## 2014-05-23 MED ORDER — LISINOPRIL-HYDROCHLOROTHIAZIDE 10-12.5 MG PO TABS: ORAL_TABLET | ORAL | Status: DC

## 2014-05-23 NOTE — Patient Instructions (Signed)

## 2014-05-23 NOTE — Progress Notes (Signed)
   Subjective:    Patient ID: Steven [name], male    DOB: 23-Feb-1941, 74 y.o.   MRN: [id number]  HPI This patient was seen today for chronic pain  The medication list was reviewed and updated.   -Compliance with pain medication: Yes, but patient thinks he is addicted to it.  The patient was advised the importance of maintaining medication and not using illegal substances with these.  Refills needed: Yes, last filled 04/03/14  The patient was educated that we can provide 3 monthly scripts for their medication, it is their responsibility to follow the instructions.  Side effects or complications from medications: No  Patient is aware that pain medications are meant to minimize the severity of the pain to allow their pain levels to improve to allow for better function. They are aware of that pain medications cannot totally remove their pain.  Due for UDT ( at least once per year) : Next visit  Long discussion held regarding patient's blood pressure, prediabetes, osteoarthritis of knees, lumbar back pain greater than half the questions and half the time was spent discussing questions patient had      Review of Systems    denies chest tightness pressure pain shortness breath rectal bleeding hematuria. Relates chronic low back pain and knee pain. Objective:   Physical Exam  Lungs are clear no crackles heart regular extremities no edema skin warm dry foot exam is normal neck no masses subjective knee pain and lower back pain.      Assessment & Plan:  Chronic lumbar pain uses pain medicine anywhere from 2-4 times per day denies abusing it does help him function.  Chronic bilateral knee pain osteoarthritis uses pain medicine as prescribed above  Prediabetes under decent control continue current measures watch diet  Obesity patient encouraged to try to lose weight was counseled on doing so  HTN decent control

## 2014-06-29 ENCOUNTER — Encounter: Payer: Self-pay | Admitting: Cardiovascular Disease

## 2014-06-29 ENCOUNTER — Ambulatory Visit (INDEPENDENT_AMBULATORY_CARE_PROVIDER_SITE_OTHER): Payer: Medicare Other | Admitting: Cardiovascular Disease

## 2014-06-29 VITALS — BP 132/88 | HR 72 | Ht 71.0 in | Wt 308.0 lb

## 2014-06-29 DIAGNOSIS — E7800 Pure hypercholesterolemia, unspecified: Secondary | ICD-10-CM

## 2014-06-29 DIAGNOSIS — I2510 Atherosclerotic heart disease of native coronary artery without angina pectoris: Secondary | ICD-10-CM

## 2014-06-29 DIAGNOSIS — I10 Essential (primary) hypertension: Secondary | ICD-10-CM

## 2014-06-29 NOTE — Patient Instructions (Signed)
Your physician wants you to follow-up in: 1 YEAR with Dr Cooper.  You will receive a reminder letter in the mail two months in advance. If you don't receive a letter, please call our office to schedule the follow-up appointment.  Your physician recommends that you continue on your current medications as directed. Please refer to the Current Medication list given to you today.  

## 2014-06-29 NOTE — Progress Notes (Signed)
Cardiology Office Note   Date:  06/29/2014   ID:  Steven [name], DOB 1940/08/25, MRN [id number]  PCP:  Kathyrn Drown  Cardiologist:  Sherren Mocha, MD    Chief Complaint  Patient presents with  . Fatigue    History of Present Illness: Steven [name] is a 74 y.o. male who presents for follow-up of coronary artery disease. The patient has been managed medically  With an absence of angina. He also has a remote history of postoperative atrial fibrillation without recurrence.  RD a catheterization in 2011 demonstrated distal branch vessel disease of the right coronary artery, minor nonobstructive stenosis of the LAD, and small vessel occlusion of the distal left circumflex with left to left collaterals. LV function has been normal.  The patient denies any new cardiac symptoms since I saw him last year. He continues to struggle with his weight. He also has a lot of problems with arthritis. He has bilateral knee pain and back problems. He denies chest pain or pressure, shortness of breath, orthopnea, or PND. He does have some leg swelling, worse on the right. He relates this to his knee problems. He's had 2 right knee replacements. He also complains of generalized fatigue.  Past Medical History  Diagnosis Date  . Obesity   . Dyslipidemia   . Coronary artery disease 02-27-11    Dr. Jacquiline Doe follows-has some blocked coronary arteries ,not suitable for stent  placement  . Hypertension   . Shingles outbreak 02-27-11    2 weeks ago , was tx.-only residual is tenderness of right scalp-no open areas  . GERD (gastroesophageal reflux disease) 02-27-11    Acid reflux  . Hemorrhoids 02-27-11    not bothersome at this time  . Hearing loss 02-27-11    Bilateral hearing aids-due to exposure to loud machinery  . Urinary incontinence 02-27-11    not an everday occurrence-no special measures  . Osteoarthritis 02-27-11    Ostearthritis-knees, shoulders.Back causes chronic pain-radiates down  right leg  . Chronic pain   . Hyperlipidemia   . Leg swelling   . Rash   . Complication of anesthesia     pt woke up during last surgery   . Dysrhythmia     hx of atrial fib during last hospitalization   . Shortness of breath     with exertion     Past Surgical History  Procedure Laterality Date  . Replacement total knee  02-27-11    right- 2007  . Total knee revision  03/05/2011    Procedure: TOTAL KNEE REVISION;  Surgeon: Dione Plover Aluisio;  Location: [facility];  Service: Orthopedics;  Laterality: Right;  . Injection knee  03/05/2011    Procedure: KNEE INJECTION;  Surgeon: Gearlean Alf;  Location: [facility];  Service: Orthopedics;  Laterality: Left;  80 mg depomedrol  . Cholecystectomy  03/14/2011    Procedure: LAPAROSCOPIC CHOLECYSTECTOMY;  Surgeon: Edward Jolly, MD;  Location: [facility];  Service: General;  Laterality: N/A;  . Laparoscopy  03/16/2011    Procedure: LAPAROSCOPY DIAGNOSTIC;  Surgeon: Judieth Keens, DO;  Location: [facility];  Service: General;  Laterality: N/A;  repair incisional hernia  . Total knee arthroplasty  03/01/2012    Procedure: TOTAL KNEE ARTHROPLASTY;  Surgeon: Gearlean Alf, MD;  Location: [facility];  Service: Orthopedics;  Laterality: Left;  . Colonoscopy      Current Outpatient Prescriptions  Medication Sig Dispense Refill  . aspirin 81 MG tablet Take 81 mg by  mouth daily.    Marland Kitchen atorvastatin (LIPITOR) 40 MG tablet Take 1 tablet (40 mg total) by mouth daily. 30 tablet 5  . cetirizine (ZYRTEC) 10 MG tablet Take 10 mg by mouth daily.    . cholecalciferol (VITAMIN D) 1000 UNITS tablet Take 1,000 Units by mouth daily.    . Cyanocobalamin (VITAMIN B-12 PO) Take 1,000 mg by mouth daily.    Marland Kitchen HYDROmorphone (DILAUDID) 4 MG tablet Take 1 tablet (4 mg total) by mouth every 6 (six) hours as needed. Pain 120 tablet 0  . lisinopril-hydrochlorothiazide (PRINZIDE,ZESTORETIC) 10-12.5 MG per tablet TAKE (1) TABLET BY MOUTH EACH MORNING. 30 tablet 5  .  Methylcellulose, Laxative, (CITRUCEL) 500 MG TABS Take 1 tablet by mouth daily.     . Omega-3 Fatty Acids (FISH OIL) 1000 MG CAPS Take 1,000 mg by mouth daily.    Marland Kitchen omeprazole (PRILOSEC) 20 MG capsule TAKE ONE CAPSULE BY MOUTH ONCE DAILY. 30 capsule 5   No current facility-administered medications for this visit.    Allergies:   Percocet; Ciprofloxacin; and Oxycodone-acetaminophen   Social History:  The patient  reports that he has quit smoking. He started smoking about 52 years ago. He quit smokeless tobacco use about 41 years ago. His smokeless tobacco use included Chew. He reports that he does not drink alcohol or use illicit drugs.   Family History:  The patient's  family history includes COPD in his father; Cancer in his father; Heart attack in his father; Heart attack (age of onset: 72) in his sister; Heart disease in his mother; Hypertension in his mother; Other (age of onset: 54) in his mother.    ROS:  Please see the history of present illness.  Otherwise, review of systems is positive for leg swelling, hearing loss, back pain, muscle pain, easy bruising, and leg pain.  All other systems are reviewed and negative.    PHYSICAL EXAM: VS:  BP 132/88 mmHg  Pulse 72  Ht 5\' 11"  (1.803 m)  Wt 308 lb (139.708 kg)  BMI 42.98 kg/m2 , BMI Body mass index is 42.98 kg/(m^2). GEN: Well nourished, well developed, in no acute distress HEENT: normal Neck: no JVD, no masses. No carotid bruits Cardiac: RRR without murmur or gallop                Respiratory:  clear to auscultation bilaterally, normal work of breathing GI: soft, nontender, nondistended, + BS MS: no deformity or atrophy Ext: 1+ edema on the right, none on the left, pedal pulses 2+= bilaterally Skin: warm and dry, no rash Neuro:  Strength and sensation are intact Psych: euthymic mood, full affect  EKG:  EKG is ordered today. The ekg ordered today shows 72 bpm, sinus rhythm with 1st degree AV block, otherwise within normal  limits  Recent Labs: 01/23/2014: ALT 15; BUN 23; Creatinine 0.96; Hemoglobin 14.2; Platelets 218; Potassium 4.4; Sodium 139   Lipid Panel     Component Value Date/Time   CHOL 122 01/23/2014 0708   TRIG 83 01/23/2014 0708   HDL 37* 01/23/2014 0708   CHOLHDL 3.3 01/23/2014 0708   VLDL 17 01/23/2014 0708   LDLCALC 68 01/23/2014 0708      Wt Readings from Last 3 Encounters:  06/29/14 308 lb (139.708 kg)  05/23/14 311 lb 6 oz (141.239 kg)  01/30/14 312 lb (141.522 kg)     Cardiac Studies Reviewed: Cardiac catheterization from 2011 was reviewed with details as outlined above.  ASSESSMENT AND PLAN: 1.  CAD, native  vessel: The patient is stable without symptoms of angina. He continues on aspirin and a statin drug. I will see him back in one year.  2. Hyperlipidemia: The patient is on atorvastatin 40 mg daily. He's followed by his primary care physician.  3. Essential hypertension: Blood pressure well controlled on lisinopril/hydrochlorothiazide.   4. Obesity: Lengthy discussion about strategies aimed at weight loss. We discussed use of a recumbent bike because of his osteoarthritis. Also discussed dietary modification. I'm not optimistic that he is going to be able to lose weight.  Current medicines are reviewed with the patient today.  The patient does not have concerns regarding medicines.  The following changes have been made:  no change  Labs/ tests ordered today include:   Orders Placed This Encounter  Procedures  . EKG 12-Lead    Disposition:   FU one year  Signed, Sherren Mocha, MD  06/29/2014 6:14 PM    [facility] [address], [place], [place]  [address] Phone: [phone]; Fax: [phone]

## 2014-07-14 DIAGNOSIS — R569 Unspecified convulsions: Secondary | ICD-10-CM

## 2014-07-14 HISTORY — DX: Unspecified convulsions: R56.9

## 2014-07-21 ENCOUNTER — Telehealth: Payer: Self-pay | Admitting: Family Medicine

## 2014-07-21 DIAGNOSIS — R7303 Prediabetes: Secondary | ICD-10-CM

## 2014-07-21 DIAGNOSIS — E785 Hyperlipidemia, unspecified: Secondary | ICD-10-CM

## 2014-07-21 DIAGNOSIS — Z79899 Other long term (current) drug therapy: Secondary | ICD-10-CM

## 2014-07-21 NOTE — Telephone Encounter (Signed)
Lipid, liver, metabolic 7, hemoglobin C1U

## 2014-07-21 NOTE — Telephone Encounter (Signed)
Pt has appt 5/2 would like to know if he needs BW done   Last labs 01/23/14 Lipid, Hep func, BMP, A1C, PSA, CBC, Micro Albumin urine

## 2014-07-21 NOTE — Telephone Encounter (Signed)
Pt notified bw orders ready and to go do bloodwork at [place].

## 2014-08-01 ENCOUNTER — Other Ambulatory Visit: Payer: Self-pay | Admitting: Family Medicine

## 2014-08-09 LAB — BASIC METABOLIC PANEL
BUN/Creatinine Ratio: 29 — ABNORMAL HIGH (ref 10–22)
BUN: 29 mg/dL — ABNORMAL HIGH (ref 8–27)
CHLORIDE: 99 mmol/L (ref 97–108)
CO2: 23 mmol/L (ref 18–29)
CREATININE: 1.01 mg/dL (ref 0.76–1.27)
Calcium: 9.1 mg/dL (ref 8.6–10.2)
GFR calc Af Amer: 86 mL/min/{1.73_m2} (ref >59)
GFR, EST NON AFRICAN AMERICAN: 74 mL/min/{1.73_m2} (ref >59)
Glucose: 115 mg/dL — ABNORMAL HIGH (ref 65–99)
POTASSIUM: 4.7 mmol/L (ref 3.5–5.2)
SODIUM: 137 mmol/L (ref 134–144)

## 2014-08-09 LAB — LIPID PANEL
Chol/HDL Ratio: 3.3 ratio units (ref 0.0–5.0)
Cholesterol, Total: 137 mg/dL (ref 100–199)
HDL: 41 mg/dL (ref >39)
LDL Calculated: 78 mg/dL (ref 0–99)
Triglycerides: 89 mg/dL (ref 0–149)
VLDL Cholesterol Cal: 18 mg/dL (ref 5–40)

## 2014-08-09 LAB — HEMOGLOBIN A1C
Est. average glucose Bld gHb Est-mCnc: 123 mg/dL
HEMOGLOBIN A1C: 5.9 % — AB (ref 4.8–5.6)

## 2014-08-09 LAB — HEPATIC FUNCTION PANEL
ALBUMIN: 4.3 g/dL (ref 3.5–4.8)
ALT: 22 IU/L (ref 0–44)
AST: 28 IU/L (ref 0–40)
Alkaline Phosphatase: 82 IU/L (ref 39–117)
BILIRUBIN, DIRECT: 0.2 mg/dL (ref 0.00–0.40)
Bilirubin Total: 0.7 mg/dL (ref 0.0–1.2)
TOTAL PROTEIN: 6.8 g/dL (ref 6.0–8.5)

## 2014-08-14 ENCOUNTER — Encounter: Payer: Self-pay | Admitting: Family Medicine

## 2014-08-14 ENCOUNTER — Ambulatory Visit (INDEPENDENT_AMBULATORY_CARE_PROVIDER_SITE_OTHER): Payer: Medicare Other | Admitting: Family Medicine

## 2014-08-14 VITALS — BP 128/82 | Ht 71.0 in | Wt 305.4 lb

## 2014-08-14 DIAGNOSIS — G8929 Other chronic pain: Secondary | ICD-10-CM | POA: Diagnosis not present

## 2014-08-14 DIAGNOSIS — G4733 Obstructive sleep apnea (adult) (pediatric): Secondary | ICD-10-CM | POA: Diagnosis not present

## 2014-08-14 DIAGNOSIS — R7303 Prediabetes: Secondary | ICD-10-CM

## 2014-08-14 DIAGNOSIS — E785 Hyperlipidemia, unspecified: Secondary | ICD-10-CM

## 2014-08-14 DIAGNOSIS — M25569 Pain in unspecified knee: Secondary | ICD-10-CM

## 2014-08-14 DIAGNOSIS — R7309 Other abnormal glucose: Secondary | ICD-10-CM

## 2014-08-14 MED ORDER — HYDROMORPHONE HCL 4 MG PO TABS: 4.0000 mg | ORAL_TABLET | Freq: Four times a day (QID) | ORAL | Status: DC | PRN

## 2014-08-14 NOTE — Patient Instructions (Signed)

## 2014-08-14 NOTE — Progress Notes (Signed)
   Subjective:    Patient ID: Steven [name], male    DOB: 10-08-1940, 74 y.o.   MRN: [id number]  HPI  This patient was seen today for chronic pain  The medication list was reviewed and updated.   -Compliance with pain medication: yes  The patient was advised the importance of maintaining medication and not using illegal substances with these.  Refills needed: yes The patient was educated that we can provide 3 monthly scripts for their medication, it is their responsibility to follow the instructions.  Side effects or complications from medications:none  Patient is aware that pain medications are meant to minimize the severity of the pain to allow their pain levels to improve to allow for better function. They are aware of that pain medications cannot totally remove their pain.  Due for UDT ( at least once per year) : next visit       Review of Systems  Constitutional: Negative for activity change, appetite change and fatigue.  HENT: Negative for congestion.   Respiratory: Negative for cough.   Cardiovascular: Negative for chest pain.  Gastrointestinal: Negative for abdominal pain.  Endocrine: Negative for polydipsia and polyphagia.  Neurological: Negative for weakness.  Psychiatric/Behavioral: Negative for confusion.       Objective:   Physical Exam  Constitutional: He appears well-nourished. No distress.  Cardiovascular: Normal rate, regular rhythm and normal heart sounds.   No murmur heard. Pulmonary/Chest: Effort normal and breath sounds normal. No respiratory distress.  Musculoskeletal: He exhibits no edema.  Lymphadenopathy:    He has no cervical adenopathy.  Neurological: He is alert.  Psychiatric: His behavior is normal.  Vitals reviewed.         Assessment & Plan:  1. Chronic knee pain, unspecified laterality This patient some pain medication for chronic arthritis in his knee as well as his back. The medication no longer does as well as he used to. We  discussed long-acting pain medications as well as pain management  He would like to think about that for now he states that if it does get worse he would be willing to see pain management. 3 prescriptions given follow-up in 3 months.  2. Obstructive sleep apnea The patient has enough symptoms of daytime fatigue tiredness sleepiness as well as severe snoring at night and questionable pauses with breathing that it would be best for this patient to go ahead with a sleep apnea test. - Ambulatory referral to Sleep Studies  3. Hyperlipidemia Hyperlipidemia lab work looks good. Continue medication watch diet closely  4. Prediabetes A1c actually a little worse than what was I recommend this patient watch starches closely continue to monitor this area  25 minutes spent with patient [id number] greater than half in discussion of these issues

## 2014-09-18 ENCOUNTER — Other Ambulatory Visit (HOSPITAL_COMMUNITY): Payer: Self-pay | Admitting: Respiratory Therapy

## 2014-09-18 DIAGNOSIS — R0683 Snoring: Secondary | ICD-10-CM

## 2014-09-18 DIAGNOSIS — G4730 Sleep apnea, unspecified: Secondary | ICD-10-CM

## 2014-09-18 DIAGNOSIS — R5383 Other fatigue: Secondary | ICD-10-CM

## 2014-09-21 ENCOUNTER — Ambulatory Visit: Payer: Medicare Other | Attending: Family Medicine | Admitting: Sleep Medicine

## 2014-09-21 VITALS — Ht 70.0 in | Wt 304.0 lb

## 2014-09-21 DIAGNOSIS — R0683 Snoring: Secondary | ICD-10-CM | POA: Diagnosis present

## 2014-09-21 DIAGNOSIS — R5383 Other fatigue: Secondary | ICD-10-CM | POA: Insufficient documentation

## 2014-09-21 DIAGNOSIS — G4730 Sleep apnea, unspecified: Secondary | ICD-10-CM | POA: Insufficient documentation

## 2014-09-25 NOTE — Sleep Study (Signed)
  [place] A. Merlene Laughter, MD     [email or web address]        NOCTURNAL POLYSOMNOGRAM    LOCATION: SLEEP LAB FACILITY: [facility]   PHYSICIAN: Jaquon Gingerich A. Merlene Laughter, M.D.   DATE OF STUDY: 09/21/2014.   REFERRING PHYSICIAN: Sallee Lange.   INDICATIONS: The patient is a 74 year old presents with loud snoring, restless sleep, hypersomnia and daytime fatigue.  MEDICATIONS:  Prior to Admission medications   Medication Sig Start Date End Date Taking? Authorizing Provider  aspirin 81 MG tablet Take 81 mg by mouth daily.    Historical Provider, MD  atorvastatin (LIPITOR) 40 MG tablet TAKE ONE TABLET BY MOUTH ONCE DAILY. 08/01/14   Kathyrn Drown, MD  cetirizine (ZYRTEC) 10 MG tablet Take 10 mg by mouth daily.    Historical Provider, MD  cholecalciferol (VITAMIN D) 1000 UNITS tablet Take 1,000 Units by mouth daily.    Historical Provider, MD  Cyanocobalamin (VITAMIN B-12 PO) Take 1,000 mg by mouth daily.    Historical Provider, MD  HYDROmorphone (DILAUDID) 4 MG tablet Take 1 tablet (4 mg total) by mouth every 6 (six) hours as needed. Pain 08/14/14   Kathyrn Drown, MD  lisinopril-hydrochlorothiazide (PRINZIDE,ZESTORETIC) 10-12.5 MG per tablet TAKE (1) TABLET BY MOUTH EACH MORNING. 05/23/14   Kathyrn Drown, MD  Methylcellulose, Laxative, (CITRUCEL) 500 MG TABS Take 1 tablet by mouth daily.     Historical Provider, MD  Omega-3 Fatty Acids (FISH OIL) 1000 MG CAPS Take 1,000 mg by mouth daily.    Historical Provider, MD  omeprazole (PRILOSEC) 20 MG capsule TAKE ONE CAPSULE BY MOUTH ONCE DAILY. 05/23/14   Kathyrn Drown, MD      EPWORTH SLEEPINESS SCALE: 8.   BMI: 44.   ARCHITECTURAL SUMMARY: Total recording time was 431 minutes. Sleep efficiency 49 %. Sleep latency 68 minutes. REM latency 120 minutes. Stage NI 18 %, N2 72 % and N3 0 % and REM sleep 10 %.    RESPIRATORY DATA:  The study is a split-night recording with the initial portion been a diagnostic and the second a titration  recording. Baseline oxygen saturation is 95 %. The lowest saturation is 81 %. The diagnostic AHI is 81. The patient was placed on positive pressures between 5-10. The optimal pressure is 10 with resolution of obstructive events and good tolerance.  LIMB MOVEMENT SUMMARY: PLM index 0.   ELECTROCARDIOGRAM SUMMARY: Average heart rate is 67 with no significant dysrhythmias observed.   IMPRESSION:  1. Severe obstructive sleep apnea syndrome which responds well to a CPAP of 10. 2. Abnormal sleep architecture with absent slow-wave sleep and increase non-REM stage II sleep.  Thanks for this referral.  Shanele Nissan A. Merlene Laughter, M.D. Diplomat, Tax adviser of Sleep Medicine.

## 2014-10-07 ENCOUNTER — Telehealth: Payer: Self-pay | Admitting: Family Medicine

## 2014-10-07 DIAGNOSIS — G4733 Obstructive sleep apnea (adult) (pediatric): Secondary | ICD-10-CM | POA: Insufficient documentation

## 2014-10-07 NOTE — Telephone Encounter (Signed)
This patient had sleep study completed. This sleep study shows that he does have sleep apnea it is recommended for his CPAP machine to cover his sleep apnea uses CPAP of 10. Please assist patient at getting this filled. He may need to get [facility] via [facility] or equipment provider of his choice. The sleep study is in the electronic records if it needs to be sent with the order. Thank you

## 2014-10-10 NOTE — Telephone Encounter (Signed)
Notified patient sleep study completed. This sleep study shows that he does have sleep apnea it is recommended for his CPAP machine to cover his sleep apnea uses CPAP of 10. Patient verbalized understanding and agreed to use [facility]. RX for Cpap machine and sleep study was faxed to [facility] at [phone].

## 2014-10-10 NOTE — Telephone Encounter (Signed)
[facility]

## 2014-10-13 ENCOUNTER — Other Ambulatory Visit (HOSPITAL_COMMUNITY): Payer: Self-pay | Admitting: Respiratory Therapy

## 2014-10-17 ENCOUNTER — Other Ambulatory Visit: Payer: Self-pay | Admitting: Family Medicine

## 2014-10-31 ENCOUNTER — Other Ambulatory Visit: Payer: Self-pay | Admitting: Family Medicine

## 2014-11-09 ENCOUNTER — Other Ambulatory Visit: Payer: Self-pay | Admitting: Family Medicine

## 2014-11-14 ENCOUNTER — Ambulatory Visit (INDEPENDENT_AMBULATORY_CARE_PROVIDER_SITE_OTHER): Payer: Medicare Other | Admitting: Family Medicine

## 2014-11-14 ENCOUNTER — Encounter: Payer: Self-pay | Admitting: Family Medicine

## 2014-11-14 VITALS — BP 120/86 | Ht 71.0 in | Wt 300.1 lb

## 2014-11-14 DIAGNOSIS — G4733 Obstructive sleep apnea (adult) (pediatric): Secondary | ICD-10-CM | POA: Diagnosis not present

## 2014-11-14 DIAGNOSIS — R0789 Other chest pain: Secondary | ICD-10-CM

## 2014-11-14 DIAGNOSIS — Z79891 Long term (current) use of opiate analgesic: Secondary | ICD-10-CM

## 2014-11-14 DIAGNOSIS — I10 Essential (primary) hypertension: Secondary | ICD-10-CM | POA: Diagnosis not present

## 2014-11-14 DIAGNOSIS — M170 Bilateral primary osteoarthritis of knee: Secondary | ICD-10-CM

## 2014-11-14 DIAGNOSIS — I2510 Atherosclerotic heart disease of native coronary artery without angina pectoris: Secondary | ICD-10-CM | POA: Diagnosis not present

## 2014-11-14 LAB — POCT GLYCOSYLATED HEMOGLOBIN (HGB A1C): HEMOGLOBIN A1C: 5.7

## 2014-11-14 MED ORDER — HYDROMORPHONE HCL 4 MG PO TABS: 4.0000 mg | ORAL_TABLET | Freq: Four times a day (QID) | ORAL | Status: DC | PRN

## 2014-11-14 MED ORDER — OMEPRAZOLE 20 MG PO CPDR: DELAYED_RELEASE_CAPSULE | ORAL | Status: DC

## 2014-11-14 MED ORDER — LISINOPRIL-HYDROCHLOROTHIAZIDE 10-12.5 MG PO TABS: ORAL_TABLET | ORAL | Status: DC

## 2014-11-14 NOTE — Patient Instructions (Signed)

## 2014-11-14 NOTE — Progress Notes (Signed)
Subjective:    Patient ID: Steven [name], male    DOB: Sep 11, 1940, 74 y.o.   MRN: [id number]  Knee Pain  The incident occurred more than 1 week ago. Treatments tried: Dauladid  The treatment provided moderate relief.  Patient in today for appointment for chronic knee pain over the past year were reviewed.  current specialist the patient sees on a regular basis: none   This patient was seen today for chronic pain  The medication list was reviewed and updated.   -Compliance with pain medication: yes  The patient was advised the importance of maintaining medication and not using illegal substances with these.  Refills needed: yes  The patient was educated that we can provide 3 monthly scripts for their medication, it is their responsibility to follow the instructions.  Side effects or complications from medications: none  Patient is aware that pain medications are meant to minimize the severity of the pain to allow their pain levels to improve to allow for better function. They are aware of that pain medications cannot totally remove their pain.  Due for UDT ( at least once per year) : taken today.  Hemoglobin A1c drawn today results: 5.7  Patient states no other concerns this visit.  Watching diet Taking meds Some left side chest pain no doe/sob lasted about 15 min last week This patient denies any chest tightness pressure shortness of breath just states he had a, left side the chest lasted 10-15 minutes he thought it might of been related to heartburn he states he's had none since. His EKG today looks good.   Review of Systems  Constitutional: Negative for activity change, appetite change and fatigue.  HENT: Negative for congestion.   Respiratory: Negative for cough.   Cardiovascular: Positive for chest pain. Negative for leg swelling.  Gastrointestinal: Negative for abdominal pain.  Endocrine: Negative for polydipsia and polyphagia.  Musculoskeletal: Positive for back  pain and arthralgias.  Neurological: Negative for weakness.  Psychiatric/Behavioral: Negative for confusion.       Objective:   Physical Exam  Constitutional: He appears well-nourished. No distress.  Cardiovascular: Normal rate, regular rhythm and normal heart sounds.   No murmur heard. Pulmonary/Chest: Effort normal and breath sounds normal. No respiratory distress.  Musculoskeletal: He exhibits no edema.  Lymphadenopathy:    He has no cervical adenopathy.  Neurological: He is alert.  Psychiatric: His behavior is normal.  Vitals reviewed. right knee hx replacement no instability        Assessment & Plan:  1. Primary osteoarthritis of both knees Currently uses pain medication up to 4 times a day denies a causing drowsiness  2. Morbid obesity Patient try to watch diet try to lose some weight very difficult for him to do so - POCT HgB A1C  3. Essential hypertension Blood pressure under good control continue current measures  4. Obstructive sleep apnea Using CPAP at times has difficult time with fitting in the mask I told him to call [facility] if ongoing troubles if he's not adapting well over the next several months to this then the next step would be referral to a sleep apnea specialist  5. Atherosclerosis of native coronary artery of native heart without angina pectoris Patient having some atypical left-sided chest pain does not sound anginal EKG looks good I told him if he has ongoing troubles with this he ought to follow-up with his cardiologist.  6. Other chest pain EKG looks good. - EKG 12-Lead  7.  Long-term current use of opiate analgesic Patient does not abuse medication urine drug screen today 3 prescriptions were written 1 was given I went over the pain contract with the patient - Tox[employer] Select 13 (MW), Urine

## 2014-11-15 ENCOUNTER — Ambulatory Visit: Payer: Medicare Other | Admitting: Family Medicine

## 2014-11-18 LAB — TOXASSURE SELECT 13 (MW), URINE: PDF: 0

## 2014-12-06 ENCOUNTER — Ambulatory Visit: Payer: Medicare Other | Admitting: Family Medicine

## 2015-01-01 ENCOUNTER — Telehealth: Payer: Self-pay | Admitting: Family Medicine

## 2015-01-01 DIAGNOSIS — I10 Essential (primary) hypertension: Secondary | ICD-10-CM

## 2015-01-01 DIAGNOSIS — Z13220 Encounter for screening for lipoid disorders: Secondary | ICD-10-CM

## 2015-01-01 DIAGNOSIS — Z862 Personal history of diseases of the blood and blood-forming organs and certain disorders involving the immune mechanism: Secondary | ICD-10-CM

## 2015-01-01 DIAGNOSIS — Z125 Encounter for screening for malignant neoplasm of prostate: Secondary | ICD-10-CM

## 2015-01-01 DIAGNOSIS — R7303 Prediabetes: Secondary | ICD-10-CM

## 2015-01-01 NOTE — Telephone Encounter (Signed)
Pt has wellness for oct needs labs   Last labs 10/12/1 PSA, A1C, BMP, Hep, Lip microalbumin urine

## 2015-01-01 NOTE — Telephone Encounter (Signed)
[facility] 01/01/15. Labs ordered in epic

## 2015-01-01 NOTE — Telephone Encounter (Signed)
Metabolic 7, liver, lipid, PSA, CBC, hemoglobin A1c-prediabetes, hypertension hyperlipidemia screening, history of anemia

## 2015-01-02 NOTE — Telephone Encounter (Signed)
Spoke with patient and informed him per Dr.Scott lab work has been ordered. Patient verbalized understanding.

## 2015-02-02 LAB — CBC WITH DIFFERENTIAL/PLATELET
BASOS ABS: 0 10*3/uL (ref 0.0–0.2)
Basos: 0 %
EOS (ABSOLUTE): 0.2 10*3/uL (ref 0.0–0.4)
Eos: 2 %
Hematocrit: 42.2 % (ref 37.5–51.0)
Hemoglobin: 14.4 g/dL (ref 12.6–17.7)
IMMATURE GRANS (ABS): 0 10*3/uL (ref 0.0–0.1)
IMMATURE GRANULOCYTES: 0 %
LYMPHS: 15 %
Lymphocytes Absolute: 1.1 10*3/uL (ref 0.7–3.1)
MCH: 31.1 pg (ref 26.6–33.0)
MCHC: 34.1 g/dL (ref 31.5–35.7)
MCV: 91 fL (ref 79–97)
Monocytes Absolute: 0.5 10*3/uL (ref 0.1–0.9)
Monocytes: 6 %
NEUTROS ABS: 5.4 10*3/uL (ref 1.4–7.0)
NEUTROS PCT: 77 %
PLATELETS: 215 10*3/uL (ref 150–379)
RBC: 4.63 x10E6/uL (ref 4.14–5.80)
RDW: 13.4 % (ref 12.3–15.4)
WBC: 7.1 10*3/uL (ref 3.4–10.8)

## 2015-02-02 LAB — BASIC METABOLIC PANEL
BUN/Creatinine Ratio: 22 (ref 10–22)
BUN: 22 mg/dL (ref 8–27)
CALCIUM: 9.4 mg/dL (ref 8.6–10.2)
CHLORIDE: 98 mmol/L (ref 97–106)
CO2: 24 mmol/L (ref 18–29)
Creatinine, Ser: 0.98 mg/dL (ref 0.76–1.27)
GFR calc Af Amer: 88 mL/min/{1.73_m2} (ref >59)
GFR calc non Af Amer: 76 mL/min/{1.73_m2} (ref >59)
Glucose: 111 mg/dL — ABNORMAL HIGH (ref 65–99)
Potassium: 4.7 mmol/L (ref 3.5–5.2)
Sodium: 138 mmol/L (ref 136–144)

## 2015-02-02 LAB — PSA: Prostate Specific Ag, Serum: 0.2 ng/mL (ref 0.0–4.0)

## 2015-02-02 LAB — HEPATIC FUNCTION PANEL
ALBUMIN: 4 g/dL (ref 3.5–4.8)
ALK PHOS: 77 IU/L (ref 39–117)
ALT: 18 IU/L (ref 0–44)
AST: 21 IU/L (ref 0–40)
Bilirubin Total: 0.6 mg/dL (ref 0.0–1.2)
Bilirubin, Direct: 0.19 mg/dL (ref 0.00–0.40)
Total Protein: 6.4 g/dL (ref 6.0–8.5)

## 2015-02-02 LAB — LIPID PANEL
CHOL/HDL RATIO: 3.3 ratio (ref 0.0–5.0)
Cholesterol, Total: 130 mg/dL (ref 100–199)
HDL: 39 mg/dL — ABNORMAL LOW (ref >39)
LDL Calculated: 67 mg/dL (ref 0–99)
TRIGLYCERIDES: 118 mg/dL (ref 0–149)
VLDL Cholesterol Cal: 24 mg/dL (ref 5–40)

## 2015-02-02 LAB — HEMOGLOBIN A1C
ESTIMATED AVERAGE GLUCOSE: 123 mg/dL
HEMOGLOBIN A1C: 5.9 % — AB (ref 4.8–5.6)

## 2015-02-12 ENCOUNTER — Ambulatory Visit (INDEPENDENT_AMBULATORY_CARE_PROVIDER_SITE_OTHER): Payer: Medicare Other | Admitting: Family Medicine

## 2015-02-12 ENCOUNTER — Encounter: Payer: Self-pay | Admitting: Family Medicine

## 2015-02-12 VITALS — BP 130/84 | Ht 71.0 in | Wt 304.6 lb

## 2015-02-12 DIAGNOSIS — E7800 Pure hypercholesterolemia, unspecified: Secondary | ICD-10-CM

## 2015-02-12 DIAGNOSIS — Z0000 Encounter for general adult medical examination without abnormal findings: Secondary | ICD-10-CM

## 2015-02-12 DIAGNOSIS — Z23 Encounter for immunization: Secondary | ICD-10-CM

## 2015-02-12 DIAGNOSIS — R1030 Lower abdominal pain, unspecified: Secondary | ICD-10-CM

## 2015-02-12 DIAGNOSIS — M25569 Pain in unspecified knee: Secondary | ICD-10-CM | POA: Diagnosis not present

## 2015-02-12 DIAGNOSIS — G8929 Other chronic pain: Secondary | ICD-10-CM | POA: Diagnosis not present

## 2015-02-12 DIAGNOSIS — I10 Essential (primary) hypertension: Secondary | ICD-10-CM

## 2015-02-12 DIAGNOSIS — G4733 Obstructive sleep apnea (adult) (pediatric): Secondary | ICD-10-CM

## 2015-02-12 DIAGNOSIS — R7303 Prediabetes: Secondary | ICD-10-CM

## 2015-02-12 NOTE — Progress Notes (Signed)
Subjective:    Patient ID: Steven [name], male    DOB: 02/14/1941, 74 y.o.   MRN: [id number]  HPI AWV- Annual Wellness Visit  The patient was seen for their annual wellness visit. The patient's past medical history, surgical history, and family history were reviewed. Pertinent vaccines were reviewed ( tetanus, pneumonia, shingles, flu) The patient's medication list was reviewed and updated.  The height and weight were entered. The patient's current BMI is: 42.48  Cognitive screening was completed. Outcome of Mini - Cog: pass  Falls within the past 6 months none  Current tobacco usage: none (All patients who use tobacco were given written and verbal information on quitting)  Recent listing of emergency department/hospitalizations over the past year were reviewed.  current specialist the patient sees on a regular basis: no   Medicare annual wellness visit patient questionnaire was reviewed.  A written screening schedule for the patient for the next 5-10 years was given. Appropriate discussion of followup regarding next visit was discussed.   Patient reports stomach issues and continued knee pain Patient states he is using sleep apnea machine on a regular basis helps him feel better he is finding benefit with it.  Patient does take his blood pressure medicine regular basis tries watch salt diet try to stay physically active Patient does take his cholesterol medicine. Recent lab work. Patient does try to eat somewhat healthy Patient has chronic bilateral knee pain is had previous surgery takes pain medicine 3 or 4 times a day tolerates it well does not cause drowsiness does not feel he overuses it. He cannot tolerate other pain medicines including Percocet Patient having significant lower abdominal pain over the past 4-5 months aching lasting anywhere from 20 minutes to a few hours sometimes has to take pain medicine to get go away mainly in the lower abdomen happens 3-4 times per  week for the past 4-5 months is tried omeprazole tried Tagamet and Tums and other issues without help. Patient denies blood in stool. Up-to-date on colonoscopy.   Review of Systems  Constitutional: Negative for fever, activity change and appetite change.  HENT: Negative for congestion and rhinorrhea.   Eyes: Negative for discharge.  Respiratory: Negative for cough and wheezing.   Cardiovascular: Negative for chest pain.  Gastrointestinal: Positive for abdominal pain. Negative for vomiting and blood in stool.  Genitourinary: Negative for frequency and difficulty urinating.  Musculoskeletal: Positive for back pain, joint swelling and arthralgias. Negative for neck pain.  Skin: Negative for rash.  Allergic/Immunologic: Negative for environmental allergies and food allergies.  Neurological: Negative for weakness and headaches.  Psychiatric/Behavioral: Negative for agitation.       Objective:   Physical Exam  Constitutional: He appears well-developed and well-nourished.  HENT:  Head: Normocephalic and atraumatic.  Right Ear: External ear normal.  Left Ear: External ear normal.  Nose: Nose normal.  Mouth/Throat: Oropharynx is clear and moist.  Eyes: EOM are normal. Pupils are equal, round, and reactive to light.  Neck: Normal range of motion. Neck supple. No thyromegaly present.  Cardiovascular: Normal rate, regular rhythm and normal heart sounds.   No murmur heard. Pulmonary/Chest: Effort normal and breath sounds normal. No respiratory distress. He has no wheezes.  Abdominal: Soft. Bowel sounds are normal. He exhibits no distension and no mass. There is no tenderness.  Genitourinary: Penis normal.  Musculoskeletal: Normal range of motion. He exhibits no edema.  Lymphadenopathy:    He has no cervical adenopathy.  Neurological: He is alert. He  exhibits normal muscle tone.  Skin: Skin is warm and dry. No erythema.  Psychiatric: He has a normal mood and affect. His behavior is normal.  Judgment normal.   It should be noted that multiple additional topics were covered on today's visit.25 minutes was spent with the patient. Greater than half the time was spent in discussion and answering questions and counseling regarding the issues that the patient came in for today.        Assessment & Plan:  Patient has sleep apnea using machine. Feels that overall is doing well. Feels more rested during the day  Blood pressure good control continue current medications watch diet  Prediabetes fairly decent control watch diet stay active  Chronic knee pain bilateral takes pain medicine 3 or 4 per day states it helps with the pain  Lower abdominal pain present over the past 4-5 months for no good reason 3-4 times per week severe enough to have you take pain medication for. Pain will last for several hours in the lower abdomen achhing and knawing painLab work looks good I would recommend CT scan abdomen and pelvis. Patient up-to-date on colonoscopy  Wellness safety dietary reviewed flu vaccine today up-to-date on pneumonia vaccine. No recent falls no depression

## 2015-02-14 ENCOUNTER — Ambulatory Visit: Payer: Medicare Other | Admitting: Family Medicine

## 2015-02-19 ENCOUNTER — Ambulatory Visit (HOSPITAL_COMMUNITY)
Admission: RE | Admit: 2015-02-19 | Discharge: 2015-02-19 | Disposition: A | Discharge status: Home / Self Care | Payer: Medicare Other | Source: Ambulatory Visit | Attending: Family Medicine | Admitting: Family Medicine

## 2015-02-19 DIAGNOSIS — K5730 Diverticulosis of large intestine without perforation or abscess without bleeding: Secondary | ICD-10-CM | POA: Insufficient documentation

## 2015-02-19 DIAGNOSIS — R1032 Left lower quadrant pain: Secondary | ICD-10-CM | POA: Insufficient documentation

## 2015-02-19 DIAGNOSIS — Z9049 Acquired absence of other specified parts of digestive tract: Secondary | ICD-10-CM | POA: Insufficient documentation

## 2015-02-19 MED ORDER — IOHEXOL 300 MG/ML  SOLN
100.0000 mL | Freq: Once | INTRAMUSCULAR | Status: AC | PRN
  Administered 2015-02-19: 100 mL via INTRAVENOUS

## 2015-03-02 ENCOUNTER — Telehealth: Payer: Self-pay | Admitting: Family Medicine

## 2015-03-02 NOTE — Telephone Encounter (Signed)
Patient's wife Sergey Craigen called back stating that she spoke with [facility] and they stated that the office note from 02/12/15 should be ok for them to reinstate the CPAP machine. They just need to ensure that the note says that he is using the machine and it is working. Patient wife says that she was told that someone from [facility] with contact us to get copies of office note.

## 2015-03-02 NOTE — Telephone Encounter (Signed)
Discussed with patient's wife Malyki Lechtenberg) (HIPAA was verified). Fraser Din states she will call us back today to schedule appointment. She has to find out exactly when patient had device setup so we can schedule appointment between 31st and 90th day.

## 2015-03-02 NOTE — Telephone Encounter (Signed)
Nurse's-Please contact patient let him know that insurance is requiring a office visit to discuss how well he is doing with his CPAP machine. According to [facility] if this office visit does not happen then they will not pay for his CPAP machine. They have stated that this visit must take place somewhere between the 31st day and 90th the day of this set up of the device.(Obviously please try to find out from the patient when the device was set up for him and then make sure patient schedules office visit between 31st and 90th day.)(Note to self-they are requiring documentation at that visit of compliance for or greater hours 70% of the time at least 21 out of 30 days plus clinical improvement noted by the patient they are requesting that this information be sent via fax to [facility] of [facility] [phone]. Their phone number is [phone] extension [phone])

## 2015-03-11 ENCOUNTER — Encounter: Payer: Self-pay | Admitting: Family Medicine

## 2015-03-22 ENCOUNTER — Telehealth: Payer: Self-pay | Admitting: Family Medicine

## 2015-03-22 NOTE — Telephone Encounter (Signed)
Pt is requesting a refill on his dilaudid.

## 2015-03-22 NOTE — Telephone Encounter (Signed)
After 4 pm now, Steven [name] to see in [place]

## 2015-03-23 MED ORDER — HYDROMORPHONE HCL 4 MG PO TABS: 4.0000 mg | ORAL_TABLET | Freq: Four times a day (QID) | ORAL | Status: DC | PRN

## 2015-03-23 NOTE — Telephone Encounter (Signed)
Left message on voicemail notifying patient script ready for pickup.  

## 2015-03-23 NOTE — Telephone Encounter (Signed)
Patient may have a refill of this medicine

## 2015-04-24 ENCOUNTER — Other Ambulatory Visit: Payer: Self-pay | Admitting: Family Medicine

## 2015-04-27 ENCOUNTER — Other Ambulatory Visit: Payer: Self-pay | Admitting: Family Medicine

## 2015-04-27 MED ORDER — HYDROMORPHONE HCL 4 MG PO TABS: 4.0000 mg | ORAL_TABLET | Freq: Four times a day (QID) | ORAL | Status: DC | PRN

## 2015-05-15 DIAGNOSIS — Z96651 Presence of right artificial knee joint: Secondary | ICD-10-CM | POA: Diagnosis not present

## 2015-05-15 DIAGNOSIS — Z471 Aftercare following joint replacement surgery: Secondary | ICD-10-CM | POA: Diagnosis not present

## 2015-05-25 DIAGNOSIS — H2513 Age-related nuclear cataract, bilateral: Secondary | ICD-10-CM | POA: Diagnosis not present

## 2015-05-25 DIAGNOSIS — H43813 Vitreous degeneration, bilateral: Secondary | ICD-10-CM | POA: Diagnosis not present

## 2015-05-25 DIAGNOSIS — H52203 Unspecified astigmatism, bilateral: Secondary | ICD-10-CM | POA: Diagnosis not present

## 2015-05-25 DIAGNOSIS — H25013 Cortical age-related cataract, bilateral: Secondary | ICD-10-CM | POA: Diagnosis not present

## 2015-06-04 ENCOUNTER — Encounter: Payer: Self-pay | Admitting: Family Medicine

## 2015-06-04 ENCOUNTER — Ambulatory Visit (INDEPENDENT_AMBULATORY_CARE_PROVIDER_SITE_OTHER): Payer: PPO | Admitting: Family Medicine

## 2015-06-04 VITALS — BP 134/84 | Ht 71.0 in | Wt 299.8 lb

## 2015-06-04 DIAGNOSIS — G8929 Other chronic pain: Secondary | ICD-10-CM

## 2015-06-04 DIAGNOSIS — J019 Acute sinusitis, unspecified: Secondary | ICD-10-CM | POA: Diagnosis not present

## 2015-06-04 DIAGNOSIS — M549 Dorsalgia, unspecified: Secondary | ICD-10-CM

## 2015-06-04 DIAGNOSIS — M25569 Pain in unspecified knee: Secondary | ICD-10-CM

## 2015-06-04 DIAGNOSIS — B9689 Other specified bacterial agents as the cause of diseases classified elsewhere: Secondary | ICD-10-CM

## 2015-06-04 MED ORDER — HYDROMORPHONE HCL 4 MG PO TABS: 4.0000 mg | ORAL_TABLET | Freq: Four times a day (QID) | ORAL | Status: DC | PRN

## 2015-06-04 MED ORDER — AMOXICILLIN-POT CLAVULANATE 875-125 MG PO TABS: 1.0000 | ORAL_TABLET | Freq: Two times a day (BID) | ORAL | Status: DC

## 2015-06-04 NOTE — Progress Notes (Signed)
   Subjective:    Patient ID: Steven [name], male    DOB: 1940-05-02, 75 y.o.   MRN: [id number]  Hypertension This is a chronic problem. The current episode started more than 1 year ago. Risk factors for coronary artery disease include dyslipidemia and male gender. Treatments tried: lisinopril/hctz.   chronic back pain he uses medication he denies abusing it takes 3 or 4 times per day does not cause drowsiness or dizziness. Patient is interested in trying to to better with his exercise and diet.  Patient needs refill of dilaudid   Patient also sharing cold with his wife-cough and congestion  this is been going on for the past several days now with sinus pressure drainage coughing and sinus pain Review of Systems     Denies high fever chills sweats wheezing or difficulty breathing Objective:   Physical Exam  mild sinus tenderness eardrums normal throat is normal neck supple lungs clear heart regular low back chronic pain and discomfort stable       Assessment & Plan:   post flu illness with secondary sinusitis bronchitis antibiotics prescribed warning signs discussed  Chronic pain - prescriptions given The patient was seen today as part of a comprehensive visit regarding pain control. Patient's compliance with the medication as well as discussion regarding effectiveness was completed. Prescriptions were written. Patient was advised to follow-up in 3 months. The patient was assessed for any signs of severe side effects. The patient was advised to take the medicine as directed and to report to [place] if any side effect issues.

## 2015-06-06 ENCOUNTER — Telehealth: Payer: Self-pay | Admitting: Family Medicine

## 2015-06-06 ENCOUNTER — Other Ambulatory Visit: Payer: Self-pay | Admitting: *Deleted

## 2015-06-06 MED ORDER — CEFPROZIL 500 MG PO TABS: 500.0000 mg | ORAL_TABLET | Freq: Two times a day (BID) | ORAL | Status: DC

## 2015-06-06 NOTE — Telephone Encounter (Signed)
Patient was seen on 2/20 and given anitibiotic augmentin 875 and now has diarreah and abdominal pains,sweats,chills. He thinks it might be a reaction with mixture of pain meds and anitibotic. He wanted to know what you thought could it be that he stopped taking it until he hears from the office.  He uses Personal assistant.

## 2015-06-06 NOTE — Telephone Encounter (Signed)
[facility]. Med already sent to pharm.

## 2015-06-06 NOTE — Telephone Encounter (Signed)
Discontinue Augmentin. More than likely having side effects from Augmentin. Try Cefzil 500 mg 1 twice a day 10 days.

## 2015-06-06 NOTE — Telephone Encounter (Signed)
Discussed with pt

## 2015-07-10 DIAGNOSIS — Z471 Aftercare following joint replacement surgery: Secondary | ICD-10-CM | POA: Diagnosis not present

## 2015-07-10 DIAGNOSIS — Z96651 Presence of right artificial knee joint: Secondary | ICD-10-CM | POA: Diagnosis not present

## 2015-07-19 ENCOUNTER — Observation Stay (HOSPITAL_COMMUNITY): Payer: PPO

## 2015-07-19 ENCOUNTER — Emergency Department (HOSPITAL_COMMUNITY): Payer: PPO

## 2015-07-19 ENCOUNTER — Encounter (HOSPITAL_COMMUNITY): Payer: Self-pay

## 2015-07-19 ENCOUNTER — Observation Stay (HOSPITAL_COMMUNITY)
Admission: EM | Admit: 2015-07-19 | Discharge: 2015-07-20 | Disposition: A | Discharge status: Home / Self Care | Payer: PPO | Attending: Internal Medicine | Admitting: Internal Medicine

## 2015-07-19 ENCOUNTER — Inpatient Hospital Stay (HOSPITAL_COMMUNITY): Payer: PPO

## 2015-07-19 DIAGNOSIS — I480 Paroxysmal atrial fibrillation: Secondary | ICD-10-CM | POA: Diagnosis not present

## 2015-07-19 DIAGNOSIS — F0391 Unspecified dementia with behavioral disturbance: Secondary | ICD-10-CM | POA: Diagnosis not present

## 2015-07-19 DIAGNOSIS — R55 Syncope and collapse: Secondary | ICD-10-CM | POA: Diagnosis not present

## 2015-07-19 DIAGNOSIS — I639 Cerebral infarction, unspecified: Secondary | ICD-10-CM | POA: Diagnosis not present

## 2015-07-19 DIAGNOSIS — G4733 Obstructive sleep apnea (adult) (pediatric): Secondary | ICD-10-CM | POA: Diagnosis not present

## 2015-07-19 DIAGNOSIS — F1120 Opioid dependence, uncomplicated: Secondary | ICD-10-CM | POA: Diagnosis not present

## 2015-07-19 DIAGNOSIS — Z79899 Other long term (current) drug therapy: Secondary | ICD-10-CM | POA: Insufficient documentation

## 2015-07-19 DIAGNOSIS — I10 Essential (primary) hypertension: Secondary | ICD-10-CM | POA: Diagnosis not present

## 2015-07-19 DIAGNOSIS — M25569 Pain in unspecified knee: Secondary | ICD-10-CM | POA: Diagnosis not present

## 2015-07-19 DIAGNOSIS — I4891 Unspecified atrial fibrillation: Secondary | ICD-10-CM | POA: Diagnosis not present

## 2015-07-19 DIAGNOSIS — Z8673 Personal history of transient ischemic attack (TIA), and cerebral infarction without residual deficits: Secondary | ICD-10-CM | POA: Insufficient documentation

## 2015-07-19 DIAGNOSIS — G8929 Other chronic pain: Secondary | ICD-10-CM | POA: Diagnosis present

## 2015-07-19 DIAGNOSIS — R569 Unspecified convulsions: Principal | ICD-10-CM

## 2015-07-19 DIAGNOSIS — R51 Headache: Secondary | ICD-10-CM | POA: Diagnosis not present

## 2015-07-19 DIAGNOSIS — R410 Disorientation, unspecified: Secondary | ICD-10-CM | POA: Diagnosis not present

## 2015-07-19 DIAGNOSIS — E785 Hyperlipidemia, unspecified: Secondary | ICD-10-CM | POA: Insufficient documentation

## 2015-07-19 DIAGNOSIS — F4489 Other dissociative and conversion disorders: Secondary | ICD-10-CM | POA: Diagnosis not present

## 2015-07-19 DIAGNOSIS — R402411 Glasgow coma scale score 13-15, in the field [EMT or ambulance]: Secondary | ICD-10-CM | POA: Diagnosis not present

## 2015-07-19 DIAGNOSIS — E7800 Pure hypercholesterolemia, unspecified: Secondary | ICD-10-CM | POA: Diagnosis not present

## 2015-07-19 DIAGNOSIS — Z79891 Long term (current) use of opiate analgesic: Secondary | ICD-10-CM | POA: Diagnosis not present

## 2015-07-19 DIAGNOSIS — I4819 Other persistent atrial fibrillation: Secondary | ICD-10-CM | POA: Diagnosis present

## 2015-07-19 DIAGNOSIS — Z8249 Family history of ischemic heart disease and other diseases of the circulatory system: Secondary | ICD-10-CM | POA: Insufficient documentation

## 2015-07-19 DIAGNOSIS — Z6841 Body Mass Index (BMI) 40.0 and over, adult: Secondary | ICD-10-CM | POA: Insufficient documentation

## 2015-07-19 DIAGNOSIS — Z825 Family history of asthma and other chronic lower respiratory diseases: Secondary | ICD-10-CM | POA: Insufficient documentation

## 2015-07-19 DIAGNOSIS — Z87891 Personal history of nicotine dependence: Secondary | ICD-10-CM | POA: Diagnosis not present

## 2015-07-19 DIAGNOSIS — I2510 Atherosclerotic heart disease of native coronary artery without angina pectoris: Secondary | ICD-10-CM | POA: Diagnosis not present

## 2015-07-19 LAB — URINALYSIS, ROUTINE W REFLEX MICROSCOPIC
Bilirubin Urine: NEGATIVE
GLUCOSE, UA: NEGATIVE mg/dL
KETONES UR: NEGATIVE mg/dL
LEUKOCYTES UA: NEGATIVE
NITRITE: NEGATIVE
PH: 6 (ref 5.0–8.0)
PROTEIN: NEGATIVE mg/dL
Specific Gravity, Urine: 1.015 (ref 1.005–1.030)

## 2015-07-19 LAB — COMPREHENSIVE METABOLIC PANEL
ALK PHOS: 55 U/L (ref 38–126)
ALT: 17 U/L (ref 17–63)
ANION GAP: 7 (ref 5–15)
AST: 26 U/L (ref 15–41)
Albumin: 3.6 g/dL (ref 3.5–5.0)
BILIRUBIN TOTAL: 0.6 mg/dL (ref 0.3–1.2)
BUN: 22 mg/dL — AB (ref 6–20)
CO2: 26 mmol/L (ref 22–32)
CREATININE: 0.95 mg/dL (ref 0.61–1.24)
Calcium: 8.7 mg/dL — ABNORMAL LOW (ref 8.9–10.3)
Chloride: 105 mmol/L (ref 101–111)
GFR calc Af Amer: >60 mL/min (ref >60)
GFR calc non Af Amer: >60 mL/min (ref >60)
GLUCOSE: 117 mg/dL — AB (ref 65–99)
Potassium: 3.7 mmol/L (ref 3.5–5.1)
Sodium: 138 mmol/L (ref 135–145)
Total Protein: 6.7 g/dL (ref 6.5–8.1)

## 2015-07-19 LAB — CBC WITH DIFFERENTIAL/PLATELET
Basophils Absolute: 0 10*3/uL (ref 0.0–0.1)
Basophils Relative: 0 %
Eosinophils Absolute: 0.1 10*3/uL (ref 0.0–0.7)
Eosinophils Relative: 1 %
HEMATOCRIT: 40.7 % (ref 39.0–52.0)
Hemoglobin: 13.7 g/dL (ref 13.0–17.0)
LYMPHS ABS: 1 10*3/uL (ref 0.7–4.0)
LYMPHS PCT: 12 %
MCH: 31.1 pg (ref 26.0–34.0)
MCHC: 33.7 g/dL (ref 30.0–36.0)
MCV: 92.5 fL (ref 78.0–100.0)
MONOS PCT: 10 %
Monocytes Absolute: 0.8 10*3/uL (ref 0.1–1.0)
NEUTROS ABS: 6 10*3/uL (ref 1.7–7.7)
Neutrophils Relative %: 77 %
Platelets: 187 10*3/uL (ref 150–400)
RBC: 4.4 MIL/uL (ref 4.22–5.81)
RDW: 12.7 % (ref 11.5–15.5)
WBC: 7.8 10*3/uL (ref 4.0–10.5)

## 2015-07-19 LAB — GLUCOSE, CAPILLARY
GLUCOSE-CAPILLARY: 107 mg/dL — AB (ref 65–99)
GLUCOSE-CAPILLARY: 106 mg/dL — AB (ref 65–99)

## 2015-07-19 LAB — URINE MICROSCOPIC-ADD ON: WBC UA: NONE SEEN WBC/hpf (ref 0–5)

## 2015-07-19 LAB — ETHANOL: ALCOHOL ETHYL (B): 5 mg/dL — AB (ref <5)

## 2015-07-19 LAB — TROPONIN I: Troponin I: <0.03 ng/mL (ref <0.031)

## 2015-07-19 MED ORDER — STROKE: EARLY STAGES OF RECOVERY BOOK
Freq: Once | Status: AC
  Administered 2015-07-19: 1
  Filled 2015-07-19: qty 1

## 2015-07-19 MED ORDER — SENNOSIDES-DOCUSATE SODIUM 8.6-50 MG PO TABS
1.0000 | ORAL_TABLET | Freq: Every evening | ORAL | Status: DC | PRN
Start: 2015-07-19 — End: 2015-07-20

## 2015-07-19 MED ORDER — ASPIRIN 300 MG RE SUPP: 300.0000 mg | Freq: Every day | RECTAL | Status: DC

## 2015-07-19 MED ORDER — LISINOPRIL-HYDROCHLOROTHIAZIDE 10-12.5 MG PO TABS: 1.0000 | ORAL_TABLET | Freq: Every day | ORAL | Status: DC

## 2015-07-19 MED ORDER — LEVETIRACETAM 750 MG PO TABS
750.0000 mg | ORAL_TABLET | Freq: Two times a day (BID) | ORAL | Status: DC
  Administered 2015-07-19 – 2015-07-20 (×2): 750 mg via ORAL
  Filled 2015-07-19 (×2): qty 1

## 2015-07-19 MED ORDER — HYDROMORPHONE HCL 2 MG PO TABS
4.0000 mg | ORAL_TABLET | Freq: Four times a day (QID) | ORAL | Status: DC | PRN
  Administered 2015-07-19 – 2015-07-20 (×4): 4 mg via ORAL
  Filled 2015-07-19 (×4): qty 2

## 2015-07-19 MED ORDER — ACETAMINOPHEN 500 MG PO TABS
1000.0000 mg | ORAL_TABLET | Freq: Once | ORAL | Status: AC
  Administered 2015-07-19: 1000 mg via ORAL
  Filled 2015-07-19: qty 2

## 2015-07-19 MED ORDER — HYDROCHLOROTHIAZIDE 12.5 MG PO CAPS
12.5000 mg | ORAL_CAPSULE | Freq: Every day | ORAL | Status: DC
  Administered 2015-07-19 – 2015-07-20 (×2): 12.5 mg via ORAL
  Filled 2015-07-19 (×2): qty 1

## 2015-07-19 MED ORDER — KETOROLAC TROMETHAMINE 15 MG/ML IJ SOLN
15.0000 mg | Freq: Once | INTRAMUSCULAR | Status: AC
  Administered 2015-07-19: 15 mg via INTRAVENOUS
  Filled 2015-07-19: qty 1

## 2015-07-19 MED ORDER — LISINOPRIL 10 MG PO TABS
10.0000 mg | ORAL_TABLET | Freq: Every day | ORAL | Status: DC
  Administered 2015-07-19 – 2015-07-20 (×2): 10 mg via ORAL
  Filled 2015-07-19 (×2): qty 1

## 2015-07-19 MED ORDER — ASPIRIN 325 MG PO TABS
325.0000 mg | ORAL_TABLET | Freq: Every day | ORAL | Status: DC
  Administered 2015-07-19 – 2015-07-20 (×2): 325 mg via ORAL
  Filled 2015-07-19 (×2): qty 1

## 2015-07-19 MED ORDER — PANTOPRAZOLE SODIUM 40 MG PO TBEC
40.0000 mg | DELAYED_RELEASE_TABLET | Freq: Every day | ORAL | Status: DC
  Administered 2015-07-19 – 2015-07-20 (×2): 40 mg via ORAL
  Filled 2015-07-19 (×2): qty 1

## 2015-07-19 MED ORDER — ATORVASTATIN CALCIUM 40 MG PO TABS
40.0000 mg | ORAL_TABLET | Freq: Every day | ORAL | Status: DC
  Administered 2015-07-19: 40 mg via ORAL
  Filled 2015-07-19: qty 1

## 2015-07-19 MED ORDER — NALOXONE HCL 0.4 MG/ML IJ SOLN: 0.4000 mg | INTRAMUSCULAR | Status: DC | PRN

## 2015-07-19 MED ORDER — LORAZEPAM 2 MG/ML IJ SOLN
1.0000 mg | Freq: Once | INTRAMUSCULAR | Status: AC
  Administered 2015-07-19: 1 mg via INTRAVENOUS
  Filled 2015-07-19: qty 1

## 2015-07-19 NOTE — Procedures (Signed)
History: Steven [name] is an 75 y.o. male patient with altered mental status. Routine inpatient EEG was performed for further evaluation.   Patient Active Problem List   Diagnosis Date Noted  . New onset seizure ([place]) 07/19/2015  . Seizure ([place]) 07/19/2015  . Acute ischemic stroke ([place])   . Obstructive sleep apnea 10/07/2014  . Morbid obesity ([place]) 05/23/2014  . Prediabetes 01/30/2014  . Chronic back pain 11/28/2013  . Chronic, continuous use of opioids 11/28/2013  . Postop Acute blood loss anemia 03/15/2012  . OA (osteoarthritis) of knee 03/01/2012  . Hypokalemia 03/19/2011  . Hx of post-op Afib 03/17/2011  . HTN (hypertension) 03/12/2011  . Hypercholesteremia 03/12/2011  . Chronic knee pain 03/12/2011  . Postop Hyponatremia 03/12/2011  . Acute cholecystitis 03/12/2011  . Anemia 03/12/2011  . CORONARY ATHEROSCLEROSIS NATIVE CORONARY ARTERY 03/20/2010  . OTHER DYSPNEA AND RESPIRATORY ABNORMALITIES 02/08/2010  . ABNORMAL ELECTROCARDIOGRAM 02/08/2010     Current facility-administered medications:  .  aspirin suppository 300 mg, 300 mg, Rectal, Daily **OR** aspirin tablet 325 mg, 325 mg, Oral, Daily, Edwin Dada, MD, 325 mg at 07/19/15 1018 .  atorvastatin (LIPITOR) tablet 40 mg, 40 mg, Oral, q1800, Edwin Dada, MD .  lisinopril (PRINIVIL,ZESTRIL) tablet 10 mg, 10 mg, Oral, Daily, 10 mg at 07/19/15 1018 **AND** hydrochlorothiazide (MICROZIDE) capsule 12.5 mg, 12.5 mg, Oral, Daily, Edwin Dada, MD, 12.5 mg at 07/19/15 1018 .  HYDROmorphone (DILAUDID) tablet 4 mg, 4 mg, Oral, Q6H PRN, Edwin Dada, MD, 4 mg at 07/19/15 1457 .  naloxone [facility]) injection 0.4 mg, 0.4 mg, Intravenous, Q10 min PRN, Janece Canterbury, MD .  pantoprazole (PROTONIX) EC tablet 40 mg, 40 mg, Oral, Daily, Edwin Dada, MD, 40 mg at 07/19/15 1018 .  senna-docusate (Senokot-S) tablet 1 tablet, 1 tablet, Oral, QHS PRN, Edwin Dada, MD   Introduction:  This  is a 19 channel routine scalp EEG performed at the bedside with bipolar and monopolar montages arranged in accordance to the international 10/20 system of electrode placement. One channel was dedicated to EKG recording.   Findings:  The background rhythm was normal 9-10 Hz alpha . A few infrequent intermittent right temporal abnormal epileptiform discharges in the form of sharps with phase reversals were noted . No evidence of electrographic seizures were noted during this recording.   Impression:  Abnormal awake and drowsy routine inpatient EEG suggestive of possible epileptogenic potential in the right temporal region. Clinical correlation is recommended .

## 2015-07-19 NOTE — Progress Notes (Signed)
Patient states that he has a CPAP at home and has wore it on and off. Patient did not wish to wear CPAP tonight. RT made patient aware that if he changed his mind to call. Then patient states that he will be going home tomorrow.

## 2015-07-19 NOTE — ED Notes (Signed)
Pt brought in by ems for combativeness at home per pt's wife.  EMS arrived to find pt uncooperative and not able to follow instructions.  Pt was given versed initially without improvement.  Pt was then given haldol per edp order en route with improvement of condition.  Pt arrives awake, alert, oriented x 3, cooperative, unable to answer all questions appropriattely.

## 2015-07-19 NOTE — Consult Note (Signed)
Reason for Consult: New onset seizure Referring Physician: Dr Sheran Fava  CC: Seizure activity  HPI: Steven [name] is a 75 y.o. male with a history of chronic pain, hyperlipidemia, hypertension, and obstructive sleep apnea, who was admitted to [facility] today for further evaluation of new onset seizure. The history was obtained from both the patient and his wife who was in the room. His wife reports that on several occasions over the past couple of months the patient stated that he did not feel right. He felt as if he were dying. He could not point to any specific symptoms.  The patient has a history of obstructive sleep apnea. He wears a C Pap device at night. He and his wife sleep in separate bedrooms. Last night she was awoken from sleep by some noise. She went down the hall to check on her husband and found him without his mask on and having seizure activity with jerking of the upper body. She tried to talk to him but he did not respond. This went on for at least a minute or two. She is not sure how long it may have been going on before she arrived. His wife called EMS and he was brought to [facility]. The seizure activity had stopped prior to EMS arriving at their home. The patient states that he often takes his C Pap device off in his sleep during the night. He also reports having a headache last night. He has no previous history of seizures. The only other symptoms that the patient can recall are a few episodes of mild dizziness recently.  An MRI / MRA was performed without contrast that was unremarkable. An EEG was performed today that was abnormal. (See below)    Past Medical History  Diagnosis Date  . Obesity   . Dyslipidemia   . Coronary artery disease 02-27-11    Dr. Jacquiline Doe follows-has some blocked coronary arteries ,not suitable for stent  placement  . Hypertension   . Shingles outbreak 02-27-11    2 weeks ago , was tx.-only residual is tenderness of right  scalp-no open areas  . GERD (gastroesophageal reflux disease) 02-27-11    Acid reflux  . Hemorrhoids 02-27-11    not bothersome at this time  . Hearing loss 02-27-11    Bilateral hearing aids-due to exposure to loud machinery  . Urinary incontinence 02-27-11    not an everday occurrence-no special measures  . Osteoarthritis 02-27-11    Ostearthritis-knees, shoulders.Back causes chronic pain-radiates down right leg  . Chronic pain   . Hyperlipidemia   . Leg swelling   . Rash   . Complication of anesthesia     pt woke up during last surgery   . Dysrhythmia     hx of atrial fib during last hospitalization   . Shortness of breath     with exertion     Past Surgical History  Procedure Laterality Date  . Replacement total knee  02-27-11    right- 2007  . Total knee revision  03/05/2011    Procedure: TOTAL KNEE REVISION;  Surgeon: Dione Plover Aluisio;  Location: [facility];  Service: Orthopedics;  Laterality: Right;  . Injection knee  03/05/2011    Procedure: KNEE INJECTION;  Surgeon: Gearlean Alf;  Location: [facility];  Service: Orthopedics;  Laterality: Left;  80 mg depomedrol  . Cholecystectomy  03/14/2011    Procedure: LAPAROSCOPIC CHOLECYSTECTOMY;  Surgeon: Edward Jolly, MD;  Location: [facility];  Service:  General;  Laterality: N/A;  . Laparoscopy  03/16/2011    Procedure: LAPAROSCOPY DIAGNOSTIC;  Surgeon: Judieth Keens, DO;  Location: [facility];  Service: General;  Laterality: N/A;  repair incisional hernia  . Total knee arthroplasty  03/01/2012    Procedure: TOTAL KNEE ARTHROPLASTY;  Surgeon: Gearlean Alf, MD;  Location: [facility];  Service: Orthopedics;  Laterality: Left;  . Colonoscopy      Family History  Problem Relation Age of Onset  . Heart attack Father     mi I N HIS 40'S BUT LIVED INTO HIS 90'S WITH COPD  . COPD Father   . Cancer Father     colon  . Other Mother 7    died multiple med problems  . Heart disease Mother   . Hypertension Mother   . Heart attack  Sister 80    Social History:  reports that he has quit smoking. He started smoking about 53 years ago. He quit smokeless tobacco use about 42 years ago. His smokeless tobacco use included Chew. He reports that he does not drink alcohol or use illicit drugs.  Allergies  Allergen Reactions  . Ciprofloxacin Rash  . Oxycodone-Acetaminophen Rash and Other (See Comments)    Felt like he was hot    Medications:  Scheduled: . aspirin  300 mg Rectal Daily   Or  . aspirin  325 mg Oral Daily  . atorvastatin  40 mg Oral q1800  . lisinopril  10 mg Oral Daily   And  . hydrochlorothiazide  12.5 mg Oral Daily  . pantoprazole  40 mg Oral Daily    ROS: History obtained from the patient and wife   The review of systems was negative except for as noted in the history of present illness above.  General ROS: negative for - chills, fatigue, fever, night sweats, weight gain or weight loss Psychological ROS: negative for - behavioral disorder, hallucinations, memory difficulties, mood swings or suicidal ideation Ophthalmic ROS: negative for - blurry vision, double vision, eye pain or loss of vision ENT ROS: negative for - epistaxis, nasal discharge, oral lesions, sore throat, tinnitus or vertigo Allergy and Immunology ROS: negative for - hives or itchy/watery eyes Hematological and Lymphatic ROS: negative for - bleeding problems, bruising or swollen lymph nodes Endocrine ROS: negative for - galactorrhea, hair pattern changes, polydipsia/polyuria or temperature intolerance Respiratory ROS: negative for - cough, hemoptysis, shortness of breath or wheezing Cardiovascular ROS: negative for - chest pain, dyspnea on exertion, edema or irregular heartbeat Gastrointestinal ROS: negative for - abdominal pain, diarrhea, hematemesis, nausea/vomiting or stool incontinence Genito-Urinary ROS: negative for - dysuria, hematuria, incontinence or urinary frequency/urgency Musculoskeletal ROS: negative for - joint  swelling or muscular weakness Neurological ROS: as noted in HPI Dermatological ROS: negative for rash and skin lesion changes   Physical Examination: Blood pressure 143/80, pulse 71, temperature 97.7 F (36.5 C), temperature source Oral, resp. rate 20, height 5' 10.5" (1.791 m), weight 136.079 kg (300 lb), SpO2 96 %.   General -  pleasant 75 year old male in no acute distress. Heart - Regular rate and rhythm - distant heart sounds. No murmur appreciated. Lungs - Clear to auscultation Abdomen - Soft - non tender Extremities - Distal pulses intact - no edema Skin - Warm and dry   Neurologic Examination Mental Status: Alert, oriented, thought content appropriate.  Speech fluent without evidence of aphasia.  Able to follow 3 step commands without difficulty. Cranial Nerves: II: Discs flat bilaterally; Visual fields grossly normal,  pupils equal, round, reactive to light and accommodation III,IV, VI: ptosis not present, extra-ocular motions intact bilaterally V,VII: smile symmetric, facial light touch sensation normal bilaterally VIII: hearing normal bilaterally IX,X: gag reflex present XI: bilateral shoulder shrug XII: midline tongue extension Motor: Right : Upper extremity   5/5    Left:     Upper extremity   5/5  Lower extremity   5/5     Lower extremity   5/5 Tone and bulk:normal tone throughout; no atrophy noted Sensory: Pinprick and light touch intact throughout, bilaterally Deep Tendon Reflexes: 2+ and symmetric throughout Plantars: Right: Upgoing   Left: downgoing Cerebellar: normal finger-to-nose, normal rapid alternating movements and normal heel-to-shin test Gait: normal gait and station CV: pulses palpable throughout   Laboratory Studies:   Basic Metabolic Panel:  Recent Labs Lab 07/19/15 0337  NA 138  K 3.7  CL 105  CO2 26  GLUCOSE 117*  BUN 22*  CREATININE 0.95  CALCIUM 8.7*    Liver Function Tests:  Recent Labs Lab 07/19/15 0337  AST 26  ALT 17   ALKPHOS 55  BILITOT 0.6  PROT 6.7  ALBUMIN 3.6   No results for input(s): LIPASE, AMYLASE in the last 168 hours. No results for input(s): AMMONIA in the last 168 hours.  CBC:  Recent Labs Lab 07/19/15 0337  WBC 7.8  NEUTROABS 6.0  HGB 13.7  HCT 40.7  MCV 92.5  PLT 187    Cardiac Enzymes:  Recent Labs Lab 07/19/15 0337  TROPONINI <0.03    BNP: Invalid input(s): POCBNP  CBG: No results for input(s): GLUCAP in the last 168 hours.  Microbiology: Results for orders placed or performed during the hospital encounter of 02/24/12  Surgical pcr screen     Status: None   Collection Time: 02/24/12  8:55 AM  Result Value Ref Range Status   MRSA, PCR NEGATIVE NEGATIVE Final   Staphylococcus aureus NEGATIVE NEGATIVE Final    Comment:        The Xpert SA Assay (FDA approved for NASAL specimens in patients over 26 years of age), is one component of a comprehensive surveillance program.  Test performance has been validated by [employer] for patients greater than or equal to 24 year old. It is not intended to diagnose infection nor to guide or monitor treatment.    Coagulation Studies: No results for input(s): LABPROT, INR in the last 72 hours.  Urinalysis:  Recent Labs Lab 07/19/15 0348  COLORURINE YELLOW  LABSPEC 1.015  PHURINE 6.0  GLUCOSEU NEGATIVE  HGBUR TRACE*  BILIRUBINUR NEGATIVE  KETONESUR NEGATIVE  PROTEINUR NEGATIVE  NITRITE NEGATIVE  LEUKOCYTESUR NEGATIVE    Lipid Panel:     Component Value Date/Time   CHOL 130 02/01/2015 0810   CHOL 122 01/23/2014 0708   TRIG 118 02/01/2015 0810   HDL 39* 02/01/2015 0810   HDL 37* 01/23/2014 0708   CHOLHDL 3.3 02/01/2015 0810   CHOLHDL 3.3 01/23/2014 0708   VLDL 17 01/23/2014 0708   LDLCALC 67 02/01/2015 0810   LDLCALC 68 01/23/2014 0708    HgbA1C:  Lab Results  Component Value Date   HGBA1C 5.9* 02/01/2015    Urine Drug Screen:  No results found for: LABOPIA, COCAINSCRNUR, LABBENZ,  AMPHETMU, THCU, LABBARB  Alcohol Level:  Recent Labs Lab 07/19/15 0336  ETH 5*    Other results: EKG: Sinus rhythm rate 86 bpm. Prolonged PR interval.  See formal cardiology reading for complete details.   Imaging:   Ct Head  Wo Contrast 07/19/2015   1. Small focal parenchymal hypodensity within the left frontotemporal region, indeterminate, but may reflect a small evolving acute left MCA territory ischemic infarct. This could be further assessed with dedicated MRI as clinically desired.  2. No other acute intracranial process.      Mr Jodene Nam Head/brain Wo Cm 07/19/2015   1. No acute intracranial abnormality.  2. Intermittently motion degraded study with stable appearing noncontrast MRI appearance of the brain since 2012.  3. Negative intracranial MRA.    EEG 07/19/2015 Impression:  Abnormal awake and drowsy routine inpatient EEG suggestive of  possible epileptogenic potential in the right temporal region.  Clinical correlation is recommended .     Assessment/Plan:   Definitive assessment and plan to follow per Dr. Silverio Decamp or partner.  Per Dr. Silverio Decamp - start Keppra 750 mg every 12 hours.  Follow-up with neurology in clinic in 2-4 weeks.   Mikey Bussing PA-C [facility] Pager [phone] 07/19/2015, 5:32 PM

## 2015-07-19 NOTE — Progress Notes (Signed)
EEG Completed; Results Pending  

## 2015-07-19 NOTE — H&P (Signed)
History and Physical  Patient Name: Steven [name]     [id number]    DOB: 29-Mar-1941    DOA: 07/19/2015 Referring physician: Rolland Porter, MD PCP: Sallee Lange, MD      Chief Complaint: Seizure  HPI: Steven [name] is a 75 y.o. male with a past medical history significant for HTN and chronic pain who presents with seizure.  She was in his usual state of health until about a month ago, when his wife noticed that he started to say "I'm having the spells gradually come dying". The patient describes this is having short episodes where he "just didn't feel right".  Then last night in the middle of the night, the patient's wife (who sleeps in a different room) heard him "yelling", ran into the room and found him having clonic jerking movements of both arms, and unable to respond. Immediately after he was "snoring", and after that he settled down but was agitated, didn't seem to want to cooperate with his wife, and confused. This persisted and EMS arrived and he was battling with EMS.  In the ED, the patient was afebrile, hemodynamically stable, saturating well on room air. He had no further seizure activity.  The ECG showed normal sinus rhythm with no ischemic changes. CT of the head showed an indeterminate "patchy hypodensity involving the gray matter of the left frontotemporal region", the case was discussed with neurology who recommended transfer to [facility], and [facility] were asked to admit.     Review of Systems:  Pt complains of back pain, chronic knee pain, seizure. Pt denies any speech disturbance, focal weakness, numbness, confusion.  All other systems negative except as just noted or noted in the history of present illness.  Allergies  Allergen Reactions  . Percocet [Oxycodone-Acetaminophen]     rash  . Ciprofloxacin Rash  . Oxycodone-Acetaminophen Rash and Other (See Comments)    Felt like he was hot    Prior to Admission medications   Medication Sig Start Date End Date Taking?  Authorizing Provider  atorvastatin (LIPITOR) 40 MG tablet TAKE ONE TABLET BY MOUTH ONCE DAILY. 04/24/15  Yes Kathyrn Drown, MD  cholecalciferol (VITAMIN D) 1000 UNITS tablet Take 1,000 Units by mouth daily.   Yes Historical Provider, MD  Cyanocobalamin (VITAMIN B-12 PO) Take 1,000 mg by mouth daily.   Yes Historical Provider, MD  HYDROmorphone (DILAUDID) 4 MG tablet Take 1 tablet (4 mg total) by mouth every 6 (six) hours as needed. Pain 06/04/15  Yes Kathyrn Drown, MD  lisinopril-hydrochlorothiazide (PRINZIDE,ZESTORETIC) 10-12.5 MG per tablet TAKE (1) TABLET BY MOUTH EACH MORNING. 11/14/14  Yes Kathyrn Drown, MD  Methylcellulose, Laxative, (CITRUCEL) 500 MG TABS Take 1 tablet by mouth daily.    Yes Historical Provider, MD  Omega-3 Fatty Acids (FISH OIL) 1000 MG CAPS Take 1,000 mg by mouth daily.   Yes Historical Provider, MD  omeprazole (PRILOSEC) 20 MG capsule TAKE ONE CAPSULE BY MOUTH ONCE DAILY. 11/14/14  Yes Kathyrn Drown, MD    Past Medical History  Diagnosis Date  . Obesity   . Dyslipidemia   . Coronary artery disease 02-27-11    Dr. Jacquiline Doe follows-has some blocked coronary arteries ,not suitable for stent  placement  . Hypertension   . Shingles outbreak 02-27-11    2 weeks ago , was tx.-only residual is tenderness of right scalp-no open areas  . GERD (gastroesophageal reflux disease) 02-27-11    Acid reflux  . Hemorrhoids 02-27-11    not  bothersome at this time  . Hearing loss 02-27-11    Bilateral hearing aids-due to exposure to loud machinery  . Urinary incontinence 02-27-11    not an everday occurrence-no special measures  . Osteoarthritis 02-27-11    Ostearthritis-knees, shoulders.Back causes chronic pain-radiates down right leg  . Chronic pain   . Hyperlipidemia   . Leg swelling   . Rash   . Complication of anesthesia     pt woke up during last surgery   . Dysrhythmia     hx of atrial fib during last hospitalization   . Shortness of breath     with  exertion     Past Surgical History  Procedure Laterality Date  . Replacement total knee  02-27-11    right- 2007  . Total knee revision  03/05/2011    Procedure: TOTAL KNEE REVISION;  Surgeon: Dione Plover Aluisio;  Location: [facility];  Service: Orthopedics;  Laterality: Right;  . Injection knee  03/05/2011    Procedure: KNEE INJECTION;  Surgeon: Gearlean Alf;  Location: [facility];  Service: Orthopedics;  Laterality: Left;  80 mg depomedrol  . Cholecystectomy  03/14/2011    Procedure: LAPAROSCOPIC CHOLECYSTECTOMY;  Surgeon: Edward Jolly, MD;  Location: [facility];  Service: General;  Laterality: N/A;  . Laparoscopy  03/16/2011    Procedure: LAPAROSCOPY DIAGNOSTIC;  Surgeon: Judieth Keens, DO;  Location: [facility];  Service: General;  Laterality: N/A;  repair incisional hernia  . Total knee arthroplasty  03/01/2012    Procedure: TOTAL KNEE ARTHROPLASTY;  Surgeon: Gearlean Alf, MD;  Location: [facility];  Service: Orthopedics;  Laterality: Left;  . Colonoscopy      Family history: family history includes COPD in his father; Cancer in his father; Heart attack in his father; Heart attack (age of onset: 45) in his sister; Heart disease in his mother; Hypertension in his mother; Other (age of onset: 33) in his mother.  Social History: Patient lives With his wife. He is a former tobacco farmer. He is a very remote smoking history. He is independent with all IADLs and ADLs, still drives, and still works part-time.       Physical Exam: BP 129/97 mmHg  Pulse 81  Temp(Src) 98 F (36.7 C) (Oral)  Resp 19  Ht 5' 10.5" (1.791 m)  Wt 136.079 kg (300 lb)  BMI 42.42 kg/m2  SpO2 98% General appearance: Well-developed, adult male, alert and in no acute distress.   Eyes: Anicteric, conjunctiva pink, lids and lashes normal.     ENT: No nasal deformity, discharge, or epistaxis.  OP moist without lesions.   Lymph: No cervical, supraclavicular or axillary lymphadenopathy. Skin: Warm and dry.  No  jaundice.  No suspicious rashes or lesions. Cardiac: RRR, nl S1-S2, no murmurs appreciated.  Capillary refill is brisk.  JVP not visible.  No LE edema.  Radial and DP pulses 2+ and symmetric. Respiratory: Normal respiratory rate and rhythm.  CTAB without rales or wheezes. Abdomen: Abdomen soft without rigidity.  No TTP. No ascites, distension.   MSK: No deformities or effusions. Neuro: Pupils are 4 mm and reactive to 3 mm. Extraocular movements are intact, without nystagmus, but with saccades?. Cranial nerve 5 is within normal limits. Cranial nerve 7 is symmetrical. Cranial nerve 8 is within normal limits. Cranial nerves 9 and 10 reveal equal palate elevation. Cranial nerve 11 reveals sternocleidomastoid strong. Cranial nerve 12 is midline. I do not note a deficit in motor strength testing in the upper  and lower extremities bilaterally with normal motor, tone and bulk. Romberg maneuver is negative for pathology. Finger-to-nose testing is within normal limits. The patient is oriented to time, place and person. Speech is fluent. Naming is grossly intact. Recall, recent and remote, as well as general fund of knowledge seem within normal limits. Attention span and concentration are within normal limits.   Psych: Behavior appropriate.  Affect normal.  No evidence of aural or visual hallucinations or delusions.       Labs on Admission:  The metabolic panel shows normal lites and renal function. Normal transaminases and bilirubin. Alcohol negative. Troponin negative. The complete blood count shows no leukocytosis, thrombocytopenia, anemia. UA normal.   Radiological Exams on Admission: CT head without contrast 07/19/2015    IMPRESSION: 1. Small focal parenchymal hypodensity within the left frontotemporal region, indeterminate, but may reflect a small evolving acute left MCA territory ischemic infarct. This could be further assessed with dedicated MRI as clinically desired. 2. No other acute  intracranial process.   EKG: Independently reviewed. Rate 80s, no ST changes, QTc normal.    Assessment/Plan 1. Suspected acute Stroke/TIA:  This is new.   -Admit to telemetry -Neuro checks, NIHSS per protocol -Daily aspirin 325 mg from baby aspirin -Lipids, hemoglobin A1c -MRI brain and MRA ordered -Echocardiogram ordered -PT/OT/SLP consultation -Consult to Neurology, appreciate recommendations   2. Seizure:  This is new.   -Seizure precautions. -Consult to Neurology, appreciate cares  3. HTN:  -Continue home lisinopril-HCTZ and statin  4. Chronic pain:  Stable.  -Continue home hydromorphone   DVT PPx: SCDs Diet: Heart healthy after swallow screen Consultants: Neurology Code Status: Full Family Communication: The diagnosis and expected plan of care were dsicussed with the family at the bedside.  All questions were answered.  Code status was discussed.  Medical decision making: Patient seen 6:37 AM on 07/19/2015.  What exists of the patient's electronic chart were reviewed and the case was discussed with Dr. Tomi Bamberger.   Disposition Plan:  Will admit for telemetry for suspected stroke.  Stroke work up as above and consult to ancillary services.  Expect discharge within 3-4 days.    Edwin Dada [facility] Pager [phone]

## 2015-07-19 NOTE — ED Provider Notes (Signed)
CSN: [id number]     Arrival date & time 07/19/15  0259 History   First MD Initiated Contact with Patient 07/19/15 [phone]     Chief Complaint  Patient presents with  . Altered Mental Status     (Consider location/radiation/quality/duration/timing/severity/associated sxs/prior Treatment) HPI patient presents via EMS. They had called me from the patient's home due to patient being extremely combative. They had given him Versed 5 mg without relief. He was given Haldol 5 mg IM and they report 5 minutes later he was cooperative. Patient states he doesn't remember what happened tonight, he states he woke up and people were wrestling him. Wife reports that the past month he has made comments off and on that he felt like he was dying however he could not tell her what that meant. He  also said earlier this evening that "something is the matter with me". However he could not tell her what it was. She states they had gone to bed and she heard a noise in his bedroom. When she went in the room he was jerking all over in his bed and he was not responsive. She states that did not last very long and then he was snoring loudly. When he started waking up he became extremely combative. They state he has rosacea however his face is more flushed than usual. She also reports he supposed to use CPAP at night but he did not have it on tonight. EMS reports his CBG was 161. Patient is now complaining of a headache and his usual chronic back pain that he is been on narcotics for for years.   PCP Dr Wolfgang Phoenix  Past Medical History  Diagnosis Date  . Obesity   . Dyslipidemia   . Coronary artery disease 02-27-11    Dr. Jacquiline Doe follows-has some blocked coronary arteries ,not suitable for stent  placement  . Hypertension   . Shingles outbreak 02-27-11    2 weeks ago , was tx.-only residual is tenderness of right scalp-no open areas  . GERD (gastroesophageal reflux disease) 02-27-11    Acid reflux  . Hemorrhoids  02-27-11    not bothersome at this time  . Hearing loss 02-27-11    Bilateral hearing aids-due to exposure to loud machinery  . Urinary incontinence 02-27-11    not an everday occurrence-no special measures  . Osteoarthritis 02-27-11    Ostearthritis-knees, shoulders.Back causes chronic pain-radiates down right leg  . Chronic pain   . Hyperlipidemia   . Leg swelling   . Rash   . Complication of anesthesia     pt woke up during last surgery   . Dysrhythmia     hx of atrial fib during last hospitalization   . Shortness of breath     with exertion    Past Surgical History  Procedure Laterality Date  . Replacement total knee  02-27-11    right- 2007  . Total knee revision  03/05/2011    Procedure: TOTAL KNEE REVISION;  Surgeon: Dione Plover Aluisio;  Location: [facility];  Service: Orthopedics;  Laterality: Right;  . Injection knee  03/05/2011    Procedure: KNEE INJECTION;  Surgeon: Gearlean Alf;  Location: [facility];  Service: Orthopedics;  Laterality: Left;  80 mg depomedrol  . Cholecystectomy  03/14/2011    Procedure: LAPAROSCOPIC CHOLECYSTECTOMY;  Surgeon: Edward Jolly, MD;  Location: [facility];  Service: General;  Laterality: N/A;  . Laparoscopy  03/16/2011    Procedure: LAPAROSCOPY DIAGNOSTIC;  Surgeon: Judieth Keens,  DO;  Location: [facility];  Service: General;  Laterality: N/A;  repair incisional hernia  . Total knee arthroplasty  03/01/2012    Procedure: TOTAL KNEE ARTHROPLASTY;  Surgeon: Gearlean Alf, MD;  Location: [facility];  Service: Orthopedics;  Laterality: Left;  . Colonoscopy     Family History  Problem Relation Age of Onset  . Heart attack Father     mi I N HIS 40'S BUT LIVED INTO HIS 90'S WITH COPD  . COPD Father   . Cancer Father     colon  . Other Mother 37    died multiple med problems  . Heart disease Mother   . Hypertension Mother   . Heart attack Sister 67   Social History  Substance Use Topics  . Smoking status: Former Smoker    Start date:  12/24/1961  . Smokeless tobacco: Former Systems developer    Types: Chew    Quit date: 01/27/1973  . Alcohol Use: No  lives at home Lives with spouse  Review of Systems  All other systems reviewed and are negative.     Allergies  Percocet; Ciprofloxacin; and Oxycodone-acetaminophen  Home Medications   Prior to Admission medications   Medication Sig Start Date End Date Taking? Authorizing Provider  atorvastatin (LIPITOR) 40 MG tablet TAKE ONE TABLET BY MOUTH ONCE DAILY. 04/24/15  Yes Kathyrn Drown, MD  cholecalciferol (VITAMIN D) 1000 UNITS tablet Take 1,000 Units by mouth daily.   Yes Historical Provider, MD  Cyanocobalamin (VITAMIN B-12 PO) Take 1,000 mg by mouth daily.   Yes Historical Provider, MD  HYDROmorphone (DILAUDID) 4 MG tablet Take 1 tablet (4 mg total) by mouth every 6 (six) hours as needed. Pain 06/04/15  Yes Kathyrn Drown, MD  lisinopril-hydrochlorothiazide (PRINZIDE,ZESTORETIC) 10-12.5 MG per tablet TAKE (1) TABLET BY MOUTH EACH MORNING. 11/14/14  Yes Kathyrn Drown, MD  Methylcellulose, Laxative, (CITRUCEL) 500 MG TABS Take 1 tablet by mouth daily.    Yes Historical Provider, MD  Omega-3 Fatty Acids (FISH OIL) 1000 MG CAPS Take 1,000 mg by mouth daily.   Yes Historical Provider, MD  omeprazole (PRILOSEC) 20 MG capsule TAKE ONE CAPSULE BY MOUTH ONCE DAILY. 11/14/14  Yes Kathyrn Drown, MD   BP 120/73 mmHg  Pulse 93  Temp(Src) 98 F (36.7 C) (Oral)  Resp 20  Ht 5' 10.5" (1.791 m)  Wt 300 lb (136.079 kg)  BMI 42.42 kg/m2  SpO2 96%  Vital signs normal   Physical Exam  Constitutional: He is oriented to person, place, and time. He appears well-developed and well-nourished.  Non-toxic appearance. He does not appear ill. No distress.  HENT:  Head: Normocephalic and atraumatic.  Right Ear: External ear normal.  Left Ear: External ear normal.  Nose: Nose normal. No mucosal edema or rhinorrhea.  Mouth/Throat: Oropharynx is clear and moist and mucous membranes are normal. No  dental abscesses or uvula swelling.  Eyes: Conjunctivae and EOM are normal. Pupils are equal, round, and reactive to light.  Neck: Normal range of motion and full passive range of motion without pain. Neck supple.  Cardiovascular: Normal rate, regular rhythm and normal heart sounds.  Exam reveals no gallop and no friction rub.   No murmur heard. Pulmonary/Chest: Effort normal and breath sounds normal. No respiratory distress. He has no wheezes. He has no rhonchi. He has no rales. He exhibits no tenderness and no crepitus.  Abdominal: Soft. Normal appearance and bowel sounds are normal. He exhibits no distension. There is no  tenderness. There is no rebound and no guarding.  Musculoskeletal: Normal range of motion. He exhibits no edema or tenderness.  Moves all extremities well.   Neurological: He is alert and oriented to person, place, and time. He has normal strength. No cranial nerve deficit.  Patient has no focal neuro deficit, patient knows the month and year, however he cannot remember who just ran for president and who the president is currently.  Skin: Skin is warm, dry and intact. No rash noted. No erythema. No pallor.  Face is flushed  Psychiatric: He has a normal mood and affect. His speech is normal and behavior is normal. His mood appears not anxious.  Nursing note and vitals reviewed.   ED Course  Procedures (including critical care time)  Medications  atorvastatin (LIPITOR) tablet 40 mg (not administered)  lisinopril-hydrochlorothiazide (PRINZIDE,ZESTORETIC) 10-12.5 MG per tablet 1 tablet (not administered)  pantoprazole (PROTONIX) EC tablet 40 mg (not administered)  HYDROmorphone (DILAUDID) tablet 4 mg (4 mg Oral Given 07/19/15 0734)  acetaminophen (TYLENOL) tablet 1,000 mg (1,000 mg Oral Given 07/19/15 0359)   Patient was given Tylenol for his headache. CT scan of his head was ordered. Basic laboratory testing was done. We discussed he might need an MRI later this morning.  5  PM patient and his family were given the results of his testing. We discussed that he needs an MRI and his CT is very suspicious for an acute stroke. I am going to speak to the neurologist at [facility] to see if he should go there for admission or if he could stay here for evaluation.  05:22 Dr Cristobal Goldmann, Neurology, states patient should be admitted at [facility], have hospitalists admit.   05:39 Dr Loleta Books, admit to [facility], tele, Dr Alcario Drought as accepting doctor.   Labs Review Results for orders placed or performed during the hospital encounter of 07/19/15  Comprehensive metabolic panel  Result Value Ref Range   Sodium 138 135 - 145 mmol/L   Potassium 3.7 3.5 - 5.1 mmol/L   Chloride 105 101 - 111 mmol/L   CO2 26 22 - 32 mmol/L   Glucose, Bld 117 (H) 65 - 99 mg/dL   BUN 22 (H) 6 - 20 mg/dL   Creatinine, Ser 0.95 0.61 - 1.24 mg/dL   Calcium 8.7 (L) 8.9 - 10.3 mg/dL   Total Protein 6.7 6.5 - 8.1 g/dL   Albumin 3.6 3.5 - 5.0 g/dL   AST 26 15 - 41 U/L   ALT 17 17 - 63 U/L   Alkaline Phosphatase 55 38 - 126 U/L   Total Bilirubin 0.6 0.3 - 1.2 mg/dL   GFR calc non Af Amer >60 >60 mL/min   GFR calc Af Amer >60 >60 mL/min   Anion gap 7 5 - 15  CBC with Differential  Result Value Ref Range   WBC 7.8 4.0 - 10.5 K/uL   RBC 4.40 4.22 - 5.81 MIL/uL   Hemoglobin 13.7 13.0 - 17.0 g/dL   HCT 40.7 39.0 - 52.0 %   MCV 92.5 78.0 - 100.0 fL   MCH 31.1 26.0 - 34.0 pg   MCHC 33.7 30.0 - 36.0 g/dL   RDW 12.7 11.5 - 15.5 %   Platelets 187 150 - 400 K/uL   Neutrophils Relative % 77 %   Neutro Abs 6.0 1.7 - 7.7 K/uL   Lymphocytes Relative 12 %   Lymphs Abs 1.0 0.7 - 4.0 K/uL   Monocytes Relative 10 %   Monocytes Absolute 0.8  0.1 - 1.0 K/uL   Eosinophils Relative 1 %   Eosinophils Absolute 0.1 0.0 - 0.7 K/uL   Basophils Relative 0 %   Basophils Absolute 0.0 0.0 - 0.1 K/uL  Ethanol  Result Value Ref Range   Alcohol, Ethyl (B) 5 (H) <5 mg/dL  Troponin I  Result Value Ref Range   Troponin I <0.03 <0.031  ng/mL  Urinalysis, Routine w reflex microscopic  Result Value Ref Range   Color, Urine YELLOW YELLOW   APPearance CLEAR CLEAR   Specific Gravity, Urine 1.015 1.005 - 1.030   pH 6.0 5.0 - 8.0   Glucose, UA NEGATIVE NEGATIVE mg/dL   Hgb urine dipstick TRACE (A) NEGATIVE   Bilirubin Urine NEGATIVE NEGATIVE   Ketones, ur NEGATIVE NEGATIVE mg/dL   Protein, ur NEGATIVE NEGATIVE mg/dL   Nitrite NEGATIVE NEGATIVE   Leukocytes, UA NEGATIVE NEGATIVE  Urine microscopic-add on  Result Value Ref Range   Squamous Epithelial / LPF 0-5 (A) NONE SEEN   WBC, UA NONE SEEN 0 - 5 WBC/hpf   RBC / HPF 0-5 0 - 5 RBC/hpf   Bacteria, UA RARE (A) NONE SEEN   Laboratory interpretation all normal   Imaging Review Ct Head Wo Contrast  07/19/2015  CLINICAL DATA:  Initial evaluation for acute seizure, headache. EXAM: CT HEAD WITHOUT CONTRAST TECHNIQUE: Contiguous axial images were obtained from the base of the skull through the vertex without intravenous contrast. COMPARISON:  Prior study from 08/30/2010. FINDINGS: Age-appropriate cerebral volume loss present. Prominent vascular calcifications within the carotid siphons. There is patchy hypodensity involving the gray matter of the left frontotemporal region, indeterminate, but may reflect sequela of a small ischemic infarct (series 2, image 12). No other acute large vessel territory infarct. Gray-white matter differentiation otherwise maintained. Deep gray nuclei are preserved. No acute intracranial hemorrhage. No extra-axial fluid collection. No midline shift or mass effect. No hydrocephalus. No mass lesion. Scalp soft tissues within normal limits. No acute abnormality about the orbits. Paranasal sinuses and mastoid air cells are clear. Calvarium intact. IMPRESSION: 1. Small focal parenchymal hypodensity within the left frontotemporal region, indeterminate, but may reflect a small evolving acute left MCA territory ischemic infarct. This could be further assessed with  dedicated MRI as clinically desired. 2. No other acute intracranial process. Electronically Signed   By: Jeannine Boga M.D.   On: 07/19/2015 04:41   I have personally reviewed and evaluated these images and lab results as part of my medical decision-making.   EKG Interpretation   Date/Time:  Thursday July 19 2015 03:48:31 EDT Ventricular Rate:  86 PR Interval:  253 QRS Duration: 90 QT Interval:  367 QTC Calculation: 439 R Axis:   39 Text Interpretation:  Sinus rhythm Prolonged PR interval Otherwise within  normal limits Since last tracing 15 Mar 2011 Normal sinus rhythm has  replaced Atrial fibrillation Confirmed by Teresina Bugaj  MD-I, Coren Crownover ([id number]) on  07/19/2015 4:27:18 AM      MDM   Final diagnoses:  First time seizure ([place])  Acute ischemic stroke ([place])   Plan transfer   CRITICAL CARE Performed by: Rolland Porter L Total critical care time: 39 minutes Critical care time was exclusive of separately billable procedures and treating other patients. Critical care was necessary to treat or prevent imminent or life-threatening deterioration. Critical care was time spent personally by me on the following activities: development of treatment plan with patient and/or surrogate as well as nursing, discussions with consultants, evaluation of patient's response to treatment, examination of patient,  obtaining history from patient or surrogate, ordering and performing treatments and interventions, ordering and review of laboratory studies, ordering and review of radiographic studies, pulse oximetry and re-evaluation of patient's condition.     Rolland Porter, MD 07/19/15 [phone]

## 2015-07-19 NOTE — Care Management Note (Signed)
Case Management Note  Patient Details  Name: MERRICK BEMUS MRN: [id number] Date of Birth: [birth year]  Subjective/Objective:                    Action/Plan: Patient was admitted with new onset seizure, stroke-like symptoms. Lives at home with spouse. Will follow for discharge needs pending PT/OT evals and physician orders.  Expected Discharge Date:                  Expected Discharge Plan:     In-House Referral:     Discharge planning Services     Post Acute Care Choice:    Choice offered to:     DME Arranged:    DME Agency:     HH Arranged:    HH Agency:     Status of Service:  In process, will continue to follow  Medicare Important Message Given:    Date Medicare IM Given:    Medicare IM give by:    Date Additional Medicare IM Given:    Additional Medicare Important Message give by:     If discussed at [place] of Stay Meetings, dates discussed:    Additional Comments:  Rolm Baptise, RN 07/19/2015, 11:27 AM [phone]

## 2015-07-19 NOTE — Progress Notes (Signed)
ED report received from [facility] at [phone] and pt arrived to the unit via EMS stretcher at [phone]. Pt A&O x4; pt oriented to the unit and room; IV intact SL; neuro check wnl; pt educated on fall/saftey precautions and prevention. VSS; telemetry applied and verified with CCMD; NT second verify; neurology paged and notified of pt arrival to the unit. Pt in bed with call light within reach and bed alarm on. Seizure precautions initiated. Delia Heady RN

## 2015-07-19 NOTE — Progress Notes (Addendum)
Patient seen and examined.  Patient has minimal recollection of the events from overnight. Per notes, his wife for noise in his room and found him lying on his bed without his CPAP machine on. He had generalized shaking and he was unresponsive. She called EMS and he was combative with EMS. He was administered benzodiazepine and Haldol which calmed him. He was transported to the emergency department where a CT scan suggested he might have had an acute stroke. He was transferred to [facility] for further evaluation. Currently, he denies any focal numbness, tingling, weakness, slurred speech, changes to vision, difficulty swallowing or speaking. He states he feels well. He states he has problems sometimes with his CPAP machine staying on. He takes Dilaudid regularly. Vital signs notable for mildly elevated blood pressure. On exam he has no focal neurologic deficits. Assessment and plan:  This is a 75 year old male whose history suggests that he may have had a hypoxic or hypercapnic event overnight which may have triggered a seizure. Alternatively, his CT scan suggests an acute stroke. Recommend acute TIA/stroke workup with MRI/MRA, echocardiogram, carotid duplex. Recommend physical and occupational therapy evaluations. He passed his strokes swallow screens Y will start him on a diet. Also recommend an EEG.  He has not been started on any AEDs at this time.  I have asked him to have his family bring in his CPAP machine. Plan to have him wear his home CPAP machine and monitor him on continuous pulse oximetry overnight to determine if there are any problems with his machine.

## 2015-07-19 NOTE — ED Notes (Signed)
Pt awake.  C/o headache.  States he feels ok.

## 2015-07-20 DIAGNOSIS — I10 Essential (primary) hypertension: Secondary | ICD-10-CM | POA: Diagnosis not present

## 2015-07-20 DIAGNOSIS — G4733 Obstructive sleep apnea (adult) (pediatric): Secondary | ICD-10-CM | POA: Diagnosis not present

## 2015-07-20 DIAGNOSIS — R569 Unspecified convulsions: Secondary | ICD-10-CM | POA: Diagnosis not present

## 2015-07-20 LAB — GLUCOSE, CAPILLARY
GLUCOSE-CAPILLARY: 113 mg/dL — AB (ref 65–99)
Glucose-Capillary: 84 mg/dL (ref 65–99)

## 2015-07-20 LAB — CBC
HEMATOCRIT: 39.8 % (ref 39.0–52.0)
Hemoglobin: 13.6 g/dL (ref 13.0–17.0)
MCH: 31.9 pg (ref 26.0–34.0)
MCHC: 34.2 g/dL (ref 30.0–36.0)
MCV: 93.2 fL (ref 78.0–100.0)
PLATELETS: 171 10*3/uL (ref 150–400)
RBC: 4.27 MIL/uL (ref 4.22–5.81)
RDW: 13 % (ref 11.5–15.5)
WBC: 5.9 10*3/uL (ref 4.0–10.5)

## 2015-07-20 LAB — COMPREHENSIVE METABOLIC PANEL
ALT: 41 U/L (ref 17–63)
ANION GAP: 10 (ref 5–15)
AST: 78 U/L — AB (ref 15–41)
Albumin: 3.3 g/dL — ABNORMAL LOW (ref 3.5–5.0)
Alkaline Phosphatase: 74 U/L (ref 38–126)
BILIRUBIN TOTAL: 1.1 mg/dL (ref 0.3–1.2)
BUN: 19 mg/dL (ref 6–20)
CHLORIDE: 104 mmol/L (ref 101–111)
CO2: 26 mmol/L (ref 22–32)
Calcium: 8.8 mg/dL — ABNORMAL LOW (ref 8.9–10.3)
Creatinine, Ser: 1.12 mg/dL (ref 0.61–1.24)
Glucose, Bld: 108 mg/dL — ABNORMAL HIGH (ref 65–99)
POTASSIUM: 4 mmol/L (ref 3.5–5.1)
Sodium: 140 mmol/L (ref 135–145)
TOTAL PROTEIN: 5.9 g/dL — AB (ref 6.5–8.1)

## 2015-07-20 LAB — LIPID PANEL
CHOL/HDL RATIO: 3.4 ratio
Cholesterol: 114 mg/dL (ref 0–200)
HDL: 34 mg/dL — ABNORMAL LOW (ref >40)
LDL Cholesterol: 61 mg/dL (ref 0–99)
Triglycerides: 95 mg/dL (ref <150)
VLDL: 19 mg/dL (ref 0–40)

## 2015-07-20 MED ORDER — LEVETIRACETAM 750 MG PO TABS: 750.0000 mg | ORAL_TABLET | Freq: Two times a day (BID) | ORAL | Status: DC

## 2015-07-20 NOTE — Progress Notes (Signed)
MD paged to see when patient can be d/c.

## 2015-07-20 NOTE — Discharge Summary (Addendum)
Physician Discharge Summary  Steven [name] [id number] DOB: 30-Oct-1940 DOA: 07/19/2015  PCP: Sallee Lange, MD  Admit date: 07/19/2015 Discharge date: 07/20/2015  Recommendations for Outpatient Follow-up:  1. Started on Keppra 750 mg twice a day 2. Follow-up with neurology within 1 month 3. Recommend referral for sleep study to make sure that his CPAP machine is working and fitted correctly  Discharge Diagnoses:  Principal Problem:   New onset seizure ([place]) Active Problems:   HTN (hypertension)   Chronic knee pain   Hx of post-op Afib   Morbid obesity ([place])   Obstructive sleep apnea   Seizure ([place])   Discharge Condition: Stable, improved  Diet recommendation: Healthy heart  Wt Readings from Last 3 Encounters:  07/19/15 136.079 kg (300 lb)  07/19/15 136.079 kg (300 lb)  06/04/15 135.988 kg (299 lb 12.8 oz)    History of present illness:   The patient is a 75 year old male with history of hypertension, chronic pain, obstructive sleep apnea who presented with seizure. His wife found him in the middle of the night lying across his bed jerking without his sleep apnea/CPAP machine on. He shook for several minutes and then stopped. He was agitated and confused after the event. His wife called EMS who needed to administer Valium and Haldol before he was able to be brought to the emergency department for evaluation. In the emergency department, his CT of the head demonstrated a possible left frontotemporal region acute stroke. He was transferred to [facility] from [facility] for further evaluation.  Hospital Course:   New onset seizure disorder.  He was seen by neurology who recommended additional workup for stroke. He remained without focal symptoms of weakness, numbness, slurred speech, facial droop, or confusion. His MRI demonstrated no evidence of acute intracranial abnormality and was stable compared to previous MRI in 2012. He did not undergo carotid duplex and  echocardiogram because there was low suspicion for TIA. His TEE was concerning for intermittent right temporal abnormal epileptiform discharges. Neurology recommended starting on Keppra 750 mg twice a day and following up with a neurologist of his choice within 1 month for further evaluation.  Hypertension, blood pressures remain stable on his home medications of lisinopril and nodular thiazide  Hyperlipidemia, stable, continued high-dose statin.  He had a mild elevation of his AST which is likely secondary to fatty liver and I did not discontinue his atorvastatin for this mildly abnormal lab.  Chronic pain, narcotic dependent, continued his home Dilaudid.   Procedures:  CT of the head  MRI brain  MRA brain  EEG  Consultations:  Neurology  Discharge Exam: Filed Vitals:   07/20/15 0547 07/20/15 0917  BP: 95/50 132/76  Pulse: 58 71  Temp: 97.6 F (36.4 C) 98.5 F (36.9 C)  Resp: 16 19   Filed Vitals:   07/20/15 0042 07/20/15 0115 07/20/15 0547 07/20/15 0917  BP:  113/71 95/50 132/76  Pulse: 68 58 58 71  Temp:  97.4 F (36.3 C) 97.6 F (36.4 C) 98.5 F (36.9 C)  TempSrc:  Oral Oral Oral  Resp: 18 18 16 19   Height:      Weight:      SpO2: 96% 96% 96% 100%    General: Obese male, no acute distress Cardiovascular: Regular rate and rhythm Respiratory: Clear to auscultation bilaterally Abdomen: NABS, soft, obese, nontender Neuro: No facial droop, cranial nerves II through XII grossly intact, strength 5 out of 5 symmetric throughout, no focal neurologic deficits, no dysmetria  Discharge Instructions      Discharge Instructions    Call MD for:  difficulty breathing, headache or visual disturbances    Complete by:  As directed      Call MD for:  extreme fatigue    Complete by:  As directed      Call MD for:  hives    Complete by:  As directed      Call MD for:  persistant dizziness or light-headedness    Complete by:  As directed      Call MD for:  persistant  nausea and vomiting    Complete by:  As directed      Call MD for:  severe uncontrolled pain    Complete by:  As directed      Call MD for:  temperature >100.4    Complete by:  As directed      Diet - low sodium heart healthy    Complete by:  As directed      Discharge instructions    Complete by:  As directed   Please schedule a neurology appointment within one month. Please talk to your primary care doctor about being refitted for your CPAP mask and making sure your settings are correct.     Driving Restrictions    Complete by:  As directed   No driving or operating heavy machinery, swimming or bathing unsupervised, limiting anything higher than knee height, or babysitting for a minimum of 6 months after last seizure or until cleared by her neurologist.     Increase activity slowly    Complete by:  As directed             Medication List    TAKE these medications        aspirin 81 MG tablet  Take 81 mg by mouth every evening.     atorvastatin 40 MG tablet  Commonly known as:  LIPITOR  TAKE ONE TABLET BY MOUTH ONCE DAILY.     cholecalciferol 1000 units tablet  Commonly known as:  VITAMIN D  Take 1,000 Units by mouth daily.     CITRUCEL 500 MG Tabs  Generic drug:  Methylcellulose (Laxative)  Take 1 tablet by mouth daily.     Fish Oil 1000 MG Caps  Take 1,000 mg by mouth daily.     HYDROmorphone 4 MG tablet  Commonly known as:  DILAUDID  Take 1 tablet (4 mg total) by mouth every 6 (six) hours as needed. Pain     levETIRAcetam 750 MG tablet  Commonly known as:  KEPPRA  Take 1 tablet (750 mg total) by mouth 2 (two) times daily.     lisinopril-hydrochlorothiazide 10-12.5 MG tablet  Commonly known as:  PRINZIDE,ZESTORETIC  TAKE (1) TABLET BY MOUTH EACH MORNING.     omeprazole 20 MG capsule  Commonly known as:  PRILOSEC  TAKE ONE CAPSULE BY MOUTH ONCE DAILY.     polyethylene glycol packet  Commonly known as:  MIRALAX / GLYCOLAX  Take 17 g by mouth daily as needed  for mild constipation.     VITAMIN B-12 PO  Take 1,000 mg by mouth daily.       Follow-up Information    Follow up with Sallee Lange, MD. Schedule an appointment as soon as possible for a visit in 1 week.   Specialty:  Family Medicine   Contact information:   [address] [place] [address] [phone]        The  results of significant diagnostics from this hospitalization (including imaging, microbiology, ancillary and laboratory) are listed below for reference.    Significant Diagnostic Studies: Ct Head Wo Contrast  07/19/2015  CLINICAL DATA:  Initial evaluation for acute seizure, headache. EXAM: CT HEAD WITHOUT CONTRAST TECHNIQUE: Contiguous axial images were obtained from the base of the skull through the vertex without intravenous contrast. COMPARISON:  Prior study from 08/30/2010. FINDINGS: Age-appropriate cerebral volume loss present. Prominent vascular calcifications within the carotid siphons. There is patchy hypodensity involving the gray matter of the left frontotemporal region, indeterminate, but may reflect sequela of a small ischemic infarct (series 2, image 12). No other acute large vessel territory infarct. Gray-white matter differentiation otherwise maintained. Deep gray nuclei are preserved. No acute intracranial hemorrhage. No extra-axial fluid collection. No midline shift or mass effect. No hydrocephalus. No mass lesion. Scalp soft tissues within normal limits. No acute abnormality about the orbits. Paranasal sinuses and mastoid air cells are clear. Calvarium intact. IMPRESSION: 1. Small focal parenchymal hypodensity within the left frontotemporal region, indeterminate, but may reflect a small evolving acute left MCA territory ischemic infarct. This could be further assessed with dedicated MRI as clinically desired. 2. No other acute intracranial process. Electronically Signed   By: Jeannine Boga M.D.   On: 07/19/2015 04:41   Mr Brain Wo  Contrast  07/19/2015  CLINICAL DATA:  75 year old male who became unresponsive, subsequent confusion and combative. Initial encounter. EXAM: MRI HEAD WITHOUT CONTRAST MRA HEAD WITHOUT CONTRAST TECHNIQUE: Multiplanar, multiecho pulse sequences of the brain and surrounding structures were obtained without intravenous contrast. Angiographic images of the head were obtained using MRA technique without contrast. COMPARISON:  Head CT without contrast 0409 hours today. Brain MRI 08/30/2010. FINDINGS: MRI HEAD FINDINGS Study is intermittently degraded by motion artifact despite repeated imaging attempts. Major intracranial vascular flow voids Are stable since 2012, dominant distal right vertebral artery. Cerebral volume also appear stable. No restricted diffusion to suggest acute infarction. No midline shift, mass effect, evidence of mass lesion, ventriculomegaly, extra-axial collection or acute intracranial hemorrhage. Cervicomedullary junction and pituitary are within normal limits. Stable mild for age nonspecific cerebral white matter signal changes since 2012. No finding in the left temporal lobe to correspond to the area on the earlier CT which seems to of been partial volume artifact. No cortical encephalomalacia or chronic cerebral blood products. Deep gray matter nuclei, brainstem, and cerebellum are normal for age. Visible internal auditory structures appear normal. Mastoids are clear. Decreased mild paranasal sinus mucosal thickening. Negative orbit and scalp soft tissues. MRA HEAD FINDINGS Antegrade flow in the posterior circulation with dominant distal right vertebral artery. Normal bilateral PICA origins. The left vertebral artery functionally terminates in PICA. Normal basilar artery. Normal SCA and right PCA origins. Fetal type left PCA origin. Right posterior communicating artery diminutive or absent. Bilateral PCA branches are within normal limits. Antegrade flow in both ICA siphons. Susceptibility artifact  at the right petrous apex related to pneumatized air cells. Similar but less pronounced findings on the left. No siphon stenosis identified. Left posterior communicating artery origin and carotid termini are normal. MCA and ACA origins are normal. The left ACA A2 segment is dominant. Visualized bilateral ACA and MCA branches are within normal limits. IMPRESSION: 1.  No acute intracranial abnormality. 2. Intermittently motion degraded study with stable appearing noncontrast MRI appearance of the brain since 2012. 3.  Negative intracranial MRA. Electronically Signed   By: Genevie Ann M.D.   On: 07/19/2015 13:18   Mr  Mra Head/brain Wo Cm  07/19/2015  CLINICAL DATA:  75 year old male who became unresponsive, subsequent confusion and combative. Initial encounter. EXAM: MRI HEAD WITHOUT CONTRAST MRA HEAD WITHOUT CONTRAST TECHNIQUE: Multiplanar, multiecho pulse sequences of the brain and surrounding structures were obtained without intravenous contrast. Angiographic images of the head were obtained using MRA technique without contrast. COMPARISON:  Head CT without contrast 0409 hours today. Brain MRI 08/30/2010. FINDINGS: MRI HEAD FINDINGS Study is intermittently degraded by motion artifact despite repeated imaging attempts. Major intracranial vascular flow voids Are stable since 2012, dominant distal right vertebral artery. Cerebral volume also appear stable. No restricted diffusion to suggest acute infarction. No midline shift, mass effect, evidence of mass lesion, ventriculomegaly, extra-axial collection or acute intracranial hemorrhage. Cervicomedullary junction and pituitary are within normal limits. Stable mild for age nonspecific cerebral white matter signal changes since 2012. No finding in the left temporal lobe to correspond to the area on the earlier CT which seems to of been partial volume artifact. No cortical encephalomalacia or chronic cerebral blood products. Deep gray matter nuclei, brainstem, and cerebellum  are normal for age. Visible internal auditory structures appear normal. Mastoids are clear. Decreased mild paranasal sinus mucosal thickening. Negative orbit and scalp soft tissues. MRA HEAD FINDINGS Antegrade flow in the posterior circulation with dominant distal right vertebral artery. Normal bilateral PICA origins. The left vertebral artery functionally terminates in PICA. Normal basilar artery. Normal SCA and right PCA origins. Fetal type left PCA origin. Right posterior communicating artery diminutive or absent. Bilateral PCA branches are within normal limits. Antegrade flow in both ICA siphons. Susceptibility artifact at the right petrous apex related to pneumatized air cells. Similar but less pronounced findings on the left. No siphon stenosis identified. Left posterior communicating artery origin and carotid termini are normal. MCA and ACA origins are normal. The left ACA A2 segment is dominant. Visualized bilateral ACA and MCA branches are within normal limits. IMPRESSION: 1.  No acute intracranial abnormality. 2. Intermittently motion degraded study with stable appearing noncontrast MRI appearance of the brain since 2012. 3.  Negative intracranial MRA. Electronically Signed   By: Genevie Ann M.D.   On: 07/19/2015 13:18    Microbiology: No results found for this or any previous visit (from the past 240 hour(s)).   Labs: Basic Metabolic Panel:  Recent Labs Lab 07/19/15 0337 07/20/15 0435  NA 138 140  K 3.7 4.0  CL 105 104  CO2 26 26  GLUCOSE 117* 108*  BUN 22* 19  CREATININE 0.95 1.12  CALCIUM 8.7* 8.8*   Liver Function Tests:  Recent Labs Lab 07/19/15 0337 07/20/15 0435  AST 26 78*  ALT 17 41  ALKPHOS 55 74  BILITOT 0.6 1.1  PROT 6.7 5.9*  ALBUMIN 3.6 3.3*   No results for input(s): LIPASE, AMYLASE in the last 168 hours. No results for input(s): AMMONIA in the last 168 hours. CBC:  Recent Labs Lab 07/19/15 0337 07/20/15 0435  WBC 7.8 5.9  NEUTROABS 6.0  --   HGB 13.7  13.6  HCT 40.7 39.8  MCV 92.5 93.2  PLT 187 171   Cardiac Enzymes:  Recent Labs Lab 07/19/15 0337  TROPONINI <0.03   BNP: BNP (last 3 results) No results for input(s): BNP in the last 8760 hours.  ProBNP (last 3 results) No results for input(s): PROBNP in the last 8760 hours.  CBG:  Recent Labs Lab 07/19/15 1642 07/19/15 2243 07/20/15 0630 07/20/15 1117  GLUCAP 107* 106* 113* 84  Time coordinating discharge: 35 minutes  Signed:  Dailey Alberson  [facility] 07/20/2015, 12:13 PM

## 2015-07-20 NOTE — Progress Notes (Signed)
Paged MD regarding patient's dc. Awaiting reply.

## 2015-07-21 LAB — HEMOGLOBIN A1C
HEMOGLOBIN A1C: 5.9 % — AB (ref 4.8–5.6)
Mean Plasma Glucose: 123 mg/dL

## 2015-07-30 ENCOUNTER — Ambulatory Visit (INDEPENDENT_AMBULATORY_CARE_PROVIDER_SITE_OTHER): Payer: PPO | Admitting: Family Medicine

## 2015-07-30 ENCOUNTER — Encounter: Payer: Self-pay | Admitting: Family Medicine

## 2015-07-30 ENCOUNTER — Telehealth: Payer: Self-pay | Admitting: Family Medicine

## 2015-07-30 VITALS — BP 128/82 | Ht 71.0 in | Wt 304.8 lb

## 2015-07-30 DIAGNOSIS — R569 Unspecified convulsions: Secondary | ICD-10-CM | POA: Diagnosis not present

## 2015-07-30 MED ORDER — LEVETIRACETAM 750 MG PO TABS: 750.0000 mg | ORAL_TABLET | Freq: Two times a day (BID) | ORAL | Status: DC

## 2015-07-30 MED ORDER — ATORVASTATIN CALCIUM 40 MG PO TABS: 40.0000 mg | ORAL_TABLET | Freq: Every day | ORAL | Status: DC

## 2015-07-30 NOTE — Progress Notes (Signed)
   Subjective:    Patient ID: Park Breed, male    DOB: [birth year], 75 y.o.   MRN: [id number]  HPI  Patient arrives for a follow up on recent hospitalization for new onset seizure  I reviewed over his lab work his CT scan and his MRI with the patient and his wife I answered questions about what causes seizures greater and 25 minutes spent with the patient patient wife relates that the patient was in bed she heard him approximately 1:30 in the morning it was apparent he was having a seizure he went for a post ictal phase and then was combative afterwards as well finally went to the ER via EMS and was treated for one day and released. Review of Systems Patient currently denies any headaches muscle pains chest pain shortness of breath    Objective:   Physical Exam Neck no masses lungs clear heart regular pulse normal abdomen obese extremities no edema       Assessment & Plan:  New onset seizures-patient needs continue medication refills given we will go ahead and set up with neurology. Patient will also benefit from further addressing of sleep apnea this could've been a trigger since he does not always wear his CPAP mask I also reviewed with him the urgency and importance of not operating heavy equipment such as driving tractor driving or lawnmower mowing. Patient will follow-up with [place] in one month 25 minutes spent with patient

## 2015-07-30 NOTE — Telephone Encounter (Signed)
Calling to let you know that patient does not want to see Dr. Sylvan Cheese for his neurology referral.

## 2015-08-13 ENCOUNTER — Encounter: Payer: Self-pay | Admitting: Neurology

## 2015-08-13 ENCOUNTER — Ambulatory Visit (INDEPENDENT_AMBULATORY_CARE_PROVIDER_SITE_OTHER): Payer: PPO | Admitting: Neurology

## 2015-08-13 VITALS — BP 122/80 | HR 68 | Resp 20 | Ht 70.0 in | Wt 315.0 lb

## 2015-08-13 DIAGNOSIS — G40209 Localization-related (focal) (partial) symptomatic epilepsy and epileptic syndromes with complex partial seizures, not intractable, without status epilepticus: Secondary | ICD-10-CM | POA: Insufficient documentation

## 2015-08-13 DIAGNOSIS — G40409 Other generalized epilepsy and epileptic syndromes, not intractable, without status epilepticus: Secondary | ICD-10-CM | POA: Diagnosis not present

## 2015-08-13 MED ORDER — LEVETIRACETAM 750 MG PO TABS: 750.0000 mg | ORAL_TABLET | Freq: Two times a day (BID) | ORAL | Status: DC

## 2015-08-13 NOTE — Progress Notes (Signed)
[id number]    Provider:  Dr Jaynee Eagles Referring Provider: Kathyrn Drown, MD Primary Care Physician:  Sallee Lange, MD  CC:  seizures  HPI:  Steven [name] is a 75 y.o. male here as a referral from Dr. Wolfgang Phoenix for seizures. PMHx obesity, HLD, CAD, HTN, chronic pain, SOB. This was his first seizure.  He has never had a seizure. No FHx of seizures. Wfe walked in and saw him jerking all 4 limbs, drooling on the sides of the mouth, eyes closed, she was trying to slap him on the face, she called 911. He became combative. He was "out of it" and he was fighting the EMS. He was given a sedative to get him on the stretcher. Didn't recognize is wife. He doesn't remember anything. No FHx seizures. No new medications. No previous illnesses. For about a month previous he was feeling funny. He is depressed about these changes. The funny feeling is better on the medication. No inciting events, no previous illnesses, no head trauma. Medication makes him a little tired. Event lasted briefly, a minute of two with associated hard breathing like he was snoring.   Reviewed notes, labs and imaging from outside physicians, which showed:   Reviewed notes from ED visit and hospitalization 07/19/2015: HPI patient presented via EMS. They had called from the patient's home due to patient being extremely combative. EMS had given him Versed 5 mg without relief. He was given Haldol 5 mg IM and they reported 5 minutes later he was cooperative. Patient stated he didn't remember what happened ,he woke up and people were wrestling him. Wife reported that the past month he has made comments off and on that he felt like he was dying however he could not tell her what that meant. He also said earlier that evening that "something is the matter with me". However he could not tell her what it was. She stated they had gone to bed and she heard a noise in his bedroom. When she went in the room he was jerking all over in his  bed and he was not responsive. She stated that did not last very long and then he was snoring loudly. When he started waking up he became extremely combative.  EMS reported his CBG was 161.   EEG 07/19/2015: Impression:  Abnormal awake and drowsy routine inpatient EEG suggestive of possible epileptogenic potential in the right temporal region. Clinical correlation is recommended .   MRI of the brain 07/19/2015 (personally reviewed images and agree with the following): 1. No acute intracranial abnormality. 2. Intermittently motion degraded study with stable appearing noncontrast MRI appearance of the brain since 2012. 3. Negative intracranial MRA    Review of Systems: Patient complains of symptoms per HPI as well as the following symptoms: fatigue, swelling in legs, snoring, joint pain, snoring, sleepiness, not enough sleep, decreased energy. Pertinent negatives per HPI. All others negative.   Social History   Social History  . Marital Status: Married    Spouse Name: N/A  . Number of Children: N/A  . Years of Education: N/A   Occupational History  . retired    Social History Main Topics  . Smoking status: Former Smoker    Start date: 12/24/1961  . Smokeless tobacco: Former Systems developer    Types: Chew    Quit date: 01/27/1973  . Alcohol Use: No  . Drug Use: No  . Sexual Activity: Yes    Birth Control/ Protection: None   Other Topics  Concern  . Not on file   Social History Narrative    Family History  Problem Relation Age of Onset  . Heart attack Father     mi I N HIS 40'S BUT LIVED INTO HIS 90'S WITH COPD  . COPD Father   . Cancer Father     colon  . Other Mother 67    died multiple med problems  . Heart disease Mother   . Hypertension Mother   . Heart attack Sister 79  . Seizures Neg Hx     Past Medical History  Diagnosis Date  . Obesity   . Dyslipidemia   . Coronary artery disease 02-27-11    Dr. Jacquiline Doe follows-has some blocked coronary arteries  ,not suitable for stent  placement  . Hypertension   . Shingles outbreak 02-27-11    2 weeks ago , was tx.-only residual is tenderness of right scalp-no open areas  . GERD (gastroesophageal reflux disease) 02-27-11    Acid reflux  . Hemorrhoids 02-27-11    not bothersome at this time  . Hearing loss 02-27-11    Bilateral hearing aids-due to exposure to loud machinery  . Urinary incontinence 02-27-11    not an everday occurrence-no special measures  . Osteoarthritis 02-27-11    Ostearthritis-knees, shoulders.Back causes chronic pain-radiates down right leg  . Chronic pain   . Hyperlipidemia   . Leg swelling   . Rash   . Complication of anesthesia     pt woke up during last surgery   . Dysrhythmia     hx of atrial fib during last hospitalization   . Shortness of breath     with exertion     Past Surgical History  Procedure Laterality Date  . Replacement total knee  02-27-11    right- 2007  . Total knee revision  03/05/2011    Procedure: TOTAL KNEE REVISION;  Surgeon: Dione Plover Aluisio;  Location: [facility];  Service: Orthopedics;  Laterality: Right;  . Injection knee  03/05/2011    Procedure: KNEE INJECTION;  Surgeon: Gearlean Alf;  Location: [facility];  Service: Orthopedics;  Laterality: Left;  80 mg depomedrol  . Cholecystectomy  03/14/2011    Procedure: LAPAROSCOPIC CHOLECYSTECTOMY;  Surgeon: Edward Jolly, MD;  Location: [facility];  Service: General;  Laterality: N/A;  . Laparoscopy  03/16/2011    Procedure: LAPAROSCOPY DIAGNOSTIC;  Surgeon: Judieth Keens, DO;  Location: [facility];  Service: General;  Laterality: N/A;  repair incisional hernia  . Total knee arthroplasty  03/01/2012    Procedure: TOTAL KNEE ARTHROPLASTY;  Surgeon: Gearlean Alf, MD;  Location: [facility];  Service: Orthopedics;  Laterality: Left;  . Colonoscopy      Current Outpatient Prescriptions  Medication Sig Dispense Refill  . aspirin 81 MG tablet Take 81 mg by mouth every evening.    Marland Kitchen atorvastatin  (LIPITOR) 40 MG tablet Take 1 tablet (40 mg total) by mouth daily. 30 tablet 2  . cholecalciferol (VITAMIN D) 1000 UNITS tablet Take 1,000 Units by mouth daily.    . Cyanocobalamin (VITAMIN B-12 PO) Take 1,000 mg by mouth daily.    Marland Kitchen HYDROmorphone (DILAUDID) 4 MG tablet Take 1 tablet (4 mg total) by mouth every 6 (six) hours as needed. Pain 120 tablet 0  . lisinopril-hydrochlorothiazide (PRINZIDE,ZESTORETIC) 10-12.5 MG per tablet TAKE (1) TABLET BY MOUTH EACH MORNING. 30 tablet 12  . Methylcellulose, Laxative, (CITRUCEL) 500 MG TABS Take 1 tablet by mouth daily.     Marland Kitchen  Omega-3 Fatty Acids (FISH OIL) 1000 MG CAPS Take 1,000 mg by mouth daily.    Marland Kitchen omeprazole (PRILOSEC) 20 MG capsule TAKE ONE CAPSULE BY MOUTH ONCE DAILY. 30 capsule 12  . polyethylene glycol (MIRALAX / GLYCOLAX) packet Take 17 g by mouth daily as needed for mild constipation.     No current facility-administered medications for this visit.    Allergies as of 08/13/2015 - Review Complete 08/13/2015  Allergen Reaction Noted  . Ciprofloxacin Rash 04/04/2011  . Oxycodone-acetaminophen Rash and Other (See Comments) 02/08/2010    Vitals: BP 122/80 mmHg  Pulse 68  Resp 20  Ht 5\' 10"  (1.778 m)  Wt 315 lb (142.883 kg)  BMI 45.20 kg/m2 Last Weight:  Wt Readings from Last 1 Encounters:  08/13/15 315 lb (142.883 kg)   Last Height:   Ht Readings from Last 1 Encounters:  08/13/15 5\' 10"  (1.778 m)   Physical exam: Exam: Gen: NAD, conversant, well nourised, morbidly obese, well groomed                     CV: RRR, no MRG. No Carotid Bruits. No peripheral edema, warm, nontender Eyes: Conjunctivae clear without exudates or hemorrhage  Neuro: Detailed Neurologic Exam  Speech:    Speech is normal; fluent and spontaneous with normal comprehension.  Cognition:    The patient is oriented to person, place, and time;     recent and remote memory intact;     language fluent;     normal attention, concentration,     fund of  knowledge Cranial Nerves:    The pupils are equal, round, and reactive to light. Attempted fundoscopic exam coul dnot visualize due to small pupils.  Visual fields are full to finger confrontation. Extraocular movements are intact. Trigeminal sensation is intact and the muscles of mastication are normal. The face is symmetric. The palate elevates in the midline. Hearing impaired. Voice is normal. Shoulder shrug is normal. The tongue has normal motion without fasciculations.   Coordination:    Normal finger to nose and heel to shin.  Gait:    Heel-toe gait are normal. Mildly wide based, large body habitus  Motor Observation:    No asymmetry, no atrophy, and no involuntary movements noted. Tone:    Normal muscle tone.    Posture:    Posture is normal. normal erect    Strength:    Strength is V/V in the upper and lower limbs.      Sensation: intact to LT     Reflex Exam:  DTR's:    Deep tendon reflexes in the upper and lower extremities are normal bilaterally.   Toes:    The toes are downgoing bilaterally.   Clonus:    Clonus is absent.       Assessment/Plan:    OSA: He is wearing a cpap but takes it off at night. He is being managed by his pcp for sleep apnea.  I don't specialize in sleep but I did discuss with Dr. Rexene Alberts and showed her the print out from his cpap out who said his events per hour are 5.5 and should be < 5, needs to follow up  with Dr. Wolfgang Phoenix for his mask problems, can increase pressure 11cmH2O and stressed compliance, discussed sequelae of untreated OSA.  Seizure: EEG was abnormal; awake and drowsy routine inpatient EEG suggestive of possible epileptogenic potential in the right temporal region. Clinical correlation is recommended . Continue Keppra 750mg  bid. Discussed compliance and that  seizures can cause morbidity and mortality.   Patient is unable to drive, operate heavy machinery, perform activities at heights or participate in water activities until 6  months seizure free  Need a prolonged EEG. They declined 24-72 hour video ambulatory EEG. Instead will order a 90 minute EEG here in the office. Continue Keppra 750mg  BID for at least 2 years if not life long.  Discussed Patients with epilepsy have a small risk of sudden unexpected death, a condition referred to as sudden unexpected death in epilepsy (SUDEP). SUDEP is defined specifically as the sudden, unexpected, witnessed or unwitnessed, nontraumatic and nondrowning death in patients with epilepsy with or without evidence for a seizure, and excluding documented status epilepticus, in which post mortem examination does not reveal a structural or toxicologic cause for death    Sarina Ill, MD  [facility] [address] [place], [place] [address]  Phone [phone] Fax [phone]

## 2015-08-13 NOTE — Addendum Note (Signed)
Addended by: Sarina Ill B on: 08/13/2015 07:59 PM   Modules accepted: Level of Service

## 2015-08-13 NOTE — Patient Instructions (Signed)
Remember to drink plenty of fluid, eat healthy meals and do not skip any meals. Try to eat protein with a every meal and eat a healthy snack such as fruit or nuts in between meals. Try to keep a regular sleep-wake schedule and try to exercise daily, particularly in the form of walking, 20-30 minutes a day, if you can.   As far as your medications are concerned, I would like to suggest: Continue Keppra, Can increase pressure to 11 and follow up with Dr. Wolfgang Phoenix.  As far as diagnostic testing: prolonged EEG  I would like to see you back in 4 months, sooner if we need to. Please call us with any interim questions, concerns, problems, updates or refill requests.   Our phone number is [phone]. We also have an after hours call service for urgent matters and there is a physician on-call for urgent questions. For any emergencies you know to call 911 or go to the nearest emergency room

## 2015-08-16 DIAGNOSIS — G4733 Obstructive sleep apnea (adult) (pediatric): Secondary | ICD-10-CM | POA: Diagnosis not present

## 2015-08-27 ENCOUNTER — Ambulatory Visit (INDEPENDENT_AMBULATORY_CARE_PROVIDER_SITE_OTHER): Payer: PPO | Admitting: Cardiovascular Disease

## 2015-08-27 ENCOUNTER — Encounter: Payer: Self-pay | Admitting: Cardiovascular Disease

## 2015-08-27 VITALS — BP 120/84 | HR 60 | Ht 71.0 in | Wt 309.2 lb

## 2015-08-27 DIAGNOSIS — I2510 Atherosclerotic heart disease of native coronary artery without angina pectoris: Secondary | ICD-10-CM

## 2015-08-27 NOTE — Patient Instructions (Signed)

## 2015-08-27 NOTE — Progress Notes (Signed)
Cardiology Office Note Date:  08/27/2015   ID:  Steven [name], DOB 1940/10/26, MRN [id number]  PCP:  Sallee Lange, MD  Cardiologist:  Sherren Mocha, MD    Chief Complaint  Patient presents with  . Coronary Artery Disease    no cp,sob,lee,claudication  . Hyperlipidemia  . essential hypertension     History of Present Illness: Steven [name] is a 75 y.o. male who presents for follow-up of coronary artery disease. The patient has been managed medicallywith an absence of angina. He also has a remote history of postoperative atrial fibrillation without recurrence. Cardiac catheterization in 2011 demonstrated distal branch vessel disease of the right coronary artery, minor nonobstructive stenosis of the LAD, and small vessel occlusion of the distal left circumflex with left to left collaterals. LV function has been normal. Since his visit last year he was hospitalized with a seizure. He has been started on Keppra. He has sleep apnea and wears CPAP, but has trouble tolerating the mask. Complains of generalized fatigue, but no exertional symptoms. He's on chronic narcotics for back and knee pain.   He still struggles with his weight. Says that eating is still one thing he really enjoys doing. He is not able to exercise much because of his arthritis. He denies chest pain, chest pressure, orthopnea, or PND. He has some chronic leg swelling on the right side where he has had 2 knee replacements.  Past Medical History  Diagnosis Date  . Obesity   . Dyslipidemia   . Coronary artery disease 02-27-11    Dr. Jacquiline Doe follows-has some blocked coronary arteries ,not suitable for stent  placement  . Hypertension   . Shingles outbreak 02-27-11    2 weeks ago , was tx.-only residual is tenderness of right scalp-no open areas  . GERD (gastroesophageal reflux disease) 02-27-11    Acid reflux  . Hemorrhoids 02-27-11    not bothersome at this time  . Hearing loss 02-27-11    Bilateral  hearing aids-due to exposure to loud machinery  . Urinary incontinence 02-27-11    not an everday occurrence-no special measures  . Osteoarthritis 02-27-11    Ostearthritis-knees, shoulders.Back causes chronic pain-radiates down right leg  . Chronic pain   . Hyperlipidemia   . Leg swelling   . Rash   . Complication of anesthesia     pt woke up during last surgery   . Dysrhythmia     hx of atrial fib during last hospitalization   . Shortness of breath     with exertion     Past Surgical History  Procedure Laterality Date  . Replacement total knee  02-27-11    right- 2007  . Total knee revision  03/05/2011    Procedure: TOTAL KNEE REVISION;  Surgeon: Dione Plover Aluisio;  Location: [facility];  Service: Orthopedics;  Laterality: Right;  . Injection knee  03/05/2011    Procedure: KNEE INJECTION;  Surgeon: Gearlean Alf;  Location: [facility];  Service: Orthopedics;  Laterality: Left;  80 mg depomedrol  . Cholecystectomy  03/14/2011    Procedure: LAPAROSCOPIC CHOLECYSTECTOMY;  Surgeon: Edward Jolly, MD;  Location: [facility];  Service: General;  Laterality: N/A;  . Laparoscopy  03/16/2011    Procedure: LAPAROSCOPY DIAGNOSTIC;  Surgeon: Judieth Keens, DO;  Location: [facility];  Service: General;  Laterality: N/A;  repair incisional hernia  . Total knee arthroplasty  03/01/2012    Procedure: TOTAL KNEE ARTHROPLASTY;  Surgeon: Gearlean Alf, MD;  Location:  [facility];  Service: Orthopedics;  Laterality: Left;  . Colonoscopy      Current Outpatient Prescriptions  Medication Sig Dispense Refill  . aspirin 81 MG tablet Take 81 mg by mouth every evening.    Marland Kitchen atorvastatin (LIPITOR) 40 MG tablet Take 1 tablet (40 mg total) by mouth daily. 30 tablet 2  . cholecalciferol (VITAMIN D) 1000 UNITS tablet Take 1,000 Units by mouth daily.    . Cyanocobalamin (VITAMIN B-12 PO) Take 1,000 mg by mouth daily.    Marland Kitchen HYDROmorphone (DILAUDID) 4 MG tablet Take 1 tablet (4 mg total) by mouth every 6 (six) hours as  needed. Pain 120 tablet 0  . levETIRAcetam (KEPPRA) 750 MG tablet Take 1 tablet (750 mg total) by mouth 2 (two) times daily. 60 tablet 12  . lisinopril-hydrochlorothiazide (PRINZIDE,ZESTORETIC) 10-12.5 MG per tablet TAKE (1) TABLET BY MOUTH EACH MORNING. 30 tablet 12  . Methylcellulose, Laxative, (CITRUCEL) 500 MG TABS Take 1 tablet by mouth daily.     . Omega-3 Fatty Acids (FISH OIL) 1000 MG CAPS Take 1,000 mg by mouth daily.    Marland Kitchen omeprazole (PRILOSEC) 20 MG capsule TAKE ONE CAPSULE BY MOUTH ONCE DAILY. 30 capsule 12  . polyethylene glycol (MIRALAX / GLYCOLAX) packet Take 17 g by mouth daily as needed for mild constipation.     No current facility-administered medications for this visit.    Allergies:   Ciprofloxacin and Oxycodone-acetaminophen   Social History:  The patient  reports that he has quit smoking. He started smoking about 53 years ago. He quit smokeless tobacco use about 42 years ago. His smokeless tobacco use included Chew. He reports that he does not drink alcohol or use illicit drugs.   Family History:  The patient's  family history includes COPD in his father; Cancer in his father; Heart attack in his father; Heart attack (age of onset: 75) in his sister; Heart disease in his mother; Hypertension in his mother; Other (age of onset: 37) in his mother. There is no history of Seizures.    ROS:  Please see the history of present illness.  Otherwise, review of systems is positive for depression.  All other systems are reviewed and negative.    PHYSICAL EXAM: VS:  BP 120/84 mmHg  Pulse 60  Ht 5\' 11"  (1.803 m)  Wt 309 lb 3.2 oz (140.252 kg)  BMI 43.14 kg/m2 , BMI Body mass index is 43.14 kg/(m^2). GEN: Well nourished, well developed, pleasant obese male in no acute distress HEENT: normal Neck: no JVD, no masses. No carotid bruits Cardiac: RRR without murmur or gallop                Respiratory:  clear to auscultation bilaterally, normal work of breathing GI: soft, nontender,  nondistended, + BS MS: no deformity or atrophy Ext: trace pretibial edema bilaterally Skin: warm and dry, no rash Neuro:  Strength and sensation are intact Psych: euthymic mood, full affect  EKG:  EKG is not ordered today.  Recent Labs: 07/20/2015: ALT 41; BUN 19; Creatinine, Ser 1.12; Hemoglobin 13.6; Platelets 171; Potassium 4.0; Sodium 140   Lipid Panel     Component Value Date/Time   CHOL 114 07/20/2015 0435   CHOL 130 02/01/2015 0810   TRIG 95 07/20/2015 0435   HDL 34* 07/20/2015 0435   HDL 39* 02/01/2015 0810   CHOLHDL 3.4 07/20/2015 0435   CHOLHDL 3.3 02/01/2015 0810   VLDL 19 07/20/2015 0435   LDLCALC 61 07/20/2015 0435   LDLCALC  67 02/01/2015 0810      Wt Readings from Last 3 Encounters:  08/27/15 309 lb 3.2 oz (140.252 kg)  08/13/15 315 lb (142.883 kg)  07/30/15 304 lb 12.8 oz (138.256 kg)     ASSESSMENT AND PLAN: 1. CAD, native vessel: The patient is stable without symptoms of angina. He continues on aspirin and a statin drug. I will see him back in one year.  2. Hyperlipidemia: The patient is on atorvastatin 40 mg daily. He's followed by his primary care physician. Lipids reviewed from last month and LDL is at goal. Extensive discussion about lifestyle modification today.  3. Essential hypertension: Blood pressure well controlled on lisinopril/hydrochlorothiazide.   4. Obesity: He understands that obesity is a major factor in his obstructive sleep apnea, risk of atrial fibrillation recurring, and other medical problems. We discussed weight loss strategies but I am doubtful he'll be able to accomplish much weight loss at this point.  Current medicines are reviewed with the patient today.  The patient does not have concerns regarding medicines.  Labs/ tests ordered today include:  No orders of the defined types were placed in this encounter.    Disposition:   FU one year  Signed, Sherren Mocha, MD  08/27/2015 11:05 AM    [address], [place], [place]  [address] Phone: [phone]; Fax: [phone]

## 2015-08-29 ENCOUNTER — Ambulatory Visit: Payer: PPO | Admitting: Family Medicine

## 2015-09-12 ENCOUNTER — Ambulatory Visit (INDEPENDENT_AMBULATORY_CARE_PROVIDER_SITE_OTHER): Payer: PPO | Admitting: Family Medicine

## 2015-09-12 ENCOUNTER — Encounter: Payer: Self-pay | Admitting: Family Medicine

## 2015-09-12 VITALS — BP 120/70 | Ht 71.0 in | Wt 307.4 lb

## 2015-09-12 DIAGNOSIS — M549 Dorsalgia, unspecified: Secondary | ICD-10-CM

## 2015-09-12 DIAGNOSIS — M25569 Pain in unspecified knee: Secondary | ICD-10-CM

## 2015-09-12 DIAGNOSIS — G8929 Other chronic pain: Secondary | ICD-10-CM

## 2015-09-12 MED ORDER — HYDROMORPHONE HCL 4 MG PO TABS: 4.0000 mg | ORAL_TABLET | Freq: Four times a day (QID) | ORAL | Status: DC | PRN

## 2015-09-12 MED ORDER — HYDROMORPHONE HCL 4 MG PO TABS
4.0000 mg | ORAL_TABLET | Freq: Four times a day (QID) | ORAL | Status: DC | PRN
Start: 2015-09-12 — End: 2015-09-12

## 2015-09-12 NOTE — Progress Notes (Signed)
   Subjective:    Patient ID: Steven [name], male    DOB: 1941/03/30, 75 y.o.   MRN: [id number]  HPI Patient is here today for a follow up visit on seizures.  Patient states that he needs a refill on his pain medication also.   Patient has no concerns at this time.  Chronic pain was reviewed with the patient currently he is doing well with his medication. He denies abusing it takes 3 or 4 per day. He states does a good job relieving his discomfort making it so he is functional. He denies any drowsiness or passing out. He states he is not driving. 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 I did tell him that he should not be doing this because of risk of if he had a seizure it could be deadly.  Review of Systems Denies any new seizures denies headache shortness of breath coughing vomiting diarrhea    Objective:   Physical Exam Lungs clear hearts regular subjective low back tenderness subjective knee pain extremities no edema neurologic grossly normal       Assessment & Plan:  Chronic pain The patient was seen today as part of a comprehensive visit regarding pain control. Patient's compliance with the medication as well as discussion regarding effectiveness was completed. Prescriptions were written. Patient was advised to follow-up in 3 months. The patient was assessed for any signs of severe side effects. The patient was advised to take the medicine as directed and to report to [place] if any side effect issues. No seizures currently but patient was advised not to operate any heavy equipment or drive until seizure free for 6 months He is to follow-up with neurology as recommended His prescriptions were given to him he will need comprehensive check in the fall along with lab work

## 2015-09-13 ENCOUNTER — Telehealth: Payer: Self-pay | Admitting: *Deleted

## 2015-09-13 ENCOUNTER — Ambulatory Visit (INDEPENDENT_AMBULATORY_CARE_PROVIDER_SITE_OTHER): Payer: PPO | Admitting: Neurology

## 2015-09-13 DIAGNOSIS — G40209 Localization-related (focal) (partial) symptomatic epilepsy and epileptic syndromes with complex partial seizures, not intractable, without status epilepticus: Secondary | ICD-10-CM

## 2015-09-13 DIAGNOSIS — G40409 Other generalized epilepsy and epileptic syndromes, not intractable, without status epilepticus: Secondary | ICD-10-CM

## 2015-09-13 NOTE — Telephone Encounter (Signed)
Called and spoke to wife. Wife stated pt cannot hear well on the phone. Advised per Dr Jaynee Eagles EEG looks good and to continue current medication. Told her to call us if he has new/worsening sx. She verbalized understanding.

## 2015-09-13 NOTE — Telephone Encounter (Signed)
-----   Message from Melvenia Beam, MD sent at 09/13/2015  5:21 PM EDT ----- EEG looks good, continue on current medication thanks

## 2015-09-13 NOTE — Procedures (Signed)
    History:  Malakii Harl is a 75 year old gentleman who was noted to have an event associated with jerking on all fours, confusion, drooling from the mouth. This occurred around 07/19/2015. The patient has undergone a prior EEG study showing irritability in the form of sharp waves emanating from the right temporal area. The patient is being reevaluated for the seizure event.  This is a prolonged EEG. No skull defects are noted. Medications include aspirin, Lipitor, vitamin D, Dilaudid, Prinzide, fish oil, Prilosec, and MiraLAX.   EEG classification: Normal awake and drowsy  Description of the recording: The background rhythms of this recording consists of a fairly well modulated medium amplitude alpha rhythm of 9 Hz that is reactive to eye opening and closure. As the record progresses, the patient appears to remain in the waking state throughout the recording. Photic stimulation was nor performed. Hyperventilation was performed, resulting in a minimal buildup of the background rhythm activities without significant slowing seen. Toward the end of the recording, the patient enters the drowsy state with slight symmetric slowing seen. The patient never enters stage II sleep. At no time during the recording does there appear to be evidence of spike or spike wave discharges or evidence of focal slowing. EKG monitor shows frequent cardiac irregularity with a heart rate of 60.  Impression: This is a normal EEG recording in the waking and drowsy state. No evidence of ictal or interictal discharges are seen.

## 2015-12-07 ENCOUNTER — Other Ambulatory Visit: Payer: Self-pay | Admitting: Family Medicine

## 2015-12-11 ENCOUNTER — Encounter: Payer: Self-pay | Admitting: Family Medicine

## 2015-12-11 ENCOUNTER — Ambulatory Visit (INDEPENDENT_AMBULATORY_CARE_PROVIDER_SITE_OTHER): Payer: PPO | Admitting: Family Medicine

## 2015-12-11 VITALS — BP 114/80 | Ht 71.0 in | Wt 311.4 lb

## 2015-12-11 DIAGNOSIS — R7303 Prediabetes: Secondary | ICD-10-CM | POA: Diagnosis not present

## 2015-12-11 DIAGNOSIS — G8929 Other chronic pain: Secondary | ICD-10-CM

## 2015-12-11 DIAGNOSIS — I10 Essential (primary) hypertension: Secondary | ICD-10-CM

## 2015-12-11 DIAGNOSIS — M549 Dorsalgia, unspecified: Secondary | ICD-10-CM | POA: Diagnosis not present

## 2015-12-11 DIAGNOSIS — L821 Other seborrheic keratosis: Secondary | ICD-10-CM | POA: Diagnosis not present

## 2015-12-11 DIAGNOSIS — Z125 Encounter for screening for malignant neoplasm of prostate: Secondary | ICD-10-CM | POA: Diagnosis not present

## 2015-12-11 DIAGNOSIS — Z79891 Long term (current) use of opiate analgesic: Secondary | ICD-10-CM | POA: Diagnosis not present

## 2015-12-11 DIAGNOSIS — L570 Actinic keratosis: Secondary | ICD-10-CM | POA: Diagnosis not present

## 2015-12-11 DIAGNOSIS — D239 Other benign neoplasm of skin, unspecified: Secondary | ICD-10-CM | POA: Diagnosis not present

## 2015-12-11 MED ORDER — HYDROMORPHONE HCL 4 MG PO TABS: 4.0000 mg | ORAL_TABLET | Freq: Four times a day (QID) | ORAL | 0 refills | Status: DC | PRN

## 2015-12-11 NOTE — Telephone Encounter (Signed)
ERROR

## 2015-12-11 NOTE — Progress Notes (Signed)
   Subjective:    Patient ID: Steven [name], male    DOB: Jul 12, 1940, 75 y.o.   MRN: [id number]  HPI  This patient was seen today for chronic pain  The medication list was reviewed and updated.   -Compliance with medication: Yes, takes daily.  - Number patient states they take daily: Takes 4 tablets a day.  -when was the last dose patient took? This morning.   The patient was advised the importance of maintaining medication and not using illegal substances with these.  Refills needed: yes  The patient was educated that we can provide 3 monthly scripts for their medication, it is their responsibility to follow the instructions.  Side effects or complications from medications: Constipation  Patient is aware that pain medications are meant to minimize the severity of the pain to allow their pain levels to improve to allow for better function. They are aware of that pain medications cannot totally remove their pain.  Due for UDT ( at least once per year) : yes due today.  This patient has history of significant obesity a obesity obesity along with osteoarthritis of the knee as well as chronic low back pain he is seen a back specialist they have had him on and a lot of that has done well to keep a monitor control  He has morbid obesity this affects his physical endurance and capabilities  He also has a history of hyperlipidemia takes medication for this History of hypertension takes medication for this History of seizure under the care of neurology no driving for 6 months from the last seizure.   25 minutes was spent with the patient. Greater than half the time was spent in discussion and answering questions and counseling regarding the issues that the patient came in for today.   Review of Systems  Constitutional: Negative for activity change.  Gastrointestinal: Negative for abdominal pain and vomiting.  Neurological: Negative for weakness.  Psychiatric/Behavioral: Negative for  confusion.       Objective:   Physical Exam  Constitutional: He appears well-nourished.  Cardiovascular: Normal rate, regular rhythm and normal heart sounds.   No murmur heard. Pulmonary/Chest: Effort normal and breath sounds normal.  Musculoskeletal: He exhibits no edema.  Lymphadenopathy:    He has no cervical adenopathy.  Neurological: He is alert.  Psychiatric: His behavior is normal.  Vitals reviewed.         Assessment & Plan:  1. Chronic back pain The patient was seen today as part of a comprehensive visit regarding pain control. Patient's compliance with the medication as well as discussion regarding effectiveness was completed. Prescriptions were written. Patient was advised to follow-up in 3 months. The patient was assessed for any signs of severe side effects. The patient was advised to take the medicine as directed and to report to [place] if any side effect issues. The patient does well on Dilaudid. He takes up to 4 per day. He states it does not cause drowsiness or sleepiness.  2. Essential hypertension Blood pressure under good control check metabolic 7. Continue medication - Basic metabolic panel  3. Prediabetes Watch diet closely. Minimize starches. Try to lose weight try to stay active - Hemoglobin A1C  4. Screening PSA (prostate specific antigen) Screening test today - PSA  5. Encounter for long-term opiate analgesic use Pain medication profile recommended - ToxASSURE Select 13 (MW), Urine  6. Morbid obesity due to excess calories [facility]) Patient encouraged to try to lose weight.

## 2015-12-12 DIAGNOSIS — Z79891 Long term (current) use of opiate analgesic: Secondary | ICD-10-CM | POA: Diagnosis not present

## 2015-12-20 LAB — TOXASSURE SELECT 13 (MW), URINE: PDF: 0

## 2015-12-31 ENCOUNTER — Encounter: Payer: Self-pay | Admitting: Neurology

## 2015-12-31 ENCOUNTER — Ambulatory Visit (INDEPENDENT_AMBULATORY_CARE_PROVIDER_SITE_OTHER): Payer: PPO | Admitting: Neurology

## 2015-12-31 VITALS — BP 125/85 | HR 70 | Ht 71.0 in | Wt 313.6 lb

## 2015-12-31 DIAGNOSIS — G40209 Localization-related (focal) (partial) symptomatic epilepsy and epileptic syndromes with complex partial seizures, not intractable, without status epilepticus: Secondary | ICD-10-CM

## 2015-12-31 MED ORDER — LEVETIRACETAM 500 MG PO TABS: 500.0000 mg | ORAL_TABLET | Freq: Two times a day (BID) | ORAL | 6 refills | Status: DC

## 2015-12-31 NOTE — Patient Instructions (Signed)
Remember to drink plenty of fluid, eat healthy meals and do not skip any meals. Try to eat protein with a every meal and eat a healthy snack such as fruit or nuts in between meals. Try to keep a regular sleep-wake schedule and try to exercise daily, particularly in the form of walking, 20-30 minutes a day, if you can.   As far as your medications are concerned, I would like to suggest: Decrease Keppra 500mg  twice a day  I would like to see you back in 6-9 months, sooner if we need to. Please call us with any interim questions, concerns, problems, updates or refill requests.   Our phone number is [phone]. We also have an after hours call service for urgent matters and there is a physician on-call for urgent questions. For any emergencies you know to call 911 or go to the nearest emergency room

## 2015-12-31 NOTE — Progress Notes (Signed)
[id number]    Provider:  Dr Jaynee Eagles Referring Provider: Kathyrn Drown, MD Primary Care Physician:  Sallee Lange, MD CC:  seizures   Interval history 12/31/2015 : No episodes since starting the [place]. He is a little dizzy. Since starting the [place]. He doesn't feel like he has his balance at times. No room spinning. Only when standing and walking he feel lightheaded. Not when sitting. He drinks 6 glasses a day. His blood pressure usually run about 120/70. Discussed differential which could be medication related, orthostatic hypotension. We decided to decreased his Keppra to 500mg  twice a day, still a therapeutic dose. If he has any events he is to call me asap to increase. He is going to drink extra water. Will also check his orthi vitals today. If he continues to feel lightheaded we can change his AED.  HPI:  Steven [name] is a 75 y.o. male here as a referral from Dr. Wolfgang Phoenix for seizures. PMHx obesity, HLD, CAD, HTN, chronic pain, SOB. This was his first seizure.  He has never had a seizure. No FHx of seizures. Wfe walked in and saw him jerking all 4 limbs, drooling on the sides of the mouth, eyes closed, she was trying to slap him on the face, she called 911. He became combative. He was "out of it" and he was fighting the EMS. He was given a sedative to get him on the stretcher. Didn't recognize is wife. He doesn't remember anything. No FHx seizures. No new medications. No previous illnesses. For about a month previous he was feeling funny. He is depressed about these changes. The funny feeling is better on the medication. No inciting events, no previous illnesses, no head trauma. Medication makes him a little tired. Event lasted briefly, a minute of two with associated hard breathing like he was snoring.   Reviewed notes, labs and imaging from outside physicians, which showed:   Reviewed notes from ED visit and hospitalization 07/19/2015: HPI patient presented via EMS.  They had called from the patient's home due to patient being extremely combative. EMS had given him Versed 5 mg without relief. He was given Haldol 5 mg IM and they reported 5 minutes later he was cooperative. Patient stated he didn't remember what happened ,he woke up and people were wrestling him. Wife reported that the past month he has made comments off and on that he felt like he was dying however he could not tell her what that meant. He also said earlier that evening that "something is the matter with me". However he could not tell her what it was. She stated they had gone to bed and she heard a noise in his bedroom. When she went in the room he was jerking all over in his bed and he was not responsive. She stated that did not last very long and then he was snoring loudly. When he started waking up he became extremely combative.  EMS reported his CBG was 161.   EEG 07/19/2015: Impression:  Abnormal awake and drowsy routine inpatient EEG suggestive of possible epileptogenic potential in the right temporal region. Clinical correlation is recommended .   MRI of the brain 07/19/2015 (personally reviewed images and agree with the following): 1. No acute intracranial abnormality. 2. Intermittently motion degraded study with stable appearing noncontrast MRI appearance of the brain since 2012. 3. Negative intracranial MRA    Review of Systems: Patient complains of symptoms per HPI as well as the following symptoms: fatigue, swelling  in legs, snoring, joint pain, snoring, sleepiness, not enough sleep, decreased energy. Pertinent negatives per HPI. All others negative.   Social History   Social History  . Marital status: Married    Spouse name: N/A  . Number of children: N/A  . Years of education: N/A   Occupational History  . retired Retired   Social History Main Topics  . Smoking status: Former Smoker    Start date: 12/24/1961  . Smokeless tobacco: Former Systems developer    Types: Chew     Quit date: 01/27/1973  . Alcohol use No  . Drug use: No  . Sexual activity: Yes    Birth control/ protection: None   Other Topics Concern  . Not on file   Social History Narrative  . No narrative on file    Family History  Problem Relation Age of Onset  . Heart attack Father     mi I N HIS 40'S BUT LIVED INTO HIS 90'S WITH COPD  . COPD Father   . Cancer Father     colon  . Other Mother 78    died multiple med problems  . Heart disease Mother   . Hypertension Mother   . Heart attack Sister 1  . Seizures Neg Hx     Past Medical History:  Diagnosis Date  . Chronic pain   . Complication of anesthesia    pt woke up during last surgery   . Coronary artery disease 02-27-11   Dr. Jacquiline Doe follows-has some blocked coronary arteries ,not suitable for stent  placement  . Dyslipidemia   . Dysrhythmia    hx of atrial fib during last hospitalization   . GERD (gastroesophageal reflux disease) 02-27-11   Acid reflux  . Hearing loss 02-27-11   Bilateral hearing aids-due to exposure to loud machinery  . Hemorrhoids 02-27-11   not bothersome at this time  . Hyperlipidemia   . Hypertension   . Leg swelling   . Obesity   . Osteoarthritis 02-27-11   Ostearthritis-knees, shoulders.Back causes chronic pain-radiates down right leg  . Rash   . Shingles outbreak 02-27-11   2 weeks ago , was tx.-only residual is tenderness of right scalp-no open areas  . Shortness of breath    with exertion   . Urinary incontinence 02-27-11   not an everday occurrence-no special measures    Past Surgical History:  Procedure Laterality Date  . CHOLECYSTECTOMY  03/14/2011   Procedure: LAPAROSCOPIC CHOLECYSTECTOMY;  Surgeon: Edward Jolly, MD;  Location: [facility];  Service: General;  Laterality: N/A;  . COLONOSCOPY    . INJECTION KNEE  03/05/2011   Procedure: KNEE INJECTION;  Surgeon: Gearlean Alf;  Location: [facility];  Service: Orthopedics;  Laterality: Left;  80 mg depomedrol  .  LAPAROSCOPY  03/16/2011   Procedure: LAPAROSCOPY DIAGNOSTIC;  Surgeon: Judieth Keens, DO;  Location: [facility];  Service: General;  Laterality: N/A;  repair incisional hernia  . REPLACEMENT TOTAL KNEE  02-27-11   right- 2007  . TOTAL KNEE ARTHROPLASTY  03/01/2012   Procedure: TOTAL KNEE ARTHROPLASTY;  Surgeon: Gearlean Alf, MD;  Location: [facility];  Service: Orthopedics;  Laterality: Left;  . TOTAL KNEE REVISION  03/05/2011   Procedure: TOTAL KNEE REVISION;  Surgeon: Dione Plover Aluisio;  Location: [facility];  Service: Orthopedics;  Laterality: Right;    Current Outpatient Prescriptions  Medication Sig Dispense Refill  . aspirin 81 MG tablet Take 81 mg by mouth every evening.    Marland Kitchen  atorvastatin (LIPITOR) 40 MG tablet TAKE ONE TABLET BY MOUTH ONCE DAILY. 30 tablet 2  . cholecalciferol (VITAMIN D) 1000 UNITS tablet Take 1,000 Units by mouth daily.    . Cyanocobalamin (VITAMIN B-12 PO) Take 1,000 mg by mouth daily.    Marland Kitchen HYDROmorphone (DILAUDID) 4 MG tablet Take 1 tablet (4 mg total) by mouth every 6 (six) hours as needed. Pain 120 tablet 0  . lisinopril-hydrochlorothiazide (PRINZIDE,ZESTORETIC) 10-12.5 MG tablet TAKE (1) TABLET BY MOUTH EACH MORNING. 30 tablet 2  . Methylcellulose, Laxative, (CITRUCEL) 500 MG TABS Take 1 tablet by mouth daily.     . Omega-3 Fatty Acids (FISH OIL) 1000 MG CAPS Take 1,000 mg by mouth daily.    Marland Kitchen omeprazole (PRILOSEC) 20 MG capsule TAKE ONE CAPSULE BY MOUTH ONCE DAILY. 30 capsule 2  . polyethylene glycol (MIRALAX / GLYCOLAX) packet Take 17 g by mouth daily as needed for mild constipation.    . levETIRAcetam (KEPPRA) 500 MG tablet Take 1 tablet (500 mg total) by mouth 2 (two) times daily. 180 tablet 6   No current facility-administered medications for this visit.     Allergies as of 12/31/2015 - Review Complete 12/31/2015  Allergen Reaction Noted  . Ciprofloxacin Rash 04/04/2011  . Oxycodone-acetaminophen Rash and Other (See Comments) 02/08/2010    Vitals: BP  125/85 (BP Location: Right Arm, Patient Position: Sitting, Cuff Size: Large)   Pulse 70   Ht 5\' 11"  (1.803 m)   Wt (!) 313 lb 9.6 oz (142.2 kg)   BMI 43.74 kg/m  Last Weight:  Wt Readings from Last 1 Encounters:  12/31/15 (!) 313 lb 9.6 oz (142.2 kg)   Last Height:   Ht Readings from Last 1 Encounters:  12/31/15 5\' 11"  (1.803 m)     Physical exam: Exam: Gen: NAD, conversant, well nourised, morbidly obese, well groomed                     CV: RRR, no MRG. No Carotid Bruits. No peripheral edema, warm, nontender Eyes: Conjunctivae clear without exudates or hemorrhage  Neuro: Detailed Neurologic Exam  Speech:    Speech is normal; fluent and spontaneous with normal comprehension.  Cognition:    The patient is oriented to person, place, and time;     recent and remote memory intact;     language fluent;     normal attention, concentration,     fund of knowledge Cranial Nerves:    The pupils are equal, round, and reactive to light. Attempted fundoscopic exam coul dnot visualize due to small pupils.  Visual fields are full to finger confrontation. Extraocular movements are intact. Trigeminal sensation is intact and the muscles of mastication are normal. The face is symmetric. The palate elevates in the midline. Hearing impaired. Voice is normal. Shoulder shrug is normal. The tongue has normal motion without fasciculations.   Coordination:    Normal finger to nose and heel to shin.  Gait:    Heel-toe gait are normal. Mildly wide based, large body habitus  Motor Observation:    No asymmetry, no atrophy, and no involuntary movements noted. Tone:    Normal muscle tone.    Posture:    Posture is normal. normal erect    Strength:    Strength is V/V in the upper and lower limbs.      Sensation: intact to LT     Reflex Exam:  DTR's:    Deep tendon reflexes in the upper and lower extremities are  normal bilaterally.   Toes:    The toes are downgoing bilaterally.     Clonus:    Clonus is absent.    Orthostatic VS for the past 24 hrs (Last 3 readings):  BP- Lying Pulse- Lying BP- Sitting Pulse- Sitting BP- Standing at 3 minutes Pulse- Standing at 3 minutes  12/31/15 0924 131/80 61 128/80 69 128/88 68      Assessment/Plan:  75 year old with new onset seizures and an abnormal eeg. He reports some dizziness on standing and possible irritability with Keppra.  Orthostatic vitals today normal. Decrease the Keppra to 500mg  twice daily, discussed with patient due to dizziness. This is still a therapeutic dose however decreaseing may put him at risk of seizures. He acknowledges risks. If he still is not improved can select another agent. Increase fluids. And see if this helps.  OSA: He is wearing a cpap but takes it off at night. He is being managed by his pcp for sleep apnea.  I don't specialize in sleep but I did discuss with Dr. Rexene Alberts and showed her the print out from his cpap out who said his events per hour are 5.5 and should be < 5, needs to follow up  with Dr. Wolfgang Phoenix for his mask problems, can increase pressure 11cmH2O and stressed compliance, discussed sequelae of untreated OSA.  Seizure: EEG was abnormal; awake and drowsy routine inpatient EEG suggestive of possible epileptogenic potential in the right temporal region. Clinical correlation is recommended . Continue Keppra 500mg  bid. Discussed compliance and that seizures can cause morbidity and mortality.   Patient is unable to drive, operate heavy machinery, perform activities at heights or participate in water activities until 6 months seizure free   90 minute EEG here in the office was normal on Keppra. Continue Keppra 500mg  BID for at least 2 years if not life long.Discussed again today why we need to continue given abnormal eeg.  Discussed Patients with epilepsy have a small risk of sudden unexpected death, a condition referred to as sudden unexpected death in epilepsy (SUDEP). SUDEP is  defined specifically as the sudden, unexpected, witnessed or unwitnessed, nontraumatic and nondrowning death in patients with epilepsy with or without evidence for a seizure, and excluding documented status epilepticus, in which post mortem examination does not reveal a structural or toxicologic cause for death    Sarina Ill, MD  [facility] [address] [place], [place] [address]  Phone [phone] Fax [phone]  A total of 30 minutes was spent face-to-face with this patient. Over half this time was spent on counseling patient on the seizure  diagnosis and different diagnostic and therapeutic options available.

## 2016-01-24 DIAGNOSIS — G4733 Obstructive sleep apnea (adult) (pediatric): Secondary | ICD-10-CM | POA: Diagnosis not present

## 2016-01-24 DIAGNOSIS — M1710 Unilateral primary osteoarthritis, unspecified knee: Secondary | ICD-10-CM | POA: Diagnosis not present

## 2016-01-24 DIAGNOSIS — I10 Essential (primary) hypertension: Secondary | ICD-10-CM | POA: Diagnosis not present

## 2016-02-05 ENCOUNTER — Other Ambulatory Visit: Payer: Self-pay | Admitting: Family Medicine

## 2016-02-12 ENCOUNTER — Ambulatory Visit (INDEPENDENT_AMBULATORY_CARE_PROVIDER_SITE_OTHER): Payer: PPO | Admitting: *Deleted

## 2016-02-12 DIAGNOSIS — Z23 Encounter for immunization: Secondary | ICD-10-CM | POA: Diagnosis not present

## 2016-02-14 DIAGNOSIS — I10 Essential (primary) hypertension: Secondary | ICD-10-CM | POA: Diagnosis not present

## 2016-02-14 DIAGNOSIS — Z125 Encounter for screening for malignant neoplasm of prostate: Secondary | ICD-10-CM | POA: Diagnosis not present

## 2016-02-14 DIAGNOSIS — R7303 Prediabetes: Secondary | ICD-10-CM | POA: Diagnosis not present

## 2016-02-15 LAB — BASIC METABOLIC PANEL
BUN/Creatinine Ratio: 22 (ref 10–24)
BUN: 23 mg/dL (ref 8–27)
CHLORIDE: 98 mmol/L (ref 96–106)
CO2: 24 mmol/L (ref 18–29)
Calcium: 9.1 mg/dL (ref 8.6–10.2)
Creatinine, Ser: 1.04 mg/dL (ref 0.76–1.27)
GFR calc Af Amer: 81 mL/min/{1.73_m2} (ref >59)
GFR calc non Af Amer: 70 mL/min/{1.73_m2} (ref >59)
GLUCOSE: 115 mg/dL — AB (ref 65–99)
POTASSIUM: 4.5 mmol/L (ref 3.5–5.2)
SODIUM: 138 mmol/L (ref 134–144)

## 2016-02-15 LAB — HEMOGLOBIN A1C
Est. average glucose Bld gHb Est-mCnc: 123 mg/dL
HEMOGLOBIN A1C: 5.9 % — AB (ref 4.8–5.6)

## 2016-02-15 LAB — PSA: PROSTATE SPECIFIC AG, SERUM: 0.1 ng/mL (ref 0.0–4.0)

## 2016-03-03 ENCOUNTER — Encounter: Payer: Self-pay | Admitting: Family Medicine

## 2016-03-03 ENCOUNTER — Ambulatory Visit (INDEPENDENT_AMBULATORY_CARE_PROVIDER_SITE_OTHER): Payer: PPO | Admitting: Family Medicine

## 2016-03-03 VITALS — BP 124/78 | Ht 71.0 in | Wt 318.0 lb

## 2016-03-03 DIAGNOSIS — G8929 Other chronic pain: Secondary | ICD-10-CM

## 2016-03-03 DIAGNOSIS — G4733 Obstructive sleep apnea (adult) (pediatric): Secondary | ICD-10-CM | POA: Diagnosis not present

## 2016-03-03 DIAGNOSIS — F1190 Opioid use, unspecified, uncomplicated: Secondary | ICD-10-CM | POA: Diagnosis not present

## 2016-03-03 DIAGNOSIS — E7800 Pure hypercholesterolemia, unspecified: Secondary | ICD-10-CM | POA: Diagnosis not present

## 2016-03-03 DIAGNOSIS — I10 Essential (primary) hypertension: Secondary | ICD-10-CM | POA: Diagnosis not present

## 2016-03-03 DIAGNOSIS — M545 Low back pain, unspecified: Secondary | ICD-10-CM

## 2016-03-03 MED ORDER — ATORVASTATIN CALCIUM 40 MG PO TABS
40.0000 mg | ORAL_TABLET | Freq: Every day | ORAL | 12 refills | Status: DC
Start: 2016-03-03 — End: 2017-03-07

## 2016-03-03 MED ORDER — HYDROMORPHONE HCL 4 MG PO TABS: 4.0000 mg | ORAL_TABLET | Freq: Four times a day (QID) | ORAL | 0 refills | Status: DC | PRN

## 2016-03-03 NOTE — Progress Notes (Signed)
   Subjective:    Patient ID: Park Breed, male    DOB: 04/09/1941, 75 y.o.   MRN: [id number]  HPI This patient was seen today for chronic pain  The medication list was reviewed and updated.   -Compliance with medication: dilaudid  - Number patient states they take daily: 4 a day  -when was the last dose patient took? This am  The patient was advised the importance of maintaining medication and not using illegal substances with these.  Refills needed: yes  The patient was educated that we can provide 3 monthly scripts for their medication, it is their responsibility to follow the instructions.   Side effects or complications from medications: none  Patient is aware that pain medications are meant to minimize the severity of the pain to allow their pain levels to improve to allow for better function. They are aware of that pain medications cannot totally remove their pain.  Due for UDT ( at least once per year) : completed 8/17 Patient has not had any seizures lately Patient keeps his cholesterol under decent control by watching diet closely is best he can takes his medicine previous labs reviewed Takes his blood pressure medicine avoids excessive salt Limited on aerobic exercise because of his health issues Denies rectal bleeding Trying to lose some weight No seizures recently see specialists every 6 months Uses pain medicine keep pain under decent amount to control does not exceed 4 in the day       Review of Systems  Constitutional: Negative for activity change.  Gastrointestinal: Negative for abdominal pain and vomiting.  Neurological: Negative for weakness.  Psychiatric/Behavioral: Negative for confusion.       Objective:   Physical Exam  Constitutional: He appears well-nourished.  Cardiovascular: Normal rate, regular rhythm and normal heart sounds.   No murmur heard. Pulmonary/Chest: Effort normal and breath sounds normal.  Musculoskeletal: He exhibits no  edema.  Lymphadenopathy:    He has no cervical adenopathy.  Neurological: He is alert.  Psychiatric: His behavior is normal.  Vitals reviewed.         Assessment & Plan:  1. Essential hypertension Blood pressure good control continue current measures watch diet avoid excessive salt  2. Chronic bilateral low back pain without sciatica Pain medications necessary's been on this for years. No greater than 4 today 3 scripts were written. Drug registry was checked but his prescriptions had not been registered by the pharmacy  3. Chronic, continuous use of opioids See above. I don't feel patient abuses medicine.  4. Hypercholesteremia Continue current medication previous labs reviewed  5. Obstructive sleep apnea Encouraged to use CPAP on a nightly basis  6. Morbid obesity ([place]) Patient encouraged to try to lose weight as best as possible  Patient to follow-up in 3 months. 25 minutes was spent with the patient. Greater than half the time was spent in discussion and answering questions and counseling regarding the issues that the patient came in for today.

## 2016-05-23 DIAGNOSIS — Z96652 Presence of left artificial knee joint: Secondary | ICD-10-CM | POA: Diagnosis not present

## 2016-05-23 DIAGNOSIS — Z471 Aftercare following joint replacement surgery: Secondary | ICD-10-CM | POA: Diagnosis not present

## 2016-05-23 DIAGNOSIS — Z96651 Presence of right artificial knee joint: Secondary | ICD-10-CM | POA: Diagnosis not present

## 2016-05-23 DIAGNOSIS — Z96653 Presence of artificial knee joint, bilateral: Secondary | ICD-10-CM | POA: Diagnosis not present

## 2016-05-27 DIAGNOSIS — H31001 Unspecified chorioretinal scars, right eye: Secondary | ICD-10-CM | POA: Diagnosis not present

## 2016-05-27 DIAGNOSIS — H524 Presbyopia: Secondary | ICD-10-CM | POA: Diagnosis not present

## 2016-05-27 DIAGNOSIS — H2513 Age-related nuclear cataract, bilateral: Secondary | ICD-10-CM | POA: Diagnosis not present

## 2016-05-27 DIAGNOSIS — H43813 Vitreous degeneration, bilateral: Secondary | ICD-10-CM | POA: Diagnosis not present

## 2016-07-09 ENCOUNTER — Encounter: Payer: Self-pay | Admitting: Family Medicine

## 2016-07-09 NOTE — Telephone Encounter (Signed)
ERROR

## 2016-07-22 ENCOUNTER — Encounter: Payer: Self-pay | Admitting: Family Medicine

## 2016-07-22 ENCOUNTER — Ambulatory Visit (INDEPENDENT_AMBULATORY_CARE_PROVIDER_SITE_OTHER): Payer: PPO | Admitting: Family Medicine

## 2016-07-22 VITALS — BP 118/76 | Ht 71.0 in | Wt 319.2 lb

## 2016-07-22 DIAGNOSIS — F320 Major depressive disorder, single episode, mild: Secondary | ICD-10-CM

## 2016-07-22 DIAGNOSIS — E7800 Pure hypercholesterolemia, unspecified: Secondary | ICD-10-CM | POA: Diagnosis not present

## 2016-07-22 DIAGNOSIS — G8929 Other chronic pain: Secondary | ICD-10-CM

## 2016-07-22 DIAGNOSIS — M545 Low back pain: Secondary | ICD-10-CM

## 2016-07-22 DIAGNOSIS — I10 Essential (primary) hypertension: Secondary | ICD-10-CM | POA: Diagnosis not present

## 2016-07-22 MED ORDER — HYDROMORPHONE HCL 4 MG PO TABS: 4.0000 mg | ORAL_TABLET | Freq: Four times a day (QID) | ORAL | 0 refills | Status: DC | PRN

## 2016-07-22 MED ORDER — DULOXETINE HCL 30 MG PO CPEP: 30.0000 mg | ORAL_CAPSULE | Freq: Every day | ORAL | 3 refills | Status: DC

## 2016-07-22 NOTE — Progress Notes (Signed)
   Subjective:    Patient ID: Steven [name], male    DOB: Feb 02, 1941, 76 y.o.   MRN: [id number]  HPI This patient was seen today for chronic pain  The medication list was reviewed and updated.   -Compliance with medication: Yes, takes daily   - Number patient states they take daily: Takes 3- 4 daily   -when was the last dose patient took? Last dose 0700  The patient was advised the importance of maintaining medication and not using illegal substances with these.  Refills needed: Yes   The patient was educated that we can provide 3 monthly scripts for their medication, it is their responsibility to follow the instructions.  Side effects or complications from medications: None  Patient is aware that pain medications are meant to minimize the severity of the pain to allow their pain levels to improve to allow for better function. They are aware of that pain medications cannot totally remove their pain.  Due for UDT ( at least once per year) :   Patient has concerns of right knee. Patient has concerns of circulation to right leg.  Patient takes blood pressure medicine regular basis He does not watch what he eats has not been trying to lose weight suffers with morbid obesity Chronic low back pain chronic knee pain takes hydromorphone help symptoms Hyperlipidemia takes his med previous labs reviewed Patient does relate feeling somewhat depressed and mildly agitated denies being harmful to herself or to others. Symptoms been present over the past several weeks but to some degree for the past couple months pH Q9 was filled out  Review of Systems  Constitutional: Negative for activity change, appetite change, fatigue and fever.  HENT: Negative for congestion.   Respiratory: Negative for cough and shortness of breath.   Cardiovascular: Negative for chest pain and leg swelling.  Gastrointestinal: Negative for abdominal pain.  Endocrine: Negative for polydipsia and polyphagia.    Neurological: Negative for weakness and headaches.  Psychiatric/Behavioral: Negative for confusion.       Objective:   Physical Exam  Constitutional: He appears well-nourished. No distress.  Cardiovascular: Normal rate, regular rhythm and normal heart sounds.   No murmur heard. Pulmonary/Chest: Effort normal and breath sounds normal. No respiratory distress.  Musculoskeletal: He exhibits no edema.  Lymphadenopathy:    He has no cervical adenopathy.  Neurological: He is alert.  Psychiatric: His behavior is normal.  Vitals reviewed.   25 minutes was spent with the patient. Greater than half the time was spent in discussion and answering questions and counseling regarding the issues that the patient came in for today.       Assessment & Plan:  HTN good control continue current measures Hyperlipidemia continue current measures previous labs reviewed Morbid obesity patient encouraged to lose weight get under 300 pounds Low back pain with mild sciatica to the right leg Right knee pain previous knee replacement followed by orthopedics Chronic pain management does not take more than 4 a day of hydromorphone does not cause drowsiness Mild depression please see questionnaire long discussion held with patient try Cymbalta 30 mg daily Recheck in 4 weeks time

## 2016-08-18 ENCOUNTER — Ambulatory Visit: Payer: PPO | Admitting: Neurology

## 2016-08-19 DIAGNOSIS — E7800 Pure hypercholesterolemia, unspecified: Secondary | ICD-10-CM | POA: Diagnosis not present

## 2016-08-19 DIAGNOSIS — I10 Essential (primary) hypertension: Secondary | ICD-10-CM | POA: Diagnosis not present

## 2016-08-20 LAB — LIPID PANEL
Chol/HDL Ratio: 3.3 ratio (ref 0.0–5.0)
Cholesterol, Total: 124 mg/dL (ref 100–199)
HDL: 38 mg/dL — ABNORMAL LOW (ref >39)
LDL CALC: 69 mg/dL (ref 0–99)
Triglycerides: 87 mg/dL (ref 0–149)
VLDL CHOLESTEROL CAL: 17 mg/dL (ref 5–40)

## 2016-08-20 LAB — BASIC METABOLIC PANEL
BUN / CREAT RATIO: 23 (ref 10–24)
BUN: 25 mg/dL (ref 8–27)
CHLORIDE: 99 mmol/L (ref 96–106)
CO2: 25 mmol/L (ref 18–29)
Calcium: 9 mg/dL (ref 8.6–10.2)
Creatinine, Ser: 1.07 mg/dL (ref 0.76–1.27)
GFR calc non Af Amer: 68 mL/min/{1.73_m2} (ref >59)
GFR, EST AFRICAN AMERICAN: 78 mL/min/{1.73_m2} (ref >59)
Glucose: 115 mg/dL — ABNORMAL HIGH (ref 65–99)
Potassium: 4.5 mmol/L (ref 3.5–5.2)
Sodium: 139 mmol/L (ref 134–144)

## 2016-08-20 LAB — HEPATIC FUNCTION PANEL
ALBUMIN: 4 g/dL (ref 3.5–4.8)
ALK PHOS: 68 IU/L (ref 39–117)
ALT: 19 IU/L (ref 0–44)
AST: 24 IU/L (ref 0–40)
BILIRUBIN, DIRECT: 0.17 mg/dL (ref 0.00–0.40)
Bilirubin Total: 0.5 mg/dL (ref 0.0–1.2)
TOTAL PROTEIN: 6.6 g/dL (ref 6.0–8.5)

## 2016-08-20 LAB — HEMOGLOBIN A1C
ESTIMATED AVERAGE GLUCOSE: 123 mg/dL
HEMOGLOBIN A1C: 5.9 % — AB (ref 4.8–5.6)

## 2016-08-26 ENCOUNTER — Other Ambulatory Visit (INDEPENDENT_AMBULATORY_CARE_PROVIDER_SITE_OTHER): Payer: PPO | Admitting: *Deleted

## 2016-08-26 DIAGNOSIS — K5900 Constipation, unspecified: Secondary | ICD-10-CM

## 2016-08-26 LAB — IFOBT (OCCULT BLOOD): IMMUNOLOGICAL FECAL OCCULT BLOOD TEST: NEGATIVE

## 2016-08-28 ENCOUNTER — Ambulatory Visit (INDEPENDENT_AMBULATORY_CARE_PROVIDER_SITE_OTHER): Payer: PPO | Admitting: Family Medicine

## 2016-08-28 ENCOUNTER — Encounter: Payer: Self-pay | Admitting: Family Medicine

## 2016-08-28 VITALS — BP 122/82 | Ht 71.0 in | Wt 315.4 lb

## 2016-08-28 DIAGNOSIS — G5602 Carpal tunnel syndrome, left upper limb: Secondary | ICD-10-CM | POA: Diagnosis not present

## 2016-08-28 DIAGNOSIS — I10 Essential (primary) hypertension: Secondary | ICD-10-CM | POA: Diagnosis not present

## 2016-08-28 MED ORDER — LISINOPRIL 10 MG PO TABS: 10.0000 mg | ORAL_TABLET | Freq: Every day | ORAL | 5 refills | Status: DC

## 2016-08-28 MED ORDER — OMEPRAZOLE 20 MG PO CPDR: 20.0000 mg | DELAYED_RELEASE_CAPSULE | Freq: Every day | ORAL | 5 refills | Status: DC

## 2016-08-28 MED ORDER — LISINOPRIL-HYDROCHLOROTHIAZIDE 10-12.5 MG PO TABS: ORAL_TABLET | ORAL | 5 refills | Status: DC

## 2016-08-28 NOTE — Progress Notes (Signed)
   Subjective:    Patient ID: Steven [name], male    DOB: 1940-11-20, 76 y.o.   MRN: [id number]  HPIPatient arrives for a follow up on blood pressure and depression. Patient states he is doing ok on the cymbalta Discuss recent lab work He had a comprehensive visit just a few weeks ago He is taking his medications as directed He he states he is not watching his diet He has significant troubles with obesity and over eating He states his moods are doing better he denies being depressed. He feels he is making improvements Review of Systems  Constitutional: Negative for activity change, appetite change and fatigue.  HENT: Negative for congestion.   Respiratory: Negative for cough.   Cardiovascular: Negative for chest pain.  Gastrointestinal: Negative for abdominal pain.  Endocrine: Negative for polydipsia and polyphagia.  Neurological: Negative for weakness.  Psychiatric/Behavioral: Negative for confusion.       Objective:   Physical Exam  Constitutional: He appears well-nourished. No distress.  Cardiovascular: Normal rate, regular rhythm and normal heart sounds.   No murmur heard. Pulmonary/Chest: Effort normal and breath sounds normal. No respiratory distress.  Musculoskeletal: He exhibits no edema.  Lymphadenopathy:    He has no cervical adenopathy.  Neurological: He is alert.  Psychiatric: His behavior is normal.  Vitals reviewed.         Assessment & Plan:  Intermittent left carpal tunnel syndrome-patient prefers not to do anything about currently he will watch it let us know if it gets worse possibly will need further workup by neurology  Elevated blood pressure good control continue current measures  Depression doing some better on Cymbalta continue this he denies being suicidal  Prediabetes A1c looks good continue current measures  Liver function good continue current measures  Blood pressure good metabolic 7 in good  Hyperlipidemia HDL slightly low LDL very  good continue current measures  Moderate obesity patient encouraged to watch portions try to lose weight  Follow-up for regular checkups on his pain medicine

## 2016-09-04 ENCOUNTER — Encounter: Payer: Self-pay | Admitting: Cardiovascular Disease

## 2016-09-11 ENCOUNTER — Ambulatory Visit (INDEPENDENT_AMBULATORY_CARE_PROVIDER_SITE_OTHER): Payer: PPO | Admitting: Neurology

## 2016-09-11 ENCOUNTER — Encounter: Payer: Self-pay | Admitting: Neurology

## 2016-09-11 VITALS — BP 117/80 | HR 67 | Ht 70.5 in | Wt 317.2 lb

## 2016-09-11 DIAGNOSIS — R569 Unspecified convulsions: Secondary | ICD-10-CM | POA: Diagnosis not present

## 2016-09-11 MED ORDER — LEVETIRACETAM ER 500 MG PO TB24: 1000.0000 mg | ORAL_TABLET | Freq: Every day | ORAL | 11 refills | Status: DC

## 2016-09-11 NOTE — Patient Instructions (Signed)
Remember to drink plenty of fluid, eat healthy meals and do not skip any meals. Try to eat protein with a every meal and eat a healthy snack such as fruit or nuts in between meals. Try to keep a regular sleep-wake schedule and try to exercise daily, particularly in the form of walking, 20-30 minutes a day, if you can.   As far as your medications are concerned, I would like to suggest  Change medication to Keppra extended release thousand milligrams (2 pills) at bedtime  As far as diagnostic testing: ambulatory eeg  I would like to see you back in 6 months, sooner if we need to. Please call us with any interim questions, concerns, problems, updates or refill requests.   Our phone number is [phone]. We also have an after hours call service for urgent matters and there is a physician on-call for urgent questions. For any emergencies you know to call 911 or go to the nearest emergency room
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
## 2016-09-12 ENCOUNTER — Telehealth: Payer: Self-pay

## 2016-09-12 NOTE — Telephone Encounter (Signed)
Referral form completed and order signed by MD, faxed to [facility] for 72 hr EMG.

## 2016-09-22 ENCOUNTER — Encounter: Payer: Self-pay | Admitting: Cardiovascular Disease

## 2016-09-22 ENCOUNTER — Ambulatory Visit (INDEPENDENT_AMBULATORY_CARE_PROVIDER_SITE_OTHER): Payer: PPO | Admitting: Cardiovascular Disease

## 2016-09-22 VITALS — BP 132/88 | HR 62 | Ht 70.5 in | Wt 320.8 lb

## 2016-09-22 DIAGNOSIS — I480 Paroxysmal atrial fibrillation: Secondary | ICD-10-CM | POA: Diagnosis not present

## 2016-09-22 MED ORDER — APIXABAN 5 MG PO TABS: 5.0000 mg | ORAL_TABLET | Freq: Two times a day (BID) | ORAL | 6 refills | Status: DC

## 2016-09-22 NOTE — Progress Notes (Signed)
Cardiology Office Note Date:  09/24/2016   ID:  Steven [name], DOB [birth year], MRN [id number]  PCP:  Kathyrn Drown, MD  Cardiologist:  Sherren Mocha, MD    Chief Complaint  Patient presents with  . Follow-up    1 year     History of Present Illness: Steven [name] is a 76 y.o. male who presents for  follow-up of coronary artery disease. The patient has been managed medicallywith an absence of angina. He also has a remote history of postoperative atrial fibrillation without recurrence. Cardiac catheterization in 2011 demonstrated distal branch vessel disease of the right coronary artery, minor nonobstructive stenosis of the LAD, and small vessel occlusion of the distal left circumflex with left to left collaterals. LV function has been normal.  Here with his wife today. Continues to have significant limitation from obesity and osteoarthritis. Complains of shortness of breath. No CP, orthopnea, PND, lightheadedness, or palpitations. He hasn't made any changes in his diet.   Past Medical History:  Diagnosis Date  . Chronic pain   . Complication of anesthesia    pt woke up during last surgery   . Coronary artery disease 02-27-11   Dr. Jacquiline Doe follows-has some blocked coronary arteries ,not suitable for stent  placement  . Dyslipidemia   . Dysrhythmia    hx of atrial fib during last hospitalization   . GERD (gastroesophageal reflux disease) 02-27-11   Acid reflux  . Hearing loss 02-27-11   Bilateral hearing aids-due to exposure to loud machinery  . Hemorrhoids 02-27-11   not bothersome at this time  . Hyperlipidemia   . Hypertension   . Leg swelling   . Obesity   . Osteoarthritis 02-27-11   Ostearthritis-knees, shoulders.Back causes chronic pain-radiates down right leg  . Rash   . Shingles outbreak 02-27-11   2 weeks ago , was tx.-only residual is tenderness of right scalp-no open areas  . Shortness of breath    with exertion   . Urinary incontinence  02-27-11   not an everday occurrence-no special measures    Past Surgical History:  Procedure Laterality Date  . CHOLECYSTECTOMY  03/14/2011   Procedure: LAPAROSCOPIC CHOLECYSTECTOMY;  Surgeon: Edward Jolly, MD;  Location: [facility];  Service: General;  Laterality: N/A;  . COLONOSCOPY    . INJECTION KNEE  03/05/2011   Procedure: KNEE INJECTION;  Surgeon: Gearlean Alf;  Location: [facility];  Service: Orthopedics;  Laterality: Left;  80 mg depomedrol  . LAPAROSCOPY  03/16/2011   Procedure: LAPAROSCOPY DIAGNOSTIC;  Surgeon: Judieth Keens, DO;  Location: [facility];  Service: General;  Laterality: N/A;  repair incisional hernia  . REPLACEMENT TOTAL KNEE  02-27-11   right- 2007  . TOTAL KNEE ARTHROPLASTY  03/01/2012   Procedure: TOTAL KNEE ARTHROPLASTY;  Surgeon: Gearlean Alf, MD;  Location: [facility];  Service: Orthopedics;  Laterality: Left;  . TOTAL KNEE REVISION  03/05/2011   Procedure: TOTAL KNEE REVISION;  Surgeon: Dione Plover Aluisio;  Location: [facility];  Service: Orthopedics;  Laterality: Right;    Current Outpatient Prescriptions  Medication Sig Dispense Refill  . atorvastatin (LIPITOR) 40 MG tablet Take 1 tablet (40 mg total) by mouth daily. 30 tablet 12  . cholecalciferol (VITAMIN D) 1000 UNITS tablet Take 1,000 Units by mouth daily.    . Cyanocobalamin (VITAMIN B-12 PO) Take 1,000 mg by mouth daily.    . DULoxetine (CYMBALTA) 30 MG capsule Take 1 capsule (30 mg total) by mouth daily. [place]  capsule 3  . HYDROmorphone (DILAUDID) 4 MG tablet Take 1 tablet (4 mg total) by mouth every 6 (six) hours as needed. Pain 120 tablet 0  . levETIRAcetam (KEPPRA XR) 500 MG 24 hr tablet Take 2 tablets (1,000 mg total) by mouth at bedtime. 60 tablet 11  . lisinopril (PRINIVIL,ZESTRIL) 10 MG tablet Take 1 tablet (10 mg total) by mouth daily. 30 tablet 5  . Methylcellulose, Laxative, (CITRUCEL) 500 MG TABS Take 1 tablet by mouth daily.     . Omega-3 Fatty Acids (FISH OIL) 1000 MG CAPS Take 1,000 mg by  mouth daily.    Marland Kitchen omeprazole (PRILOSEC) 20 MG capsule Take 1 capsule (20 mg total) by mouth daily. 30 capsule 5  . polyethylene glycol (MIRALAX / GLYCOLAX) packet Take 17 g by mouth daily as needed for mild constipation.    Marland Kitchen apixaban (ELIQUIS) 5 MG TABS tablet Take 1 tablet (5 mg total) by mouth 2 (two) times daily. 60 tablet 6   No current facility-administered medications for this visit.     Allergies:   Ciprofloxacin and Oxycodone-acetaminophen   Social History:  The patient  reports that he has quit smoking. He started smoking about 54 years ago. He quit smokeless tobacco use about 43 years ago. His smokeless tobacco use included Chew. He reports that he does not drink alcohol or use drugs.   Family History:  The patient's  family history includes COPD in his father; Cancer in his father; Heart attack in his father; Heart attack (age of onset: 67) in his sister; Heart disease in his mother; Hypertension in his mother; Other (age of onset: 20) in his mother.    ROS:  Please see the history of present illness.  Otherwise, review of systems is positive for leg swelling, hearing loss, vision changes, abdominal pain, diarrhea, depression, back pain, muscle pain, dizziness, excessive sweating, excessive fatigue, leg pain.  All other systems are reviewed and negative.   PHYSICAL EXAM: VS:  BP 132/88   Pulse 62   Ht 5' 10.5" (1.791 m)   Wt (!) 320 lb 12.8 oz (145.5 kg)   BMI 45.38 kg/m  , BMI Body mass index is 45.38 kg/m. GEN: Well nourished, well developed, pleasant obese male in no acute distress  HEENT: normal  Neck: no JVD, no masses. No carotid bruits Cardiac: irregularly irregular without murmur or gallop    Respiratory:  clear to auscultation bilaterally, normal work of breathing GI: soft, nontender, nondistended, + BS MS: no deformity or atrophy  Ext: no pretibial edema, pedal pulses 2+= bilaterally Skin: warm and dry, no rash Neuro:  Strength and sensation are intact Psych:  euthymic mood, full affect  EKG:  EKG is ordered today. The ekg ordered today shows atrial fibrillation 62 bpm, otherwise within normal limits  Recent Labs: 08/19/2016: ALT 19; BUN 25; Creatinine, Ser 1.07; Potassium 4.5; Sodium 139   Lipid Panel     Component Value Date/Time   CHOL 124 08/19/2016 0807   TRIG 87 08/19/2016 0807   HDL 38 (L) 08/19/2016 0807   CHOLHDL 3.3 08/19/2016 0807   CHOLHDL 3.4 07/20/2015 0435   VLDL 19 07/20/2015 0435   LDLCALC 69 08/19/2016 0807      Wt Readings from Last 3 Encounters:  09/22/16 (!) 320 lb 12.8 oz (145.5 kg)  09/11/16 (!) 317 lb 3.2 oz (143.9 kg)  08/28/16 (!) 315 lb 6.4 oz (143.1 kg)     ASSESSMENT AND PLAN: 1.  CAD, native vessel, without angina:  stable on current Rx. Medications reviewed and will be continued.   2. Hyperlipidemia: The patient is treated with atorvastatin. Recent lipids show an LDL of 69, HDL 38, total cholesterol 124, and triglycerides 87  3. Hypertension: Blood pressure is controlled on lisinopril.  4. Morbid obesity with BMI greater than 40. Extensive counseling done.   5. Atrial fibrillation: pt asymptomatic but noted to be in atrial fibrillation today. Discussed etiologies and treatment considerations including rate versus rhythm control and anticoagulation. He will start Eliquis 5 mg BID. Bleeding risks reviewed with patient. Heart rate is well controlled and no need for medication adjustment. Will check 2D echo to evaluate LV function and atrial size.   Current medicines are reviewed with the patient today.  The patient does not have concerns regarding medicines.  Labs/ tests ordered today include:   Orders Placed This Encounter  Procedures  . Basic Metabolic Panel (BMET)  . CBC  . EKG 12-Lead  . ECHOCARDIOGRAM COMPLETE    Disposition:   FU 6 months  Signed, Sherren Mocha, MD  09/24/2016 11:06 PM    [facility] [place], [place],   [address] Phone: [phone]; Fax: [phone]

## 2016-09-22 NOTE — Patient Instructions (Addendum)
Medication Instructions:  Your physician has recommended you make the following change in your medication:  1. STOP Aspirin 2. START Eliquis 5mg  take one tablet by mouth twice a day  Labwork: Your physician recommends that you return for lab work in 6 MONTHS: BMP and CBC (written orders given to the pt to have lab drawn at PCP office)   Testing/Procedures: Your physician has requested that you have an echocardiogram. Echocardiography is a painless test that uses sound waves to create images of your heart. It provides your doctor with information about the size and shape of your heart and how well your heart's chambers and valves are working. This procedure takes approximately one hour. There are no restrictions for this procedure.  Follow-Up: Your physician wants you to follow-up in: 6 MONTHS with Dr Burt Knack. You will receive a reminder letter in the mail two months in advance. If you don't receive a letter, please call our office to schedule the follow-up appointment.   Any Other Special Instructions Will Be Listed Below (If Applicable).     If you need a refill on your cardiac medications before your next appointment, please call your pharmacy.

## 2016-10-06 ENCOUNTER — Ambulatory Visit (HOSPITAL_COMMUNITY): Payer: PPO | Attending: Internal Medicine

## 2016-10-06 ENCOUNTER — Other Ambulatory Visit: Payer: Self-pay

## 2016-10-06 DIAGNOSIS — I081 Rheumatic disorders of both mitral and tricuspid valves: Secondary | ICD-10-CM | POA: Diagnosis not present

## 2016-10-06 DIAGNOSIS — I2510 Atherosclerotic heart disease of native coronary artery without angina pectoris: Secondary | ICD-10-CM | POA: Diagnosis not present

## 2016-10-06 DIAGNOSIS — Z87891 Personal history of nicotine dependence: Secondary | ICD-10-CM | POA: Insufficient documentation

## 2016-10-06 DIAGNOSIS — E785 Hyperlipidemia, unspecified: Secondary | ICD-10-CM | POA: Insufficient documentation

## 2016-10-06 DIAGNOSIS — R7303 Prediabetes: Secondary | ICD-10-CM | POA: Insufficient documentation

## 2016-10-06 DIAGNOSIS — I4891 Unspecified atrial fibrillation: Secondary | ICD-10-CM | POA: Diagnosis not present

## 2016-10-06 DIAGNOSIS — I10 Essential (primary) hypertension: Secondary | ICD-10-CM | POA: Diagnosis not present

## 2016-10-06 DIAGNOSIS — D649 Anemia, unspecified: Secondary | ICD-10-CM | POA: Diagnosis not present

## 2016-10-06 DIAGNOSIS — G4733 Obstructive sleep apnea (adult) (pediatric): Secondary | ICD-10-CM | POA: Insufficient documentation

## 2016-10-06 DIAGNOSIS — R0600 Dyspnea, unspecified: Secondary | ICD-10-CM | POA: Diagnosis not present

## 2016-10-06 DIAGNOSIS — I480 Paroxysmal atrial fibrillation: Secondary | ICD-10-CM | POA: Diagnosis not present

## 2016-10-07 ENCOUNTER — Telehealth: Payer: Self-pay | Admitting: *Deleted

## 2016-10-07 LAB — ECHOCARDIOGRAM COMPLETE
AV Mean grad: 7 mmHg
AV Peak grad: 12 mmHg
AV area mean vel ind: 0.87 cm2/m2
AV peak Index: 0.89
AV vel: 2.37
AVA: 2.37 cm2
AVAREAMEANV: 2.23 cm2
AVAREAVTI: 2.29 cm2
AVAREAVTIIND: 0.92 cm2/m2
AVCELMEANRAT: 0.65
AVPKVEL: 171 cm/s
Ao pk vel: 0.66 m/s
Ao-asc: 42 cm
CHL CUP AV VALUE AREA INDEX: 0.92
CHL CUP DOP CALC LVOT VTI: 23.9 cm
CHL CUP TV REG PEAK VELOCITY: 252 cm/s
DOP CAL AO MEAN VELOCITY: 123 cm/s
FS: 41 % (ref 28–44)
IV/PV OW: 0.92
LA ID, A-P, ES: 61 mm
LA diam end sys: 61 mm
LA diam index: 2.37 cm/m2
LA vol: 54.7 mL
LAVOLA4C: 68.7 mL
LAVOLIN: 21.3 mL/m2
LDCA: 3.46 cm2
LV PW d: 12 mm — AB (ref 0.6–1.1)
LVOT SV: 83 mL
LVOT peak VTI: 0.68 cm
LVOT peak grad rest: 5 mmHg
LVOTD: 21 mm
LVOTPV: 113 cm/s
Lateral S' vel: 10 cm/s
RV sys press: 28 mmHg
TAPSE: 23.4 mm
TRMAXVEL: 252 cm/s
VTI: 34.9 cm

## 2016-10-07 NOTE — Telephone Encounter (Signed)
Patient informed. 

## 2016-10-07 NOTE — Telephone Encounter (Signed)
-----   Message from Sherren Mocha, MD sent at 10/07/2016  6:26 AM EDT ----- Normal findings noted on echo. No further cardiac testing indicated. thanks

## 2016-10-14 ENCOUNTER — Other Ambulatory Visit: Payer: Self-pay | Admitting: Family Medicine

## 2016-11-04 ENCOUNTER — Ambulatory Visit (INDEPENDENT_AMBULATORY_CARE_PROVIDER_SITE_OTHER): Payer: PPO | Admitting: Family Medicine

## 2016-11-04 ENCOUNTER — Encounter: Payer: Self-pay | Admitting: Family Medicine

## 2016-11-04 VITALS — BP 126/88 | Ht 71.0 in | Wt 312.0 lb

## 2016-11-04 DIAGNOSIS — R5383 Other fatigue: Secondary | ICD-10-CM

## 2016-11-04 DIAGNOSIS — R21 Rash and other nonspecific skin eruption: Secondary | ICD-10-CM | POA: Diagnosis not present

## 2016-11-04 DIAGNOSIS — T148XXA Other injury of unspecified body region, initial encounter: Secondary | ICD-10-CM

## 2016-11-04 MED ORDER — HYDROMORPHONE HCL 2 MG PO TABS: ORAL_TABLET | ORAL | 0 refills | Status: DC

## 2016-11-04 NOTE — Progress Notes (Signed)
   Subjective:    Patient ID: Steven [name], male    DOB: 11/03/1940, 76 y.o.   MRN: [id number]  Rash  This is a new problem. The current episode started more than 1 month ago. The problem has been waxing and waning since onset.   States he was just started on eliquis by cardiologist two months ago. States it makes him sleepy and he is breaking out in a rash that gets better then breaks back out again. Having some dizziness and sweating.  Needs refill on opiates Review of Systems  Skin: Positive for rash.  25 minutes was spent with the patient. Greater than half the time was spent in discussion and answering questions and counseling regarding the issues that the patient came in for today.  Denies chest tightness denies fast heart rate denies wheezing difficulty breathing    Objective:   Physical Exam Lungs are clear hearts irregular pulse normal BP is good extremities no edema skin mottled rash noted on his sides not ecchymosis he does have bruising on the right arm  He does relate a significant amount of fatigue his wife states he falls asleep during the day in addition to this the patient relates his energy level is not as good as it has been. He denies being depressed. Is on multiple medicines is hoping to come off of Keppra     Assessment & Plan:  Hard to pinpoint why he is having this rash I do not feel it is due to eliquis  Drowsiness fatigue during the day this could be a multifactorial problem related to atrial fib medications, age, sleep apnea, obesity patient was encouraged to make sure he is using sleep apnea machine properly in addition to this he was encouraged to try to titrate down on pain medicine  It is possible pain medicine could be causing some of his drowsiness long discussion with him and his wife regarding this I recommend using the 2 mg tablets he may take 2 pills every 6 hours as needed but he is challenged to try to keep his pain medication at 7 tablets a day or  less if he is successful with this in a few weeks time he can reduce it to 6 tablets-so on and so forth reducing the amount of medicine he is on  Atrial fibrillation it is in his best interest to be on blood thinner to reduce her risk of stroke  Steven [name] is not had any seizures in over one year he saw neurologist in May who recommended EEG but apparently there is been communication issues between their office and him getting this set up we will message neurology regarding this

## 2016-11-06 LAB — CBC WITH DIFFERENTIAL/PLATELET
BASOS: 0 %
Basophils Absolute: 0 10*3/uL (ref 0.0–0.2)
EOS (ABSOLUTE): 0.2 10*3/uL (ref 0.0–0.4)
Eos: 2 %
HEMOGLOBIN: 15.4 g/dL (ref 13.0–17.7)
Hematocrit: 45.5 % (ref 37.5–51.0)
IMMATURE GRANS (ABS): 0 10*3/uL (ref 0.0–0.1)
Immature Granulocytes: 0 %
LYMPHS ABS: 1.6 10*3/uL (ref 0.7–3.1)
LYMPHS: 22 %
MCH: 31.6 pg (ref 26.6–33.0)
MCHC: 33.8 g/dL (ref 31.5–35.7)
MCV: 93 fL (ref 79–97)
Monocytes Absolute: 0.6 10*3/uL (ref 0.1–0.9)
Monocytes: 8 %
NEUTROS ABS: 4.8 10*3/uL (ref 1.4–7.0)
Neutrophils: 68 %
PLATELETS: 225 10*3/uL (ref 150–379)
RBC: 4.88 x10E6/uL (ref 4.14–5.80)
RDW: 12.7 % (ref 12.3–15.4)
WBC: 7.1 10*3/uL (ref 3.4–10.8)

## 2016-11-06 LAB — FERRITIN: Ferritin: 92 ng/mL (ref 30–400)

## 2016-11-06 LAB — TSH: TSH: 2.17 u[IU]/mL (ref 0.450–4.500)

## 2016-11-11 NOTE — Addendum Note (Signed)
Addended by: Dairl Ponder on: 11/11/2016 11:31 AM   Modules accepted: Orders

## 2016-11-14 ENCOUNTER — Telehealth: Payer: Self-pay

## 2016-11-14 ENCOUNTER — Other Ambulatory Visit: Payer: Self-pay | Admitting: Neurology

## 2016-11-14 NOTE — Telephone Encounter (Signed)
Returned call to pt's wife. Sooner appt for 75 min EEG scheduled 11/26/16.

## 2016-11-14 NOTE — Telephone Encounter (Signed)
If this eeg is negative we can immediately start weaning off of medications. Please try to find an eeg for next for 60-90 minutes

## 2016-11-14 NOTE — Telephone Encounter (Signed)
Called and spoke to pt's wife. Pt was not ever been set up for extended EEG. Wife reports that she and her husband both left [facility] and spoke to someone at [facility] a couple of times but he was still not scheduled for procedure. Therefore, an EEG has been scheduled in our office at the end of this month. Pt/wife would like to discuss weaning off of his seizure medications, if possible. Pt is on several different meds and has been having issues w/ possible side effects/interactions, ongoing rash.

## 2016-11-14 NOTE — Telephone Encounter (Signed)
If the EEG is normal this does not rule out epilepsy. I have spoken to them about this but please reiterate if the eeg is normal it does not mean he will not have another seizure off of medications. Appears insurance is declining a longer ambulatory EEG. If the extended eeg in the office is normal he can reduce his Keppra to 500mg  (one pill) once a day for 4 weeks then stop. I would recommend that he does not drive for 3-6 months after stopping keppra just to be sure. thanks

## 2016-11-17 NOTE — Telephone Encounter (Signed)
Pt is scheduled for EEG 11/26/16.

## 2016-11-25 ENCOUNTER — Telehealth: Payer: Self-pay | Admitting: Cardiovascular Disease

## 2016-11-25 NOTE — Telephone Encounter (Signed)
Seems unlikely that this rash is related to Eliquis given it gets worse in sun/heat. Dizziness is also not commonly associated with Eliquis thus I would also think this is not related to Eliquis. Would wait to see what neurology thinks about dizziness. Could consider change to another DOAC (Xarelto or Pradaxa) if feel this is related to medication, though I am not convinced these symptoms are cause by Eliquis.

## 2016-11-25 NOTE — Telephone Encounter (Signed)
Per wife, since patient started taking eliquis he having a rash on both sides of abdomen that goes up to his shoulders that has started itching. The rash gets worse after going out into the sun to work in the garden or cut grass per wife. Patient has a h/o headache and dizziness but it seems to have returned since starting eliquis per wife. BP has not been checked when patient feels dizzy or have h/a. Per wife, patient is going to the neurologist tomorrow for a f/u visit since he is taking keprra for his seizures. No c/o fever, chest pain or sob. Per wife, patient saw his PCP about the rash this week and his PCP was supposed to get in touch with Dr. Burt Knack about it. Per wife, patient  has been referred to a dermatologist about the rash and will see dermatologist next Tuesday. Per wife, patient has not started any new meds, new foods, detergents or soaps. Wife advised that message would be sent to the pharmacist for advice.

## 2016-11-25 NOTE — Telephone Encounter (Signed)
New Message    Pt c/o medication issue:  1. Name of Medication:  eliquis   2. How are you currently taking this medication (dosage and times per day)?  2x a  day  3. Are you having a reaction (difficulty breathing--STAT) not sure if it is worse or not, having severe headaches and broken out in rash   4. What is your medication issue?  Having what the wife believes is a  Allergic reaction

## 2016-11-26 ENCOUNTER — Ambulatory Visit (INDEPENDENT_AMBULATORY_CARE_PROVIDER_SITE_OTHER): Payer: PPO | Admitting: Neurology

## 2016-11-26 DIAGNOSIS — R569 Unspecified convulsions: Secondary | ICD-10-CM | POA: Diagnosis not present

## 2016-11-26 NOTE — Procedures (Signed)
    History:  Steven [name] is a 76 year old gentleman with a history of an event of jerking on all 4 extremities occurring on 07/19/2015. The patient has been treated with Keppra. In the past, he had abnormal EEG evaluation with right temporal irritability. The patient being reevaluated at this time.  This is a prolonged EEG. No skull defects are noted. Medications include aspirin, Lipitor, vitamin D, vitamin B12, Cymbalta, Dilaudid, Keppra, lisinopril, fish oil, Prilosec, and MiraLAX.   EEG classification: Normal awake  Description of the recording: The background rhythms of this recording consists of a fairly well modulated medium amplitude alpha rhythm of 9 Hz that is reactive to eye opening and closure. As the record progresses, the patient appears to remain in the waking state throughout the recording. Photic stimulation was performed, resulting in a bilateral and symmetric photic driving response. Hyperventilation was also performed, resulting in a minimal buildup of the background rhythm activities without significant slowing seen. At no time during the recording does there appear to be evidence of spike or spike wave discharges or evidence of focal slowing. EKG monitor shows no evidence of cardiac rhythm abnormalities with a heart rate of 66.  Impression: This is a normal EEG recording in the waking state. No evidence of ictal or interictal discharges are seen.

## 2016-11-27 NOTE — Telephone Encounter (Signed)
Patient's wife informed and verbalized understanding.  Per wife, patient will be seeing a dermatologist next week.

## 2016-12-02 DIAGNOSIS — L270 Generalized skin eruption due to drugs and medicaments taken internally: Secondary | ICD-10-CM | POA: Diagnosis not present

## 2016-12-04 ENCOUNTER — Telehealth: Payer: Self-pay | Admitting: Neurology

## 2016-12-04 NOTE — Telephone Encounter (Signed)
I called and left a message, the EEG study was normal.

## 2016-12-04 NOTE — Telephone Encounter (Signed)
Patients wife Steven [name] (listed on DPR) called office requesting EEG results.  Please call

## 2016-12-08 ENCOUNTER — Ambulatory Visit (INDEPENDENT_AMBULATORY_CARE_PROVIDER_SITE_OTHER): Payer: PPO | Admitting: Family Medicine

## 2016-12-08 ENCOUNTER — Encounter: Payer: Self-pay | Admitting: Family Medicine

## 2016-12-08 VITALS — BP 132/88 | Ht 71.0 in | Wt 316.5 lb

## 2016-12-08 DIAGNOSIS — R21 Rash and other nonspecific skin eruption: Secondary | ICD-10-CM | POA: Diagnosis not present

## 2016-12-08 DIAGNOSIS — G8929 Other chronic pain: Secondary | ICD-10-CM

## 2016-12-08 DIAGNOSIS — R5383 Other fatigue: Secondary | ICD-10-CM

## 2016-12-08 DIAGNOSIS — M545 Low back pain: Secondary | ICD-10-CM | POA: Diagnosis not present

## 2016-12-08 DIAGNOSIS — F1190 Opioid use, unspecified, uncomplicated: Secondary | ICD-10-CM | POA: Diagnosis not present

## 2016-12-08 MED ORDER — ZOSTER VAC RECOMB ADJUVANTED 50 MCG/0.5ML IM SUSR: 0.5000 mL | Freq: Once | INTRAMUSCULAR | 1 refills | Status: AC

## 2016-12-08 MED ORDER — HYDROMORPHONE HCL 2 MG PO TABS: ORAL_TABLET | ORAL | 0 refills | Status: DC

## 2016-12-08 NOTE — Progress Notes (Signed)
Subjective:    Patient ID: Steven [name], male    DOB: Nov 21, 1940, 76 y.o.   MRN: [id number]  HPI Patient in today for a 3 week follow up on fatigue and rash. Patient's states that he still feels really fatigued and rash is still present. Has seen Dermatologist since last visit.   Patient would also like a refill on pain medication.  Patient currently taking pain medicine 2 in the morning 2 in the afternoon to an evening. I discussed with him the pharmacokinetics of this heat states it's not causing him drowsiness  He does have moderate amount of fatigue he does uses CPAP machine on a regular basis he states. 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 He denies somnolence with it denies passing out with it. His been unable to tolerate other medications.  He has nonspecific rash on his sides it gets worse when he Nicki Reaper it started after he started on the medication Eliquis he feels that the medication is causing this he saw dermatologist that stated the rash was nonspecific in stated that the only way to know if the medication was the cause would be to cycle off the medicine   Review of Systems  Constitutional: Negative for activity change, fatigue and fever.  Respiratory: Negative for cough and shortness of breath.   Cardiovascular: Negative for chest pain and leg swelling.  Gastrointestinal: Negative for abdominal pain and vomiting.  Neurological: Negative for weakness and headaches.  Psychiatric/Behavioral: Negative for confusion.       Objective:   Physical Exam  Constitutional: He appears well-nourished. No distress.  Cardiovascular: Normal rate, regular rhythm and normal heart sounds.   No murmur heard. Pulmonary/Chest: Effort normal and breath sounds normal. No respiratory distress.  Musculoskeletal: He exhibits no edema.    Lymphadenopathy:    He has no cervical adenopathy.  Neurological: He is alert.  Psychiatric: His behavior is normal.  Vitals reviewed.   25 minutes was spent with the patient. Greater than half the time was spent in discussion and answering questions and counseling regarding the issues that the patient came in for today.       Assessment & Plan:  Chronic pain-I believe the patient would do better if he took his medication more spaced out. Currently right now he is only taking it 3 times a day. I suggested to him to use 2 in the morning one midmorning 1 early afternoon in 1 early evening this would be a total of 5 per day which would be an improvement over his current 6 a day. 3 prescriptions were given he is to follow-up in 3 months. If he has trouble he will let us now  Ongoing rash-he saw dermatology. They feel that it is possible this could be related to his heart medicine-Eliquis because the rash started when he started this medicine. We will send a noticed to his cardiologist to see if they are willing to try a different anticoagulant to see if this rash would go away (obviously if they changed anticoagulant in the rash does not go away it is not related to the medicine)  Patient recently had EEG. He was told it was normal. He states he was hoping the neurologist would be willing to reduce his seizure medicine because of fatigue and tiredness he experiences. We will send  a copy to his neurologist to see if there willing to make an adjustment-I did encourage the family to follow-up with neurology and to call them.  Sleep apnea apparently he does a good job using it most evenings but he does not know how often he uses it in terms of how many hours per night we will try to get a read out from [facility]  Recent lab work regarding fatigue looked good no further testing at this time

## 2016-12-09 NOTE — Progress Notes (Signed)
[facility] to send printout of patient's CPAP usage

## 2016-12-10 ENCOUNTER — Other Ambulatory Visit: Payer: Self-pay

## 2016-12-12 ENCOUNTER — Other Ambulatory Visit: Payer: Self-pay | Admitting: Family Medicine

## 2016-12-25 ENCOUNTER — Telehealth: Payer: Self-pay | Admitting: *Deleted

## 2016-12-25 NOTE — Telephone Encounter (Signed)
Pt's wife returned RN's call °

## 2016-12-25 NOTE — Telephone Encounter (Signed)
Pts wife called back.  Had EEG which was normal 11-26-16.  She states that the keppra 1000mg  at qhs that he is taking is still causing him to feel bad.  He is sleepy, does not want to do anything.  Now on  New med: eliquis from [facility]. His sz was from 1.5 yrs ago.  No sz since.  Note from 11-14-16 notes if eeg normal, possibly decrease keppra to 500mg  daily for 4 wks  and then stop.  Please advise.

## 2016-12-25 NOTE — Telephone Encounter (Signed)
Tell patient to decrease to keppra to 500mg  daily for 4 weeks. If he does not have any repeat seizures and he can stop the medication. Again, discussed that patient had an unprovoked seizure with an abnormal EEG and the risks of coming off of seizure medications include having more seizures. I had a lengthy discussion with patient and wife at last appointment. They understand the risks. They still would like to stop the Keppra medication.

## 2016-12-25 NOTE — Telephone Encounter (Signed)
LMVM for pt to return call.  Following up on him tapering off sz medication.

## 2016-12-25 NOTE — Telephone Encounter (Signed)
I spoke to pts wife and relayed risks of coming off medication that can have sz. She and pt understand the risk.  Will taper Keppra to 500mg  po qhs for 4 wks and then stop if no sz.  She understands.  Will keep 03/13/17 appt with CM/NP as scheduled.

## 2016-12-25 NOTE — Telephone Encounter (Signed)
Busy, will try again later.

## 2016-12-25 NOTE — Telephone Encounter (Signed)
-----   Message from Melvenia Beam, MD sent at 12/20/2016  3:31 PM EDT ----- Katharine Look, we can taper off of his seizure meds. Would you call and see where patient is with that? Thanks  ----- Message ----- From: Kathyrn Drown, MD Sent: 12/08/2016  12:40 PM To: Melvenia Beam, MD  Patient recently had EEG-normal-he is interested in tapering off of his seizure med. I encouraged him to call your office for follow-up. Thank you

## 2016-12-26 ENCOUNTER — Telehealth: Payer: Self-pay

## 2016-12-26 MED ORDER — DABIGATRAN ETEXILATE MESYLATE 150 MG PO CAPS: 150.0000 mg | ORAL_CAPSULE | Freq: Two times a day (BID) | ORAL | 3 refills | Status: DC

## 2016-12-26 NOTE — Telephone Encounter (Signed)
Received Dermatology note for Dr. Burt Knack to review. Confirmed patient still has rash and it is worst when he is outside. Per Dr. Burt Knack, instructed patient (and his wife) to: 1) STOP ELIQUIS 2) START PRADAXA 150 mg BID They will call if symptoms worsen or do not improve. They were grateful for call and agree with treatment plan.

## 2017-01-14 ENCOUNTER — Other Ambulatory Visit: Payer: Self-pay | Admitting: Family Medicine

## 2017-02-09 ENCOUNTER — Other Ambulatory Visit: Payer: Self-pay | Admitting: Family Medicine

## 2017-02-14 ENCOUNTER — Other Ambulatory Visit: Payer: Self-pay | Admitting: Family Medicine

## 2017-02-26 ENCOUNTER — Telehealth: Payer: Self-pay | Admitting: Family Medicine

## 2017-02-26 DIAGNOSIS — I10 Essential (primary) hypertension: Secondary | ICD-10-CM

## 2017-02-26 DIAGNOSIS — E785 Hyperlipidemia, unspecified: Secondary | ICD-10-CM

## 2017-02-26 DIAGNOSIS — R739 Hyperglycemia, unspecified: Secondary | ICD-10-CM

## 2017-02-26 DIAGNOSIS — Z125 Encounter for screening for malignant neoplasm of prostate: Secondary | ICD-10-CM

## 2017-02-26 NOTE — Telephone Encounter (Signed)
Spoke with patient and informed him per Dr.Scott Luking- Labs have been ordered. Patient verbalized understanding.  

## 2017-02-26 NOTE — Telephone Encounter (Signed)
Patient has 3 month follow up on 11/30 and wondering if he needed labs done before appointment.

## 2017-02-26 NOTE — Telephone Encounter (Signed)
Metabolic 7, lipid, liver, hemoglobin A1c, PSA-hyperlipidemia fasting hyperglycemia, screening for prostate cancer

## 2017-03-02 DIAGNOSIS — I10 Essential (primary) hypertension: Secondary | ICD-10-CM | POA: Diagnosis not present

## 2017-03-02 DIAGNOSIS — Z125 Encounter for screening for malignant neoplasm of prostate: Secondary | ICD-10-CM | POA: Diagnosis not present

## 2017-03-02 DIAGNOSIS — R739 Hyperglycemia, unspecified: Secondary | ICD-10-CM | POA: Diagnosis not present

## 2017-03-02 DIAGNOSIS — E785 Hyperlipidemia, unspecified: Secondary | ICD-10-CM | POA: Diagnosis not present

## 2017-03-03 LAB — LIPID PANEL
Chol/HDL Ratio: 3.1 ratio (ref 0.0–5.0)
Cholesterol, Total: 129 mg/dL (ref 100–199)
HDL: 41 mg/dL (ref >39)
LDL Calculated: 68 mg/dL (ref 0–99)
Triglycerides: 101 mg/dL (ref 0–149)
VLDL CHOLESTEROL CAL: 20 mg/dL (ref 5–40)

## 2017-03-03 LAB — HEPATIC FUNCTION PANEL
ALBUMIN: 4 g/dL (ref 3.5–4.8)
ALK PHOS: 77 IU/L (ref 39–117)
ALT: 15 IU/L (ref 0–44)
AST: 22 IU/L (ref 0–40)
BILIRUBIN TOTAL: 0.5 mg/dL (ref 0.0–1.2)
Bilirubin, Direct: 0.12 mg/dL (ref 0.00–0.40)
TOTAL PROTEIN: 6.6 g/dL (ref 6.0–8.5)

## 2017-03-03 LAB — BASIC METABOLIC PANEL
BUN/Creatinine Ratio: 19 (ref 10–24)
BUN: 20 mg/dL (ref 8–27)
CALCIUM: 9.3 mg/dL (ref 8.6–10.2)
CO2: 24 mmol/L (ref 20–29)
CREATININE: 1.03 mg/dL (ref 0.76–1.27)
Chloride: 101 mmol/L (ref 96–106)
GFR calc Af Amer: 82 mL/min/{1.73_m2} (ref >59)
GFR, EST NON AFRICAN AMERICAN: 71 mL/min/{1.73_m2} (ref >59)
GLUCOSE: 117 mg/dL — AB (ref 65–99)
Potassium: 4.9 mmol/L (ref 3.5–5.2)
Sodium: 141 mmol/L (ref 134–144)

## 2017-03-03 LAB — HEMOGLOBIN A1C
ESTIMATED AVERAGE GLUCOSE: 126 mg/dL
Hgb A1c MFr Bld: 6 % — ABNORMAL HIGH (ref 4.8–5.6)

## 2017-03-03 LAB — PSA: Prostate Specific Ag, Serum: 0.2 ng/mL (ref 0.0–4.0)

## 2017-03-07 ENCOUNTER — Other Ambulatory Visit: Payer: Self-pay | Admitting: Family Medicine

## 2017-03-12 ENCOUNTER — Encounter: Payer: Self-pay | Admitting: Family Medicine

## 2017-03-12 ENCOUNTER — Ambulatory Visit: Payer: PPO | Admitting: Family Medicine

## 2017-03-12 VITALS — BP 132/82 | Ht 71.0 in | Wt 321.2 lb

## 2017-03-12 DIAGNOSIS — M25569 Pain in unspecified knee: Secondary | ICD-10-CM

## 2017-03-12 DIAGNOSIS — G8929 Other chronic pain: Secondary | ICD-10-CM

## 2017-03-12 DIAGNOSIS — R7303 Prediabetes: Secondary | ICD-10-CM

## 2017-03-12 DIAGNOSIS — M545 Low back pain: Secondary | ICD-10-CM | POA: Diagnosis not present

## 2017-03-12 DIAGNOSIS — I10 Essential (primary) hypertension: Secondary | ICD-10-CM

## 2017-03-12 DIAGNOSIS — M170 Bilateral primary osteoarthritis of knee: Secondary | ICD-10-CM

## 2017-03-12 DIAGNOSIS — E7800 Pure hypercholesterolemia, unspecified: Secondary | ICD-10-CM

## 2017-03-12 MED ORDER — HYDROMORPHONE HCL 2 MG PO TABS: ORAL_TABLET | ORAL | 0 refills | Status: DC

## 2017-03-12 NOTE — Progress Notes (Signed)
   Subjective:    Patient ID: Steven [name], male    DOB: 03-26-1941, 76 y.o.   MRN: [id number]  HPI  This patient was seen today for chronic pain  The medication list was reviewed and updated.   -Compliance with medication: yes  - Number patient states they take daily: 6 a day  -when was the last dose patient took? yes  The patient was advised the importance of maintaining medication and not using illegal substances with these.  Refills needed: yes  The patient was educated that we can provide 3 monthly scripts for their medication, it is their responsibility to follow the instructions.  Side effects or complications from medications: none  Patient is aware that pain medications are meant to minimize the severity of the pain to allow their pain levels to improve to allow for better function. They are aware of that pain medications cannot totally remove their pain.  Due for UDT ( at least once per year) : He will be due on his next visit  He states he takes his pain medicine up to 6 a day denies it causing drowsiness it does help him function  He is not had any seizures since stopping Keppra he follows up with neurology soon  Takes his blood pressure medicine regular basis tries to watch what he eats has not been doing much to actively lose weight has not been exercising he understands the importance of trying to lose weight he states is just difficult  He does have prediabetes he tries to watch starches in his diet he did have lab work all of his lab work was reviewed with patient including A1c of 6.0 PSA normal cholesterol profile good     Review of Systems  Constitutional: Negative for activity change.  Gastrointestinal: Negative for abdominal pain and vomiting.  Neurological: Negative for weakness.  Psychiatric/Behavioral: Negative for confusion.       Objective:   Physical Exam  Constitutional: He appears well-nourished.  Cardiovascular: Normal rate, regular rhythm  and normal heart sounds.  No murmur heard. Pulmonary/Chest: Effort normal and breath sounds normal.  Musculoskeletal: He exhibits no edema.  Lymphadenopathy:    He has no cervical adenopathy.  Neurological: He is alert.  Psychiatric: His behavior is normal.  Vitals reviewed.  Prostate exam normal No swelling in the legs  25 minutes was spent with the patient. Greater than half the time was spent in discussion and answering questions and counseling regarding the issues that the patient came in for today.      Assessment & Plan:  Hyperlipidemia lab work reviewed with patient continue current medication.  Recent rash resolved with switching from Eliquis to Pradaxa  Chronic pain in his back as well as his knees patient was encouraged to do some mild walking on a regular basis healthy eating and trying to lose weight patient was also instructed that his pain medicine should not be more than 6 tablets a day.  Drug registry was checked.  3 prescriptions written.  Follow-up again in 3 months-he states medication does not cause drowsiness  Prostate screening negative  Blood pressure good control continue current measures  Patient denies being depressed

## 2017-03-12 NOTE — Progress Notes (Signed)
[facility]  PATIENT: Steven [name] DOB: 02/16/1941   REASON FOR VISIT: Follow-up for seizure disorder HISTORY FROM:patient and wife    HISTORY OF PRESENT ILLNESS:UPDATE 11/30/2018CM Mr. Tandy, 76 year old male returns for follow-up with history of seizure event April 2017.  He was placed on Keppra but complained of side effects to the medication particularly dizziness.  When last seen by Dr. Jaynee Eagles he had repeat EEG which was normal.  Patient at that time wanted to come off of the [place] and he went on a slow taper for 4 weeks and then stopped the medication.  He understands that he had an unprovoked seizure with an abnormal EEG originally and there is a risk of having another seizure in the future.  He just says I am on enough medication and he does not wish to be switched to another seizure medication.  He has not had further seizure activity.  He returns for reevaluation  Interval history 09/11/2016: No episodes since starting Keppra. He has had side effects to Keppra. He says he is very tired. This is chronic however and precedes the Keppra. He is also on chronic opioids for pain. At last appointment patient also talked about dizziness with Keppra. He is recently treated for depression with Cymbalta. I discussed his fatigue may be due to multiple reasons including polypharmacy, obesity, lifestyle and depression. At this point we can change his Keppra to extended release once daily. Did discuss that there may be breakthrough seizures when changing medications. He would like to come off the [place], I did discuss with patient's that he had an unprovoked seizure with abnormal EEG and there is risks if we take him off the seizure medication of having another seizure which could cause significant morbidity and mortality. What we could do is a 60 which will be reassuring if normal however a normal EEG does not mean that he will have a repeat seizure in the future. Discussed this with  wife and patient who acknowledge risks and understanding. May also consider changing Keppra to another medication.   Interval history 12/31/2015 : No episodes since starting the Keppra. He is a little dizzy. Since starting the [place]. He doesn't feel like he has his balance at times. No room spinning. Only when standing and walking he feel lightheaded. Not when sitting. He drinks 6 glasses a day. His blood pressure usually run about 120/70. Discussed differential which could be medication related, orthostatic hypotension. We decided to decreased his Keppra to 500mg  twice a day, still a therapeutic dose. If he has any events he is to call me asap to increase. He is going to drink extra water. Will also check his orthi vitals today. If he continues to feel lightheaded we can change his AED.  WRU:EAVWU D Jonesis a 76 y.o.malehere as a referral from Dr. Argentina Ponder seizures. PMHx obesity, HLD, CAD, HTN, chronic pain, SOB. This was his first seizure. He has never had a seizure. No FHx of seizures. Wfe walked in and saw him jerking all 4 limbs, drooling on the sides of the mouth, eyes closed, she was trying to slap him on the face, she called 911. He became combative. He was "out of it" and he was fighting the EMS. He was given a sedative to get him on the stretcher. Didn't recognize is wife. He doesn't remember anything. No FHx seizures. No new medications. No previous illnesses. For about a month previous he was feeling funny. He is depressed about these changes. The  funny feeling is better on the medication. No inciting events, no previous illnesses, no head trauma. Medication makes him a little tired. Event lasted briefly, a minute of two with associated hard breathing like he was snoring   REVIEW OF SYSTEMS: Full 14 system review of systems performed and notable only for those listed, all others are neg:  Constitutional: neg  Cardiovascular: neg Ear/Nose/Throat: Hearing loss with bilateral hearing  aids Skin: neg Eyes: neg Respiratory: neg Gastroitestinal: neg  Hematology/Lymphatic: neg  Endocrine: neg Musculoskeletal: Joint pain back pain chronic opioid use Allergy/Immunology: neg Neurological: History of one seizure event Psychiatric: Depression Sleep : Obstructive sleep apnea with CPAP   ALLERGIES: Allergies  Allergen Reactions  . Ciprofloxacin Rash  . Oxycodone-Acetaminophen Rash and Other (See Comments)    Felt like he was hot    HOME MEDICATIONS: Outpatient Medications Prior to Visit  Medication Sig Dispense Refill  . atorvastatin (LIPITOR) 40 MG tablet TAKE ONE TABLET BY MOUTH ONCE DAILY. 30 tablet 5  . cholecalciferol (VITAMIN D) 1000 UNITS tablet Take 1,000 Units by mouth daily.    . Cyanocobalamin (VITAMIN B-12 PO) Take 1,000 mg by mouth daily.    . dabigatran (PRADAXA) 150 MG CAPS capsule Take 1 capsule (150 mg total) by mouth 2 (two) times daily. 180 capsule 3  . DULoxetine (CYMBALTA) 30 MG capsule TAKE (1) CAPSULE BY MOUTH ONCE DAILY. 30 capsule 12  . HYDROmorphone (DILAUDID) 2 MG tablet 1 q 4 hours prn pain ( no greater than 6 per day) 180 tablet 0  . lisinopril (PRINIVIL,ZESTRIL) 10 MG tablet TAKE (1) TABLET BY MOUTH EACH MORNING. 30 tablet 2  . Omega-3 Fatty Acids (FISH OIL) 1000 MG CAPS Take 1,000 mg by mouth daily.    Marland Kitchen omeprazole (PRILOSEC) 20 MG capsule TAKE ONE CAPSULE BY MOUTH ONCE DAILY. 30 capsule 12  . polyethylene glycol (MIRALAX / GLYCOLAX) packet Take 17 g by mouth daily as needed for mild constipation.    . Methylcellulose, Laxative, (CITRUCEL) 500 MG TABS Take 1 tablet by mouth daily.      No facility-administered medications prior to visit.     PAST MEDICAL HISTORY: Past Medical History:  Diagnosis Date  . Chronic pain   . Complication of anesthesia    pt woke up during last surgery   . Coronary artery disease 02-27-11   Dr. Jacquiline Doe follows-has some blocked coronary arteries ,not suitable for stent  placement  .  Dyslipidemia   . Dysrhythmia    hx of atrial fib during last hospitalization   . GERD (gastroesophageal reflux disease) 02-27-11   Acid reflux  . Hearing loss 02-27-11   Bilateral hearing aids-due to exposure to loud machinery  . Hemorrhoids 02-27-11   not bothersome at this time  . Hyperlipidemia   . Hypertension   . Leg swelling   . Obesity   . Osteoarthritis 02-27-11   Ostearthritis-knees, shoulders.Back causes chronic pain-radiates down right leg  . Rash   . Seizures ([place]) 07/2014  . Shingles outbreak 02-27-11   2 weeks ago , was tx.-only residual is tenderness of right scalp-no open areas  . Shortness of breath    with exertion   . Urinary incontinence 02-27-11   not an everday occurrence-no special measures    PAST SURGICAL HISTORY: Past Surgical History:  Procedure Laterality Date  . CHOLECYSTECTOMY  03/14/2011   Procedure: LAPAROSCOPIC CHOLECYSTECTOMY;  Surgeon: Edward Jolly, MD;  Location: [facility];  Service: General;  Laterality: N/A;  . COLONOSCOPY    .  INJECTION KNEE  03/05/2011   Procedure: KNEE INJECTION;  Surgeon: Gearlean Alf;  Location: [facility];  Service: Orthopedics;  Laterality: Left;  80 mg depomedrol  . LAPAROSCOPY  03/16/2011   Procedure: LAPAROSCOPY DIAGNOSTIC;  Surgeon: Judieth Keens, DO;  Location: [facility];  Service: General;  Laterality: N/A;  repair incisional hernia  . REPLACEMENT TOTAL KNEE  02-27-11   right- 2007  . TOTAL KNEE ARTHROPLASTY  03/01/2012   Procedure: TOTAL KNEE ARTHROPLASTY;  Surgeon: Gearlean Alf, MD;  Location: [facility];  Service: Orthopedics;  Laterality: Left;  . TOTAL KNEE REVISION  03/05/2011   Procedure: TOTAL KNEE REVISION;  Surgeon: Dione Plover Aluisio;  Location: [facility];  Service: Orthopedics;  Laterality: Right;    FAMILY HISTORY: Family History  Problem Relation Age of Onset  . Heart attack Father        mi I N HIS 40'S BUT LIVED INTO HIS 90'S WITH COPD  . COPD Father   . Cancer Father        colon  .  Other Mother 18       died multiple med problems  . Heart disease Mother   . Hypertension Mother   . Heart attack Sister 65  . Seizures Neg Hx     SOCIAL HISTORY: Social History   Socioeconomic History  . Marital status: Married    Spouse name: Not on file  . Number of children: 3  . Years of education: 59  . Highest education level: Not on file  Social Needs  . Financial resource strain: Not on file  . Food insecurity - worry: Not on file  . Food insecurity - inability: Not on file  . Transportation needs - medical: Not on file  . Transportation needs - non-medical: Not on file  Occupational History  . Occupation: Retired    Fish farm manager: RETIRED  Tobacco Use  . Smoking status: Former Smoker    Start date: 12/24/1961  . Smokeless tobacco: Former Systems developer    Types: [place] date: 01/27/1973  Substance and Sexual Activity  . Alcohol use: No  . Drug use: No  . Sexual activity: Yes    Birth control/protection: None  Other Topics Concern  . Not on file  Social History Narrative   Lives at home w/ his wife   Right-handed   Caffeine: some daily     PHYSICAL EXAM  Vitals:   03/13/17 0909  BP: (!) 131/91  Pulse: 81  Weight: (!) 322 lb (146.1 kg)   Body mass index is 44.91 kg/m.  Generalized: Well developed, obese male in no acute distress  Head: normocephalic and atraumatic,. Oropharynx benign  Neck: Supple,  Musculoskeletal: No deformity   Neurological examination   Mentation: Alert oriented to time, place, history taking. Attention span and concentration appropriate. Recent and remote memory intact.  Follows all commands speech and language fluent.   Cranial nerve II-XII: Pupils were equal round reactive to light extraocular movements were full, visual field were full on confrontational test. Facial sensation and strength were normal. hearing was intact to finger rubbing bilaterally. Uvula tongue midline. head turning and shoulder shrug were normal and  symmetric.Tongue protrusion into cheek strength was normal. Motor: normal bulk and tone, full strength in the BUE, BLE, fine finger movements normal, no pronator drift. No focal weakness Sensory: normal and symmetric to light touch,  Coordination: finger-nose-finger, heel-to-shin bilaterally, no dysmetria Reflexes: Brachioradialis 2/2, biceps 2/2, triceps 2/2, patellar 2/2, Achilles 2/2,  plantar responses were flexor bilaterally. Gait and Station: Rising up from seated position without assistance, wide-based stance,  moderate stride, good arm swing, smooth turning, DIAGNOSTIC DATA (LABS, IMAGING, TESTING) - I reviewed patient records, labs, notes, testing and imaging myself where available.  Lab Results  Component Value Date   WBC 7.1 11/05/2016   HGB 15.4 11/05/2016   HCT 45.5 11/05/2016   MCV 93 11/05/2016   PLT 225 11/05/2016      Component Value Date/Time   NA 141 03/02/2017 0807   K 4.9 03/02/2017 0807   CL 101 03/02/2017 0807   CO2 24 03/02/2017 0807   GLUCOSE 117 (H) 03/02/2017 0807   GLUCOSE 108 (H) 07/20/2015 0435   BUN 20 03/02/2017 0807   CREATININE 1.03 03/02/2017 0807   CREATININE 0.96 01/23/2014 0708   CALCIUM 9.3 03/02/2017 0807   PROT 6.6 03/02/2017 0807   ALBUMIN 4.0 03/02/2017 0807   AST 22 03/02/2017 0807   ALT 15 03/02/2017 0807   ALKPHOS 77 03/02/2017 0807   BILITOT 0.5 03/02/2017 0807   GFRNONAA 71 03/02/2017 0807   GFRAA 82 03/02/2017 0807   Lab Results  Component Value Date   CHOL 129 03/02/2017   HDL 41 03/02/2017   LDLCALC 68 03/02/2017   TRIG 101 03/02/2017   CHOLHDL 3.1 03/02/2017   Lab Results  Component Value Date   HGBA1C 6.0 (H) 03/02/2017   No results found for: [id number] Lab Results  Component Value Date   TSH 2.170 11/05/2016      ASSESSMENT AND PLAN 76 year old patient with a history of an unprovoked generalized seizure approximately 1 year ago with an abnormal EEG with focal epileptiform activity. He's been on Keppra  without any other events. Pt had  side effects to Keppra.  Patient had repeat EEG which was normal and he tapered slowly off of his Keppra.  He has not had further seizure events. He was made aware that even though  EEG is normal  A  seizures can cause significant morbidity and mortality. Wife and patient understand this.  Patient does not want to be placed on another seizure medication  PLAN: Discussed with Dr. Jaynee Eagles Patient titrated off Keppra Last EEG normal F/U prn only Risk of recurrent seizure discussed see above Dennie Bible, [facility], [facility], APRN  [facility] [address] [place], [place] [address] [phone]

## 2017-03-13 ENCOUNTER — Ambulatory Visit (INDEPENDENT_AMBULATORY_CARE_PROVIDER_SITE_OTHER): Payer: PPO | Admitting: Nurse Practitioner

## 2017-03-13 ENCOUNTER — Encounter: Payer: Self-pay | Admitting: Nurse Practitioner

## 2017-03-13 VITALS — BP 131/91 | HR 81 | Wt 322.0 lb

## 2017-03-13 DIAGNOSIS — R569 Unspecified convulsions: Secondary | ICD-10-CM | POA: Diagnosis not present

## 2017-03-13 NOTE — Patient Instructions (Signed)
Patient titrated off Keppra Last EEG normal F/U prn only

## 2017-03-19 ENCOUNTER — Ambulatory Visit: Payer: PPO | Admitting: Cardiovascular Disease

## 2017-03-19 ENCOUNTER — Encounter: Payer: Self-pay | Admitting: Cardiovascular Disease

## 2017-03-19 VITALS — BP 138/82 | Ht 71.0 in | Wt 320.0 lb

## 2017-03-19 DIAGNOSIS — I480 Paroxysmal atrial fibrillation: Secondary | ICD-10-CM | POA: Diagnosis not present

## 2017-03-19 DIAGNOSIS — I2510 Atherosclerotic heart disease of native coronary artery without angina pectoris: Secondary | ICD-10-CM | POA: Diagnosis not present

## 2017-03-19 MED ORDER — APIXABAN 5 MG PO TABS
5.0000 mg | ORAL_TABLET | Freq: Two times a day (BID) | ORAL | 11 refills | Status: DC
Start: 2017-03-19 — End: 2018-03-29

## 2017-03-19 NOTE — Progress Notes (Signed)
Cardiology Office Note Date:  03/19/2017   ID:  Steven, [name] [birth year], MRN [id number]  PCP:  Kathyrn Drown, MD  Cardiologist:  Sherren Mocha, MD    Chief Complaint  Patient presents with  . Shortness of Breath     History of Present Illness: Steven [name] is a 76 y.o. male who presents for follow-up of atrial fibrillation and coronary artery disease.  The patient's coronary disease has been managed medically.  He has progressed from rare paroxysmal atrial fibrillation to persistent atrial fibrillation over the last year.  LV function has been normal.  He is limited by arthritis and obesity.  The patient is here with his wife today.  He reports no change in symptoms.  Primarily limited by hip pain and knee pain.  He does admit to shortness of breath with physical activity.  No heart palpitations, orthopnea, or PND.  He has mild leg swelling which is unchanged.   Past Medical History:  Diagnosis Date  . Chronic pain   . Complication of anesthesia    pt woke up during last surgery   . Coronary artery disease 02-27-11   Dr. Jacquiline Doe follows-has some blocked coronary arteries ,not suitable for stent  placement  . Dyslipidemia   . Dysrhythmia    hx of atrial fib during last hospitalization   . GERD (gastroesophageal reflux disease) 02-27-11   Acid reflux  . Hearing loss 02-27-11   Bilateral hearing aids-due to exposure to loud machinery  . Hemorrhoids 02-27-11   not bothersome at this time  . Hyperlipidemia   . Hypertension   . Leg swelling   . Obesity   . Osteoarthritis 02-27-11   Ostearthritis-knees, shoulders.Back causes chronic pain-radiates down right leg  . Rash   . Seizures ([place]) 07/2014  . Shingles outbreak 02-27-11   2 weeks ago , was tx.-only residual is tenderness of right scalp-no open areas  . Shortness of breath    with exertion   . Urinary incontinence 02-27-11   not an everday occurrence-no special measures    Past Surgical History:    Procedure Laterality Date  . CHOLECYSTECTOMY  03/14/2011   Procedure: LAPAROSCOPIC CHOLECYSTECTOMY;  Surgeon: Edward Jolly, MD;  [facility];  Service: General;  Laterality: N/A;  . COLONOSCOPY    . INJECTION KNEE  03/05/2011   Procedure: KNEE INJECTION;  Surgeon: Gearlean Alf;  [facility];  Service: Orthopedics;  Laterality: Left;  80 mg depomedrol  . LAPAROSCOPY  03/16/2011   Procedure: LAPAROSCOPY DIAGNOSTIC;  Surgeon: Judieth Keens, DO;  [facility];  Service: General;  Laterality: N/A;  repair incisional hernia  . REPLACEMENT TOTAL KNEE  02-27-11   right- 2007  . TOTAL KNEE ARTHROPLASTY  03/01/2012   Procedure: TOTAL KNEE ARTHROPLASTY;  Surgeon: Gearlean Alf, MD;  Location: [facility];  Service: Orthopedics;  Laterality: Left;  . TOTAL KNEE REVISION  03/05/2011   Procedure: TOTAL KNEE REVISION;  Surgeon: Dione Plover Aluisio;  Location: [facility];  Service: Orthopedics;  Laterality: Right;    Current Outpatient Medications  Medication Sig Dispense Refill  . atorvastatin (LIPITOR) 40 MG tablet TAKE ONE TABLET BY MOUTH ONCE DAILY. 30 tablet 5  . cholecalciferol (VITAMIN D) 1000 UNITS tablet Take 1,000 Units by mouth daily.    . Cyanocobalamin (VITAMIN B-12 PO) Take 1,000 mg by mouth daily.    . DULoxetine (CYMBALTA) 30 MG capsule TAKE (1) CAPSULE BY MOUTH ONCE DAILY. 30 capsule 12  .  HYDROmorphone (DILAUDID) 2 MG tablet 1 q 4 hours prn pain ( no greater than 6 per day) 180 tablet 0  . lisinopril (PRINIVIL,ZESTRIL) 10 MG tablet TAKE (1) TABLET BY MOUTH EACH MORNING. 30 tablet 2  . Methylcellulose, Laxative, (CITRUCEL) 500 MG TABS Take 1 tablet by mouth daily.     . Omega-3 Fatty Acids (FISH OIL) 1000 MG CAPS Take 1,000 mg by mouth daily.    Marland Kitchen omeprazole (PRILOSEC) 20 MG capsule TAKE ONE CAPSULE BY MOUTH ONCE DAILY. 30 capsule 12  . polyethylene glycol (MIRALAX / GLYCOLAX) packet Take 17 g by mouth daily as needed for mild constipation.    Marland Kitchen apixaban (ELIQUIS) 5 MG  TABS tablet Take 1 tablet (5 mg total) by mouth 2 (two) times daily. 60 tablet 11   No current facility-administered medications for this visit.     Allergies:   Ciprofloxacin and Oxycodone-acetaminophen   Social History:  The patient  reports that he has quit smoking. He started smoking about 55 years ago. He quit smokeless tobacco use about 44 years ago. His smokeless tobacco use included chew. He reports that he does not drink alcohol or use drugs.   Family History:  The patient's family history includes COPD in his father; Cancer in his father; Heart attack in his father; Heart attack (age of onset: 47) in his sister; Heart disease in his mother; Hypertension in his mother; Other (age of onset: 53) in his mother.    ROS:  Please see the history of present illness.  Otherwise, review of systems is positive for excessive sweating, fatigue, leg pain, snoring.  All other systems are reviewed and negative.    PHYSICAL EXAM: VS:  BP 138/82   Ht 5\' 11"  (1.803 m)   Wt (!) 320 lb (145.2 kg)   SpO2 95%   BMI 44.63 kg/m  , BMI Body mass index is 44.63 kg/m. GEN: Well nourished, well developed, pleasant obese male in no acute distress  HEENT: normal  Neck: no JVD, no masses. No carotid bruits Cardiac: irregularly irregular without murmur or gallop                Respiratory:  clear to auscultation bilaterally, normal work of breathing GI: soft, nontender, nondistended, + BS MS: no deformity or atrophy  Ext: no pretibial edema, pedal pulses 2+= bilaterally Skin: warm and dry, no rash Neuro:  Strength and sensation are intact Psych: euthymic mood, full affect  EKG:  EKG is ordered today. The ekg ordered today shows atrial fibrillation 77 bpm, otherwise within normal limits.  Recent Labs: 11/05/2016: Hemoglobin 15.4; Platelets 225; TSH 2.170 03/02/2017: ALT 15; BUN 20; Creatinine, Ser 1.03; Potassium 4.9; Sodium 141   Lipid Panel     Component Value Date/Time   CHOL 129 03/02/2017  0807   TRIG 101 03/02/2017 0807   HDL 41 03/02/2017 0807   CHOLHDL 3.1 03/02/2017 0807   CHOLHDL 3.4 07/20/2015 0435   VLDL 19 07/20/2015 0435   LDLCALC 68 03/02/2017 0807      Wt Readings from Last 3 Encounters:  03/19/17 (!) 320 lb (145.2 kg)  03/13/17 (!) 322 lb (146.1 kg)  03/12/17 (!) 321 lb 3.2 oz (145.7 kg)     Cardiac Studies Reviewed: Echo 10-06-2016: Study Conclusions  - Left ventricle: The cavity size was normal. Wall thickness was   increased in a pattern of mild LVH. Systolic function was   vigorous. The estimated ejection fraction was in the range of 65%  to 70%. - Mitral valve: There was mild regurgitation.  ASSESSMENT AND PLAN: 1.  Coronary artery disease, native vessel, without angina: The patient is stable on his current medical program.  No changes are recommended today.  2.  Persistent atrial fibrillation: The patient's heart rate is well controlled.  He said appears to be minimally symptomatic.  Pradaxa is cost prohibitive.  Will change him back to apixaban 5 mg twice daily.  He did have a rash with this earlier but was on multiple other agents at the time and his medicine regimen has been simplified.  We will give him another trial and if rash occurs again he will let me know.  3.  Morbid obesity with BMI greater than 40: Dietary counseling again done today.  We discussed specifics about a low carbohydrate diet.  4.  Hypertension: Blood pressure is controlled on lisinopril.  5.  Hyperlipidemia: Treated with atorvastatin 40 mg daily.  Recent lipids are reviewed and at goal.  Current medicines are reviewed with the patient today.  The patient does not have concerns regarding medicines.  Labs/ tests ordered today include:   Orders Placed This Encounter  Procedures  . EKG 12-Lead    Disposition:   FU 6 months  Signed, Sherren Mocha, MD  03/19/2017 5:59 PM    [facility] [address], [place], [place]  [address] Phone: [phone]; Fax: [phone]

## 2017-03-19 NOTE — Progress Notes (Signed)
Personally  participated in, made any corrections needed, and agree with history, physical, neuro exam,assessment and plan as stated above.    Antonia Ahern, MD [facility] 

## 2017-03-19 NOTE — Patient Instructions (Signed)
Medication Instructions:  1) STOP PRADAXA  2) START ELIQUIS 5 mg TWICE DAILY  Labwork: None needed at this time  Testing/Procedures: None  Follow-Up: Your provider wants you to follow-up in: 6 months with Dr. Burt Knack. You will receive a reminder letter in the mail two months in advance. If you don't receive a letter, please call our office to schedule the follow-up appointment.    Any Other Special Instructions Will Be Listed Below (If Applicable).     If you need a refill on your cardiac medications before your next appointment, please call your pharmacy.

## 2017-03-26 ENCOUNTER — Ambulatory Visit: Payer: PPO | Admitting: Cardiovascular Disease

## 2017-05-08 DIAGNOSIS — G4733 Obstructive sleep apnea (adult) (pediatric): Secondary | ICD-10-CM | POA: Diagnosis not present

## 2017-05-15 ENCOUNTER — Other Ambulatory Visit: Payer: Self-pay | Admitting: Family Medicine

## 2017-05-21 ENCOUNTER — Telehealth: Payer: Self-pay | Admitting: Family Medicine

## 2017-05-21 NOTE — Telephone Encounter (Signed)
Discussed with pt's wife Fraser Din. She verbalized understanding and said he was only having a cleaning done.

## 2017-05-21 NOTE — Telephone Encounter (Signed)
Patient is going to the dentist next week for a check up.  His wife Fraser Din) wants to know if he should continue taking his Eliquis?  There is DPR on file to provide PHI to Clar[facility].

## 2017-05-21 NOTE — Telephone Encounter (Signed)
For a standard checkup and cleaning there is no need to stop Eliquis if they are doing any type of gum surgery or root canal then that is a different story

## 2017-06-11 ENCOUNTER — Ambulatory Visit (INDEPENDENT_AMBULATORY_CARE_PROVIDER_SITE_OTHER): Payer: PPO | Admitting: Family Medicine

## 2017-06-11 ENCOUNTER — Encounter: Payer: Self-pay | Admitting: Family Medicine

## 2017-06-11 VITALS — BP 132/84 | Ht 71.0 in | Wt 327.0 lb

## 2017-06-11 DIAGNOSIS — Z79891 Long term (current) use of opiate analgesic: Secondary | ICD-10-CM

## 2017-06-11 DIAGNOSIS — I10 Essential (primary) hypertension: Secondary | ICD-10-CM

## 2017-06-11 MED ORDER — HYDROMORPHONE HCL 2 MG PO TABS: ORAL_TABLET | ORAL | 0 refills | Status: DC

## 2017-06-11 NOTE — Progress Notes (Signed)
Subjective:    Patient ID: Park Breed, male    DOB: 12-13-1940, 77 y.o.   MRN: [id number]  HPI This patient was seen today for chronic pain. Takes for right knee pain and back pain.   The medication list was reviewed and updated.   -Compliance with medication: yes  - Number patient states they take daily: 5 -6 per day  -when was the last dose patient took? today  The patient was advised the importance of maintaining medication and not using illegal substances with these.  Here for refills and follow up  The patient was educated that we can provide 3 monthly scripts for their medication, it is their responsibility to follow the instructions.  Side effects or complications from medications: none  Patient is aware that pain medications are meant to minimize the severity of the pain to allow their pain levels to improve to allow for better function. They are aware of that pain medications cannot totally remove their pain.  Due for UDT ( at least once per year) : last one aug 2017. Done today  Pt states he has enough pain meds for the month of March.   Would like 90 day on his refills for other meds.    Patient here for follow-up regarding cholesterol.  Patient does try to maintain a reasonable diet.  Patient does take the medication on a regular basis.  Denies missing a dose.  The patient denies any obvious side effects.  Prior blood work results reviewed with the patient.  The patient is aware of his cholesterol goals and the need to keep it under good control to lessen the risk of disease.  Patient does have ongoing trouble with reflux.  Takes medication on a regular basis.  Tries to minimize foods as best they can.  They understand the importance of dietary compliance.  May also try to avoid eating a large meal close to bedtime.  Patient denies any dysphagia denies hematochezia.  States medicine does a good job keeping the problem under good control.  Without the medication may  certainly have issues.They desire to continue taking their medication.  Patient for blood pressure check up. Patient relates compliance with meds. Todays BP reviewed with the patient. Patient denies issues with medication. Patient relates reasonable diet. Patient tries to minimize salt. Patient aware of BP goals.    Review of Systems  Constitutional: Negative for activity change, appetite change and fatigue.  HENT: Negative for congestion and rhinorrhea.   Respiratory: Negative for cough, chest tightness and shortness of breath.   Cardiovascular: Negative for chest pain and leg swelling.  Gastrointestinal: Negative for abdominal pain, diarrhea and nausea.  Endocrine: Negative for polydipsia and polyphagia.  Genitourinary: Negative for dysuria and hematuria.  Musculoskeletal: Positive for arthralgias and back pain.  Neurological: Negative for weakness and headaches.  Psychiatric/Behavioral: Negative for confusion and dysphoric mood.       Objective:   Physical Exam  Constitutional: He appears well-nourished. No distress.  HENT:  Head: Normocephalic and atraumatic.  Eyes: Right eye exhibits no discharge. Left eye exhibits no discharge.  Neck: No tracheal deviation present.  Cardiovascular: Normal rate, regular rhythm and normal heart sounds.  No murmur heard. Pulmonary/Chest: Effort normal and breath sounds normal. No respiratory distress. He has no wheezes.  Musculoskeletal: He exhibits no edema.  Lymphadenopathy:    He has no cervical adenopathy.  Neurological: He is alert.  Skin: Skin is warm. No rash noted.  Psychiatric: His behavior is  normal.  Vitals reviewed.         Assessment & Plan:  The patient was seen today as part of a comprehensive visit regarding pain control. Patient's compliance with the medication as well as discussion regarding effectiveness was completed. Prescriptions were written. Patient was advised to follow-up in 3 months. The patient was assessed  for any signs of severe side effects. The patient was advised to take the medicine as directed and to report to [place] if any side effect issues. 3 prescriptions were given to the patient drug registry was checked  Patient does not need any comprehensive lab work currently  Patient will do lab work before next visit.  Patient will notify us we will send in the orders for this  HTN- Patient was seen today as part of a visit regarding hypertension. The importance of healthy diet and regular physical activity was discussed. The importance of compliance with medications discussed. Ideal goal is to keep blood pressure low elevated levels certainly below 897/84 when possible. The patient was counseled that keeping blood pressure under control lessen his risk of heart attack, stroke, kidney failure, and early death. The importance of regular follow-ups was discussed with the patient. Low-salt diet such as DASH recommended. Regular physical activity was recommended as well. Patient was advised to keep regular follow-ups.

## 2017-06-15 LAB — SPECIMEN STATUS REPORT

## 2017-06-15 LAB — TOXASSURE SELECT 13 (MW), URINE

## 2017-07-09 DIAGNOSIS — H25011 Cortical age-related cataract, right eye: Secondary | ICD-10-CM | POA: Diagnosis not present

## 2017-07-09 DIAGNOSIS — H52221 Regular astigmatism, right eye: Secondary | ICD-10-CM | POA: Diagnosis not present

## 2017-07-09 DIAGNOSIS — H25811 Combined forms of age-related cataract, right eye: Secondary | ICD-10-CM | POA: Diagnosis not present

## 2017-07-09 DIAGNOSIS — H2511 Age-related nuclear cataract, right eye: Secondary | ICD-10-CM | POA: Diagnosis not present

## 2017-07-24 DIAGNOSIS — M1711 Unilateral primary osteoarthritis, right knee: Secondary | ICD-10-CM | POA: Diagnosis not present

## 2017-07-24 DIAGNOSIS — Z471 Aftercare following joint replacement surgery: Secondary | ICD-10-CM | POA: Diagnosis not present

## 2017-07-24 DIAGNOSIS — Z96651 Presence of right artificial knee joint: Secondary | ICD-10-CM | POA: Diagnosis not present

## 2017-07-26 DIAGNOSIS — Z96659 Presence of unspecified artificial knee joint: Secondary | ICD-10-CM | POA: Insufficient documentation

## 2017-08-07 ENCOUNTER — Other Ambulatory Visit: Payer: Self-pay | Admitting: Family Medicine

## 2017-08-20 DIAGNOSIS — H25812 Combined forms of age-related cataract, left eye: Secondary | ICD-10-CM | POA: Diagnosis not present

## 2017-08-20 DIAGNOSIS — H2512 Age-related nuclear cataract, left eye: Secondary | ICD-10-CM | POA: Diagnosis not present

## 2017-08-20 DIAGNOSIS — H52222 Regular astigmatism, left eye: Secondary | ICD-10-CM | POA: Diagnosis not present

## 2017-08-20 DIAGNOSIS — H25012 Cortical age-related cataract, left eye: Secondary | ICD-10-CM | POA: Diagnosis not present

## 2017-08-20 DIAGNOSIS — H52202 Unspecified astigmatism, left eye: Secondary | ICD-10-CM | POA: Diagnosis not present

## 2017-09-02 ENCOUNTER — Telehealth: Payer: Self-pay

## 2017-09-02 NOTE — Telephone Encounter (Signed)
Daughter called stating her father has been having trouble for over a month now with eating and then it going straight through him. She states he is having a lot of indigetion and having to take tums and baking soda to help with this.She states he has spells of being hot one moment and then cold the next. She does not think running temp. I advised her that we did not have any available appointments today as it is now after 4 pm in the day.I advised that we could try to see him one day this week possibly tomorrow and if gets worse in the mean time take to an urgent care or a ed. She states understanding and was sent to the front for a next available appt.

## 2017-09-03 ENCOUNTER — Encounter: Payer: Self-pay | Admitting: Family Medicine

## 2017-09-03 ENCOUNTER — Ambulatory Visit (INDEPENDENT_AMBULATORY_CARE_PROVIDER_SITE_OTHER): Payer: PPO | Admitting: Family Medicine

## 2017-09-03 VITALS — BP 118/80 | Temp 98.3°F | Ht 71.0 in | Wt 317.0 lb

## 2017-09-03 DIAGNOSIS — M545 Low back pain, unspecified: Secondary | ICD-10-CM

## 2017-09-03 DIAGNOSIS — R0789 Other chest pain: Secondary | ICD-10-CM | POA: Diagnosis not present

## 2017-09-03 DIAGNOSIS — R61 Generalized hyperhidrosis: Secondary | ICD-10-CM | POA: Diagnosis not present

## 2017-09-03 DIAGNOSIS — R197 Diarrhea, unspecified: Secondary | ICD-10-CM

## 2017-09-03 DIAGNOSIS — R5383 Other fatigue: Secondary | ICD-10-CM

## 2017-09-03 DIAGNOSIS — R0609 Other forms of dyspnea: Secondary | ICD-10-CM

## 2017-09-03 MED ORDER — PANTOPRAZOLE SODIUM 40 MG PO TBEC: 40.0000 mg | DELAYED_RELEASE_TABLET | Freq: Every day | ORAL | 3 refills | Status: DC

## 2017-09-03 MED ORDER — DULOXETINE HCL 60 MG PO CPEP: 60.0000 mg | ORAL_CAPSULE | Freq: Every day | ORAL | 5 refills | Status: DC

## 2017-09-03 MED ORDER — HYDROMORPHONE HCL 2 MG PO TABS: ORAL_TABLET | ORAL | 0 refills | Status: DC

## 2017-09-03 MED ORDER — LISINOPRIL 5 MG PO TABS: 5.0000 mg | ORAL_TABLET | Freq: Every day | ORAL | 5 refills | Status: DC

## 2017-09-03 NOTE — Progress Notes (Signed)
Subjective:    Patient ID: Steven [name], male    DOB: [birth year], 77 y.o.   MRN: [id number]  Gastroesophageal Reflux  He reports no abdominal pain, no chest pain, no coughing or no nausea. Pertinent negatives include no fatigue. Treatments tried: tums, baking soda, mylanta.  chest pressure.  He thinks it is more indigestion he has been taking multiple medicines for it he states his reflux medicine not working as well as it should  He does relate that he gets out of breath walking up an incline or walking up steps he gets out of energy quickly he also discussed how he feels woozy at times in addition to this breaks out in sweats denies PND denies orthopnea Digestive issues for about 2 months. Has loose stools after every meal. Feels like stomach is swollen after eating.   Sweating a lot.  Intermittent sweating sometimes when he is at rest other times with activity  Fatigue and irritability.  Intermittent blue spells sad spells irritable at times.     Review of Systems  Constitutional: Negative for activity change, appetite change and fatigue.  HENT: Negative for congestion and rhinorrhea.   Respiratory: Negative for cough, chest tightness and shortness of breath.   Cardiovascular: Negative for chest pain and leg swelling.  Gastrointestinal: Negative for abdominal pain, diarrhea and nausea.  Endocrine: Negative for polydipsia and polyphagia.  Genitourinary: Negative for dysuria and hematuria.  Neurological: Negative for weakness and headaches.  Psychiatric/Behavioral: Negative for confusion and dysphoric mood.       Objective:   Physical Exam  Constitutional: He appears well-nourished. No distress.  Cardiovascular: Normal rate, regular rhythm and normal heart sounds.  No murmur heard. Pulmonary/Chest: Effort normal and breath sounds normal. No respiratory distress.  Musculoskeletal: He exhibits no edema.  Lymphadenopathy:    He has no cervical adenopathy.  Neurological: He is  alert.  Psychiatric: His behavior is normal.  Vitals reviewed.  Abdomen is soft no guarding or rebound EKG no acute changes compared to the most previous EKG in the electronic record      Assessment & Plan:  Frequent loose stools probably related to excessive use of Maalox and Mylanta Change acid blockers try to get reflux under better control Reflux not under good control with omeprazole Recommend Protonix  Patient with intermittent chest pressure along with shortness of breath with activity EKG overall looks good we will send notification to his cardiology to asked him to see him sooner for evaluation for possibility of coronary artery disease echo from last year showed ejection fraction overall good patient does have atrial fibrillation  Chronic low back pain recommend physical therapy recommend reducing the pain medicine to no more than 4 5/day we will follow-up patient in 1 month to see how he is doing in regards to this  Mild to moderate depression not suicidal but sometimes wish he could just die recommend starting Cymbalta possibly this will help back pain as well as his depression he will follow-up here in 4 weeks time for recheck he agrees that if he starts feeling suicidal he will immediately seek our help  Mild orthostatic hypotension reduce lisinopril to 5 mg.  25 minutes was spent with the patient.  This statement verifies that 25 minutes was indeed spent with the patient. Greater than half the time was spent in discussion, counseling and answering questions  regarding the issues that the patient came in for today as reflected in the diagnosis (s) please refer to documentation for  further details.

## 2017-09-04 ENCOUNTER — Encounter: Payer: Self-pay | Admitting: Family Medicine

## 2017-09-04 NOTE — Progress Notes (Signed)
I called and left a message asked that he r/c. We have placed the referral in the system.

## 2017-09-04 NOTE — Progress Notes (Signed)
I have placed the urgent referral to cardiology. Per Izora Ribas she spoke with the pt.

## 2017-09-04 NOTE — Addendum Note (Signed)
Addended by: Karle Barr on: 09/04/2017 08:14 AM   Modules accepted: Orders

## 2017-09-07 LAB — CBC WITH DIFFERENTIAL/PLATELET
Basophils Absolute: 0 10*3/uL (ref 0.0–0.2)
Basos: 0 %
EOS (ABSOLUTE): 0.1 10*3/uL (ref 0.0–0.4)
Eos: 2 %
HEMATOCRIT: 47.5 % (ref 37.5–51.0)
Hemoglobin: 15.6 g/dL (ref 13.0–17.7)
IMMATURE GRANS (ABS): 0 10*3/uL (ref 0.0–0.1)
IMMATURE GRANULOCYTES: 0 %
LYMPHS: 27 %
Lymphocytes Absolute: 2.2 10*3/uL (ref 0.7–3.1)
MCH: 31.6 pg (ref 26.6–33.0)
MCHC: 32.8 g/dL (ref 31.5–35.7)
MCV: 96 fL (ref 79–97)
Monocytes Absolute: 0.6 10*3/uL (ref 0.1–0.9)
Monocytes: 7 %
NEUTROS PCT: 64 %
Neutrophils Absolute: 5 10*3/uL (ref 1.4–7.0)
PLATELETS: 264 10*3/uL (ref 150–450)
RBC: 4.93 x10E6/uL (ref 4.14–5.80)
RDW: 14 % (ref 12.3–15.4)
WBC: 8 10*3/uL (ref 3.4–10.8)

## 2017-09-07 LAB — HEPATIC FUNCTION PANEL
ALBUMIN: 4.5 g/dL (ref 3.5–4.8)
ALT: 16 IU/L (ref 0–44)
AST: 19 IU/L (ref 0–40)
Alkaline Phosphatase: 78 IU/L (ref 39–117)
Bilirubin Total: 0.5 mg/dL (ref 0.0–1.2)
Bilirubin, Direct: 0.15 mg/dL (ref 0.00–0.40)
TOTAL PROTEIN: 7.1 g/dL (ref 6.0–8.5)

## 2017-09-07 LAB — BASIC METABOLIC PANEL
BUN/Creatinine Ratio: 21 (ref 10–24)
BUN: 24 mg/dL (ref 8–27)
CO2: 23 mmol/L (ref 20–29)
CREATININE: 1.15 mg/dL (ref 0.76–1.27)
Calcium: 9.6 mg/dL (ref 8.6–10.2)
Chloride: 100 mmol/L (ref 96–106)
GFR calc Af Amer: 71 mL/min/{1.73_m2} (ref >59)
GFR calc non Af Amer: 61 mL/min/{1.73_m2} (ref >59)
GLUCOSE: 89 mg/dL (ref 65–99)
POTASSIUM: 5.2 mmol/L (ref 3.5–5.2)
SODIUM: 140 mmol/L (ref 134–144)

## 2017-09-07 LAB — HEMOGLOBIN A1C
ESTIMATED AVERAGE GLUCOSE: 123 mg/dL
Hgb A1c MFr Bld: 5.9 % — ABNORMAL HIGH (ref 4.8–5.6)

## 2017-09-07 LAB — TISSUE TRANSGLUTAMINASE, IGA: Transglutaminase IgA: <2 U/mL (ref 0–3)

## 2017-09-07 LAB — TSH: TSH: 3.04 u[IU]/mL (ref 0.450–4.500)

## 2017-09-08 ENCOUNTER — Encounter: Payer: Self-pay | Admitting: Family Medicine

## 2017-09-09 ENCOUNTER — Encounter: Payer: Self-pay | Admitting: Physician Assistant

## 2017-09-09 ENCOUNTER — Ambulatory Visit (INDEPENDENT_AMBULATORY_CARE_PROVIDER_SITE_OTHER): Payer: PPO | Admitting: Physician Assistant

## 2017-09-09 ENCOUNTER — Encounter: Payer: Self-pay | Admitting: *Deleted

## 2017-09-09 VITALS — BP 120/84 | HR 73 | Ht 70.0 in | Wt 316.0 lb

## 2017-09-09 DIAGNOSIS — G4733 Obstructive sleep apnea (adult) (pediatric): Secondary | ICD-10-CM

## 2017-09-09 DIAGNOSIS — I2510 Atherosclerotic heart disease of native coronary artery without angina pectoris: Secondary | ICD-10-CM

## 2017-09-09 DIAGNOSIS — R0609 Other forms of dyspnea: Secondary | ICD-10-CM

## 2017-09-09 DIAGNOSIS — E7800 Pure hypercholesterolemia, unspecified: Secondary | ICD-10-CM

## 2017-09-09 DIAGNOSIS — I4819 Other persistent atrial fibrillation: Secondary | ICD-10-CM

## 2017-09-09 DIAGNOSIS — R7303 Prediabetes: Secondary | ICD-10-CM | POA: Diagnosis not present

## 2017-09-09 DIAGNOSIS — I481 Persistent atrial fibrillation: Secondary | ICD-10-CM | POA: Diagnosis not present

## 2017-09-09 DIAGNOSIS — I10 Essential (primary) hypertension: Secondary | ICD-10-CM | POA: Diagnosis not present

## 2017-09-09 NOTE — Patient Instructions (Signed)
Medication Instructions:  1. Your physician recommends that you continue on your current medications as directed. Please refer to the Current Medication list given to you today.   Labwork: YOU HAVE BEEN GIVEN A PRESCRIPTION TO HAVE LAB WORK DONE 03/2018. PLEASE HAVE RESULTS FAXED TO [phone] TO Doreene Adas, PAC  Testing/Procedures: 1. Your physician has requested that you have an echocardiogram. Echocardiography is a painless test that uses sound waves to create images of your heart. It provides your doctor with information about the size and shape of your heart and how well your heart's chambers and valves are working. This procedure takes approximately one hour. There are no restrictions for this procedure.  2. Your physician has requested that you have a lexiscan myoview. For further information please visit [email or web address]. Please follow instruction sheet, as given.    Follow-Up: DR. Burt Knack IN 3 MONTHS   Any Other Special Instructions Will Be Listed Below (If Applicable).     If you need a refill on your cardiac medications before your next appointment, please call your pharmacy.

## 2017-09-09 NOTE — Progress Notes (Signed)
Cardiology Office Note:    Date:  09/09/2017   ID:  EDRIC FETTERMAN, DOB 1940/09/11, MRN [id number]  PCP:  Kathyrn Drown, MD  Cardiologist:  Sherren Mocha, MD   Referring MD: Kathyrn Drown, MD   Chief Complaint  Patient presents with  . Follow-up    CAD, Afib    History of Present Illness:    Steven [name] is a 77 y.o. male with a hx of atrial fibrillation, HLD, HTN, obesity, and CAD. CAD is managed medically, last cath in 2011 for abnormal myoview showing CAD in branches of the RCA and LCx. He has progressed from paroxysmal Afib to persistent Afib in 2018.   He presents today for follow up. He complains of progression of his dyspnea on exertion. He can no longer walk up hill to the mailbox without dyspnea. He denies chest pain, but does report one episode of left hand numbness that lasted 45-60 min with spontaneous resolution. He is about to start a PT program for his knees. He has had two knee replacements in the right knee and one in the left. He is deconditioned as this knee pain and back pain limit his physical activity. Last stress test showed mild soft tissue attenuation in the anterior wall and mild to moderate ischemia in the basal inferolateral wall. This led tot a cath in 2011 with CAD in a branch off the RCA and branch off the left Cx, no intervention and treated medically. He denies chest pain, palpitations, and syncope. He has lower extremity swelling that sounds related to his prior knee surgeries.    Past Medical History:  Diagnosis Date  . Chronic pain   . Complication of anesthesia    pt woke up during last surgery   . Coronary artery disease 02-27-11   Dr. Jacquiline Doe follows-has some blocked coronary arteries ,not suitable for stent  placement  . Dyslipidemia   . Dysrhythmia    hx of atrial fib during last hospitalization   . GERD (gastroesophageal reflux disease) 02-27-11   Acid reflux  . Hearing loss 02-27-11   Bilateral hearing aids-due to  exposure to loud machinery  . Hemorrhoids 02-27-11   not bothersome at this time  . Hyperlipidemia   . Hypertension   . Leg swelling   . Obesity   . Osteoarthritis 02-27-11   Ostearthritis-knees, shoulders.Back causes chronic pain-radiates down right leg  . Rash   . Seizures ([place]) 07/2014  . Shingles outbreak 02-27-11   2 weeks ago , was tx.-only residual is tenderness of right scalp-no open areas  . Shortness of breath    with exertion   . Urinary incontinence 02-27-11   not an everday occurrence-no special measures    Past Surgical History:  Procedure Laterality Date  . CHOLECYSTECTOMY  03/14/2011   Procedure: LAPAROSCOPIC CHOLECYSTECTOMY;  Surgeon: Edward Jolly, MD;  Location: [facility];  Service: General;  Laterality: N/A;  . COLONOSCOPY    . INJECTION KNEE  03/05/2011   Procedure: KNEE INJECTION;  Surgeon: Gearlean Alf;  Location: [facility];  Service: Orthopedics;  Laterality: Left;  80 mg depomedrol  . LAPAROSCOPY  03/16/2011   Procedure: LAPAROSCOPY DIAGNOSTIC;  Surgeon: Judieth Keens, DO;  Location: [facility];  Service: General;  Laterality: N/A;  repair incisional hernia  . REPLACEMENT TOTAL KNEE  02-27-11   right- 2007  . TOTAL KNEE ARTHROPLASTY  03/01/2012   Procedure: TOTAL KNEE ARTHROPLASTY;  Surgeon: Gearlean Alf, MD;  Location: [facility];  Service: Orthopedics;  Laterality: Left;  . TOTAL KNEE REVISION  03/05/2011   Procedure: TOTAL KNEE REVISION;  Surgeon: Dione Plover Aluisio;  Location: [facility];  Service: Orthopedics;  Laterality: Right;    Current Medications: Current Meds  Medication Sig  . apixaban (ELIQUIS) 5 MG TABS tablet Take 1 tablet (5 mg total) by mouth 2 (two) times daily.  Marland Kitchen atorvastatin (LIPITOR) 40 MG tablet TAKE ONE TABLET BY MOUTH ONCE DAILY.  . cholecalciferol (VITAMIN D) 1000 UNITS tablet Take 1,000 Units by mouth daily.  . Cyanocobalamin (VITAMIN B-12 PO) Take 1,000 mg by mouth daily.  . DULoxetine (CYMBALTA) 60 MG capsule Take 1 capsule  (60 mg total) by mouth daily.  Marland Kitchen HYDROmorphone (DILAUDID) 2 MG tablet 1 q 4 hours prn pain ( no greater than 6 per day)  . lisinopril (PRINIVIL,ZESTRIL) 5 MG tablet Take 1 tablet (5 mg total) by mouth daily.  . Methylcellulose, Laxative, (CITRUCEL) 500 MG TABS Take 1 tablet by mouth daily.   Marland Kitchen moxifloxacin (VIGAMOX) 0.5 % ophthalmic solution   . Omega-3 Fatty Acids (FISH OIL) 1000 MG CAPS Take 1,000 mg by mouth daily.  . pantoprazole (PROTONIX) 40 MG tablet Take 1 tablet (40 mg total) by mouth daily.  . polyethylene glycol (MIRALAX / GLYCOLAX) packet Take 17 g by mouth daily as needed for mild constipation.  . prednisoLONE acetate (PRED FORTE) 1 % ophthalmic suspension      Allergies:   Ciprofloxacin and Oxycodone-acetaminophen   Social History   Socioeconomic History  . Marital status: Married    Spouse name: Not on file  . Number of children: 3  . Years of education: 40  . Highest education level: Not on file  Occupational History  . Occupation: Retired    Fish farm manager: RETIRED  Social Needs  . Financial resource strain: Not on file  . Food insecurity:    Worry: Not on file    Inability: Not on file  . Transportation needs:    Medical: Not on file    Non-medical: Not on file  Tobacco Use  . Smoking status: Former Smoker    Start date: 12/24/1961  . Smokeless tobacco: Former Systems developer    Types: [place] date: 01/27/1973  Substance and Sexual Activity  . Alcohol use: No  . Drug use: No  . Sexual activity: Yes    Birth control/protection: None  Lifestyle  . Physical activity:    Days per week: Not on file    Minutes per session: Not on file  . Stress: Not on file  Relationships  . Social connections:    Talks on phone: Not on file    Gets together: Not on file    Attends religious service: Not on file    Active member of club or organization: Not on file    Attends meetings of clubs or organizations: Not on file    Relationship status: Not on file  Other Topics  Concern  . Not on file  Social History Narrative   Lives at home w/ his wife   Right-handed   Caffeine: some daily     Family History: The patient's family history includes COPD in his father; Cancer in his father; Heart attack in his father; Heart attack (age of onset: 24) in his sister; Heart disease in his mother; Hypertension in his mother; Other (age of onset: 12) in his mother. There is no history of Seizures.  ROS:   Please see the history of present  illness.     All other systems reviewed and are negative.  EKGs/Labs/Other Studies Reviewed:    The following studies were reviewed today:  Echo 10/06/16: Study Conclusions - Left ventricle: The cavity size was normal. Wall thickness was   increased in a pattern of mild LVH. Systolic function was   vigorous. The estimated ejection fraction was in the range of 65%   to 70%. - Mitral valve: There was mild regurgitation.  EKG:  EKG is ordered today.  The ekg ordered today demonstrates rate-controlled Afib with ventricular rate 73  Recent Labs: 09/03/2017: ALT 16; BUN 24; Creatinine, Ser 1.15; Hemoglobin 15.6; Platelets 264; Potassium 5.2; Sodium 140; TSH 3.040  Recent Lipid Panel    Component Value Date/Time   CHOL 129 03/02/2017 0807   TRIG 101 03/02/2017 0807   HDL 41 03/02/2017 0807   CHOLHDL 3.1 03/02/2017 0807   CHOLHDL 3.4 07/20/2015 0435   VLDL 19 07/20/2015 0435   LDLCALC 68 03/02/2017 0807    Physical Exam:    VS:  BP 120/84   Pulse 73   Ht 5\' 10"  (1.778 m)   Wt (!) 316 lb (143.3 kg)   SpO2 94%   BMI 45.34 kg/m     Wt Readings from Last 3 Encounters:  09/09/17 (!) 316 lb (143.3 kg)  09/03/17 (!) 317 lb (143.8 kg)  06/11/17 (!) 327 lb (148.3 kg)     GEN: Well nourished, well developed in no acute distress HEENT: Normal NECK: No JVD; No carotid bruits CARDIAC: irregular rhythm, regular rate, no murmur RESPIRATORY:  Clear to auscultation without rales, wheezing or rhonchi  ABDOMEN: Soft, non-tender,  non-distended MUSCULOSKELETAL:  No edema; No deformity  SKIN: Warm and dry NEUROLOGIC:  Alert and oriented x 3 PSYCHIATRIC:  Normal affect   ASSESSMENT:    1. DOE (dyspnea on exertion)   2. Atherosclerosis of native coronary artery of native heart without angina pectoris   3. Persistent atrial fibrillation ([place])   4. Essential hypertension   5. Hypercholesteremia   6. Prediabetes   7. Obstructive sleep apnea   8. Morbid obesity ([place])    PLAN:    In order of problems listed above:  DOE (dyspnea on exertion)  Atherosclerosis of native coronary artery of native heart without angina pectoris Managed medically. No ASA in the setting of eliquis. Progressively worsening DOE and one episode of left hand numbness. He has risk factors for CAD including HLD, HTN, OSA, and morbid obesity. He also has known disease from prior cath. Case discussed with Dr. Johnsie Cancel (DOD). He is not a CT coronary candidate given his Afib. Will plan for echo and lexiscan myoview. If new ischemia is detected, will proceed with repeat heart cath.    Persistent atrial fibrillation This patients CHA2DS2-VASc Score and unadjusted Ischemic Stroke Rate (% per year) is equal to 4.8 % stroke rate/year from a score of 4 (CAD, HTN, age) Pradaxa was cost prohibitive. He was switched back to eliquis (had rash with this previously). He has been doing well on eliquis. No bleeding. CBC 09/03/17 WNL. Will not repeat today. EKG today with rate controlled Afib, ventricular rate 73. Provided eliquis samples and patient assistance program. If echo and myoview are normal, may consider 48-hr holter to make sure he is rate controlled and that this is not a source of his DOE.    Essential hypertension Pressures well-controlled. Continue current lisinopril.   Hypercholesteremia On 40 mg lipitor. 03/02/2017: Cholesterol, Total 129; HDL 41; LDL Calculated 68;  Triglycerides 101 Recheck lipids and LFTs in 6 months.   Prediabetes Last A1c  5.9%  Obstructive sleep apnea Compliant on CPAP.   Morbid obesity ([place]) Activity is limited by chronic knee and back pain. About to start a PT program.    Will follow up with Dr. Burt Knack in three months, sooner if needed.    Medication Adjustments/Labs and Tests Ordered: Current medicines are reviewed at length with the patient today.  Concerns regarding medicines are outlined above.  Orders Placed This Encounter  Procedures  . Myocardial Perfusion Imaging  . EKG 12-Lead  . ECHOCARDIOGRAM COMPLETE   No orders of the defined types were placed in this encounter.   Signed, Steven Bottcher, PA  09/09/2017 11:06 AM    [facility]

## 2017-09-14 ENCOUNTER — Ambulatory Visit (HOSPITAL_COMMUNITY): Payer: PPO | Attending: Family Medicine | Admitting: Physical Therapy

## 2017-09-14 ENCOUNTER — Other Ambulatory Visit: Payer: Self-pay

## 2017-09-14 DIAGNOSIS — M5416 Radiculopathy, lumbar region: Secondary | ICD-10-CM

## 2017-09-14 DIAGNOSIS — R262 Difficulty in walking, not elsewhere classified: Secondary | ICD-10-CM

## 2017-09-14 NOTE — Therapy (Signed)
Steven [name], [place], [address] Phone: [phone]   Fax:  [phone]  Physical Therapy Evaluation  Patient Details  Name: Steven [name] MRN: [id number] Date of Birth: September 29, 1940 Referring Provider: Sallee [name]    Encounter Date: 09/14/2017  PT End of Session - 09/14/17 1020    Visit Number  1    Number of Visits  8    Date for PT Re-Evaluation  10/14/17    PT Start Time  0815    PT Stop Time  0905    PT Time Calculation (min)  50 min    Activity Tolerance  Patient tolerated treatment well    Behavior During Therapy  [facility] for tasks assessed/performed       Past Medical History:  Diagnosis Date  . Chronic pain   . Complication of anesthesia    pt woke up during last surgery   . Coronary artery disease 02-27-11   Steven [name] follows-has some blocked coronary arteries ,not suitable for stent  placement  . Dyslipidemia   . Dysrhythmia    hx of atrial fib during last hospitalization   . GERD (gastroesophageal reflux disease) 02-27-11   Acid reflux  . Hearing loss 02-27-11   Bilateral hearing aids-due to exposure to loud machinery  . Hemorrhoids 02-27-11   not bothersome at this time  . Hyperlipidemia   . Hypertension   . Leg swelling   . Obesity   . Osteoarthritis 02-27-11   Ostearthritis-knees, shoulders.Back causes chronic pain-radiates down right leg  . Rash   . Seizures ([place]) 07/2014  . Shingles outbreak 02-27-11   2 weeks ago , was tx.-only residual is tenderness of right scalp-no open areas  . Shortness of breath    with exertion   . Urinary incontinence 02-27-11   not an everday occurrence-no special measures    Past Surgical History:  Procedure Laterality Date  . CHOLECYSTECTOMY  03/14/2011   Procedure: LAPAROSCOPIC CHOLECYSTECTOMY;  Surgeon: Steven Jolly, MD;  Location: [facility];  Service: General;  Laterality: N/A;  . COLONOSCOPY    . INJECTION KNEE  03/05/2011    Procedure: KNEE INJECTION;  Surgeon: Steven [name];  Location: [facility];  Service: Orthopedics;  Laterality: Left;  80 mg depomedrol  . LAPAROSCOPY  03/16/2011   Procedure: LAPAROSCOPY DIAGNOSTIC;  Surgeon: Steven [name];  Location: [facility];  Service: General;  Laterality: N/A;  repair incisional hernia  . REPLACEMENT TOTAL KNEE  02-27-11   right- 2007  . TOTAL KNEE ARTHROPLASTY  03/01/2012   Procedure: TOTAL KNEE ARTHROPLASTY;  Surgeon: Steven Alf, MD;  [facility];  Service: Orthopedics;  Laterality: Left;  . TOTAL KNEE REVISION  03/05/2011   Procedure: TOTAL KNEE REVISION;  Surgeon: Steven [name];  [facility];  Service: Orthopedics;  Laterality: Right;    There were no vitals filed for this visit.   Subjective Assessment - 09/14/17 0829    Subjective  [name] states that he has had back pain for years.  He has had shots in the past which helped temporarily.  He has noticed lately when he sits his pain increases therefore he went to his MD who referred him to physical therapy.     Pertinent History  HTN, B TKR    Limitations  Sitting;Lifting;Standing;Walking;House hold activities    How long can you sit comfortably?  Pt is able to sit for an hour prior to having to get  up but will be bothering him before.     How long can you stand comfortably?  PT right leg becomes numb after 10-15 mintues.    How long can you walk comfortably?  walks very little [name] to lifestyle     Patient Stated Goals  sleep better wakes up 5 x a night, sit longer in comfort, less pain , walk longer     Currently in Pain?  Yes    Pain Score  7  pre medicated  goes higher    Pain Location  Back    Pain Orientation  Lower    Pain Descriptors / Indicators  Aching    Pain Type  Chronic pain    Pain Radiating Towards  Rt calf     Pain Onset  More than a month ago    Pain Frequency  Intermittent    Aggravating Factors   sitting     Pain Relieving Factors  pain meds, hemp, lying down      Effect of Pain on Daily Activities  limits          [facility] PT Assessment - 09/14/17 0001      Assessment   Medical Diagnosis  lumbar pain    Referring Provider  Steven [name]     Onset Date/Surgical Date  08/12/17    Next MD Visit  10/02/2017    Prior Therapy  no       Precautions   Precautions  None      Restrictions   Weight Bearing Restrictions  No      Balance Screen   Has the patient fallen in the past 6 months  No    Has the patient had a decrease in activity level because of a fear of falling?   Yes    Is the patient reluctant to leave their home because of a fear of falling?   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       Posture/Postural Control   Posture/Postural Control  Postural limitations    Postural Limitations  Rounded Shoulders;Decreased lumbar lordosis;Decreased thoracic kyphosis;Flexed trunk    Posture Comments  stands 12 degrees forward bent       ROM / Strength   AROM / PROM / Strength  AROM;Strength      AROM   AROM Assessment Site  Lumbar    Lumbar Flexion  fingers 4 " from floor reps improved pain     Lumbar Extension  12 degrees reps feels better       Strength   Strength Assessment Site  Hip;Knee;Ankle    Right/Left Hip  -- all wnl except B gluteal maximus mm B 3-/5     Right/Left Knee  -- B wnl  Right/Left Ankle  -- B WNL      Flexibility   Soft Tissue Assessment /Muscle Length  yes    Hamstrings  RT: 140;  Lt 150                  Objective measurements completed on examination: See above findings.      [facility] Adult PT Treatment/Exercise - 09/14/17 0001      Exercises   Exercises  Lumbar      Lumbar Exercises: Stretches   Active Hamstring Stretch  Right;2 reps;20 seconds    Single Knee to Chest Stretch  Right;2 reps;20 seconds    Standing Extension  10 reps      Lumbar Exercises: Supine   Ab Set  10 reps             PT Education - 09/14/17 1019    Education Details  HEP    Person(s) Educated  Patient    Methods  Explanation;Verbal cues;Handout    Comprehension  Verbalized understanding;Returned demonstration       PT Short Term Goals - 09/14/17 1033      PT SHORT TERM GOAL #1   Title  PT to only feel pain radiating to mid thigh level to demonstrate decreased nerve irritation     Time  2    Period  Weeks    Status  New      PT SHORT TERM GOAL #2   Title  Pt pain to be no greater than a 5/10 to allow pt to sit for an hour in comfort.     Time  2    Period  Weeks    Status  New      PT SHORT TERM GOAL #3   Title  PT to be waking up at night 4 or less times a night     Time  2    Period  Weeks    Status  New      PT SHORT TERM GOAL #4   Title  Pt to be able to walk for 15 mintues in comfort     Time  2    Status  New      PT SHORT TERM GOAL #5   Title  Pt to be able to stand for 15 mintues in comfort to complete self grooming activities.     Time  2    Period  Weeks    Status  New        PT Long Term Goals - 09/14/17 1029      PT LONG TERM GOAL #1   Title  Pt to have no radiating symptoms down his right leg     Time  4    Period  Weeks    Status  New    Target Date  10/12/17      PT LONG TERM GOAL #2   Title  Pt to be I in an advance HEP to continue to progress his functioning ability following therapy.     Time  4    Period  Weeks    Status  New      PT LONG TERM GOAL #3   Title  Pt to be able to sit for an hour and a half for traveling and  being comfortable in bible study     Time  4    Period  Weeks    Status  New      PT LONG TERM GOAL #  4   Title  Pt to be able to single leg stance for 10 seconds on both LE to reduce risk of falling.     Time  2    Period  Weeks    Status  New      PT LONG TERM GOAL #5   Title  Pt strength in B gluteal maximus to be increased one grade to allow pt to come from sit to stand without having to brace hands on his knees.     Time  4    Period  Weeks    Status  New      Additional Long Term Goals   Additional Long Term Goals  Yes      PT LONG TERM GOAL #6   Title  Pt pain level to be no greater than a 3/10 with medication     Time  4    Period  Weeks    Status  New             Plan - 09/14/17 1021    Clinical Impression Statement  Mr. Boisclair is a 77 yo male who has a hx of chronic low back pain.  In the past month he has notice the pain radiating below his right knee as well as an intolerance to sitting and standing therefore he sought medical advise who has referred him to skilled physical therapy.  Evaluation demonstrates poor posture, decreased flexibility, decreased activity tolerance, decreased strength and increased pain.  Mr. Bluemel will benefit from skilled physical therapy to address these issues and maximize his functional ability.      History and Personal Factors relevant to plan of care:  HTN, 2 Rt TKR, 1 Lt TKR, protruding abdominal mm with forward bent posture.     Clinical Presentation  Evolving    Clinical Decision Making  Moderate    Rehab Potential  Good    PT Frequency  2x / week    PT Duration  4 weeks    PT Treatment/Interventions  ADLs/Self Care Home Management;Functional mobility training;Therapeutic activities;Therapeutic exercise;Balance training;Patient/family education;Manual techniques;Passive range of motion    PT Next Visit Plan  begin wall arch, decompression exercises 1-5, balance  and progress lumbar stabilization exercises    PT Home Exercise  Plan  eval:  ab set, bridge, active hamstring stretch and knee to chest       Patient will benefit from skilled therapeutic intervention in order to improve the following deficits and impairments:  Cardiopulmonary status limiting activity, Decreased activity tolerance, Decreased balance, Decreased range of motion, Decreased strength, Difficulty walking, Impaired flexibility, Postural dysfunction, Pain, Obesity  Visit Diagnosis: Radiculopathy, lumbar region  Difficulty in walking     Problem List Patient Active Problem List   Diagnosis Date Noted  . Complex partial seizure evolving to generalized seizure ([place]) 08/13/2015  . New onset seizure ([place]) 07/19/2015  . Seizure ([place]) 07/19/2015  . Obstructive sleep apnea 10/07/2014  . Morbid obesity ([place]) 05/23/2014  . Prediabetes 01/30/2014  . Chronic back pain 11/28/2013  . Chronic, continuous use of opioids 11/28/2013  . Postop Acute blood loss anemia 03/15/2012  . OA (osteoarthritis) of knee 03/01/2012  . Hypokalemia 03/19/2011  . Persistent atrial fibrillation ([place]) 03/17/2011  . HTN (hypertension) 03/12/2011  . Hypercholesteremia 03/12/2011  . Chronic knee pain 03/12/2011  . Postop Hyponatremia 03/12/2011  . Acute cholecystitis 03/12/2011  . Anemia 03/12/2011  . CORONARY ATHEROSCLEROSIS NATIVE CORONARY ARTERY 03/20/2010  . OTHER DYSPNEA AND RESPIRATORY ABNORMALITIES  02/08/2010  . ABNORMAL ELECTROCARDIOGRAM 02/08/2010    Rayetta Humphrey, PT CLT [phone] 09/14/2017, 10:36 AM  [facility] [address] [place], [place], [address] Phone: [phone]   Fax:  [phone]  Name: Steven [name] MRN: [id number] Date of Birth: 10/26/1940

## 2017-09-14 NOTE — Patient Instructions (Addendum)
Backward Bend (Standing)    Arch backward to make hollow of back deeper. Hold __3_ seconds. Repeat 5____ times per set. Do 1__ sets per session. Do __3__ sessions per day.  http://orth.exer.us/178   Copyright  VHI. All rights reserved.  Hamstring Stretch: Active    Support behind right knee. Starting with knee bent, attempt to straighten knee until a comfortable stretch is felt in back of thigh. Hold __20__ seconds. Repeat ___5_ times per set. Do ___1_ sets per session. Do ___2_ sessions per day.  http://orth.exer.us/158   Copyright  VHI. All rights reserved.  Knee-to-Chest Stretch: Unilateral    With hand behind right knee, pull knee in to chest until a comfortable stretch is felt in lower back and buttocks. Keep back relaxed. Hold __20__ seconds. Repeat _5___ times per set. Do __1__ sets per session. Do _2___ sessions per day.  http://orth.exer.us/126   Copyright  VHI. All rights reserved.  Isometric Abdominal    Lying on back with knees bent, tighten stomach by pressing elbows down. Hold __5__ seconds. Repeat _10___ times per set. Do __1__ sets per session. Do __4__ sessions per day.  http://orth.exer.us/1086   Copyright  VHI. All rights reserved.  Bridging    Slowly raise buttocks from floor, keeping stomach tight and hips even.  Repeat _10___ times per set. Do __1__ sets per session. Do _2___ sessions per day.  http://orth.exer.us/1096   Copyright  VHI. All rights reserved.

## 2017-09-15 ENCOUNTER — Encounter: Payer: Self-pay | Admitting: Family Medicine

## 2017-09-16 ENCOUNTER — Telehealth (HOSPITAL_COMMUNITY): Payer: Self-pay | Admitting: *Deleted

## 2017-09-16 ENCOUNTER — Ambulatory Visit (HOSPITAL_COMMUNITY)
Admission: RE | Admit: 2017-09-16 | Discharge: 2017-09-16 | Disposition: A | Discharge status: Home / Self Care | Payer: PPO | Source: Ambulatory Visit | Attending: Physician Assistant | Admitting: Physician Assistant

## 2017-09-16 DIAGNOSIS — R0609 Other forms of dyspnea: Secondary | ICD-10-CM | POA: Diagnosis not present

## 2017-09-16 DIAGNOSIS — I350 Nonrheumatic aortic (valve) stenosis: Secondary | ICD-10-CM | POA: Insufficient documentation

## 2017-09-16 DIAGNOSIS — I10 Essential (primary) hypertension: Secondary | ICD-10-CM | POA: Diagnosis not present

## 2017-09-16 DIAGNOSIS — R7303 Prediabetes: Secondary | ICD-10-CM | POA: Insufficient documentation

## 2017-09-16 DIAGNOSIS — I481 Persistent atrial fibrillation: Secondary | ICD-10-CM | POA: Diagnosis not present

## 2017-09-16 DIAGNOSIS — G4733 Obstructive sleep apnea (adult) (pediatric): Secondary | ICD-10-CM | POA: Diagnosis not present

## 2017-09-16 DIAGNOSIS — Z6841 Body Mass Index (BMI) 40.0 and over, adult: Secondary | ICD-10-CM | POA: Diagnosis not present

## 2017-09-16 DIAGNOSIS — E7800 Pure hypercholesterolemia, unspecified: Secondary | ICD-10-CM | POA: Insufficient documentation

## 2017-09-16 NOTE — Progress Notes (Signed)
*  PRELIMINARY RESULTS* Echocardiogram 2D Echocardiogram has been performed.  Samuel Germany 09/16/2017, 9:22 AM

## 2017-09-16 NOTE — Telephone Encounter (Signed)
Left message on voicemail per DPR in reference to upcoming appointment scheduled on 09/21/17 with detailed instructions given per Myocardial Perfusion Study Information Sheet for the test. LM to arrive 15 minutes early, and that it is imperative to arrive on time for appointment to keep from having the test rescheduled. If you need to cancel or reschedule your appointment, please call the office within 24 hours of your appointment. Failure to do so may result in a cancellation of your appointment, and a $50 no show fee. Phone number given for call back for any questions. Kirstie Peri, RN

## 2017-09-17 ENCOUNTER — Ambulatory Visit (HOSPITAL_COMMUNITY): Payer: PPO | Admitting: Physical Therapy

## 2017-09-17 ENCOUNTER — Encounter (HOSPITAL_COMMUNITY): Payer: Self-pay | Admitting: Physical Therapy

## 2017-09-17 ENCOUNTER — Telehealth: Payer: Self-pay | Admitting: *Deleted

## 2017-09-17 DIAGNOSIS — M5416 Radiculopathy, lumbar region: Secondary | ICD-10-CM | POA: Diagnosis not present

## 2017-09-17 NOTE — Telephone Encounter (Signed)
-----   Message from Nuala Alpha, LPN sent at 09/12/4828  2:29 PM EDT -----   ----- Message ----- From: Ledora Bottcher, PA Sent: 09/17/2017   1:35 PM To: Sherren Mocha, MD, [facility] Triage  Your echo is unchanged from your last study. Please continue with stress test.

## 2017-09-17 NOTE — Therapy (Signed)
[place], [place], [address] Phone: [phone]   Fax:  [phone]  Physical Therapy Treatment  Patient Details  Name: Steven [name] MRN: [id number] Date of Birth: 1940/08/25 Referring Provider: Sallee Lange    Encounter Date: 09/17/2017  PT End of Session - 09/17/17 1019    Visit Number  2    Number of Visits  8    Date for PT Re-Evaluation  10/14/17    PT Start Time  0950    PT Stop Time  1030    PT Time Calculation (min)  40 min    Activity Tolerance  Patient tolerated treatment well    Behavior During Therapy  [facility] for tasks assessed/performed       Past Medical History:  Diagnosis Date  . Chronic pain   . Complication of anesthesia    pt woke up during last surgery   . Coronary artery disease 02-27-11   Dr. Jacquiline Doe follows-has some blocked coronary arteries ,not suitable for stent  placement  . Dyslipidemia   . Dysrhythmia    hx of atrial fib during last hospitalization   . GERD (gastroesophageal reflux disease) 02-27-11   Acid reflux  . Hearing loss 02-27-11   Bilateral hearing aids-due to exposure to loud machinery  . Hemorrhoids 02-27-11   not bothersome at this time  . Hyperlipidemia   . Hypertension   . Leg swelling   . Obesity   . Osteoarthritis 02-27-11   Ostearthritis-knees, shoulders.Back causes chronic pain-radiates down right leg  . Rash   . Seizures ([place]) 07/2014  . Shingles outbreak 02-27-11   2 weeks ago , was tx.-only residual is tenderness of right scalp-no open areas  . Shortness of breath    with exertion   . Urinary incontinence 02-27-11   not an everday occurrence-no special measures    Past Surgical History:  Procedure Laterality Date  . CHOLECYSTECTOMY  03/14/2011   Procedure: LAPAROSCOPIC CHOLECYSTECTOMY;  Surgeon: Edward Jolly, MD;  [facility];  Service: General;  Laterality: N/A;  . COLONOSCOPY    . INJECTION KNEE  03/05/2011   Procedure: KNEE INJECTION;  Surgeon: Gearlean Alf;  [facility];  Service: Orthopedics;  Laterality: Left;  80 mg depomedrol  . LAPAROSCOPY  03/16/2011   Procedure: LAPAROSCOPY DIAGNOSTIC;  Surgeon: Judieth Keens, DO;  [facility];  Service: General;  Laterality: N/A;  repair incisional hernia  . REPLACEMENT TOTAL KNEE  02-27-11   right- 2007  . TOTAL KNEE ARTHROPLASTY  03/01/2012   Procedure: TOTAL KNEE ARTHROPLASTY;  Surgeon: Gearlean Alf, MD;  [facility];  Service: Orthopedics;  Laterality: Left;  . TOTAL KNEE REVISION  03/05/2011   Procedure: TOTAL KNEE REVISION;  Surgeon: Dione Plover Aluisio;  [facility];  Service: Orthopedics;  Laterality: Right;    There were no vitals filed for this visit.  Subjective Assessment - 09/17/17 0953    Subjective  PT states that he is sore from the exercises.     Pertinent History  HTN, B TKR    Limitations  Sitting;Lifting;Standing;Walking;House hold activities    How long can you sit comfortably?  Pt is able to sit for an hour prior to having to get up but will be bothering him before.     How long can you stand comfortably?  PT right leg becomes numb after 10-15 mintues.    How long can you walk comfortably?  walks very  little do to lifestyle     Patient Stated Goals  sleep better wakes up 5 x a night, sit longer in comfort, less pain , walk longer     Currently in Pain?  Yes    Pain Score  4     Pain Location  Knee    Pain Orientation  Right;Left    Pain Onset  More than a month ago            [facility] Adult PT Treatment/Exercise - 09/17/17 0001      Posture/Postural Control   Posture/Postural Control  Postural limitations    Postural Limitations  Rounded Shoulders;Decreased lumbar lordosis;Decreased thoracic kyphosis;Flexed trunk    Posture Comments  stands 12 degrees forward bent       Exercises   Exercises  Lumbar      Lumbar Exercises: Stretches   Active Hamstring Stretch  Right;3 reps;30 seconds     Single Knee to Chest Stretch  Right;2 reps;20 seconds    Lower Trunk Rotation  5 reps    Standing Extension  10 reps      Lumbar Exercises: Standing   Other Standing Lumbar Exercises  wall arch x 10      Lumbar Exercises: Supine   Ab Set  10 reps    Other Supine Lumbar Exercises  decompression exercises 1-5              PT Education - 09/17/17 1027    Education Details  bed mobility    Person(s) Educated  Patient    Methods  Explanation    Comprehension  Verbalized understanding;Returned demonstration;Need further instruction       PT Short Term Goals - 09/14/17 1033      PT SHORT TERM GOAL #1   Title  PT to only feel pain radiating to mid thigh level to demonstrate decreased nerve irritation     Time  2    Period  Weeks    Status  New      PT SHORT TERM GOAL #2   Title  Pt pain to be no greater than a 5/10 to allow pt to sit for an hour in comfort.     Time  2    Period  Weeks    Status  New      PT SHORT TERM GOAL #3   Title  PT to be waking up at night 4 or less times a night     Time  2    Period  Weeks    Status  New      PT SHORT TERM GOAL #4   Title  Pt to be able to walk for 15 mintues in comfort     Time  2    Status  New      PT SHORT TERM GOAL #5   Title  Pt to be able to stand for 15 mintues in comfort to complete self grooming activities.     Time  2    Period  Weeks    Status  New        PT Long Term Goals - 09/14/17 1029      PT LONG TERM GOAL #1   Title  Pt to have no radiating symptoms down his right leg     Time  4    Period  Weeks    Status  New    Target Date  10/12/17      PT LONG TERM GOAL #2  Title  Pt to be I in an advance HEP to continue to progress his functioning ability following therapy.     Time  4    Period  Weeks    Status  New      PT LONG TERM GOAL #3   Title  Pt to be able to sit for an hour and a half for traveling and being comfortable in bible study     Time  4    Period  Weeks    Status  New       PT LONG TERM GOAL #4   Title  Pt to be able to single leg stance for 10 seconds on both LE to reduce risk of falling.     Time  2    Period  Weeks    Status  New      PT LONG TERM GOAL #5   Title  Pt strength in B gluteal maximus to be increased one grade to allow pt to come from sit to stand without having to brace hands on his knees.     Time  4    Period  Weeks    Status  New      Additional Long Term Goals   Additional Long Term Goals  Yes      PT LONG TERM GOAL #6   Title  Pt pain level to be no greater than a 3/10 with medication     Time  4    Period  Weeks    Status  New            Plan - 09/17/17 1020    Clinical Impression Statement  Evaluation and goals reviewed with patient.  Pt instructed in new exercises with verbal cuing to keep pt breathing as well as to stop substitution  patterns.     Rehab Potential  Good    PT Frequency  2x / week    PT Duration  4 weeks    PT Treatment/Interventions  ADLs/Self Care Home Management;Functional mobility training;Therapeutic activities;Therapeutic exercise;Balance training;Patient/family education;Manual techniques;Passive range of motion    PT Next Visit Plan  V lift off the wall,  tandem stance ,  bridge progress stabilization     PT Home Exercise Plan  eval:  ab set, bridge, active hamstring stretch and knee to chest       Patient will benefit from skilled therapeutic intervention in order to improve the following deficits and impairments:  Cardiopulmonary status limiting activity, Decreased activity tolerance, Decreased balance, Decreased range of motion, Decreased strength, Difficulty walking, Impaired flexibility, Postural dysfunction, Pain, Obesity  Visit Diagnosis: Radiculopathy, lumbar region     Problem List Patient Active Problem List   Diagnosis Date Noted  . Complex partial seizure evolving to generalized seizure ([place]) 08/13/2015  . New onset seizure ([place]) 07/19/2015  . Seizure ([place]) 07/19/2015  .  Obstructive sleep apnea 10/07/2014  . Morbid obesity ([place]) 05/23/2014  . Prediabetes 01/30/2014  . Chronic back pain 11/28/2013  . Chronic, continuous use of opioids 11/28/2013  . Postop Acute blood loss anemia 03/15/2012  . OA (osteoarthritis) of knee 03/01/2012  . Hypokalemia 03/19/2011  . Persistent atrial fibrillation ([place]) 03/17/2011  . HTN (hypertension) 03/12/2011  . Hypercholesteremia 03/12/2011  . Chronic knee pain 03/12/2011  . Postop Hyponatremia 03/12/2011  . Acute cholecystitis 03/12/2011  . Anemia 03/12/2011  . CORONARY ATHEROSCLEROSIS NATIVE CORONARY ARTERY 03/20/2010  . OTHER DYSPNEA AND RESPIRATORY ABNORMALITIES 02/08/2010  . ABNORMAL ELECTROCARDIOGRAM 02/08/2010  Rayetta Humphrey, PT CLT [phone] 09/17/2017, 10:33 AM  [facility] [address] [place], [place], [address] Phone: [phone]   Fax:  [phone]  Name: Steven [name] MRN: [id number] Date of Birth: 12-15-1940

## 2017-09-17 NOTE — Telephone Encounter (Signed)
DPR on file ok to s/w pt's wife Fraser Din who has been notified of echo results. Advised pt's wife of recommendation for pt to continue with Myoview. Pt's wife thanked me for the call.

## 2017-09-21 ENCOUNTER — Ambulatory Visit (HOSPITAL_COMMUNITY): Payer: PPO | Attending: Cardiology

## 2017-09-21 DIAGNOSIS — R0609 Other forms of dyspnea: Secondary | ICD-10-CM | POA: Diagnosis not present

## 2017-09-21 MED ORDER — TECHNETIUM TC 99M TETROFOSMIN IV KIT
33.0000 | PACK | Freq: Once | INTRAVENOUS | Status: AC | PRN
  Administered 2017-09-21: 33 via INTRAVENOUS
  Filled 2017-09-21: qty 33

## 2017-09-21 MED ORDER — REGADENOSON 0.4 MG/5ML IV SOLN
0.4000 mg | Freq: Once | INTRAVENOUS | Status: AC
  Administered 2017-09-21: 0.4 mg via INTRAVENOUS

## 2017-09-22 ENCOUNTER — Ambulatory Visit (HOSPITAL_COMMUNITY): Payer: PPO | Attending: Cardiovascular Disease

## 2017-09-22 LAB — MYOCARDIAL PERFUSION IMAGING
CHL CUP NUCLEAR SSS: 9
CHL CUP RESTING HR STRESS: 82 {beats}/min
CSEPPHR: 100 {beats}/min
LV dias vol: 107 mL (ref 62–150)
LVSYSVOL: 38 mL
NUC STRESS TID: 0.92
RATE: 0.34
SDS: 2
SRS: 7

## 2017-09-22 MED ORDER — TECHNETIUM TC 99M TETROFOSMIN IV KIT
32.0000 | PACK | Freq: Once | INTRAVENOUS | Status: AC | PRN
  Administered 2017-09-22: 32 via INTRAVENOUS
  Filled 2017-09-22: qty 32

## 2017-09-23 ENCOUNTER — Ambulatory Visit (HOSPITAL_COMMUNITY): Payer: PPO | Admitting: Physical Therapy

## 2017-09-23 DIAGNOSIS — M5416 Radiculopathy, lumbar region: Secondary | ICD-10-CM | POA: Diagnosis not present

## 2017-09-23 DIAGNOSIS — R262 Difficulty in walking, not elsewhere classified: Secondary | ICD-10-CM

## 2017-09-23 NOTE — Therapy (Signed)
[place], [place], [address] Phone: [phone]   Fax:  [phone]  Physical Therapy Treatment  Patient Details  Name: Steven [name] MRN: [id number] Date of Birth: 06-24-1940 Referring Provider: Sallee Lange    Encounter Date: 09/23/2017  PT End of Session - 09/23/17 0916    Visit Number  3    Number of Visits  8    Date for PT Re-Evaluation  10/14/17    PT Start Time  0905    PT Stop Time  0945    PT Time Calculation (min)  40 min    Activity Tolerance  Patient tolerated treatment well    Behavior During Therapy  [facility] for tasks assessed/performed       Past Medical History:  Diagnosis Date  . Chronic pain   . Complication of anesthesia    pt woke up during last surgery   . Coronary artery disease 02-27-11   Dr. Jacquiline Doe follows-has some blocked coronary arteries ,not suitable for stent  placement  . Dyslipidemia   . Dysrhythmia    hx of atrial fib during last hospitalization   . GERD (gastroesophageal reflux disease) 02-27-11   Acid reflux  . Hearing loss 02-27-11   Bilateral hearing aids-due to exposure to loud machinery  . Hemorrhoids 02-27-11   not bothersome at this time  . Hyperlipidemia   . Hypertension   . Leg swelling   . Obesity   . Osteoarthritis 02-27-11   Ostearthritis-knees, shoulders.Back causes chronic pain-radiates down right leg  . Rash   . Seizures ([place]) 07/2014  . Shingles outbreak 02-27-11   2 weeks ago , was tx.-only residual is tenderness of right scalp-no open areas  . Shortness of breath    with exertion   . Urinary incontinence 02-27-11   not an everday occurrence-no special measures    Past Surgical History:  Procedure Laterality Date  . CHOLECYSTECTOMY  03/14/2011   Procedure: LAPAROSCOPIC CHOLECYSTECTOMY;  Surgeon: Edward Jolly, MD;  Location: [facility];  Service: General;  Laterality: N/A;  . COLONOSCOPY    . INJECTION KNEE  03/05/2011   Procedure: KNEE INJECTION;  Surgeon: Gearlean Alf;  Location: [facility];  Service: Orthopedics;  Laterality: Left;  80 mg depomedrol  . LAPAROSCOPY  03/16/2011   Procedure: LAPAROSCOPY DIAGNOSTIC;  Surgeon: Judieth Keens, DO;  Location: [facility];  Service: General;  Laterality: N/A;  repair incisional hernia  . REPLACEMENT TOTAL KNEE  02-27-11   right- 2007  . TOTAL KNEE ARTHROPLASTY  03/01/2012   Procedure: TOTAL KNEE ARTHROPLASTY;  Surgeon: Gearlean Alf, MD;  [facility];  Service: Orthopedics;  Laterality: Left;  . TOTAL KNEE REVISION  03/05/2011   Procedure: TOTAL KNEE REVISION;  Surgeon: Dione Plover Aluisio;  [facility]: [facility];  Service: Orthopedics;  Laterality: Right;    There were no vitals filed for this visit.  Subjective Assessment - 09/23/17 0904    Subjective  Pt mowed his yard yesterday so his back is sore.  States that he has not been doing his exercises like he should be.     Pertinent History  HTN, B TKR    Limitations  Sitting;Lifting;Standing;Walking;House hold activities    How long can you sit comfortably?  Pt is able to sit for an hour prior to having to get up but will be bothering him before.     How long can you stand comfortably?  PT right leg becomes numb  after 10-15 mintues.    How long can you walk comfortably?  walks very little do to lifestyle     Patient Stated Goals  sleep better wakes up 5 x a night, sit longer in comfort, less pain , walk longer     Currently in Pain?  Yes    Pain Score  4     Pain Location  Back    Pain Orientation  Lower    Pain Descriptors / Indicators  Aching;Sore    Pain Radiating Towards  none    Pain Onset  More than a month ago    Aggravating Factors   mowing the yard     Pain Relieving Factors  rest     Effect of Pain on Daily Activities  limits    Multiple Pain Sites  Yes    Pain Score  5    Pain Location  Knee    Pain Orientation  Right    Pain Descriptors / Indicators  Aching    Pain Type  Chronic pain    Pain  Onset  More than a month ago    Pain Frequency  Intermittent    Aggravating Factors   weather changes     Pain Relieving Factors  CBD oil    Effect of Pain on Daily Activities  limits                        [facility] Adult PT Treatment/Exercise - 09/23/17 0001      Posture/Postural Control   Posture/Postural Control  --    Postural Limitations  --    Posture Comments  --      Exercises   Exercises  Lumbar      Lumbar Exercises: Stretches   Active Hamstring Stretch  Right;3 reps;30 seconds    Single Knee to Chest Stretch  Right;3 reps;30 seconds    Lower Trunk Rotation  5 reps    Standing Extension  10 reps      Lumbar Exercises: Standing   Other Standing Lumbar Exercises  wall arch x 10    Other Standing Lumbar Exercises  v lift off x 5; tandem stance x 3 with head turns R       Lumbar Exercises: Supine   Ab Set  --    Bent Knee Raise  10 reps    Dead Bug  5 reps    Bridge  10 reps    Other Supine Lumbar Exercises  --             PT Education - 09/23/17 0921    Education Details  updated HEP    Person(s) Educated  Patient    Methods  Explanation    Comprehension  Verbalized understanding       PT Short Term Goals - 09/23/17 0945      PT SHORT TERM GOAL #1   Title  PT to only feel pain radiating to mid thigh level to demonstrate decreased nerve irritation     Time  2    Period  Weeks    Status  Achieved      PT SHORT TERM GOAL #2   Title  Pt pain to be no greater than a 5/10 to allow pt to sit for an hour in comfort.     Time  2    Period  Weeks    Status  On-going      PT SHORT TERM GOAL #3  Title  PT to be waking up at night 4 or less times a night     Time  2    Period  Weeks    Status  On-going      PT SHORT TERM GOAL #4   Title  Pt to be able to walk for 15 mintues in comfort     Time  2    Status  On-going      PT SHORT TERM GOAL #5   Title  Pt to be able to stand for 15 mintues in comfort to complete self grooming  activities.     Time  2    Period  Weeks    Status  New        PT Long Term Goals - 09/23/17 0945      PT LONG TERM GOAL #1   Title  Pt to have no radiating symptoms down his right leg     Time  4    Period  Weeks    Status  On-going      PT LONG TERM GOAL #2   Title  Pt to be I in an advance HEP to continue to progress his functioning ability following therapy.     Time  4    Period  Weeks    Status  On-going      PT LONG TERM GOAL #3   Title  Pt to be able to sit for an hour and a half for traveling and being comfortable in bible study     Time  4    Period  Weeks    Status  On-going      PT LONG TERM GOAL #4   Title  Pt to be able to single leg stance for 10 seconds on both LE to reduce risk of falling.     Time  2    Period  Weeks    Status  On-going      PT LONG TERM GOAL #5   Title  Pt strength in B gluteal maximus to be increased one grade to allow pt to come from sit to stand without having to brace hands on his knees.     Time  4    Period  Weeks    Status  On-going      PT LONG TERM GOAL #6   Title  Pt pain level to be no greater than a 3/10 with medication     Time  4    Period  Weeks    Status  On-going            Plan - 09/23/17 0946    Clinical Impression Statement  Added bent knee raise, dead bug and tandem stance to improve core strength with vebal cuing needed for proper technique.  Therapist stressed the importance of completing as least 15 minutes of exercises a day at home to see results in pt back pain.      Rehab Potential  Good    PT Frequency  2x / week    PT Duration  4 weeks    PT Treatment/Interventions  ADLs/Self Care Home Management;Functional mobility training;Therapeutic activities;Therapeutic exercise;Balance training;Patient/family education;Manual techniques;Passive range of motion    PT Next Visit Plan  Begin heel lift, mini squat and sit to stand from higher leves.     PT Home Exercise Plan  eval:  ab set, bridge, active  hamstring stretch and knee to chest  Patient will benefit from skilled therapeutic intervention in order to improve the following deficits and impairments:  Cardiopulmonary status limiting activity, Decreased activity tolerance, Decreased balance, Decreased range of motion, Decreased strength, Difficulty walking, Impaired flexibility, Postural dysfunction, Pain, Obesity  Visit Diagnosis: Radiculopathy, lumbar region  Difficulty in walking     Problem List Patient Active Problem List   Diagnosis Date Noted  . Complex partial seizure evolving to generalized seizure ([place]) 08/13/2015  . New onset seizure ([place]) 07/19/2015  . Seizure ([place]) 07/19/2015  . Obstructive sleep apnea 10/07/2014  . Morbid obesity ([place]) 05/23/2014  . Prediabetes 01/30/2014  . Chronic back pain 11/28/2013  . Chronic, continuous use of opioids 11/28/2013  . Postop Acute blood loss anemia 03/15/2012  . OA (osteoarthritis) of knee 03/01/2012  . Hypokalemia 03/19/2011  . Persistent atrial fibrillation ([place]) 03/17/2011  . HTN (hypertension) 03/12/2011  . Hypercholesteremia 03/12/2011  . Chronic knee pain 03/12/2011  . Postop Hyponatremia 03/12/2011  . Acute cholecystitis 03/12/2011  . Anemia 03/12/2011  . CORONARY ATHEROSCLEROSIS NATIVE CORONARY ARTERY 03/20/2010  . OTHER DYSPNEA AND RESPIRATORY ABNORMALITIES 02/08/2010  . ABNORMAL ELECTROCARDIOGRAM 02/08/2010   Rayetta Humphrey, PT CLT [phone] 09/23/2017, 9:48 AM  [facility] [address] [place], [place], [address] Phone: [phone]   Fax:  [phone]  Name: Steven [name] MRN: [id number] Date of Birth: 24-Mar-1941

## 2017-09-23 NOTE — Patient Instructions (Addendum)
Bent Leg Lift (Hook-Lying)    Tighten stomach and slowly raise right leg ___3_ inches from floor. Keep trunk rigid. Hold ___5_ seconds. Repeat _10___ times per set. Do _1___ sets per session. Do __2__ sessions per day.  http://orth.exer.us/1090   Copyright  VHI. All rights reserved.  Combination (Hook-Lying)    Tighten stomach and slowly raise left leg and lower opposite arm over head. Keep trunk rigid. Repeat __10__ times per set. Do __1__ sets per session. Do _2___ sessions per day.  http://orth.exer.us/1092   Copyright  VHI. All rights reserved.  Lumbar Rotation (Non-Weight Bearing)    Feet on floor, slowly rock knees from side to side in small, pain-free range of motion. Allow lower back to rotate slightly. Repeat __10__ times per set. Do _1___ sets per session. Do 2____ sessions per day.  http://orth.exer.us/160   Copyright  VHI. All rights reserved.

## 2017-09-25 ENCOUNTER — Ambulatory Visit (HOSPITAL_COMMUNITY): Payer: PPO

## 2017-09-25 ENCOUNTER — Ambulatory Visit: Payer: PPO | Admitting: Family Medicine

## 2017-09-25 ENCOUNTER — Encounter (HOSPITAL_COMMUNITY): Payer: Self-pay

## 2017-09-25 DIAGNOSIS — M5416 Radiculopathy, lumbar region: Secondary | ICD-10-CM | POA: Diagnosis not present

## 2017-09-25 DIAGNOSIS — R262 Difficulty in walking, not elsewhere classified: Secondary | ICD-10-CM

## 2017-09-25 NOTE — Therapy (Addendum)
[facility] [address] [place], [place], [address] Phone: [phone]   Fax:  [phone]  Physical Therapy Treatment  Patient Details  Name: Steven [name] MRN: [id number] Date of Birth: Jul 17, 1940 Referring Provider: Sallee Lange    Encounter Date: 09/25/2017  PT End of Session - 09/25/17 1526    Visit Number  4    Number of Visits  8    Date for PT Re-Evaluation  10/12/17    Authorization Type  [employer]    Authorization Time Period  6/3-->10/12/17    Authorization - Visit Number  4    Authorization - Number of Visits  8    PT Start Time  1520    PT Stop Time  1558    PT Time Calculation (min)  38 min    Activity Tolerance  Patient tolerated treatment well    Behavior During Therapy  [facility] for tasks assessed/performed       Past Medical History:  Diagnosis Date  . Chronic pain   . Complication of anesthesia    pt woke up during last surgery   . Coronary artery disease 02-27-11   Dr. Jacquiline Doe follows-has some blocked coronary arteries ,not suitable for stent  placement  . Dyslipidemia   . Dysrhythmia    hx of atrial fib during last hospitalization   . GERD (gastroesophageal reflux disease) 02-27-11   Acid reflux  . Hearing loss 02-27-11   Bilateral hearing aids-due to exposure to loud machinery  . Hemorrhoids 02-27-11   not bothersome at this time  . Hyperlipidemia   . Hypertension   . Leg swelling   . Obesity   . Osteoarthritis 02-27-11   Ostearthritis-knees, shoulders.Back causes chronic pain-radiates down right leg  . Rash   . Seizures ([place]) 07/2014  . Shingles outbreak 02-27-11   2 weeks ago , was tx.-only residual is tenderness of right scalp-no open areas  . Shortness of breath    with exertion   . Urinary incontinence 02-27-11   not an everday occurrence-no special measures    Past Surgical History:  Procedure Laterality Date  . CHOLECYSTECTOMY  03/14/2011   Procedure: LAPAROSCOPIC  CHOLECYSTECTOMY;  Surgeon: Edward Jolly, MD;  [facility];  Service: General;  Laterality: N/A;  . COLONOSCOPY    . INJECTION KNEE  03/05/2011   Procedure: KNEE INJECTION;  Surgeon: Gearlean Alf;  Location: [facility];  Service: Orthopedics;  Laterality: Left;  80 mg depomedrol  . LAPAROSCOPY  03/16/2011   Procedure: LAPAROSCOPY DIAGNOSTIC;  Surgeon: Judieth Keens, DO;  Location[facility];  Service: General;  Laterality: N/A;  repair incisional hernia  . REPLACEMENT TOTAL KNEE  02-27-11   right- 2007  . TOTAL KNEE ARTHROPLASTY  03/01/2012   Procedure: TOTAL KNEE ARTHROPLASTY;  Surgeon: Gearlean Alf, MD;  [facility];  Service: Orthopedics;  Laterality: Left;  . TOTAL KNEE REVISION  03/05/2011   Procedure: TOTAL KNEE REVISION;  Surgeon: Dione Plover Aluisio;  [facility];  Service: Orthopedics;  Laterality: Right;    There were no vitals filed for this visit.  Subjective Assessment - 09/25/17 1521    Subjective  Pt stated he has not been completing exercises at home, has had a lot going on with 5 apts this week and stuff going around home.  Current pain scale 4/10 LBP and Rt knee pain.    Currently in Pain?  Yes    Pain Score  4  Pain Location  Back Rt knee pain 4/10    Pain Orientation  Lower    Pain Descriptors / Indicators  Sore;Aching    Pain Type  Chronic pain    Pain Onset  More than a month ago    Pain Frequency  Intermittent    Aggravating Factors   mowing the yard    Pain Relieving Factors  rest    Effect of Pain on Daily Activities  limits    Pain Score  4    Pain Location  Knee    Pain Orientation  Right    Pain Descriptors / Indicators  Sore    Pain Type  Chronic pain    Pain Onset  More than a month ago    Pain Frequency  Intermittent    Aggravating Factors   weather changes    Pain Relieving Factors  CBD oil    Effect of Pain on Daily Activities  limits                       [facility] Adult PT Treatment/Exercise - 09/25/17 0001       Lumbar Exercises: Stretches   Active Hamstring Stretch  Right;3 reps;30 seconds supine with towel    Single Knee to Chest Stretch  Right;3 reps;30 seconds towel assistance    Lower Trunk Rotation  5 reps      Lumbar Exercises: Standing   Heel Raises  10 reps;Limitations    Heel Raises Limitations  Toe raises 10x    Functional Squats  10 reps;Limitations    Functional Squats Limitations  minisquat front of mat; cueing for mechanics    Other Standing Lumbar Exercises  wall arch x 10    Other Standing Lumbar Exercises  v lift off x 5; tandem stance x 3 with head turns R       Lumbar Exercises: Seated   Sit to Stand  10 reps 20in mat height       Lumbar Exercises: Supine   Dead Bug  10 reps    Bridge  10 reps               PT Short Term Goals - 09/23/17 0945      PT SHORT TERM GOAL #1   Title  PT to only feel pain radiating to mid thigh level to demonstrate decreased nerve irritation     Time  2    Period  Weeks    Status  Achieved      PT SHORT TERM GOAL #2   Title  Pt pain to be no greater than a 5/10 to allow pt to sit for an hour in comfort.     Time  2    Period  Weeks    Status  On-going      PT SHORT TERM GOAL #3   Title  PT to be waking up at night 4 or less times a night     Time  2    Period  Weeks    Status  On-going      PT SHORT TERM GOAL #4   Title  Pt to be able to walk for 15 mintues in comfort     Time  2    Status  On-going      PT SHORT TERM GOAL #5   Title  Pt to be able to stand for 15 mintues in comfort to complete self grooming activities.     Time  2    Period  Weeks    Status  New        PT Long Term Goals - 09/23/17 0945      PT LONG TERM GOAL #1   Title  Pt to have no radiating symptoms down his right leg     Time  4    Period  Weeks    Status  On-going      PT LONG TERM GOAL #2   Title  Pt to be I in an advance HEP to continue to progress his functioning ability following therapy.     Time  4    Period  Weeks     Status  On-going      PT LONG TERM GOAL #3   Title  Pt to be able to sit for an hour and a half for traveling and being comfortable in bible study     Time  4    Period  Weeks    Status  On-going      PT LONG TERM GOAL #4   Title  Pt to be able to single leg stance for 10 seconds on both LE to reduce risk of falling.     Time  2    Period  Weeks    Status  On-going      PT LONG TERM GOAL #5   Title  Pt strength in B gluteal maximus to be increased one grade to allow pt to come from sit to stand without having to brace hands on his knees.     Time  4    Period  Weeks    Status  On-going      PT LONG TERM GOAL #6   Title  Pt pain level to be no greater than a 3/10 with medication     Time  4    Period  Weeks    Status  On-going            Plan - 09/25/17 1546    Clinical Impression Statement  Reviewed importance of compliance with HEP for maximal benefits.  Added STS, minisquats and heel/toe raises for functional strenghtening.  Cueing for mechanics wiht squats with visual, verbal and tactile cueing infront of mat.  No reports of pain through session.      Rehab Potential  Good    PT Frequency  2x / week    PT Duration  4 weeks    PT Treatment/Interventions  ADLs/Self Care Home Management;Functional mobility training;Therapeutic activities;Therapeutic exercise;Balance training;Patient/family education;Manual techniques;Passive range of motion    PT Next Visit Plan  Add lunges with ab set and SLS    PT Home Exercise Plan  eval:  ab set, bridge, active hamstring stretch and knee to chest       Patient will benefit from skilled therapeutic intervention in order to improve the following deficits and impairments:  Cardiopulmonary status limiting activity, Decreased activity tolerance, Decreased balance, Decreased range of motion, Decreased strength, Difficulty walking, Impaired flexibility, Postural dysfunction, Pain, Obesity  Visit Diagnosis: Radiculopathy, lumbar  region  Difficulty in walking     Problem List Patient Active Problem List   Diagnosis Date Noted  . Complex partial seizure evolving to generalized seizure ([place]) 08/13/2015  . New onset seizure ([place]) 07/19/2015  . Seizure ([place]) 07/19/2015  . Obstructive sleep apnea 10/07/2014  . Morbid obesity ([place]) 05/23/2014  . Prediabetes 01/30/2014  . Chronic back pain 11/28/2013  . Chronic, continuous use of opioids  11/28/2013  . Postop Acute blood loss anemia 03/15/2012  . OA (osteoarthritis) of knee 03/01/2012  . Hypokalemia 03/19/2011  . Persistent atrial fibrillation ([place]) 03/17/2011  . HTN (hypertension) 03/12/2011  . Hypercholesteremia 03/12/2011  . Chronic knee pain 03/12/2011  . Postop Hyponatremia 03/12/2011  . Acute cholecystitis 03/12/2011  . Anemia 03/12/2011  . CORONARY ATHEROSCLEROSIS NATIVE CORONARY ARTERY 03/20/2010  . OTHER DYSPNEA AND RESPIRATORY ABNORMALITIES 02/08/2010  . ABNORMAL ELECTROCARDIOGRAM 02/08/2010   Ihor Austin, [place]; [place]  Aldona Lento 09/25/2017, 4:01 PM  [facility] [address] [place], [place], [address] Phone: [phone]   Fax:  [phone]  Name: ZANNIE RUNKLE MRN: [id number] Date of Birth: 1940-09-07

## 2017-09-28 ENCOUNTER — Encounter (HOSPITAL_COMMUNITY): Payer: Self-pay | Admitting: Physical Therapy

## 2017-09-28 ENCOUNTER — Telehealth (HOSPITAL_COMMUNITY): Payer: Self-pay | Admitting: Physical Therapy

## 2017-09-28 ENCOUNTER — Ambulatory Visit (HOSPITAL_COMMUNITY): Payer: PPO | Admitting: Physical Therapy

## 2017-09-28 DIAGNOSIS — M5416 Radiculopathy, lumbar region: Secondary | ICD-10-CM | POA: Diagnosis not present

## 2017-09-28 DIAGNOSIS — R262 Difficulty in walking, not elsewhere classified: Secondary | ICD-10-CM

## 2017-09-28 NOTE — Telephone Encounter (Signed)
moved to 3:15 without his knowledge. Pt was moved my MM with no reason given or note in chart.

## 2017-09-28 NOTE — Therapy (Signed)
[facility] [address] [place], [place], [address] Phone: [phone]   Fax:  [phone]  Physical Therapy Treatment  Patient Details  Name: Steven [name] MRN: [id number] Date of Birth: 07-Apr-1941 Referring Provider: Sallee Lange    Encounter Date: 09/28/2017  PT End of Session - 09/28/17 0930    Visit Number  5    Number of Visits  8    Date for PT Re-Evaluation  10/12/17    Authorization Type  [employer]    Authorization Time Period  6/3-->10/12/17    Authorization - Visit Number  5    Authorization - Number of Visits  8    PT Start Time  0905    PT Stop Time  0945    PT Time Calculation (min)  40 min    Activity Tolerance  Patient tolerated treatment well    Behavior During Therapy  [facility] for tasks assessed/performed       Past Medical History:  Diagnosis Date  . Chronic pain   . Complication of anesthesia    pt woke up during last surgery   . Coronary artery disease 02-27-11   Dr. Jacquiline Doe follows-has some blocked coronary arteries ,not suitable for stent  placement  . Dyslipidemia   . Dysrhythmia    hx of atrial fib during last hospitalization   . GERD (gastroesophageal reflux disease) 02-27-11   Acid reflux  . Hearing loss 02-27-11   Bilateral hearing aids-due to exposure to loud machinery  . Hemorrhoids 02-27-11   not bothersome at this time  . Hyperlipidemia   . Hypertension   . Leg swelling   . Obesity   . Osteoarthritis 02-27-11   Ostearthritis-knees, shoulders.Back causes chronic pain-radiates down right leg  . Rash   . Seizures ([place]) 07/2014  . Shingles outbreak 02-27-11   2 weeks ago , was tx.-only residual is tenderness of right scalp-no open areas  . Shortness of breath    with exertion   . Urinary incontinence 02-27-11   not an everday occurrence-no special measures    Past Surgical History:  Procedure Laterality Date  . CHOLECYSTECTOMY  03/14/2011   Procedure: LAPAROSCOPIC  CHOLECYSTECTOMY;  Surgeon: Edward Jolly, MD;  Location: [facility];  Service: General;  Laterality: N/A;  . COLONOSCOPY    . INJECTION KNEE  03/05/2011   Procedure: KNEE INJECTION;  Surgeon: Gearlean Alf;  Location: [facility];  Service: Orthopedics;  Laterality: Left;  80 mg depomedrol  . LAPAROSCOPY  03/16/2011   Procedure: LAPAROSCOPY DIAGNOSTIC;  Surgeon: Judieth Keens, DO;  Location: [facility];  Service: General;  Laterality: N/A;  repair incisional hernia  . REPLACEMENT TOTAL KNEE  02-27-11   right- 2007  . TOTAL KNEE ARTHROPLASTY  03/01/2012   Procedure: TOTAL KNEE ARTHROPLASTY;  Surgeon: Gearlean Alf, MD;  Location: [facility];  Service: Orthopedics;  Laterality: Left;  . TOTAL KNEE REVISION  03/05/2011   Procedure: TOTAL KNEE REVISION;  Surgeon: Dione Plover Aluisio;  Location: [facility];  Service: Orthopedics;  Laterality: Right;    There were no vitals filed for this visit.  Subjective Assessment - 09/28/17 0905    Subjective  Pt states that he raked hay and mowed his yard. He states that he did not hurt while he did it but it is after he gets done     Pertinent History  HTN, B TKR    Limitations  Sitting;Lifting;Standing;Walking;House hold activities    How long can  you sit comfortably?  Pt is able to sit for an hour prior to having to get up but will be bothering him before.     How long can you stand comfortably?  PT right leg becomes numb after 10-15 mintues.    How long can you walk comfortably?  walks very little do to lifestyle     Patient Stated Goals  sleep better wakes up 5 x a night, sit longer in comfort, less pain , walk longer     Currently in Pain?  Yes    Pain Score  4     Pain Location  Knee    Pain Orientation  Right    Pain Descriptors / Indicators  Aching    Pain Type  Chronic pain    Pain Onset  More than a month ago    Pain Frequency  Intermittent    Multiple Pain Sites  No    Pain Onset  More than a month ago                       [facility]  Adult PT Treatment/Exercise - 09/28/17 0001      Exercises   Exercises  Lumbar      Lumbar Exercises: Stretches   Active Hamstring Stretch  Right;3 reps;30 seconds supine with towel    Single Knee to Chest Stretch  Right;3 reps;30 seconds towel assistance    Standing Extension  10 reps      Lumbar Exercises: Standing   Other Standing Lumbar Exercises  wall arch x 10    Other Standing Lumbar Exercises  v lift off x 10; tandem stance x 3 with head turns R       Lumbar Exercises: Seated   Sit to Stand  10 reps 20in mat height       Lumbar Exercises: Supine   Dead Bug  10 reps    Bridge  15 reps    Other Supine Lumbar Exercises  stretch Rt arm and leg out; then LT x 5     Other Supine Lumbar Exercises  stretch arms and legs as far as possible towards opposite walls x 5       Lumbar Exercises: Prone   Straight Leg Raise  5 reps with knee bent               PT Short Term Goals - 09/23/17 0945      PT SHORT TERM GOAL #1   Title  PT to only feel pain radiating to mid thigh level to demonstrate decreased nerve irritation     Time  2    Period  Weeks    Status  Achieved      PT SHORT TERM GOAL #2   Title  Pt pain to be no greater than a 5/10 to allow pt to sit for an hour in comfort.     Time  2    Period  Weeks    Status  On-going      PT SHORT TERM GOAL #3   Title  PT to be waking up at night 4 or less times a night     Time  2    Period  Weeks    Status  On-going      PT SHORT TERM GOAL #4   Title  Pt to be able to walk for 15 mintues in comfort     Time  2    Status  On-going  PT SHORT TERM GOAL #5   Title  Pt to be able to stand for 15 mintues in comfort to complete self grooming activities.     Time  2    Period  Weeks    Status  New        PT Long Term Goals - 09/23/17 0945      PT LONG TERM GOAL #1   Title  Pt to have no radiating symptoms down his right leg     Time  4    Period  Weeks    Status  On-going      PT LONG TERM GOAL #2    Title  Pt to be I in an advance HEP to continue to progress his functioning ability following therapy.     Time  4    Period  Weeks    Status  On-going      PT LONG TERM GOAL #3   Title  Pt to be able to sit for an hour and a half for traveling and being comfortable in bible study     Time  4    Period  Weeks    Status  On-going      PT LONG TERM GOAL #4   Title  Pt to be able to single leg stance for 10 seconds on both LE to reduce risk of falling.     Time  2    Period  Weeks    Status  On-going      PT LONG TERM GOAL #5   Title  Pt strength in B gluteal maximus to be increased one grade to allow pt to come from sit to stand without having to brace hands on his knees.     Time  4    Period  Weeks    Status  On-going      PT LONG TERM GOAL #6   Title  Pt pain level to be no greater than a 3/10 with medication     Time  4    Period  Weeks    Status  On-going            Plan - 09/28/17 0930    Clinical Impression Statement  Added prone hip extension.  Pt unable to completed correctly without allowing knee flexion.  Pt needed verbal cuiing to keep back stabilized with dead bug exercise.   Added supine stretches to decompress spine.     Rehab Potential  Good    PT Frequency  2x / week    PT Duration  4 weeks    PT Treatment/Interventions  ADLs/Self Care Home Management;Functional mobility training;Therapeutic activities;Therapeutic exercise;Balance training;Patient/family education;Manual techniques;Passive range of motion    PT Next Visit Plan  Begin standing t band hip extension ;attempt prone SLR without bending knee     PT Home Exercise Plan  eval:  ab set, bridge, active hamstring stretch and knee to chest       Patient will benefit from skilled therapeutic intervention in order to improve the following deficits and impairments:  Cardiopulmonary status limiting activity, Decreased activity tolerance, Decreased balance, Decreased range of motion, Decreased strength,  Difficulty walking, Impaired flexibility, Postural dysfunction, Pain, Obesity  Visit Diagnosis: Radiculopathy, lumbar region  Difficulty in walking     Problem List Patient Active Problem List   Diagnosis Date Noted  . Complex partial seizure evolving to generalized seizure ([place]) 08/13/2015  . New onset seizure ([place]) 07/19/2015  . Seizure ([place])  07/19/2015  . Obstructive sleep apnea 10/07/2014  . Morbid obesity ([place]) 05/23/2014  . Prediabetes 01/30/2014  . Chronic back pain 11/28/2013  . Chronic, continuous use of opioids 11/28/2013  . Postop Acute blood loss anemia 03/15/2012  . OA (osteoarthritis) of knee 03/01/2012  . Hypokalemia 03/19/2011  . Persistent atrial fibrillation ([place]) 03/17/2011  . HTN (hypertension) 03/12/2011  . Hypercholesteremia 03/12/2011  . Chronic knee pain 03/12/2011  . Postop Hyponatremia 03/12/2011  . Acute cholecystitis 03/12/2011  . Anemia 03/12/2011  . CORONARY ATHEROSCLEROSIS NATIVE CORONARY ARTERY 03/20/2010  . OTHER DYSPNEA AND RESPIRATORY ABNORMALITIES 02/08/2010  . ABNORMAL ELECTROCARDIOGRAM 02/08/2010    Rayetta Humphrey, PT CLT [phone] 09/28/2017, 9:50 AM  [facility] [address] [place], [place], [address] Phone: [phone]   Fax:  [phone]  Name: Steven [name] MRN: [id number] Date of Birth: [birth year]

## 2017-09-30 ENCOUNTER — Telehealth (HOSPITAL_COMMUNITY): Payer: Self-pay | Admitting: Physical Therapy

## 2017-09-30 ENCOUNTER — Ambulatory Visit (HOSPITAL_COMMUNITY): Payer: PPO | Admitting: Physical Therapy

## 2017-09-30 DIAGNOSIS — M5416 Radiculopathy, lumbar region: Secondary | ICD-10-CM | POA: Diagnosis not present

## 2017-09-30 DIAGNOSIS — R262 Difficulty in walking, not elsewhere classified: Secondary | ICD-10-CM

## 2017-09-30 NOTE — Therapy (Signed)
[facility] [address] [name], [place], [address] Phone: [phone]   Fax:  [phone]  Physical Therapy Treatment  Patient Details  Name: Steven [name] MRN: [id number] Date of Birth: [birth year] Referring Provider: Sallee Lange    Encounter Date: 09/30/2017  PT End of Session - 09/30/17 0948    Visit Number  6    Number of Visits  8    Date for PT Re-Evaluation  10/12/17    Authorization Type  [employer]    Authorization Time Period  6/3-->10/12/17    Authorization - Visit Number  6    Authorization - Number of Visits  8    PT Start Time  0900    PT Stop Time  0945    PT Time Calculation (min)  45 min    Activity Tolerance  Patient tolerated treatment well    Behavior During Therapy  [facility] for tasks assessed/performed       Past Medical History:  Diagnosis Date  . Chronic pain   . Complication of anesthesia    pt woke up during last surgery   . Coronary artery disease 02-27-11   Dr. Jacquiline Doe follows-has some blocked coronary arteries ,not suitable for stent  placement  . Dyslipidemia   . Dysrhythmia    hx of atrial fib during last hospitalization   . GERD (gastroesophageal reflux disease) 02-27-11   Acid reflux  . Hearing loss 02-27-11   Bilateral hearing aids-due to exposure to loud machinery  . Hemorrhoids 02-27-11   not bothersome at this time  . Hyperlipidemia   . Hypertension   . Leg swelling   . Obesity   . Osteoarthritis 02-27-11   Ostearthritis-knees, shoulders.Back causes chronic pain-radiates down right leg  . Rash   . Seizures ([place]) 07/2014  . Shingles outbreak 02-27-11   2 weeks ago , was tx.-only residual is tenderness of right scalp-no open areas  . Shortness of breath    with exertion   . Urinary incontinence 02-27-11   not an everday occurrence-no special measures    Past Surgical History:  Procedure Laterality Date  . CHOLECYSTECTOMY  03/14/2011   Procedure: LAPAROSCOPIC  CHOLECYSTECTOMY;  Surgeon: Edward Jolly, MD;  [facility];  Service: General;  Laterality: N/A;  . COLONOSCOPY    . INJECTION KNEE  03/05/2011   Procedure: KNEE INJECTION;  Surgeon: Gearlean Alf;  Location: [facility];  Service: Orthopedics;  Laterality: Left;  80 mg depomedrol  . LAPAROSCOPY  03/16/2011   Procedure: LAPAROSCOPY DIAGNOSTIC;  Surgeon: Judieth Keens, DO;  Location: [facility];  Service: General;  Laterality: N/A;  repair incisional hernia  . REPLACEMENT TOTAL KNEE  02-27-11   right- 2007  . TOTAL KNEE ARTHROPLASTY  03/01/2012   Procedure: TOTAL KNEE ARTHROPLASTY;  Surgeon: Gearlean Alf, MD;  Location: [facility];  Service: Orthopedics;  Laterality: Left;  . TOTAL KNEE REVISION  03/05/2011   Procedure: TOTAL KNEE REVISION;  Surgeon: Dione Plover Aluisio;  Location: [facility];  Service: Orthopedics;  Laterality: Right;    There were no vitals filed for this visit.  Subjective Assessment - 09/30/17 0856    Subjective  Pt states that his hip and knees are sore.  They get sore whenever it is going to rain.      Pertinent History  HTN, B TKR    Limitations  Sitting;Lifting;Standing;Walking;House hold activities    How long can you sit comfortably?  Pt is able to  sit for an hour prior to having to get up but will be bothering him before.     How long can you stand comfortably?  PT right leg becomes numb after 10-15 mintues.    How long can you walk comfortably?  walks very little do to lifestyle     Patient Stated Goals  sleep better wakes up 5 x a night, sit longer in comfort, less pain , walk longer     Currently in Pain?  Yes    Pain Score  3     Pain Location  Back    Pain Orientation  Lower    Pain Descriptors / Indicators  Aching    Pain Type  Chronic pain    Pain Onset  More than a month ago    Pain Frequency  Intermittent    Aggravating Factors   weather/     Multiple Pain Sites  Yes    Pain Score  5    Pain Location  Knee    Pain Orientation  Right    Pain  Descriptors / Indicators  Aching    Pain Type  Chronic pain    Pain Onset  More than a month ago    Aggravating Factors   weight bearing     Pain Relieving Factors  elevation                        [facility] Adult PT Treatment/Exercise - 09/30/17 0001      Exercises   Exercises  Lumbar      Lumbar Exercises: Stretches   Other Lumbar Stretch Exercise  3 D hip excursion       Lumbar Exercises: Standing   Scapular Retraction  Strengthening;10 reps    Theraband Level (Scapular Retraction)  Level 3 (Green)    Row  Strengthening;Both;10 reps;Theraband    Theraband Level (Row)  Level 3 (Green)    Shoulder Extension  Strengthening;Both;10 reps;Theraband    Theraband Level (Shoulder Extension)  Level 3 (Green)    Other Standing Lumbar Exercises  standing hip abduction/extension B with GTB x 10 reps     Other Standing Lumbar Exercises  tandem stance with head turns x 3       Lumbar Exercises: Seated   Sit to Stand  10 reps      Lumbar Exercises: Supine   Bridge  15 reps    Straight Leg Raise  10 reps    Isometric Hip Flexion  5 reps      Lumbar Exercises: Prone   Straight Leg Raise  10 reps             PT Education - 09/30/17 0945    Education Details  new HEP     Person(s) Educated  Patient    Methods  Explanation;Handout    Comprehension  Verbalized understanding;Returned demonstration       PT Short Term Goals - 09/30/17 0948      PT SHORT TERM GOAL #1   Title  PT to only feel pain radiating to mid thigh level to demonstrate decreased nerve irritation     Time  2    Period  Weeks    Status  Achieved      PT SHORT TERM GOAL #2   Title  Pt pain to be no greater than a 5/10 to allow pt to sit for an hour in comfort.     Time  2    Period  Weeks    Status  On-going      PT SHORT TERM GOAL #3   Title  PT to be waking up at night 4 or less times a night     Time  2    Period  Weeks    Status  On-going      PT SHORT TERM GOAL #4   Title  Pt to be  able to walk for 15 mintues in comfort     Time  2    Status  On-going      PT SHORT TERM GOAL #5   Title  Pt to be able to stand for 15 mintues in comfort to complete self grooming activities.     Time  2    Period  Weeks    Status  New        PT Long Term Goals - 09/23/17 0945      PT LONG TERM GOAL #1   Title  Pt to have no radiating symptoms down his right leg     Time  4    Period  Weeks    Status  On-going      PT LONG TERM GOAL #2   Title  Pt to be I in an advance HEP to continue to progress his functioning ability following therapy.     Time  4    Period  Weeks    Status  On-going      PT LONG TERM GOAL #3   Title  Pt to be able to sit for an hour and a half for traveling and being comfortable in bible study     Time  4    Period  Weeks    Status  On-going      PT LONG TERM GOAL #4   Title  Pt to be able to single leg stance for 10 seconds on both LE to reduce risk of falling.     Time  2    Period  Weeks    Status  On-going      PT LONG TERM GOAL #5   Title  Pt strength in B gluteal maximus to be increased one grade to allow pt to come from sit to stand without having to brace hands on his knees.     Time  4    Period  Weeks    Status  On-going      PT LONG TERM GOAL #6   Title  Pt pain level to be no greater than a 3/10 with medication     Time  4    Period  Weeks    Status  On-going            Plan - 09/30/17 0936    Clinical Impression Statement  Progressed stabilization by adding isometric hip flexion, and SLR.  Added postrual Tband exercises to program.  Pt improving on eccentric control with stand to sit exercises.      Rehab Potential  Good    PT Frequency  2x / week    PT Duration  4 weeks    PT Treatment/Interventions  ADLs/Self Care Home Management;Functional mobility training;Therapeutic activities;Therapeutic exercise;Balance training;Patient/family education;Manual techniques;Passive range of motion    PT Home Exercise Plan  eval:   ab set, bridge, active hamstring stretch and knee to chest; 6/18:  added SLR;prone SLR and prone heel squeeze        Patient will benefit from skilled therapeutic intervention in order  to improve the following deficits and impairments:  Cardiopulmonary status limiting activity, Decreased activity tolerance, Decreased balance, Decreased range of motion, Decreased strength, Difficulty walking, Impaired flexibility, Postural dysfunction, Pain, Obesity  Visit Diagnosis: Radiculopathy, lumbar region  Difficulty in walking     Problem List Patient Active Problem List   Diagnosis Date Noted  . Complex partial seizure evolving to generalized seizure ([place]) 08/13/2015  . New onset seizure ([place]) 07/19/2015  . Seizure ([place]) 07/19/2015  . Obstructive sleep apnea 10/07/2014  . Morbid obesity ([place]) 05/23/2014  . Prediabetes 01/30/2014  . Chronic back pain 11/28/2013  . Chronic, continuous use of opioids 11/28/2013  . Postop Acute blood loss anemia 03/15/2012  . OA (osteoarthritis) of knee 03/01/2012  . Hypokalemia 03/19/2011  . Persistent atrial fibrillation ([place]) 03/17/2011  . HTN (hypertension) 03/12/2011  . Hypercholesteremia 03/12/2011  . Chronic knee pain 03/12/2011  . Postop Hyponatremia 03/12/2011  . Acute cholecystitis 03/12/2011  . Anemia 03/12/2011  . CORONARY ATHEROSCLEROSIS NATIVE CORONARY ARTERY 03/20/2010  . OTHER DYSPNEA AND RESPIRATORY ABNORMALITIES 02/08/2010  . ABNORMAL ELECTROCARDIOGRAM 02/08/2010    Rayetta Humphrey, PT CLT [phone] 09/30/2017, 9:50 AM  [facility] [address] [place], [place], [address] Phone: [phone]   Fax:  [phone]  Name: TEJA JUDICE MRN: [id number] Date of Birth: 26-May-1940

## 2017-09-30 NOTE — Telephone Encounter (Signed)
Ask for 2019 [facility] card pt will bring to next apptment. Nf 09/30/17

## 2017-09-30 NOTE — Patient Instructions (Addendum)
Straight Leg Raise    Tighten stomach and slowly raise locked right leg _18___ inches from floor. Repeat _10___ times per set. Do _1___ sets per session. Do _1___ sessions per day.  http://orth.exer.us/1102   Copyright  VHI. All rights reserved.  Straight Leg Raise (Prone)    Abdomen and head supported, keep left knee locked and raise leg at hip. Avoid arching low back. Repeat __10__ times per set. Do ___1_ sets per session. Do ___1_ sessions per day.  http://orth.exer.us/1112   Copyright  VHI. All rights reserved.  Heel Squeeze (Prone)    Abdomen supported, bend knees and gently squeeze heels together. Hold __3__ seconds. Repeat __10__ times per set. Do ___1_ sets per session. Do __1__ sessions per day.  http://orth.exer.us/1080   Copyright  VHI. All rights reserved.

## 2017-10-02 ENCOUNTER — Ambulatory Visit (INDEPENDENT_AMBULATORY_CARE_PROVIDER_SITE_OTHER): Payer: PPO | Admitting: Family Medicine

## 2017-10-02 ENCOUNTER — Encounter: Payer: Self-pay | Admitting: Family Medicine

## 2017-10-02 VITALS — BP 128/82 | Ht 70.0 in | Wt 320.0 lb

## 2017-10-02 DIAGNOSIS — Z79891 Long term (current) use of opiate analgesic: Secondary | ICD-10-CM | POA: Diagnosis not present

## 2017-10-02 DIAGNOSIS — R197 Diarrhea, unspecified: Secondary | ICD-10-CM | POA: Diagnosis not present

## 2017-10-02 MED ORDER — HYDROMORPHONE HCL 2 MG PO TABS: ORAL_TABLET | ORAL | 0 refills | Status: DC

## 2017-10-02 NOTE — Progress Notes (Signed)
   Subjective:    Patient ID: Steven [name], male    DOB: [birth year], 77 y.o.   MRN: [id number]  HPIFollow up on starting cymbalta. Pt states he feels about the same since starting.  This patient relates that he has had some improvement in the depression but in some ways he feels the same he denies being suicidal  Still having loose stools.  This is been going on over the past 3 months and at times is very frequent to the tune of 7/day other times is 3 or 4/day often will start off somewhat soft then become watery denies bloody denies mucus  Follow up on pain med. Pt states his dosage was cut down at last visit.  The patient states that he tried to go down to 4 tablets/day but was unable to tolerate this currently is on 5 tablets a day and this seems to be doing adequate he denies abusing it and does state it helps his overall function   Review of Systems  Constitutional: Negative for activity change, fatigue and fever.  HENT: Negative for congestion and rhinorrhea.   Respiratory: Negative for cough and shortness of breath.   Cardiovascular: Negative for chest pain and leg swelling.  Gastrointestinal: Positive for diarrhea. Negative for abdominal pain and nausea.  Genitourinary: Negative for dysuria and hematuria.  Neurological: Negative for weakness and headaches.  Psychiatric/Behavioral: Negative for behavioral problems.       Objective:   Physical Exam  Constitutional: He appears well-nourished. No distress.  Cardiovascular: Normal rate, regular rhythm and normal heart sounds.  No murmur heard. Pulmonary/Chest: Effort normal and breath sounds normal. No respiratory distress.  Musculoskeletal: He exhibits no edema.  Lymphadenopathy:    He has no cervical adenopathy.  Neurological: He is alert.  Psychiatric: His behavior is normal.  Vitals reviewed.         Assessment & Plan:  Depression-in some ways he is doing better continue the current medication we will not add  additional medicine at this time  Pain control fair patient would prefer to stay on current dosing no more than 5/day this is reasonable  Diarrhea will do lab work to try to see if there is underlying cause may need gastroenterology

## 2017-10-05 ENCOUNTER — Ambulatory Visit (HOSPITAL_COMMUNITY): Payer: PPO

## 2017-10-05 ENCOUNTER — Other Ambulatory Visit: Payer: Self-pay

## 2017-10-05 ENCOUNTER — Encounter (HOSPITAL_COMMUNITY): Payer: Self-pay

## 2017-10-05 DIAGNOSIS — M5416 Radiculopathy, lumbar region: Secondary | ICD-10-CM

## 2017-10-05 DIAGNOSIS — R262 Difficulty in walking, not elsewhere classified: Secondary | ICD-10-CM

## 2017-10-05 NOTE — Therapy (Signed)
[facility] [address] [place], [place], [address] Phone: [phone]   Fax:  [phone]  Physical Therapy Treatment  Patient Details  Name: Steven [name] MRN: [id number] Date of Birth: [birth year] Referring Provider: Sallee Lange    Encounter Date: 10/05/2017  PT End of Session - 10/05/17 0826    Visit Number  7    Number of Visits  8    Date for PT Re-Evaluation  10/12/17    Authorization Type  [employer]    Authorization Time Period  6/3-->10/12/17    Authorization - Visit Number  7    Authorization - Number of Visits  8    PT Start Time  0819    PT Stop Time  0857    PT Time Calculation (min)  38 min    Activity Tolerance  Patient tolerated treatment well    Behavior During Therapy  [facility] for tasks assessed/performed       Past Medical History:  Diagnosis Date  . Chronic pain   . Complication of anesthesia    pt woke up during last surgery   . Coronary artery disease 02-27-11   Dr. Jacquiline Doe follows-has some blocked coronary arteries ,not suitable for stent  placement  . Dyslipidemia   . Dysrhythmia    hx of atrial fib during last hospitalization   . GERD (gastroesophageal reflux disease) 02-27-11   Acid reflux  . Hearing loss 02-27-11   Bilateral hearing aids-due to exposure to loud machinery  . Hemorrhoids 02-27-11   not bothersome at this time  . Hyperlipidemia   . Hypertension   . Leg swelling   . Obesity   . Osteoarthritis 02-27-11   Ostearthritis-knees, shoulders.Back causes chronic pain-radiates down right leg  . Rash   . Seizures ([place]) 07/2014  . Shingles outbreak 02-27-11   2 weeks ago , was tx.-only residual is tenderness of right scalp-no open areas  . Shortness of breath    with exertion   . Urinary incontinence 02-27-11   not an everday occurrence-no special measures    Past Surgical History:  Procedure Laterality Date  . CHOLECYSTECTOMY  03/14/2011   Procedure: LAPAROSCOPIC  CHOLECYSTECTOMY;  Surgeon: Edward Jolly, MD;  Location: [facility];  Service: General;  Laterality: N/A;  . COLONOSCOPY    . INJECTION KNEE  03/05/2011   Procedure: KNEE INJECTION;  Surgeon: Gearlean Alf;  Location: [facility];  Service: Orthopedics;  Laterality: Left;  80 mg depomedrol  . LAPAROSCOPY  03/16/2011   Procedure: LAPAROSCOPY DIAGNOSTIC;  Surgeon: Judieth Keens, DO;  Location: [facility];  Service: General;  Laterality: N/A;  repair incisional hernia  . REPLACEMENT TOTAL KNEE  02-27-11   right- 2007  . TOTAL KNEE ARTHROPLASTY  03/01/2012   Procedure: TOTAL KNEE ARTHROPLASTY;  Surgeon: Gearlean Alf, MD;  Location: [facility];  Service: Orthopedics;  Laterality: Left;  . TOTAL KNEE REVISION  03/05/2011   Procedure: TOTAL KNEE REVISION;  Surgeon: Dione Plover Aluisio;  Location: [facility];  Service: Orthopedics;  Laterality: Right;    There were no vitals filed for this visit.  Subjective Assessment - 10/05/17 0821    Subjective  Patient reports his back is doing ok but his knees are always sore. He reports    Pertinent History  HTN, B TKR    Limitations  Sitting;Lifting;Standing;Walking;House hold activities    How long can you sit comfortably?  Pt is able to sit for an hour prior to  having to get up but will be bothering him before.     How long can you stand comfortably?  PT right leg becomes numb after 10-15 mintues.    How long can you walk comfortably?  walks very little do to lifestyle     Patient Stated Goals  sleep better wakes up 5 x a night, sit longer in comfort, less pain , walk longer     Currently in Pain?  Yes    Pain Score  3     Pain Location  Knee    Pain Orientation  Right;Left    Pain Descriptors / Indicators  Aching;Sore    Pain Type  Chronic pain    Pain Onset  More than a month ago    Pain Frequency  Intermittent    Aggravating Factors   walking, standings    Pain Relieving Factors  rest    Effect of Pain on Daily Activities  some limitations         [facility] Adult PT Treatment/Exercise - 10/05/17 0001      Lumbar Exercises: Stretches   Single Knee to Chest Stretch  Right;3 reps;30 seconds;Left towel      Lumbar Exercises: Standing   Heel Raises  15 reps;3 seconds    Heel Raises Limitations  1x 15 toe raises, 3 seconds    Functional Squats  10 reps;Limitations    Functional Squats Limitations  2 sets, chair taps for minisquats    Scapular Retraction  Strengthening;15 reps    Theraband Level (Scapular Retraction)  Level 3 (Green)    Row  Strengthening;Both;Theraband;15 reps    Theraband Level (Row)  Level 3 (Green)    Shoulder Extension  Strengthening;Both;Theraband;15 reps    Theraband Level (Shoulder Extension)  Level 3 (Green)    Other Standing Lumbar Exercises  standing hip abduction/extension B with GTB x 15 reps     Other Standing Lumbar Exercises  tandem stance on foam 4x 20 seconds alt foot alignment; 2x 10 reps with dowel forward punch, alt foot alignment on foam      Lumbar Exercises: Supine   Bridge  15 reps;3 seconds    Straight Leg Raise  15 reps;3 seconds         PT Education - 10/05/17 0831    Education Details  educated on exercises throughout session and on form for postual theraband exercises    Person(s) Educated  Patient    Methods  Explanation;Demonstration;Verbal cues    Comprehension  Verbalized understanding       PT Short Term Goals - 09/30/17 0948      PT SHORT TERM GOAL #1   Title  PT to only feel pain radiating to mid thigh level to demonstrate decreased nerve irritation     Time  2    Period  Weeks    Status  Achieved      PT SHORT TERM GOAL #2   Title  Pt pain to be no greater than a 5/10 to allow pt to sit for an hour in comfort.     Time  2    Period  Weeks    Status  On-going      PT SHORT TERM GOAL #3   Title  PT to be waking up at night 4 or less times a night     Time  2    Period  Weeks    Status  On-going      PT SHORT TERM GOAL #4  Title  Pt to be able to walk for  15 mintues in comfort     Time  2    Status  On-going      PT SHORT TERM GOAL #5   Title  Pt to be able to stand for 15 mintues in comfort to complete self grooming activities.     Time  2    Period  Weeks    Status  New        PT Long Term Goals - 09/23/17 0945      PT LONG TERM GOAL #1   Title  Pt to have no radiating symptoms down his right leg     Time  4    Period  Weeks    Status  On-going      PT LONG TERM GOAL #2   Title  Pt to be I in an advance HEP to continue to progress his functioning ability following therapy.     Time  4    Period  Weeks    Status  On-going      PT LONG TERM GOAL #3   Title  Pt to be able to sit for an hour and a half for traveling and being comfortable in bible study     Time  4    Period  Weeks    Status  On-going      PT LONG TERM GOAL #4   Title  Pt to be able to single leg stance for 10 seconds on both LE to reduce risk of falling.     Time  2    Period  Weeks    Status  On-going      PT LONG TERM GOAL #5   Title  Pt strength in B gluteal maximus to be increased one grade to allow pt to come from sit to stand without having to brace hands on his knees.     Time  4    Period  Weeks    Status  On-going      PT LONG TERM GOAL #6   Title  Pt pain level to be no greater than a 3/10 with medication     Time  4    Period  Weeks    Status  On-going        Plan - 10/05/17 0827    Clinical Impression Statement  Continued with established POC this session focusing on hip stregnthening and postural exercises. He required verbal/tactile cues for postural scapular retraction with TB but demonstrated good form with rows/shoulder extension. Functioanl squats with eccentric hold above chair performed for eccentric strengthening to improve control during sit to stands and for endurance.     Rehab Potential  Good    PT Frequency  2x / week    PT Duration  4 weeks    PT Treatment/Interventions  ADLs/Self Care Home Management;Functional  mobility training;Therapeutic activities;Therapeutic exercise;Balance training;Patient/family education;Manual techniques;Passive range of motion    PT Next Visit Plan  Re-assess next session and determine readiness for discharge.    PT Home Exercise Plan  eval:  ab set, bridge, active hamstring stretch and knee to chest; 6/18:  added SLR;prone SLR and prone heel squeeze     Consulted and Agree with Plan of Care  Patient       Patient will benefit from skilled therapeutic intervention in order to improve the following deficits and impairments:  Cardiopulmonary status limiting activity, Decreased activity tolerance, Decreased balance, Decreased  range of motion, Decreased strength, Difficulty walking, Impaired flexibility, Postural dysfunction, Pain, Obesity  Visit Diagnosis: Radiculopathy, lumbar region  Difficulty in walking     Problem List Patient Active Problem List   Diagnosis Date Noted  . Complex partial seizure evolving to generalized seizure ([place]) 08/13/2015  . New onset seizure ([place]) 07/19/2015  . Seizure ([place]) 07/19/2015  . Obstructive sleep apnea 10/07/2014  . Morbid obesity ([place]) 05/23/2014  . Prediabetes 01/30/2014  . Chronic back pain 11/28/2013  . Chronic, continuous use of opioids 11/28/2013  . Postop Acute blood loss anemia 03/15/2012  . OA (osteoarthritis) of knee 03/01/2012  . Hypokalemia 03/19/2011  . Persistent atrial fibrillation ([place]) 03/17/2011  . HTN (hypertension) 03/12/2011  . Hypercholesteremia 03/12/2011  . Chronic knee pain 03/12/2011  . Postop Hyponatremia 03/12/2011  . Acute cholecystitis 03/12/2011  . Anemia 03/12/2011  . CORONARY ATHEROSCLEROSIS NATIVE CORONARY ARTERY 03/20/2010  . OTHER DYSPNEA AND RESPIRATORY ABNORMALITIES 02/08/2010  . ABNORMAL ELECTROCARDIOGRAM 02/08/2010    Kipp Brood, PT, DPT Physical Therapist with [facility]  10/05/2017 8:58 AM    [facility] [address] [place], [place], [address] Phone: [phone]   Fax:  [phone]  Name: TAJAH SCHREINER MRN: [id number] Date of Birth: 08/09/1940

## 2017-10-07 ENCOUNTER — Ambulatory Visit (HOSPITAL_COMMUNITY): Payer: PPO | Admitting: Physical Therapy

## 2017-10-07 ENCOUNTER — Encounter (HOSPITAL_COMMUNITY): Payer: Self-pay | Admitting: Physical Therapy

## 2017-10-07 ENCOUNTER — Other Ambulatory Visit: Payer: Self-pay

## 2017-10-07 DIAGNOSIS — R262 Difficulty in walking, not elsewhere classified: Secondary | ICD-10-CM

## 2017-10-07 DIAGNOSIS — M5416 Radiculopathy, lumbar region: Secondary | ICD-10-CM | POA: Diagnosis not present

## 2017-10-07 NOTE — Therapy (Signed)
[facility] [place], [place], [address] Phone: [phone]   Fax:  [phone]  Physical Therapy Treatment/Discharge  Patient Details  Name: Steven [name] MRN: [id number] Date of Birth: 04-Apr-1941 Referring Provider: Sallee Lange    Encounter Date: 10/07/2017  PT End of Session - 10/07/17 0923    Visit Number  8    Number of Visits  8    Date for PT Re-Evaluation  10/12/17    Authorization Type  [employer]    Authorization Time Period  6/3-->10/12/17    Authorization - Visit Number  8    Authorization - Number of Visits  8    PT Start Time  0903    PT Stop Time  0941    PT Time Calculation (min)  38 min    Activity Tolerance  Patient tolerated treatment well    Behavior During Therapy  [facility] for tasks assessed/performed       Past Medical History:  Diagnosis Date  . Chronic pain   . Complication of anesthesia    pt woke up during last surgery   . Coronary artery disease 02-27-11   Dr. Jacquiline Doe follows-has some blocked coronary arteries ,not suitable for stent  placement  . Dyslipidemia   . Dysrhythmia    hx of atrial fib during last hospitalization   . GERD (gastroesophageal reflux disease) 02-27-11   Acid reflux  . Hearing loss 02-27-11   Bilateral hearing aids-due to exposure to loud machinery  . Hemorrhoids 02-27-11   not bothersome at this time  . Hyperlipidemia   . Hypertension   . Leg swelling   . Obesity   . Osteoarthritis 02-27-11   Ostearthritis-knees, shoulders.Back causes chronic pain-radiates down right leg  . Rash   . Seizures ([place]) 07/2014  . Shingles outbreak 02-27-11   2 weeks ago , was tx.-only residual is tenderness of right scalp-no open areas  . Shortness of breath    with exertion   . Urinary incontinence 02-27-11   not an everday occurrence-no special measures    Past Surgical History:  Procedure Laterality Date  . CHOLECYSTECTOMY  03/14/2011   Procedure:  LAPAROSCOPIC CHOLECYSTECTOMY;  Surgeon: Edward Jolly, MD;  [facility]: [facility];  Service: General;  Laterality: N/A;  . COLONOSCOPY    . INJECTION KNEE  03/05/2011   Procedure: KNEE INJECTION;  Surgeon: Gearlean Alf;  Location: [facility];  Service: Orthopedics;  Laterality: Left;  80 mg depomedrol  . LAPAROSCOPY  03/16/2011   Procedure: LAPAROSCOPY DIAGNOSTIC;  Surgeon: Judieth Keens, DO;  Location: [facility];  Service: General;  Laterality: N/A;  repair incisional hernia  . REPLACEMENT TOTAL KNEE  02-27-11   right- 2007  . TOTAL KNEE ARTHROPLASTY  03/01/2012   Procedure: TOTAL KNEE ARTHROPLASTY;  Surgeon: Gearlean Alf, MD;  [facility];  Service: Orthopedics;  Laterality: Left;  . TOTAL KNEE REVISION  03/05/2011   Procedure: TOTAL KNEE REVISION;  Surgeon: Dione Plover Aluisio;  Location: [facility];  Service: Orthopedics;  Laterality: Right;    There were no vitals filed for this visit.  Subjective Assessment - 10/07/17 0903    Subjective  Pt states that he has been doing very little exercise at home.  HIs back is feeling better.     Pertinent History  HTN, B TKR    Limitations  Sitting;Lifting;Standing;Walking;House hold activities    How long can you sit comfortably?  Pt can sit for over an  hour but not sure how long due to not testing.  Was able to sit for an hour prior to having to get up but will be bothering him before.     How long can you stand comfortably?  Pt can stand for 30 minutes now; he has not tested past this; he was right leg becomes numb after 10-15 mintues.    How long can you walk comfortably?  walks very little do to lifestyle he still has not tried to walk.     Patient Stated Goals  sleep better wakes up 5 x a night, (no longer waking up due to his back)., sit longer in comfort, less pain , walk longer     Currently in Pain?  No/denies    Pain Onset  More than a month ago    Pain Onset  More than a month ago         [facility] PT Assessment - 10/07/17 0001       Assessment   Medical Diagnosis  lumbar pain    Referring Provider  Nicki Reaper Luking     Onset Date/Surgical Date  08/12/17    Next MD Visit  01/01/2018    Prior Therapy  no       Precautions   Precautions  None      Restrictions   Weight Bearing Restrictions  No      Balance Screen   Has the patient fallen in the past 6 months  No      [place] residence      Prior Function   Level of Independence  Independent    Vocation  Retired    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       Posture/Postural Control   Posture/Postural Control  Postural limitations    Postural Limitations  Rounded Shoulders;Decreased lumbar lordosis;Decreased thoracic kyphosis;Flexed trunk    Posture Comments  stands 2 degrees forward was  12 degrees forward bent       AROM   Lumbar Flexion  fingers 3" was 4 " from floor reps improved pain     Lumbar Extension  14 was 12 degrees reps feels better       Strength   Strength Assessment Site  -- LE was WNL except glut max which was 3- B; now B 4+/5       Flexibility   Soft Tissue Assessment /Muscle Length  yes    Hamstrings  RT: 150 was 140;  Lt 152 was 150       Ambulation/Gait   Gait Comments  Ambulated in department x 15".  Concentrating on heel toe and erect posture as well as pacing to prevent SOB                   [facility] Adult PT Treatment/Exercise - 10/07/17 0001      Exercises   Exercises  Lumbar      Lumbar Exercises: Stretches   Active Hamstring Stretch  3 reps;30 seconds    Standing Extension  5  reps      Lumbar Exercises: Seated   Sit to Stand  10 reps      Lumbar Exercises: Prone   Straight Leg Raise  15 reps               PT Short Term Goals - 10/07/17 4734      PT SHORT TERM GOAL #1   Title  PT to only feel pain radiating to mid thigh level to demonstrate decreased nerve irritation     Time  2    Period  Weeks    Status  Achieved      PT SHORT TERM GOAL #2   Title  Pt pain to be no greater than a 5/10 to allow pt to sit for an hour in comfort.     Time  2    Period  Weeks    Status  Achieved      PT SHORT TERM GOAL #3   Title  PT to be waking up at night 4 or less times a night     Time  2    Period  Weeks    Status  On-going not waking due to back though       PT SHORT TERM GOAL #4   Title  Pt to be able to walk for 15 mintues in comfort     Time  2    Status  Achieved      PT SHORT TERM GOAL #5   Title  Pt to be able to stand for 15 mintues in comfort to complete self grooming activities.     Time  2    Period  Weeks    Status  Achieved        PT Long Term Goals - 10/07/17 0939      PT LONG TERM GOAL #1   Title  Pt to have no radiating symptoms down his right leg     Time  4    Period  Weeks    Status  Partially Met      PT LONG TERM GOAL #2   Title  Pt to be I in an advance HEP to continue to progress his functioning ability following therapy.     Time  4    Period  Weeks    Status  Partially Met      PT LONG TERM GOAL #3   Title  Pt to be able to sit for an hour and a half for traveling and being comfortable in bible study     Time  4    Period  Weeks    Status  Achieved      PT LONG TERM GOAL #4   Title  Pt to be able to single leg stance for 10 seconds on both LE to reduce risk of falling.     Time  2    Period  Weeks    Status  Partially Met 8 on left ; 10 on right       PT LONG TERM GOAL #5   Title  Pt strength in B gluteal maximus to be increased one grade to allow pt to come from sit to stand without having to brace  hands on his knees.     Time  4    Period  Weeks  Status  Achieved      PT LONG TERM GOAL #6   Title  Pt pain level to be no greater than a 3/10 with medication     Time  4    Period  Weeks    Status  Achieved            Plan - 10/07/17 0957    Clinical Impression Statement  Pt reassessed today with good gains on goals.  Pt encouraged to walk at home for exercises, continue to work on balance and gluteal maximus strength as well as being aware of posture.  Pt no longer in need of skilled physical therapy will be discharged to a HEP>     Rehab Potential  Good    PT Frequency  2x / week    PT Duration  4 weeks    PT Treatment/Interventions  ADLs/Self Care Home Management;Functional mobility training;Therapeutic activities;Therapeutic exercise;Balance training;Patient/family education;Manual techniques;Passive range of motion    PT Next Visit Plan  Discharge to HEP    PT Home Exercise Plan  eval:  ab set, bridge, active hamstring stretch and knee to chest; 6/18:  added SLR;prone SLR and prone heel squeeze     Consulted and Agree with Plan of Care  Patient       Patient will benefit from skilled therapeutic intervention in order to improve the following deficits and impairments:  Cardiopulmonary status limiting activity, Decreased activity tolerance, Decreased balance, Decreased range of motion, Decreased strength, Difficulty walking, Impaired flexibility, Postural dysfunction, Pain, Obesity  Visit Diagnosis: Radiculopathy, lumbar region  Difficulty in walking     Problem List Patient Active Problem List   Diagnosis Date Noted  . Complex partial seizure evolving to generalized seizure ([place]) 08/13/2015  . New onset seizure ([place]) 07/19/2015  . Seizure ([place]) 07/19/2015  . Obstructive sleep apnea 10/07/2014  . Morbid obesity ([place]) 05/23/2014  . Prediabetes 01/30/2014  . Chronic back pain 11/28/2013  . Chronic, continuous use of opioids 11/28/2013  . Postop Acute blood  loss anemia 03/15/2012  . OA (osteoarthritis) of knee 03/01/2012  . Hypokalemia 03/19/2011  . Persistent atrial fibrillation ([place]) 03/17/2011  . HTN (hypertension) 03/12/2011  . Hypercholesteremia 03/12/2011  . Chronic knee pain 03/12/2011  . Postop Hyponatremia 03/12/2011  . Acute cholecystitis 03/12/2011  . Anemia 03/12/2011  . CORONARY ATHEROSCLEROSIS NATIVE CORONARY ARTERY 03/20/2010  . OTHER DYSPNEA AND RESPIRATORY ABNORMALITIES 02/08/2010  . ABNORMAL ELECTROCARDIOGRAM 02/08/2010    Rayetta Humphrey, PT CLT [phone] 10/07/2017, 9:59 AM  [facility] [address] [place], [place], [address] Phone: [phone]   Fax:  [phone]  Name: HIKARU DELORENZO MRN: [id number] Date of Birth: September 21, 1940  PHYSICAL THERAPY DISCHARGE SUMMARY  Visits from Start of Care: 8  Current functional level related to goals / functional outcomes: See above   Remaining deficits: See above   Education / Equipment: Importance of walking, posture and working on balance  Plan: Patient agrees to discharge.  Patient goals were met. Patient is being discharged due to meeting the stated rehab goals.  ?????       Rayetta Humphrey, [place] CLT [phone]

## 2017-10-10 ENCOUNTER — Other Ambulatory Visit: Payer: Self-pay | Admitting: Family Medicine

## 2017-10-12 ENCOUNTER — Encounter (HOSPITAL_COMMUNITY): Payer: PPO | Admitting: Physical Therapy

## 2017-10-14 ENCOUNTER — Encounter (HOSPITAL_COMMUNITY): Payer: PPO | Admitting: Physical Therapy

## 2017-11-27 ENCOUNTER — Other Ambulatory Visit: Payer: Self-pay | Admitting: Family Medicine

## 2017-11-30 ENCOUNTER — Other Ambulatory Visit: Payer: Self-pay | Admitting: Family Medicine

## 2017-11-30 DIAGNOSIS — R197 Diarrhea, unspecified: Secondary | ICD-10-CM | POA: Diagnosis not present

## 2017-12-01 LAB — CLOSTRIDIUM DIFFICILE BY PCR: CDIFFPCR: NEGATIVE

## 2017-12-03 NOTE — Addendum Note (Signed)
Addended by: Karle Barr on: 12/03/2017 02:34 PM   Modules accepted: Orders

## 2017-12-04 LAB — STOOL CULTURE: E COLI SHIGA TOXIN ASSAY: NEGATIVE

## 2018-01-05 ENCOUNTER — Encounter: Payer: Self-pay | Admitting: Family Medicine

## 2018-01-05 ENCOUNTER — Ambulatory Visit (INDEPENDENT_AMBULATORY_CARE_PROVIDER_SITE_OTHER): Payer: PPO | Admitting: Family Medicine

## 2018-01-05 VITALS — BP 116/74 | Ht 70.0 in | Wt 317.0 lb

## 2018-01-05 DIAGNOSIS — Z79891 Long term (current) use of opiate analgesic: Secondary | ICD-10-CM

## 2018-01-05 DIAGNOSIS — I10 Essential (primary) hypertension: Secondary | ICD-10-CM

## 2018-01-05 DIAGNOSIS — E7800 Pure hypercholesterolemia, unspecified: Secondary | ICD-10-CM

## 2018-01-05 MED ORDER — HYDROMORPHONE HCL 2 MG PO TABS: ORAL_TABLET | ORAL | 0 refills | Status: DC

## 2018-01-05 MED ORDER — LISINOPRIL 5 MG PO TABS: 5.0000 mg | ORAL_TABLET | Freq: Every day | ORAL | 5 refills | Status: DC

## 2018-01-05 MED ORDER — DULOXETINE HCL 60 MG PO CPEP: 60.0000 mg | ORAL_CAPSULE | Freq: Every day | ORAL | 5 refills | Status: DC

## 2018-01-05 NOTE — Progress Notes (Signed)
Subjective:    Patient ID: Steven [name], male    DOB: 1940-12-15, 77 y.o.   MRN: [id number]  HPI This patient was seen today for chronic pain Patient has underlying atrial fib does take his medication denies any bleeding trouble Patient here for follow-up regarding cholesterol.  The patient does have hyperlipidemia.  Patient does try to maintain a reasonable diet.  Patient does take the medication on a regular basis.  Denies missing a dose.  The patient denies any obvious side effects.  Prior blood work results reviewed with the patient.  The patient is aware of his cholesterol goals and the need to keep it under good control to lessen the risk of disease.  Patient for blood pressure check up.  The patient does have hypertension.  The patient is on medication.  Patient relates compliance with meds. Todays BP reviewed with the patient. Patient denies issues with medication. Patient relates reasonable diet. Patient tries to minimize salt. Patient aware of BP goals.  Chronic pain with some depressed and used with Cymbalta states it is helping would like to continue The medication list was reviewed and updated. Reflux under good control continue current medicine  -Compliance with medication: yes  - Number patient states they take daily: 3-4 a day  -when was the last dose patient took? This am  The patient was advised the importance of maintaining medication and not using illegal substances with these.  Here for refills and follow up  The patient was educated that we can provide 3 monthly scripts for their medication, it is their responsibility to follow the instructions.  Side effects or complications from medications: none  Patient is aware that pain medications are meant to minimize the severity of the pain to allow their pain levels to improve to allow for better function. They are aware of that pain medications cannot totally remove their pain.  Due for UDT ( at least once per year) :  05/2017        Review of Systems  Constitutional: Negative for diaphoresis and fatigue.  HENT: Negative for congestion and rhinorrhea.   Respiratory: Negative for cough and shortness of breath.   Cardiovascular: Negative for chest pain and leg swelling.  Gastrointestinal: Negative for abdominal pain and diarrhea.  Musculoskeletal: Positive for back pain. Negative for arthralgias.  Skin: Negative for color change and rash.  Neurological: Negative for dizziness and headaches.  Psychiatric/Behavioral: Negative for behavioral problems and confusion.       Objective:   Physical Exam  Constitutional: He appears well-nourished. No distress.  HENT:  Head: Normocephalic and atraumatic.  Eyes: Right eye exhibits no discharge. Left eye exhibits no discharge.  Neck: No tracheal deviation present.  Cardiovascular: Normal rate and normal heart sounds.  No murmur heard. Pulmonary/Chest: Effort normal and breath sounds normal. No respiratory distress.  Musculoskeletal: He exhibits no edema.  Lymphadenopathy:    He has no cervical adenopathy.  Neurological: He is alert. Coordination normal.  Skin: Skin is warm and dry.  Psychiatric: He has a normal mood and affect. His behavior is normal.  Vitals reviewed.         Assessment & Plan:  The patient was seen in followup for chronic pain. A review over at their current pain status was discussed. Drug registry was checked. Prescriptions were given. Discussion was held regarding the importance of compliance with medication as well as pain medication contract.  Time for questions regarding pain management plan occurred. Importance of regular followup  visits was discussed. Patient was informed that medication may cause drowsiness and should not be combined  with other medications/alcohol or street drugs. Patient was cautioned that medication could cause drowsiness. If the patient feels medication is causing altered alertness then do not  drive or operate dangerous equipment.  Hyperlipidemia check labs continue medication watch diet  Blood pressure continue medications watch diet  Morbid obesity watch diet try to stay active try to lose weight  Atrial fibrillation stable continue medication no bleeding issues.  Follow-up in approximately 3 to 4 months  25 minutes was spent with the patient.  This statement verifies that 25 minutes was indeed spent with the patient.  More than 50% of this visit-total duration of the visit-was spent in counseling and coordination of care. The issues that the patient came in for today as reflected in the diagnosis (s) please refer to documentation for further details.

## 2018-01-08 DIAGNOSIS — R197 Diarrhea, unspecified: Secondary | ICD-10-CM | POA: Diagnosis not present

## 2018-01-08 DIAGNOSIS — K219 Gastro-esophageal reflux disease without esophagitis: Secondary | ICD-10-CM | POA: Diagnosis not present

## 2018-01-11 ENCOUNTER — Encounter

## 2018-01-21 ENCOUNTER — Other Ambulatory Visit: Payer: Self-pay | Admitting: Family Medicine

## 2018-02-12 ENCOUNTER — Other Ambulatory Visit: Payer: Self-pay | Admitting: *Deleted

## 2018-02-12 DIAGNOSIS — Z79899 Other long term (current) drug therapy: Secondary | ICD-10-CM | POA: Diagnosis not present

## 2018-02-12 DIAGNOSIS — I10 Essential (primary) hypertension: Secondary | ICD-10-CM

## 2018-02-12 DIAGNOSIS — E7800 Pure hypercholesterolemia, unspecified: Secondary | ICD-10-CM

## 2018-02-13 LAB — BASIC METABOLIC PANEL
BUN / CREAT RATIO: 18 (ref 10–24)
BUN: 20 mg/dL (ref 8–27)
CALCIUM: 9.5 mg/dL (ref 8.6–10.2)
CHLORIDE: 100 mmol/L (ref 96–106)
CO2: 25 mmol/L (ref 20–29)
Creatinine, Ser: 1.13 mg/dL (ref 0.76–1.27)
GFR calc non Af Amer: 63 mL/min/{1.73_m2} (ref >59)
GFR, EST AFRICAN AMERICAN: 73 mL/min/{1.73_m2} (ref >59)
Glucose: 117 mg/dL — ABNORMAL HIGH (ref 65–99)
POTASSIUM: 5 mmol/L (ref 3.5–5.2)
SODIUM: 139 mmol/L (ref 134–144)

## 2018-02-13 LAB — HEPATIC FUNCTION PANEL
ALK PHOS: 76 IU/L (ref 39–117)
ALT: 11 IU/L (ref 0–44)
AST: 18 IU/L (ref 0–40)
Albumin: 4.2 g/dL (ref 3.5–4.8)
Bilirubin Total: 0.6 mg/dL (ref 0.0–1.2)
Bilirubin, Direct: 0.2 mg/dL (ref 0.00–0.40)
Total Protein: 6.7 g/dL (ref 6.0–8.5)

## 2018-02-13 LAB — LIPID PANEL
Chol/HDL Ratio: 3.6 ratio (ref 0.0–5.0)
Cholesterol, Total: 126 mg/dL (ref 100–199)
HDL: 35 mg/dL — AB (ref >39)
LDL Calculated: 69 mg/dL (ref 0–99)
Triglycerides: 112 mg/dL (ref 0–149)
VLDL Cholesterol Cal: 22 mg/dL (ref 5–40)

## 2018-02-14 ENCOUNTER — Encounter: Payer: Self-pay | Admitting: Family Medicine

## 2018-02-15 ENCOUNTER — Ambulatory Visit: Payer: PPO | Admitting: Cardiovascular Disease

## 2018-02-15 ENCOUNTER — Encounter: Payer: Self-pay | Admitting: Cardiovascular Disease

## 2018-02-15 VITALS — BP 124/86 | HR 88 | Ht 70.0 in | Wt 316.4 lb

## 2018-02-15 DIAGNOSIS — I4819 Other persistent atrial fibrillation: Secondary | ICD-10-CM

## 2018-02-15 DIAGNOSIS — E782 Mixed hyperlipidemia: Secondary | ICD-10-CM

## 2018-02-15 DIAGNOSIS — I10 Essential (primary) hypertension: Secondary | ICD-10-CM | POA: Diagnosis not present

## 2018-02-15 DIAGNOSIS — I2510 Atherosclerotic heart disease of native coronary artery without angina pectoris: Secondary | ICD-10-CM

## 2018-02-15 NOTE — Progress Notes (Signed)
Cardiology Office Note:    Date:  02/15/2018   ID:  Park Breed, DOB 07-14-1940, MRN [id number]  PCP:  Kathyrn Drown, MD  Cardiologist:  Sherren Mocha, MD  Electrophysiologist:  None   Referring MD: Kathyrn Drown, MD   Chief Complaint  Patient presents with  . Shortness of Breath    History of Present Illness:    Steven [name] is a 77 y.o. male with a hx of coronary artery disease and atrial fibrillation, presenting for follow-up evaluation.  The patient is here with his wife today. He continues to have problems with his knees. He's also having problems with his back.  He denies any changes in cardiac-related symptoms.  He specifically denies any chest pain or chest pressure.  He has shortness of breath with exertion and this is unchanged over time.  He is very sedentary.  He denies orthopnea or PND.  He denies presyncope or frank syncope.  He is had no bleeding problems.  Past Medical History:  Diagnosis Date  . Chronic pain   . Complication of anesthesia    pt woke up during last surgery   . Coronary artery disease 02-27-11   Dr. Jacquiline Doe follows-has some blocked coronary arteries ,not suitable for stent  placement  . Dyslipidemia   . Dysrhythmia    hx of atrial fib during last hospitalization   . GERD (gastroesophageal reflux disease) 02-27-11   Acid reflux  . Hearing loss 02-27-11   Bilateral hearing aids-due to exposure to loud machinery  . Hemorrhoids 02-27-11   not bothersome at this time  . Hyperlipidemia   . Hypertension   . Leg swelling   . Obesity   . Osteoarthritis 02-27-11   Ostearthritis-knees, shoulders.Back causes chronic pain-radiates down right leg  . Rash   . Seizures ([place]) 07/2014  . Shingles outbreak 02-27-11   2 weeks ago , was tx.-only residual is tenderness of right scalp-no open areas  . Shortness of breath    with exertion   . Urinary incontinence 02-27-11   not an everday occurrence-no special measures    Past Surgical  History:  Procedure Laterality Date  . CHOLECYSTECTOMY  03/14/2011   Procedure: LAPAROSCOPIC CHOLECYSTECTOMY;  Surgeon: Edward Jolly, MD;  Location: [facility];  Service: General;  Laterality: N/A;  . COLONOSCOPY    . INJECTION KNEE  03/05/2011   Procedure: KNEE INJECTION;  Surgeon: Gearlean Alf;  Location: [facility];  Service: Orthopedics;  Laterality: Left;  80 mg depomedrol  . LAPAROSCOPY  03/16/2011   Procedure: LAPAROSCOPY DIAGNOSTIC;  Surgeon: Judieth Keens, DO;  Location: [facility];  Service: General;  Laterality: N/A;  repair incisional hernia  . REPLACEMENT TOTAL KNEE  02-27-11   right- 2007  . TOTAL KNEE ARTHROPLASTY  03/01/2012   Procedure: TOTAL KNEE ARTHROPLASTY;  Surgeon: Gearlean Alf, MD;  Location: [facility];  Service: Orthopedics;  Laterality: Left;  . TOTAL KNEE REVISION  03/05/2011   Procedure: TOTAL KNEE REVISION;  Surgeon: Dione Plover Aluisio;  Location: [facility];  Service: Orthopedics;  Laterality: Right;    Current Medications: Current Meds  Medication Sig  . apixaban (ELIQUIS) 5 MG TABS tablet Take 1 tablet (5 mg total) by mouth 2 (two) times daily.  Marland Kitchen atorvastatin (LIPITOR) 40 MG tablet TAKE ONE TABLET BY MOUTH ONCE DAILY.  . cholecalciferol (VITAMIN D) 1000 UNITS tablet Take 1,000 Units by mouth daily.  . Cyanocobalamin (VITAMIN B-12 PO) Take 1,000 mg by mouth daily.  Marland Kitchen  DULoxetine (CYMBALTA) 60 MG capsule Take 1 capsule (60 mg total) by mouth daily.  Marland Kitchen HYDROmorphone (DILAUDID) 2 MG tablet 1 q 4 hours prn pain ( no greater than 4 per day)  . lisinopril (PRINIVIL,ZESTRIL) 5 MG tablet Take 1 tablet (5 mg total) by mouth daily.  . Omega-3 Fatty Acids (FISH OIL) 1000 MG CAPS Take 1,000 mg by mouth daily.  . pantoprazole (PROTONIX) 40 MG tablet TAKE ONE TABLET BY MOUTH ONCE DAILY.     Allergies:   Ciprofloxacin and Oxycodone-acetaminophen   Social History   Socioeconomic History  . Marital status: Married    Spouse name: Not on file  . Number of children: 3  .  Years of education: 81  . Highest education level: Not on file  Occupational History  . Occupation: Retired    Fish farm manager: RETIRED  Social Needs  . Financial resource strain: Not on file  . Food insecurity:    Worry: Not on file    Inability: Not on file  . Transportation needs:    Medical: Not on file    Non-medical: Not on file  Tobacco Use  . Smoking status: Former Smoker    Start date: 12/24/1961  . Smokeless tobacco: Former Systems developer    Types: [place] date: 01/27/1973  Substance and Sexual Activity  . Alcohol use: No  . Drug use: No  . Sexual activity: Yes    Birth control/protection: None  Lifestyle  . Physical activity:    Days per week: Not on file    Minutes per session: Not on file  . Stress: Not on file  Relationships  . Social connections:    Talks on phone: Not on file    Gets together: Not on file    Attends religious service: Not on file    Active member of club or organization: Not on file    Attends meetings of clubs or organizations: Not on file    Relationship status: Not on file  Other Topics Concern  . Not on file  Social History Narrative   Lives at home w/ his wife   Right-handed   Caffeine: some daily     Family History: The patient's family history includes COPD in his father; Cancer in his father; Heart attack in his father; Heart attack (age of onset: 44) in his sister; Heart disease in his mother; Hypertension in his mother; Other (age of onset: 19) in his mother. There is no history of Seizures.  ROS:   Please see the history of present illness.    Positive for low back pain, knee pain, depression, diarrhea, exertional dyspnea, hearing loss, knee swelling, knee pain, dizziness, easy bruising, excessive sweating, excessive fatigue, snoring, anxiety, balance problems.  All other systems reviewed and are negative.  EKGs/Labs/Other Studies Reviewed:    The following studies were reviewed today: Echo 09-16-2017: Left ventricle:  The cavity  size was normal. Wall thickness was increased in a pattern of mild LVH. Systolic function was vigorous. The estimated ejection fraction was in the range of 65% to 70%. Wall motion was normal; there were no regional wall motion abnormalities. The study was not technically sufficient to allow evaluation of LV diastolic dysfunction due to atrial fibrillation.   ------------------------------------------------------------------- Aortic valve:   Moderately calcified annulus. Trileaflet; mildly thickened, mildly calcified leaflets. Morphologically, there appears to be at least mild calcific aortic stenosis.  Doppler: There was no significant regurgitation.  ------------------------------------------------------------------- Aorta:  Mild aortic root dilatation.  -------------------------------------------------------------------  Mitral valve:   Moderately calcified annulus.  Doppler:  There was trivial regurgitation.    Peak gradient (D): 7 mm Hg.  ------------------------------------------------------------------- Left atrium:  The atrium was moderately dilated.  ------------------------------------------------------------------- Atrial septum:  No defect or patent foramen ovale was identified.   ------------------------------------------------------------------- Right ventricle:  The cavity size was normal. Wall thickness was normal. Systolic function was normal.  ------------------------------------------------------------------- Pulmonic valve:    The valve appears to be grossly normal. Doppler:  There was no significant regurgitation.  ------------------------------------------------------------------- Tricuspid valve:   Structurally normal valve.   Leaflet separation was normal.  Doppler:  Transvalvular velocity was within the normal range. There was trivial regurgitation.  ------------------------------------------------------------------- Right atrium:  The atrium was  normal in size.  ------------------------------------------------------------------- Pericardium:  There was no pericardial effusion.  ------------------------------------------------------------------- Systemic veins: Inferior vena cava: The vessel was normal in size. The respirophasic diameter changes were in the normal range (>= 50%), consistent with normal central venous pressure.  ------------------------------------------------------------------- Post procedure conclusions Ascending Aorta:  - Mild aortic root dilatation.  EKG:  EKG is not ordered today.   Recent Labs: 09/03/2017: Hemoglobin 15.6; Platelets 264; TSH 3.040 02/12/2018: ALT 11; BUN 20; Creatinine, Ser 1.13; Potassium 5.0; Sodium 139  Recent Lipid Panel    Component Value Date/Time   CHOL 126 02/12/2018 0827   TRIG 112 02/12/2018 0827   HDL 35 (L) 02/12/2018 0827   CHOLHDL 3.6 02/12/2018 0827   CHOLHDL 3.4 07/20/2015 0435   VLDL 19 07/20/2015 0435   LDLCALC 69 02/12/2018 0827    Physical Exam:    VS:  BP 124/86   Pulse 88   Ht 5\' 10"  (1.778 m)   Wt (!) 316 lb 6.4 oz (143.5 kg)   SpO2 93%   BMI 45.40 kg/m     Wt Readings from Last 3 Encounters:  02/15/18 (!) 316 lb 6.4 oz (143.5 kg)  01/05/18 (!) 317 lb (143.8 kg)  10/02/17 (!) 320 lb (145.2 kg)     GEN: Very pleasant morbidly obese male in no acute distress HEENT: Normal NECK: No JVD; No carotid bruits LYMPHATICS: No lymphadenopathy CARDIAC: Irregularly irregular, soft systolic ejection murmur at the right upper sternal border RESPIRATORY:  Clear to auscultation without rales, wheezing or rhonchi  ABDOMEN: Soft, non-tender, non-distended MUSCULOSKELETAL: Trace bilateral pretibial edema; right knee swelling is present.  No deformity  SKIN: Warm and dry NEUROLOGIC:  Alert and oriented x 3 PSYCHIATRIC:  Normal affect   ASSESSMENT:    1. Persistent atrial fibrillation   2. Coronary artery disease involving native coronary artery of  native heart without angina pectoris   3. Morbid obesity ([place])   4. Essential hypertension   5. Mixed hyperlipidemia    PLAN:    In order of problems listed above:  1. Heart rate remains well controlled.  Patient is anticoagulated with apixaban. 2. The patient is having no symptoms of angina at present.  He is not on antiplatelet therapy because of concurrent oral anticoagulation.  He is treated with an ACE inhibitor and a statin drug. 3. Counseling is done.  We discussed much lifestyle modification again.  Unlikely to make significant changes. 4. Blood pressure is controlled on lisinopril. 5. Lipids are treated with atorvastatin and omega-3, LDL cholesterol 68 mg/dL.   Medication Adjustments/Labs and Tests Ordered: Current medicines are reviewed at length with the patient today.  Concerns regarding medicines are outlined above.  No orders of the defined types were placed in this encounter.  No orders of  the defined types were placed in this encounter.   Patient Instructions  Medication Instructions:  Your provider recommends that you continue on your current medications as directed. Please refer to the Current Medication list given to you today.    Labwork: None  Testing/Procedures: None  Follow-Up: Your provider wants you to follow-up in: 1 year with Dr. Burt Knack. You will receive a reminder letter in the mail two months in advance. If you don't receive a letter, please call our office to schedule the follow-up appointment.    Any Other Special Instructions Will Be Listed Below (If Applicable).     If you need a refill on your cardiac medications before your next appointment, please call your pharmacy.      Signed, Sherren Mocha, MD  02/15/2018 11:24 AM    [place]

## 2018-02-15 NOTE — Patient Instructions (Addendum)

## 2018-02-24 ENCOUNTER — Other Ambulatory Visit: Payer: Self-pay | Admitting: Family Medicine

## 2018-02-26 ENCOUNTER — Ambulatory Visit: Payer: PPO | Admitting: Cardiovascular Disease

## 2018-03-23 ENCOUNTER — Telehealth: Payer: Self-pay | Admitting: *Deleted

## 2018-03-23 ENCOUNTER — Other Ambulatory Visit: Payer: Self-pay | Admitting: Gastroenterology

## 2018-03-23 DIAGNOSIS — R109 Unspecified abdominal pain: Secondary | ICD-10-CM | POA: Diagnosis not present

## 2018-03-23 DIAGNOSIS — R197 Diarrhea, unspecified: Secondary | ICD-10-CM | POA: Diagnosis not present

## 2018-03-23 DIAGNOSIS — Z7901 Long term (current) use of anticoagulants: Secondary | ICD-10-CM | POA: Diagnosis not present

## 2018-03-23 DIAGNOSIS — R140 Abdominal distension (gaseous): Secondary | ICD-10-CM | POA: Diagnosis not present

## 2018-03-23 NOTE — Telephone Encounter (Signed)
Patient with diagnosis of Afib on Eliquis for anticoagulation.    Procedure: colonoscopy Date of procedure: 05/07/2018  CHADS2-VASc score of  4 (CHF, HTN, AGE, DM2, stroke/tia x 2, CAD, AGE, male)  CrCl 161ml/min  Per office protocol, patient can hold Eliquis for 1-2 days prior to procedure.

## 2018-03-23 NOTE — Telephone Encounter (Signed)
   [facility] Pre-operative Risk Assessment    Request for surgical clearance:  1. What type of surgery is being performed? COLONOSCOPY/ENDOSCOPY   2. When is this surgery scheduled? 05/07/18   3. Are there any medications that need to be held prior to surgery and how long? ELIQUIS   4. Practice name and name of physician performing surgery? [facility]; DR. Paulita Fujita   5. What is your office phone and fax number? PH# [phone]; FAX# [phone]   1. Anesthesia type (None, local, MAC, general) ? PROPOFOL ?    Steven [name] 03/23/2018, 4:06 PM  _________________________________________________________________   (provider comments below)

## 2018-03-25 ENCOUNTER — Ambulatory Visit
Admission: RE | Admit: 2018-03-25 | Discharge: 2018-03-25 | Disposition: A | Discharge status: Home / Self Care | Payer: PPO | Source: Ambulatory Visit | Attending: Gastroenterology | Admitting: Gastroenterology

## 2018-03-25 DIAGNOSIS — N281 Cyst of kidney, acquired: Secondary | ICD-10-CM | POA: Diagnosis not present

## 2018-03-25 DIAGNOSIS — R140 Abdominal distension (gaseous): Secondary | ICD-10-CM

## 2018-03-29 ENCOUNTER — Other Ambulatory Visit: Payer: Self-pay | Admitting: Cardiovascular Disease

## 2018-03-29 ENCOUNTER — Other Ambulatory Visit: Payer: Self-pay | Admitting: Family Medicine

## 2018-04-30 ENCOUNTER — Other Ambulatory Visit: Payer: Self-pay | Admitting: Family Medicine

## 2018-05-07 DIAGNOSIS — D125 Benign neoplasm of sigmoid colon: Secondary | ICD-10-CM | POA: Diagnosis not present

## 2018-05-07 DIAGNOSIS — K317 Polyp of stomach and duodenum: Secondary | ICD-10-CM | POA: Diagnosis not present

## 2018-05-07 DIAGNOSIS — D124 Benign neoplasm of descending colon: Secondary | ICD-10-CM | POA: Diagnosis not present

## 2018-05-07 DIAGNOSIS — K921 Melena: Secondary | ICD-10-CM | POA: Diagnosis not present

## 2018-05-07 DIAGNOSIS — R1033 Periumbilical pain: Secondary | ICD-10-CM | POA: Diagnosis not present

## 2018-05-07 DIAGNOSIS — D123 Benign neoplasm of transverse colon: Secondary | ICD-10-CM | POA: Diagnosis not present

## 2018-05-07 DIAGNOSIS — R197 Diarrhea, unspecified: Secondary | ICD-10-CM | POA: Diagnosis not present

## 2018-05-07 DIAGNOSIS — K648 Other hemorrhoids: Secondary | ICD-10-CM | POA: Diagnosis not present

## 2018-05-07 DIAGNOSIS — R1013 Epigastric pain: Secondary | ICD-10-CM | POA: Diagnosis not present

## 2018-05-11 ENCOUNTER — Other Ambulatory Visit: Payer: Self-pay | Admitting: Family Medicine

## 2018-05-11 DIAGNOSIS — D123 Benign neoplasm of transverse colon: Secondary | ICD-10-CM | POA: Diagnosis not present

## 2018-05-11 DIAGNOSIS — K317 Polyp of stomach and duodenum: Secondary | ICD-10-CM | POA: Diagnosis not present

## 2018-05-11 DIAGNOSIS — D125 Benign neoplasm of sigmoid colon: Secondary | ICD-10-CM | POA: Diagnosis not present

## 2018-05-11 DIAGNOSIS — D124 Benign neoplasm of descending colon: Secondary | ICD-10-CM | POA: Diagnosis not present

## 2018-06-02 ENCOUNTER — Ambulatory Visit (INDEPENDENT_AMBULATORY_CARE_PROVIDER_SITE_OTHER): Payer: PPO | Admitting: Family Medicine

## 2018-06-02 ENCOUNTER — Encounter: Payer: Self-pay | Admitting: Family Medicine

## 2018-06-02 VITALS — BP 116/74 | Wt 328.2 lb

## 2018-06-02 DIAGNOSIS — Z79891 Long term (current) use of opiate analgesic: Secondary | ICD-10-CM | POA: Diagnosis not present

## 2018-06-02 DIAGNOSIS — I10 Essential (primary) hypertension: Secondary | ICD-10-CM

## 2018-06-02 DIAGNOSIS — G4733 Obstructive sleep apnea (adult) (pediatric): Secondary | ICD-10-CM

## 2018-06-02 MED ORDER — HYDROMORPHONE HCL 2 MG PO TABS: ORAL_TABLET | ORAL | 0 refills | Status: DC

## 2018-06-02 MED ORDER — PANTOPRAZOLE SODIUM 40 MG PO TBEC: 40.0000 mg | DELAYED_RELEASE_TABLET | Freq: Every day | ORAL | 5 refills | Status: DC

## 2018-06-02 MED ORDER — LISINOPRIL 5 MG PO TABS: 5.0000 mg | ORAL_TABLET | Freq: Every day | ORAL | 5 refills | Status: DC

## 2018-06-02 MED ORDER — DULOXETINE HCL 60 MG PO CPEP: 60.0000 mg | ORAL_CAPSULE | Freq: Every day | ORAL | 5 refills | Status: DC

## 2018-06-02 MED ORDER — ATORVASTATIN CALCIUM 40 MG PO TABS: 40.0000 mg | ORAL_TABLET | Freq: Every day | ORAL | 5 refills | Status: DC

## 2018-06-02 NOTE — Progress Notes (Signed)
Subjective:    Patient ID: Steven [name], male    DOB: 01/15/1941, 78 y.o.   MRN: [id number]  HPI This patient was seen today for chronic pain Patient does have sleep apnea and uses a CPAP machine states it is doing a good job helping him  Patient for blood pressure check up.  The patient does have hypertension.  The patient is on medication.  Patient relates compliance with meds. Todays BP reviewed with the patient. Patient denies issues with medication. Patient relates reasonable diet. Patient tries to minimize salt. Patient aware of BP goals.   The medication list was reviewed and updated.   -Compliance with medication: taking hydromorphine 2 mg one every 4 hours prn pain  - Number patient states they take daily: 2-3 per day  -when was the last dose patient took? This morning  The patient was advised the importance of maintaining medication and not using illegal substances with these.  Here for refills and follow up  The patient was educated that we can provide 3 monthly scripts for their medication, it is their responsibility to follow the instructions.  Side effects or complications from medications: none  Patient is aware that pain medications are meant to minimize the severity of the pain to allow their pain levels to improve to allow for better function. They are aware of that pain medications cannot totally remove their pain.  Due for UDT ( at least once per year) : done today 06/02/2018       Review of Systems  Constitutional: Negative for diaphoresis and fatigue.  HENT: Negative for congestion and rhinorrhea.   Respiratory: Negative for cough and shortness of breath.   Cardiovascular: Negative for chest pain and leg swelling.  Gastrointestinal: Negative for abdominal pain and diarrhea.  Skin: Negative for color change and rash.  Neurological: Negative for dizziness and headaches.  Psychiatric/Behavioral: Negative for behavioral problems and confusion.         Objective:   Physical Exam Vitals signs reviewed.  Constitutional:      General: He is not in acute distress. HENT:     Head: Normocephalic and atraumatic.  Eyes:     General:        Right eye: No discharge.        Left eye: No discharge.  Neck:     Trachea: No tracheal deviation.  Cardiovascular:     Rate and Rhythm: Normal rate and regular rhythm.     Heart sounds: Normal heart sounds. No murmur.  Pulmonary:     Effort: Pulmonary effort is normal. No respiratory distress.     Breath sounds: Normal breath sounds.  Lymphadenopathy:     Cervical: No cervical adenopathy.  Skin:    General: Skin is warm and dry.  Neurological:     Mental Status: He is alert.     Coordination: Coordination normal.  Psychiatric:        Behavior: Behavior normal.           Assessment & Plan:  No labs indicated today  The patient was seen in followup for chronic pain. A review over at their current pain status was discussed. Drug registry was checked. Prescriptions were given. Discussion was held regarding the importance of compliance with medication as well as pain medication contract.  Time for questions regarding pain management plan occurred. Importance of regular followup visits was discussed. Patient was informed that medication may cause drowsiness and should not be combined  with other medications/alcohol  or street drugs. Patient was cautioned that medication could cause drowsiness. If the patient feels medication is causing altered alertness then do not drive or operate dangerous equipment.   HTN- Patient was seen today as part of a visit regarding hypertension. The importance of healthy diet and regular physical activity was discussed. The importance of compliance with medications discussed.  Ideal goal is to keep blood pressure low elevated levels certainly below 458/59 when possible.  The patient was counseled that keeping blood pressure under control lessen his risk of  complications.  The importance of regular follow-ups was discussed with the patient.  Low-salt diet such as DASH recommended.  Regular physical activity was recommended as well.  Patient was advised to keep regular follow-ups.  Continue current measures medicine sent in follow-up in 3 months

## 2018-06-06 LAB — TOXASSURE SELECT 13 (MW), URINE

## 2018-06-28 ENCOUNTER — Telehealth: Payer: Self-pay | Admitting: Cardiovascular Disease

## 2018-06-28 NOTE — Telephone Encounter (Signed)
Pt is having gum tissue surgery done. He had two root canals done last week. When he was scheduling this procedure, the dentist wanted to know if the pt could stop the eliquis. The procedure could still be done, but it would be better if the pt could stop medication to reduce bleeding      [facility] Pre-operative Risk Assessment    Request for surgical clearance:  1. What type of surgery is being performed? Gum tissue  2. When is this surgery scheduled? 07/07/18  3. What type of clearance is required (medical clearance vs. Pharmacy clearance to hold med vs. Both)? pharmacy  4. Are there any medications that need to be held prior to surgery and how long? Eliquis  5. Practice name and name of physician performing surgery? [facility] Purpura, [facility] [id number]   6. What is your office phone number: [phone]   7.   What is your office fax number: [phone]  8.   Anesthesia type (None, local, MAC, general) ? Doesn't think so   Johnna Acosta 06/28/2018, 2:52 PM  _________________________________________________________________   (provider comments below)

## 2018-06-28 NOTE — Telephone Encounter (Signed)
Pt takes Eliquis for afib with CHADS2VASc score of 4 (age x2, HTN, CAD). Renal function is normal. Ok to hold Eliquis for 1 day prior to minor dental work.

## 2018-06-28 NOTE — Telephone Encounter (Signed)
   Primary Cardiologist: Sherren Mocha, MD  Chart reviewed as part of pre-operative protocol coverage. Patient was contacted 06/28/2018 in reference to pre-operative risk assessment for pending surgery as outlined below.  RAKAN SOFFER was last seen on 02/15/2018 by Dr. Burt Knack.  Since that day, DAXEN LANUM has done well without any chest pain.  Therefore, based on ACC/AHA guidelines, the patient would be at acceptable risk for the planned procedure without further cardiovascular testing.   I will route this recommendation to the requesting party via Epic fax function and remove from pre-op pool.  Please call with questions. Per our clinical pharmacist, he may hold eliquis for 1 day prior to the procedure and restart as soon as possible afterward. I have informed the patient.  [place], [place] 06/28/2018, 4:56 PM

## 2018-09-02 ENCOUNTER — Ambulatory Visit (INDEPENDENT_AMBULATORY_CARE_PROVIDER_SITE_OTHER): Payer: PPO | Admitting: Family Medicine

## 2018-09-02 ENCOUNTER — Other Ambulatory Visit: Payer: Self-pay

## 2018-09-02 ENCOUNTER — Ambulatory Visit: Payer: PPO | Admitting: Family Medicine

## 2018-09-02 DIAGNOSIS — Z125 Encounter for screening for malignant neoplasm of prostate: Secondary | ICD-10-CM

## 2018-09-02 DIAGNOSIS — R7301 Impaired fasting glucose: Secondary | ICD-10-CM

## 2018-09-02 DIAGNOSIS — Z79891 Long term (current) use of opiate analgesic: Secondary | ICD-10-CM | POA: Diagnosis not present

## 2018-09-02 DIAGNOSIS — I10 Essential (primary) hypertension: Secondary | ICD-10-CM | POA: Diagnosis not present

## 2018-09-02 DIAGNOSIS — R7303 Prediabetes: Secondary | ICD-10-CM

## 2018-09-02 DIAGNOSIS — M170 Bilateral primary osteoarthritis of knee: Secondary | ICD-10-CM | POA: Diagnosis not present

## 2018-09-02 MED ORDER — HYDROMORPHONE HCL 2 MG PO TABS: ORAL_TABLET | ORAL | 0 refills | Status: DC

## 2018-09-02 NOTE — Progress Notes (Signed)
Subjective:  tele  Patient ID: Steven [name], male    DOB: May 14, 1940, 78 y.o.   MRN: [id number]  HPI Telephone visit audio was capable patient unable to do video This patient was seen today for chronic pain  The medication list was reviewed and updated.   -Compliance with medication: yes-Hydromorphone  - Number patient states they take daily: 3-4  -when was the last dose patient took? today  The patient was advised the importance of maintaining medication and not using illegal substances with these.  Here for refills and follow up  The patient was educated that we can provide 3 monthly scripts for their medication, it is their responsibility to follow the instructions.  Side effects or complications from medications: none  Patient is aware that pain medications are meant to minimize the severity of the pain to allow their pain levels to improve to allow for better function. They are aware of that pain medications cannot totally remove their pain.  Virtual Visit via Video Note  I connected with Park Breed on 09/02/18 at  9:30 AM EDT by a video enabled telemedicine application and verified that I am speaking with the correct person using two identifiers.  Location: Patient: home Provider: office   I discussed the limitations of evaluation and management by telemedicine and the availability of in person appointments. The patient expressed understanding and agreed to proceed.  History of Present Illness:    Observations/Objective:   Assessment and Plan:   Follow Up Instructions:    I discussed the assessment and treatment plan with the patient. The patient was provided an opportunity to ask questions and all were answered. The patient agreed with the plan and demonstrated an understanding of the instructions.   The patient was advised to call back or seek an in-person evaluation if the symptoms worsen or if the condition fails to improve as anticipated.  I  provided 15 minutes of non-face-to-face time during this encounter.    Patient relates pain medicine is doing fine for him he would like to stick where he is and he is trying to eat healthy stay physically active try to protect himself against coronavirus       Review of Systems  Constitutional: Negative for activity change.  HENT: Negative for congestion and rhinorrhea.   Respiratory: Negative for cough and shortness of breath.   Cardiovascular: Negative for chest pain.  Gastrointestinal: Negative for abdominal pain, diarrhea, nausea and vomiting.  Genitourinary: Negative for dysuria and hematuria.  Neurological: Negative for weakness and headaches.  Psychiatric/Behavioral: Negative for behavioral problems and confusion.       Objective:    Today's visit was via telephone Physical exam was not possible for this visit      Assessment & Plan:  The patient was seen in followup for chronic pain. A review over at their current pain status was discussed. Drug registry was checked. Prescriptions were given. Discussion was held regarding the importance of compliance with medication as well as pain medication contract.  Time for questions regarding pain management plan occurred. Importance of regular followup visits was discussed. Patient was informed that medication may cause drowsiness and should not be combined  with other medications/alcohol or street drugs. Patient was cautioned that medication could cause drowsiness. If the patient feels medication is causing altered alertness then do not drive or operate dangerous equipment.  HTN- Patient was seen today as part of a visit regarding hypertension. The importance of healthy diet and regular  physical activity was discussed. The importance of compliance with medications discussed.  Ideal goal is to keep blood pressure low elevated levels certainly below 022/33 when possible.  The patient was counseled that keeping blood pressure  under control lessen his risk of complications.  The importance of regular follow-ups was discussed with the patient.  Low-salt diet such as DASH recommended.  Regular physical activity was recommended as well.  Patient was advised to keep regular follow-ups.  The patient was seen today as part of an evaluation regarding hyperlipidemia.  Recent lab work has been reviewed with the patient as well as the goals for good cholesterol care.  In addition to this medications have been discussed the importance of compliance with diet and medications discussed as well.  Finally the patient is aware that poor control of cholesterol, noncompliance can dramatically increase the risk of complications. The patient will keep regular office visits and the patient does agreed to periodic lab work.  The patient was seen today for GERD. Patient benefits from medication. Patient to continue medication. Keep all regular follow ups.  The patient's BMI is calculated.  The patient does have obesity.  The patient does try to some degree staying active and watching diet.  It is in the vital signs and acknowledged.  It is above the recommended BMI for the patient's height and weight.  The patient has been counseled regarding healthy diet, restricted portions, avoiding excessive carbohydrates/sugary foods, and increase physical activity as health permits.  It is in the patient's best interest to lower the risk of secondary illness including heart disease strokes and cancer by losing weight.  The patient acknowledges this information.  Overall the patient feels he is doing well.  Previous labs reviewed new labs will be ordered patient will do a follow-up in office visit early September

## 2018-09-28 ENCOUNTER — Other Ambulatory Visit: Payer: Self-pay | Admitting: Cardiovascular Disease

## 2018-09-28 NOTE — Telephone Encounter (Signed)
Eliquis 5mg  refill request received; pt is 78 yrs old, wt-148.9kg, Crea-1.13 on 02/12/2018, last seen by Dr. Burt Knack on 02/15/2018; will send in the refill.

## 2018-10-11 DIAGNOSIS — H26493 Other secondary cataract, bilateral: Secondary | ICD-10-CM | POA: Diagnosis not present

## 2018-10-11 DIAGNOSIS — H04123 Dry eye syndrome of bilateral lacrimal glands: Secondary | ICD-10-CM | POA: Diagnosis not present

## 2018-10-11 DIAGNOSIS — H31001 Unspecified chorioretinal scars, right eye: Secondary | ICD-10-CM | POA: Diagnosis not present

## 2018-10-11 DIAGNOSIS — H43813 Vitreous degeneration, bilateral: Secondary | ICD-10-CM | POA: Diagnosis not present

## 2018-11-01 ENCOUNTER — Other Ambulatory Visit: Payer: Self-pay | Admitting: Family Medicine

## 2018-11-11 ENCOUNTER — Other Ambulatory Visit: Payer: Self-pay

## 2018-12-27 ENCOUNTER — Other Ambulatory Visit: Payer: Self-pay | Admitting: Family Medicine

## 2019-01-03 ENCOUNTER — Other Ambulatory Visit: Payer: Self-pay | Admitting: Family Medicine

## 2019-01-04 DIAGNOSIS — Z79891 Long term (current) use of opiate analgesic: Secondary | ICD-10-CM | POA: Diagnosis not present

## 2019-01-04 DIAGNOSIS — I10 Essential (primary) hypertension: Secondary | ICD-10-CM | POA: Diagnosis not present

## 2019-01-04 DIAGNOSIS — Z125 Encounter for screening for malignant neoplasm of prostate: Secondary | ICD-10-CM | POA: Diagnosis not present

## 2019-01-04 DIAGNOSIS — R7303 Prediabetes: Secondary | ICD-10-CM | POA: Diagnosis not present

## 2019-01-05 LAB — HEMOGLOBIN A1C
Est. average glucose Bld gHb Est-mCnc: 128 mg/dL
Hgb A1c MFr Bld: 6.1 % — ABNORMAL HIGH (ref 4.8–5.6)

## 2019-01-05 LAB — HEPATIC FUNCTION PANEL
ALT: 12 IU/L (ref 0–44)
AST: 16 IU/L (ref 0–40)
Albumin: 3.9 g/dL (ref 3.7–4.7)
Alkaline Phosphatase: 78 IU/L (ref 39–117)
Bilirubin Total: 0.7 mg/dL (ref 0.0–1.2)
Bilirubin, Direct: 0.23 mg/dL (ref 0.00–0.40)
Total Protein: 6.4 g/dL (ref 6.0–8.5)

## 2019-01-05 LAB — LIPID PANEL
Chol/HDL Ratio: 3.6 ratio (ref 0.0–5.0)
Cholesterol, Total: 126 mg/dL (ref 100–199)
HDL: 35 mg/dL — ABNORMAL LOW (ref >39)
LDL Chol Calc (NIH): 71 mg/dL (ref 0–99)
Triglycerides: 110 mg/dL (ref 0–149)
VLDL Cholesterol Cal: 20 mg/dL (ref 5–40)

## 2019-01-05 LAB — BASIC METABOLIC PANEL
BUN/Creatinine Ratio: 17 (ref 10–24)
BUN: 17 mg/dL (ref 8–27)
CO2: 24 mmol/L (ref 20–29)
Calcium: 9.4 mg/dL (ref 8.6–10.2)
Chloride: 100 mmol/L (ref 96–106)
Creatinine, Ser: 0.99 mg/dL (ref 0.76–1.27)
GFR calc Af Amer: 85 mL/min/{1.73_m2} (ref >59)
GFR calc non Af Amer: 73 mL/min/{1.73_m2} (ref >59)
Glucose: 119 mg/dL — ABNORMAL HIGH (ref 65–99)
Potassium: 4.6 mmol/L (ref 3.5–5.2)
Sodium: 138 mmol/L (ref 134–144)

## 2019-01-05 LAB — PSA: Prostate Specific Ag, Serum: <0.1 ng/mL (ref 0.0–4.0)

## 2019-01-12 ENCOUNTER — Other Ambulatory Visit: Payer: Self-pay

## 2019-01-12 ENCOUNTER — Encounter: Payer: Self-pay | Admitting: Family Medicine

## 2019-01-12 ENCOUNTER — Ambulatory Visit (INDEPENDENT_AMBULATORY_CARE_PROVIDER_SITE_OTHER): Payer: PPO | Admitting: Family Medicine

## 2019-01-12 ENCOUNTER — Telehealth: Payer: Self-pay | Admitting: Family Medicine

## 2019-01-12 VITALS — BP 128/80 | Temp 96.7°F | Wt 311.0 lb

## 2019-01-12 DIAGNOSIS — R7303 Prediabetes: Secondary | ICD-10-CM

## 2019-01-12 DIAGNOSIS — M545 Low back pain, unspecified: Secondary | ICD-10-CM

## 2019-01-12 DIAGNOSIS — I10 Essential (primary) hypertension: Secondary | ICD-10-CM

## 2019-01-12 DIAGNOSIS — E7800 Pure hypercholesterolemia, unspecified: Secondary | ICD-10-CM | POA: Diagnosis not present

## 2019-01-12 DIAGNOSIS — G8929 Other chronic pain: Secondary | ICD-10-CM

## 2019-01-12 MED ORDER — TAMSULOSIN HCL 0.4 MG PO CAPS: 0.4000 mg | ORAL_CAPSULE | Freq: Every day | ORAL | 3 refills | Status: DC

## 2019-01-12 MED ORDER — HYDROMORPHONE HCL 2 MG PO TABS: ORAL_TABLET | ORAL | 0 refills | Status: DC

## 2019-01-12 MED ORDER — LISINOPRIL 5 MG PO TABS: 5.0000 mg | ORAL_TABLET | Freq: Every day | ORAL | 1 refills | Status: DC

## 2019-01-12 MED ORDER — ATORVASTATIN CALCIUM 40 MG PO TABS: 40.0000 mg | ORAL_TABLET | Freq: Every day | ORAL | 1 refills | Status: DC

## 2019-01-12 MED ORDER — PANTOPRAZOLE SODIUM 40 MG PO TBEC: DELAYED_RELEASE_TABLET | ORAL | 1 refills | Status: DC

## 2019-01-12 MED ORDER — DULOXETINE HCL 60 MG PO CPEP: 60.0000 mg | ORAL_CAPSULE | Freq: Every day | ORAL | 1 refills | Status: DC

## 2019-01-12 NOTE — Telephone Encounter (Signed)
FYI- patient has had colonoscopy this year it was 05/07/2018 by Dr. Paulita Fujita in [place] and had endocrinology.

## 2019-01-12 NOTE — Progress Notes (Signed)
Subjective:    Patient ID: Steven [name], male    DOB: Sep 01, 1940, 78 y.o.   MRN: [id number]  HPI Pt here for med check.  This patient was seen today for chronic pain  The medication list was reviewed and updated.   -Compliance with medication: He relates compliance with medicine he states medicine does allow him to function better he states if he was not taking medicine he would have an extremely difficult time moving around  - Number patient states they take daily: He takes anywhere between 3 and 4/day  -when was the last dose patient took?  Earlier today  The patient was advised the importance of maintaining medication and not using illegal substances with these.  Here for refills and follow up  The patient was educated that we can provide 3 monthly scripts for their medication, it is their responsibility to follow the instructions.  Side effects or complications from medications: Denies side effects from the medicine  Patient is aware that pain medications are meant to minimize the severity of the pain to allow their pain levels to improve to allow for better function. They are aware of that pain medications cannot totally remove their pain.  Due for UDT ( at least once per year) : On his next visit  Patient does have atrial fibrillation and takes his medication on a regular basis denies any bleeding problems.  Denies any fast heart rate.  Patient does have reflux related issues Protonix does do a good job keeping it under control he does try to watch his diet  Morbid obesity he is trying to cut back on his diet he is trying to exercise more but has difficult time losing weight because his orthopedic problems prevent him from exercising   Patient here for follow-up regarding cholesterol.  The patient does have hyperlipidemia.  Patient does try to maintain a reasonable diet.  Patient does take the medication on a regular basis.  Denies missing a dose.  The patient denies any  obvious side effects.  Prior blood work results reviewed with the patient.  The patient is aware of his cholesterol goals and the need to keep it under good control to lessen the risk of disease. He does try to watch fats in his diet and take his medicine regular basis  Patient for blood pressure check up.  The patient does have hypertension.  The patient is on medication.  Patient relates compliance with meds. Todays BP reviewed with the patient. Patient denies issues with medication. Patient relates reasonable diet. Patient tries to minimize salt. Patient aware of BP goals. He does state he minimizes salt he is unable to do much walking does take his medicines  Review of Systems  Constitutional: Negative for diaphoresis and fatigue.  HENT: Negative for congestion and rhinorrhea.   Respiratory: Negative for cough and shortness of breath.   Cardiovascular: Negative for chest pain and leg swelling.  Gastrointestinal: Negative for abdominal pain and diarrhea.  Musculoskeletal: Positive for arthralgias, back pain and neck pain.  Skin: Negative for color change and rash.  Neurological: Negative for dizziness and headaches.  Psychiatric/Behavioral: Negative for behavioral problems and confusion.   Results for orders placed or performed in visit on 09/02/18  Lipid panel  Result Value Ref Range   Cholesterol, Total 126 100 - 199 mg/dL   Triglycerides 110 0 - 149 mg/dL   HDL 35 (L) >39 mg/dL   VLDL Cholesterol Cal 20 5 - 40 mg/dL  LDL Chol Calc (NIH) 71 0 - 99 mg/dL   Chol/HDL Ratio 3.6 0.0 - 5.0 ratio  Hepatic function panel  Result Value Ref Range   Total Protein 6.4 6.0 - 8.5 g/dL   Albumin 3.9 3.7 - 4.7 g/dL   Bilirubin Total 0.7 0.0 - 1.2 mg/dL   Bilirubin, Direct 0.23 0.00 - 0.40 mg/dL   Alkaline Phosphatase 78 39 - 117 IU/L   AST 16 0 - 40 IU/L   ALT 12 0 - 44 IU/L  Basic metabolic panel  Result Value Ref Range   Glucose 119 (H) 65 - 99 mg/dL   BUN 17 8 - 27 mg/dL   Creatinine,  Ser 0.99 0.76 - 1.27 mg/dL   GFR calc non Af Amer 73 >59 mL/min/1.73   GFR calc Af Amer 85 >59 mL/min/1.73   BUN/Creatinine Ratio 17 10 - 24   Sodium 138 134 - 144 mmol/L   Potassium 4.6 3.5 - 5.2 mmol/L   Chloride 100 96 - 106 mmol/L   CO2 24 20 - 29 mmol/L   Calcium 9.4 8.6 - 10.2 mg/dL  PSA  Result Value Ref Range   Prostate Specific Ag, Serum <0.1 0.0 - 4.0 ng/mL  Hemoglobin A1c  Result Value Ref Range   Hgb A1c MFr Bld 6.1 (H) 4.8 - 5.6 %   Est. average glucose Bld gHb Est-mCnc 128 mg/dL       Objective:   Physical Exam Vitals signs reviewed.  Constitutional:      General: He is not in acute distress. HENT:     Head: Normocephalic and atraumatic.  Eyes:     General:        Right eye: No discharge.        Left eye: No discharge.  Neck:     Trachea: No tracheal deviation.  Cardiovascular:     Rate and Rhythm: Normal rate.     Heart sounds: Normal heart sounds. No murmur.  Pulmonary:     Effort: Pulmonary effort is normal. No respiratory distress.     Breath sounds: Normal breath sounds.  Lymphadenopathy:     Cervical: No cervical adenopathy.  Skin:    General: Skin is warm and dry.  Neurological:     Mental Status: He is alert.     Coordination: Coordination normal.  Psychiatric:        Behavior: Behavior normal.           Assessment & Plan:  Labs reviewed with patient in detail  1. Essential hypertension Blood pressure good control takes his medicine watches diet stays active  2. Chronic bilateral low back pain without sciatica Pain 3 prescriptions were written for the patient.  Urine drug screen on next visit The patient was seen in followup for chronic pain. A review over at their current pain status was discussed. Drug registry was checked. Prescriptions were given. Discussion was held regarding the importance of compliance with medication as well as pain medication contract.  Time for questions regarding pain management plan occurred.  Importance of regular followup visits was discussed. Patient was informed that medication may cause drowsiness and should not be combined  with other medications/alcohol or street drugs. Patient was cautioned that medication could cause drowsiness. If the patient feels medication is causing altered alertness then do not drive or operate dangerous equipment.    3. Morbid obesity ([place]) He is trying to lose weight very difficult for the patient to do so is try to watch diet trying  exercise some  4. Hypercholesteremia Cholesterol profile overall looks good continue current medications watch diet closely  5. Prediabetes Prediabetes overall doing well watch diet minimize starch  25 minutes was spent with the patient.  This statement verifies that 25 minutes was indeed spent with the patient.  More than 50% of this visit-total duration of the visit-was spent in counseling and coordination of care. The issues that the patient came in for today as reflected in the diagnosis (s) please refer to documentation for further details.

## 2019-01-13 NOTE — Telephone Encounter (Signed)
Please try to get a copy of the colonoscopy sent to [place] from Dr. Paulita Fujita who is a gastroenterologist in [place] I believe they are with [facility] gastroenterology thank you

## 2019-02-18 ENCOUNTER — Other Ambulatory Visit: Payer: Self-pay | Admitting: *Deleted

## 2019-02-18 DIAGNOSIS — Z20822 Contact with and (suspected) exposure to covid-19: Secondary | ICD-10-CM

## 2019-02-19 LAB — NOVEL CORONAVIRUS, NAA: SARS-CoV-2, NAA: NOT DETECTED

## 2019-02-21 ENCOUNTER — Telehealth: Payer: Self-pay | Admitting: Family Medicine

## 2019-02-21 NOTE — Telephone Encounter (Signed)
° °  Pt rec neg COVID results (wife)

## 2019-02-22 ENCOUNTER — Ambulatory Visit: Payer: PPO | Admitting: Physician Assistant

## 2019-03-28 ENCOUNTER — Other Ambulatory Visit: Payer: Self-pay

## 2019-03-28 ENCOUNTER — Telehealth: Payer: Self-pay | Admitting: Cardiovascular Disease

## 2019-03-28 ENCOUNTER — Ambulatory Visit (INDEPENDENT_AMBULATORY_CARE_PROVIDER_SITE_OTHER): Payer: PPO | Admitting: Cardiovascular Disease

## 2019-03-28 ENCOUNTER — Encounter: Payer: Self-pay | Admitting: Cardiovascular Disease

## 2019-03-28 VITALS — BP 138/90 | HR 88 | Ht 70.0 in | Wt 308.0 lb

## 2019-03-28 DIAGNOSIS — R55 Syncope and collapse: Secondary | ICD-10-CM

## 2019-03-28 DIAGNOSIS — I2510 Atherosclerotic heart disease of native coronary artery without angina pectoris: Secondary | ICD-10-CM

## 2019-03-28 DIAGNOSIS — I4819 Other persistent atrial fibrillation: Secondary | ICD-10-CM

## 2019-03-28 DIAGNOSIS — I350 Nonrheumatic aortic (valve) stenosis: Secondary | ICD-10-CM

## 2019-03-28 NOTE — Telephone Encounter (Signed)
Late entry from 3:45PM today: The patient's wife wishes to be called during Dr. Antionette Char appointment today. She wants to discuss with Dr. Burt Knack "episodes" the patient has been having where is almost passes out. Phone number: [phone]

## 2019-03-28 NOTE — Telephone Encounter (Signed)
Patients wife calling in regards to her husbands appt today with Dr. Burt Knack at 4:20. She would like to speak with his nurse, Valetta Fuller, before his appt. She states that there are some things she would like to discuss. Would not explain anything further than this

## 2019-03-28 NOTE — Patient Instructions (Signed)
Medication Instructions:  Your provider recommends that you continue on your current medications as directed. Please refer to the Current Medication list given to you today.   *If you need a refill on your cardiac medications before your next appointment, please call your pharmacy*  Testing/Procedures: Dr. Burt Knack recommends you wear a ZIO PATCH (heart monitor) for 2 weeks. This will be mailed to your home. Instructions to put the monitor on are in the box.  Follow-Up: You will be called to scheduled your 6 month echo and office visit with Dr. Burt Knack when his schedule for June 2021 is available.

## 2019-03-28 NOTE — Progress Notes (Signed)
Cardiology Office Note:    Date:  03/29/2019   ID:  KYHEEM BOULOS, DOB March 13, 1941, MRN [id number]  PCP:  Kathyrn Drown, MD  Cardiologist:  Sherren Mocha, MD  Electrophysiologist:  None   Referring MD: Kathyrn Drown, MD   Chief Complaint  Patient presents with  . Atrial Fibrillation    History of Present Illness:    Steven [name] is a 78 y.o. male with a hx of coronary artery disease and atrial fibrillation, presenting for follow-up evaluation.  The patient is here alone today.  His wife is conferenced and on speaker phone.  He has had a few recent episodes of lightheadedness and very transient unresponsiveness.  The first episode occurred about 1 month ago.  He had another episode that occurred this past weekend after he had done a good bit of work outdoors.  He noted that he worked harder than normal with a lot of sweating.  After he was back inside he was talking with his son and he became transiently unresponsive.  He slowly regained normal faculties within just a minute or 2.  He did not have frank syncope or loss of consciousness.  He has had some lightheadedness in the past but has never had frank syncope, nor has he had unresponsiveness as he has had recently.  He denies any recent chest pain or pressure.  His chronic dyspnea is unchanged.  He remains limited by orthopedic problems.  He has been compliant with anticoagulation for persistent atrial fibrillation.  He has had no focal motor deficits.  In review of his medications, he started Flomax for BPH about 4 to 6 weeks ago.  Past Medical History:  Diagnosis Date  . Chronic pain   . Complication of anesthesia    pt woke up during last surgery   . Coronary artery disease 02-27-11   Dr. Jacquiline Doe follows-has some blocked coronary arteries ,not suitable for stent  placement  . Dyslipidemia   . Dysrhythmia    hx of atrial fib during last hospitalization   . GERD (gastroesophageal reflux disease) 02-27-11   Acid  reflux  . Hearing loss 02-27-11   Bilateral hearing aids-due to exposure to loud machinery  . Hemorrhoids 02-27-11   not bothersome at this time  . Hyperlipidemia   . Hypertension   . Leg swelling   . Obesity   . Osteoarthritis 02-27-11   Ostearthritis-knees, shoulders.Back causes chronic pain-radiates down right leg  . Rash   . Seizures ([place]) 07/2014  . Shingles outbreak 02-27-11   2 weeks ago , was tx.-only residual is tenderness of right scalp-no open areas  . Shortness of breath    with exertion   . Urinary incontinence 02-27-11   not an everday occurrence-no special measures    Past Surgical History:  Procedure Laterality Date  . CHOLECYSTECTOMY  03/14/2011   Procedure: LAPAROSCOPIC CHOLECYSTECTOMY;  Surgeon: Edward Jolly, MD;  Location: [facility];  Service: General;  Laterality: N/A;  . COLONOSCOPY    . INJECTION KNEE  03/05/2011   Procedure: KNEE INJECTION;  Surgeon: Gearlean Alf;  Location: [facility];  Service: Orthopedics;  Laterality: Left;  80 mg depomedrol  . LAPAROSCOPY  03/16/2011   Procedure: LAPAROSCOPY DIAGNOSTIC;  Surgeon: Judieth Keens, DO;  Location: [facility];  Service: General;  Laterality: N/A;  repair incisional hernia  . REPLACEMENT TOTAL KNEE  02-27-11   right- 2007  . TOTAL KNEE ARTHROPLASTY  03/01/2012   Procedure: TOTAL KNEE ARTHROPLASTY;  Surgeon: Gearlean Alf, MD;  Location: [facility];  Service: Orthopedics;  Laterality: Left;  . TOTAL KNEE REVISION  03/05/2011   Procedure: TOTAL KNEE REVISION;  Surgeon: Dione Plover Aluisio;  Location: [facility];  Service: Orthopedics;  Laterality: Right;    Current Medications: Current Meds  Medication Sig  . atorvastatin (LIPITOR) 40 MG tablet Take 1 tablet (40 mg total) by mouth daily.  . cholecalciferol (VITAMIN D) 1000 UNITS tablet Take 1,000 Units by mouth daily.  . Cyanocobalamin (VITAMIN B-12 PO) Take 1,000 mg by mouth daily.  . diphenhydramine-acetaminophen (TYLENOL PM) 25-500 MG TABS tablet Take 1  tablet by mouth every evening.  . DULoxetine (CYMBALTA) 60 MG capsule Take 1 capsule (60 mg total) by mouth daily.  Marland Kitchen ELIQUIS 5 MG TABS tablet TAKE (1) TABLET BY MOUTH TWICE DAILY.  Marland Kitchen HYDROmorphone (DILAUDID) 2 MG tablet Take one tablet by mouth every 4 hours as needed for pain.  Marland Kitchen lisinopril (ZESTRIL) 5 MG tablet Take 1 tablet (5 mg total) by mouth daily.  . Omega-3 Fatty Acids (FISH OIL) 1000 MG CAPS Take 1,000 mg by mouth daily.  . pantoprazole (PROTONIX) 40 MG tablet TAKE (1) TABLET BY MOUTH ONCE DAILY.  . tamsulosin (FLOMAX) 0.4 MG CAPS capsule Take 1 capsule (0.4 mg total) by mouth daily.     Allergies:   Ciprofloxacin, Oxycodone-acetaminophen, and Oxycodone-acetaminophen   Social History   Socioeconomic History  . Marital status: Married    Spouse name: Not on file  . Number of children: 3  . Years of education: 32  . Highest education level: Not on file  Occupational History  . Occupation: Retired    Fish farm manager: RETIRED  Tobacco Use  . Smoking status: Former Smoker    Start date: 12/24/1961  . Smokeless tobacco: Former Systems developer    Types: [place] date: 01/27/1973  Substance and Sexual Activity  . Alcohol use: No  . Drug use: No  . Sexual activity: Yes    Birth control/protection: None  Other Topics Concern  . Not on file  Social History Narrative   Lives at home w/ his wife   Right-handed   Caffeine: some daily   Social Determinants of Health   Financial Resource Strain:   . Difficulty of Paying Living Expenses: Not on file  Food Insecurity:   . Worried About Charity fundraiser in the Last Year: Not on file  . Ran Out of Food in the Last Year: Not on file  Transportation Needs:   . Lack of Transportation (Medical): Not on file  . Lack of Transportation (Non-Medical): Not on file  Physical Activity:   . Days of Exercise per Week: Not on file  . Minutes of Exercise per Session: Not on file  Stress:   . Feeling of Stress : Not on file  Social Connections:     . Frequency of Communication with Friends and Family: Not on file  . Frequency of Social Gatherings with Friends and Family: Not on file  . Attends Religious Services: Not on file  . Active Member of Clubs or Organizations: Not on file  . Attends Archivist Meetings: Not on file  . Marital Status: Not on file     Family History: The patient's family history includes COPD in his father; Cancer in his father; Heart attack in his father; Heart attack (age of onset: 39) in his sister; Heart disease in his mother; Hypertension in his mother; Other (age of onset:  56) in his mother. There is no history of Seizures.  ROS:   Please see the history of present illness.    All other systems reviewed and are negative.  EKGs/Labs/Other Studies Reviewed:    The following studies were reviewed today: 2D echocardiogram 09/16/2017: Study Conclusions  - Left ventricle: The cavity size was normal. Wall thickness was   increased in a pattern of mild LVH. Systolic function was   vigorous. The estimated ejection fraction was in the range of 65%   to 70%. Wall motion was normal; there were no regional wall   motion abnormalities. The study was not technically sufficient to   allow evaluation of LV diastolic dysfunction due to atrial   fibrillation. - Aortic valve: Moderately calcified annulus. Trileaflet; mildly   thickened, mildly calcified leaflets. Morphologically, there   appears to be at least mild calcific aortic stenosis. - Aorta: Mild aortic root dilatation. - Mitral valve: Moderately calcified annulus. - Left atrium: The atrium was moderately dilated.  Stress Myoview scan 09/22/2017: Study Highlights   Nuclear stress EF: 64%. The left ventricular ejection fraction is normal (55-65%).  The study is normal.  This is a low risk study. no ischemia. no evidence of infarction    EKG:  EKG is  ordered today.  The ekg ordered today demonstrates atrial fibrillation with controlled  ventricular rate, otherwise normal  Recent Labs: 01/04/2019: ALT 12; BUN 17; Creatinine, Ser 0.99; Potassium 4.6; Sodium 138  Recent Lipid Panel    Component Value Date/Time   CHOL 126 01/04/2019 0804   TRIG 110 01/04/2019 0804   HDL 35 (L) 01/04/2019 0804   CHOLHDL 3.6 01/04/2019 0804   CHOLHDL 3.4 07/20/2015 0435   VLDL 19 07/20/2015 0435   LDLCALC 71 01/04/2019 0804    Physical Exam:    VS:  BP 138/90   Pulse 88   Ht 5\' 10"  (1.778 m)   Wt (!) 308 lb (139.7 kg)   SpO2 94%   BMI 44.19 kg/m     Wt Readings from Last 3 Encounters:  03/28/19 (!) 308 lb (139.7 kg)  01/12/19 (!) 311 lb (141.1 kg)  06/02/18 (!) 328 lb 3.2 oz (148.9 kg)     GEN:  Well nourished, well developed pleasant obese male in no acute distress HEENT: Normal NECK: No JVD; No carotid bruits LYMPHATICS: No lymphadenopathy CARDIAC: Irregularly irregular, 2/6 systolic murmur right upper sternal border RESPIRATORY:  Clear to auscultation without rales, wheezing or rhonchi  ABDOMEN: Soft, non-tender, non-distended MUSCULOSKELETAL:  No edema; No deformity  SKIN: Warm and dry NEUROLOGIC:  Alert and oriented x 3 PSYCHIATRIC:  Normal affect   ASSESSMENT:    1. Near syncope   2. Aortic valve stenosis, etiology of cardiac valve disease unspecified   3. Persistent atrial fibrillation ([place])   4. Coronary artery disease involving native coronary artery of native heart without angina pectoris    PLAN:    In order of problems listed above:  1. I have recommended an outpatient 2-week event monitor.  The patient has had normal left ventricular function in the past based on nuclear and echo assessment.  He has mild aortic stenosis.  He is on multiple antihypertensive medicines and also taking Flomax.  I wonder if hypotension is the cause of his spells, especially with the most recent episode occurring after he had worked outside and done a lot of sweating.  I talked to him about adequate fluid hydration.  Flomax has  helped his bladder outlet symptoms  quite a bit and he would like to continue on this for now.  We will review his monitor when available. 2. The patient has mild aortic stenosis.  We will repeat an echocardiogram at his next follow-up evaluation.  Last study was in 2019. 3. Heart rate is controlled on current therapy.  Tolerating anticoagulation.  He was concerned that his spells might have been TIAs, but I think this is pretty unlikely considering how transient his symptoms have been and that he has been compliant with anticoagulant therapy. 4. Stable on current medical therapy with no symptoms of angina.   Medication Adjustments/Labs and Tests Ordered: Current medicines are reviewed at length with the patient today.  Concerns regarding medicines are outlined above.  Orders Placed This Encounter  Procedures  . LONG TERM MONITOR (3-14 DAYS)  . EKG 12-Lead cs  . ECHOCARDIOGRAM COMPLETE   No orders of the defined types were placed in this encounter.   Patient Instructions  Medication Instructions:  Your provider recommends that you continue on your current medications as directed. Please refer to the Current Medication list given to you today.   *If you need a refill on your cardiac medications before your next appointment, please call your pharmacy*  Testing/Procedures: Dr. Burt Knack recommends you wear a ZIO PATCH (heart monitor) for 2 weeks. This will be mailed to your home. Instructions to put the monitor on are in the box.  Follow-Up: You will be called to scheduled your 6 month echo and office visit with Dr. Burt Knack when his schedule for June 2021 is available.    Signed, Sherren Mocha, MD  03/29/2019 9:26 AM    [facility]

## 2019-03-29 ENCOUNTER — Telehealth: Payer: Self-pay | Admitting: *Deleted

## 2019-03-29 NOTE — Telephone Encounter (Signed)
14 day ZIO XT long term holter monitor to be mailed to the patients home.  Instructions reviewed with wife per patients request.  Given telephone number for [employer] [phone] for package tracking.

## 2019-04-04 ENCOUNTER — Ambulatory Visit (INDEPENDENT_AMBULATORY_CARE_PROVIDER_SITE_OTHER): Payer: PPO

## 2019-04-04 DIAGNOSIS — R55 Syncope and collapse: Secondary | ICD-10-CM

## 2019-04-11 ENCOUNTER — Other Ambulatory Visit: Payer: Self-pay | Admitting: Cardiovascular Disease

## 2019-04-11 NOTE — Telephone Encounter (Signed)
Eliquis 5mg  refill request received, pt is 78yrs old, weight-139.7kg, Crea-0.99 on 01/04/2019, Diagnosis-Afib, and last seen by Dr. Burt Knack on 03/28/2019. Dose is appropriate based on dosing criteria. Will send in refill to requested pharmacy.

## 2019-04-12 ENCOUNTER — Other Ambulatory Visit: Payer: Self-pay

## 2019-04-12 ENCOUNTER — Ambulatory Visit (INDEPENDENT_AMBULATORY_CARE_PROVIDER_SITE_OTHER): Payer: PPO | Admitting: Family Medicine

## 2019-04-12 DIAGNOSIS — G8929 Other chronic pain: Secondary | ICD-10-CM | POA: Diagnosis not present

## 2019-04-12 DIAGNOSIS — M545 Low back pain: Secondary | ICD-10-CM | POA: Diagnosis not present

## 2019-04-12 MED ORDER — HYDROMORPHONE HCL 2 MG PO TABS: ORAL_TABLET | ORAL | 0 refills | Status: DC

## 2019-04-12 NOTE — Progress Notes (Signed)
Subjective:    Patient ID: Steven [name], male    DOB: 01/19/1941, 78 y.o.   MRN: [id number]  HPI This patient was seen today for chronic pain. Takes for knee and back pain He does state his reflux is under good control with the medication he takes 1 daily on a regular basis of the Protonix denies any dysphagia burning or discomfort  He states he has had some intermittent dizzy spells that lasts 10 to 30 seconds at a time no passing out with it his heart doctor is doing a monitor on him currently he relates it could be related to his Flomax but the patient states that helps his urine flow he states he has not had orthostatic blood pressures done on him  Patient does take his blood pressure medicine on a regular basis.  Watches salt in the diet denies swelling in the legs.  Denies low blood pressure.  Hyperlipidemia takes his medication on a regular basis watches his diet to some degree denies any problems with the medicine    States his Cymbalta is helping with his moods denies being depressed currently The medication list was reviewed and updated.   -Compliance with medication: takes 3 -4 a day Patient states on his good days he takes 3 on bad days he takes for he denies it causing drowsiness denies it causing nausea or vomiting states it allows him to function better without it he would have a difficult time getting by - Number patient states they take daily: takes 3 -4 a day  -when was the last dose patient took? today  The patient was advised the importance of maintaining medication and not using illegal substances with these.  Here for refills and follow up  The patient was educated that we can provide 3 monthly scripts for their medication, it is their responsibility to follow the instructions.  Side effects or complications from medications: none  Patient is aware that pain medications are meant to minimize the severity of the pain to allow their pain levels to improve to  allow for better function. They are aware of that pain medications cannot totally remove their pain.  Due for UDT ( at least once per year) : last one 06/02/18  Virtual Visit via Telephone Note  I connected with Steven [name] on 04/12/19 at  2:00 PM EST by telephone and verified that I am speaking with the correct person using two identifiers.  Location: Patient: home Provider: office   I discussed the limitations, risks, security and privacy concerns of performing an evaluation and management service by telephone and the availability of in person appointments. I also discussed with the patient that there may be a patient responsible charge related to this service. The patient expressed understanding and agreed to proceed.   History of Present Illness:    Observations/Objective:   Assessment and Plan:   Follow Up Instructions:    I discussed the assessment and treatment plan with the patient. The patient was provided an opportunity to ask questions and all were answered. The patient agreed with the plan and demonstrated an understanding of the instructions.   The patient was advised to call back or seek an in-person evaluation if the symptoms worsen or if the condition fails to improve as anticipated.  I provided 17 minutes of non-face-to-face time during this encounter.             Review of Systems  Constitutional: Negative for diaphoresis and fatigue.  HENT: Negative for congestion and rhinorrhea.   Respiratory: Negative for cough and shortness of breath.   Cardiovascular: Negative for chest pain and leg swelling.  Gastrointestinal: Negative for abdominal pain and diarrhea.  Skin: Negative for color change and rash.  Neurological: Negative for dizziness and headaches.  Psychiatric/Behavioral: Negative for behavioral problems and confusion.       Objective:     Today's visit was via telephone Physical exam was not possible for this visit      Assessment  & Plan:  The patient was seen in followup for chronic pain. A review over at their current pain status was discussed. Drug registry was checked. Prescriptions were given. Discussion was held regarding the importance of compliance with medication as well as pain medication contract.  Time for questions regarding pain management plan occurred. Importance of regular followup visits was discussed. Patient was informed that medication may cause drowsiness and should not be combined  with other medications/alcohol or street drugs. Patient was cautioned that medication could cause drowsiness. If the patient feels medication is causing altered alertness then do not drive or operate dangerous equipment.  His other chronic health issues are stable we will review in detail and follow-up in 3 months  Patient was told if he continues has dizzy spells to stop the Flomax and see if that helps he can certainly do a in person follow-up office visit if he would like it we will check orthostatics

## 2019-04-13 ENCOUNTER — Ambulatory Visit: Payer: PPO | Admitting: Family Medicine

## 2019-05-02 ENCOUNTER — Other Ambulatory Visit: Payer: Self-pay | Admitting: Family Medicine

## 2019-05-02 DIAGNOSIS — R55 Syncope and collapse: Secondary | ICD-10-CM | POA: Diagnosis not present

## 2019-05-07 ENCOUNTER — Other Ambulatory Visit: Payer: Self-pay | Admitting: Family Medicine

## 2019-05-09 ENCOUNTER — Ambulatory Visit: Payer: PPO | Admitting: Cardiovascular Disease

## 2019-05-24 ENCOUNTER — Other Ambulatory Visit: Payer: Self-pay | Admitting: Family Medicine

## 2019-06-27 ENCOUNTER — Ambulatory Visit (INDEPENDENT_AMBULATORY_CARE_PROVIDER_SITE_OTHER): Payer: PPO | Admitting: Family Medicine

## 2019-06-27 ENCOUNTER — Encounter: Payer: Self-pay | Admitting: Family Medicine

## 2019-06-27 ENCOUNTER — Other Ambulatory Visit: Payer: Self-pay

## 2019-06-27 VITALS — BP 124/78 | Temp 95.6°F | Wt 308.6 lb

## 2019-06-27 DIAGNOSIS — I951 Orthostatic hypotension: Secondary | ICD-10-CM

## 2019-06-27 DIAGNOSIS — E7800 Pure hypercholesterolemia, unspecified: Secondary | ICD-10-CM

## 2019-06-27 DIAGNOSIS — M545 Low back pain, unspecified: Secondary | ICD-10-CM

## 2019-06-27 DIAGNOSIS — G8929 Other chronic pain: Secondary | ICD-10-CM | POA: Diagnosis not present

## 2019-06-27 DIAGNOSIS — I10 Essential (primary) hypertension: Secondary | ICD-10-CM

## 2019-06-27 DIAGNOSIS — Z7901 Long term (current) use of anticoagulants: Secondary | ICD-10-CM | POA: Diagnosis not present

## 2019-06-27 DIAGNOSIS — R7303 Prediabetes: Secondary | ICD-10-CM | POA: Diagnosis not present

## 2019-06-27 MED ORDER — ATORVASTATIN CALCIUM 40 MG PO TABS: 40.0000 mg | ORAL_TABLET | Freq: Every day | ORAL | 1 refills | Status: DC

## 2019-06-27 MED ORDER — PANTOPRAZOLE SODIUM 40 MG PO TBEC: DELAYED_RELEASE_TABLET | ORAL | 1 refills | Status: DC

## 2019-06-27 MED ORDER — HYDROMORPHONE HCL 2 MG PO TABS: ORAL_TABLET | ORAL | 0 refills | Status: DC

## 2019-06-27 MED ORDER — LISINOPRIL 2.5 MG PO TABS: 5.0000 mg | ORAL_TABLET | Freq: Every day | ORAL | 1 refills | Status: DC

## 2019-06-27 MED ORDER — DULOXETINE HCL 60 MG PO CPEP: 60.0000 mg | ORAL_CAPSULE | Freq: Every day | ORAL | 1 refills | Status: DC

## 2019-06-27 MED ORDER — TAMSULOSIN HCL 0.4 MG PO CAPS: 0.4000 mg | ORAL_CAPSULE | Freq: Every day | ORAL | 1 refills | Status: DC

## 2019-06-27 NOTE — Progress Notes (Signed)
Subjective:    Patient ID: Steven [name], male    DOB: 1940/12/09, 79 y.o.   MRN: [id number]  HPI This patient was seen today for chronic pain  The medication list was reviewed and updated.   -Compliance with medication: Hydromorphone 2 mg  - Number patient states they take daily: 3 Occasionally the patient takes for Patient states the pain medicine does take the edge off his pain allows him to function When he tries to go below this amount the pain becomes too much to prevent him from functioning -when was the last dose patient took? This morning  The patient was advised the importance of maintaining medication and not using illegal substances with these.  Here for refills and follow up  The patient was educated that we can provide 3 monthly scripts for their medication, it is their responsibility to follow the instructions.  Side effects or complications from medications:none  Patient is aware that pain medications are meant to minimize the severity of the pain to allow their pain levels to improve to allow for better function. They are aware of that pain medications cannot totally remove their pain.  Due for UDT ( at least once per year) :   Fall Risk  06/27/2019 01/12/2019 11/11/2018 03/13/2017 07/22/2016  Falls in the past year? 0 0 (No Data) No No  Comment - - Emmi Telephone Survey: data to providers prior to load - -  Number falls in past yr: 0 - (No Data) - -  Comment - - Emmi Telephone Survey Actual Response =  - -  Injury with Fall? 0 - - - -  Follow up Falls evaluation completed - - - -   Essential hypertension - Plan: CBC with Differential, Basic Metabolic Panel (BMET), Hemoglobin A1c, Lipid Profile  Hypercholesteremia - Plan: CBC with Differential, Basic Metabolic Panel (BMET), Hemoglobin A1c, Lipid Profile  Chronic bilateral low back pain without sciatica - Plan: CBC with Differential, Basic Metabolic Panel (BMET), Hemoglobin A1c, Lipid Profile  Prediabetes -  Plan: CBC with Differential, Basic Metabolic Panel (BMET), Hemoglobin A1c, Lipid Profile  Morbid obesity ([place]) - Plan: CBC with Differential, Basic Metabolic Panel (BMET), Hemoglobin A1c, Lipid Profile  Orthostatic hypotension - Plan: CBC with Differential, Basic Metabolic Panel (BMET), Hemoglobin A1c, Lipid Profile  Chronic anticoagulation - Plan: CBC with Differential, Basic Metabolic Panel (BMET), Hemoglobin A1c, Lipid Profile  Current Outpatient Medications on File Prior to Visit  Medication Sig Dispense Refill  . cholecalciferol (VITAMIN D) 1000 UNITS tablet Take 1,000 Units by mouth daily.    . Cyanocobalamin (VITAMIN B-12 PO) Take 1,000 mg by mouth daily.    . diphenhydramine-acetaminophen (TYLENOL PM) 25-500 MG TABS tablet Take 1 tablet by mouth every evening.    Marland Kitchen ELIQUIS 5 MG TABS tablet TAKE (1) TABLET BY MOUTH TWICE DAILY. 60 tablet 10  . Omega-3 Fatty Acids (FISH OIL) 1000 MG CAPS Take 1,000 mg by mouth daily.     No current facility-administered medications on file prior to visit.    Patient does relate he has had a few spells recently where he was sitting and felt like also where he was standing and felt lightheaded no passing out currently but did have a passing out spell previously several years ago they never quite figured out what it was he relates compliance with his medicine and dietary habits   Review of Systems  Constitutional: Negative for diaphoresis and fatigue.  HENT: Negative for congestion and rhinorrhea.   Respiratory: Negative  for cough and shortness of breath.   Cardiovascular: Negative for chest pain and leg swelling.  Gastrointestinal: Negative for abdominal pain and diarrhea.  Musculoskeletal: Positive for back pain. Negative for arthralgias.  Skin: Negative for color change and rash.  Neurological: Positive for dizziness and light-headedness. Negative for headaches.  Psychiatric/Behavioral: Negative for behavioral problems and confusion.        Objective:   Physical Exam Lungs are clear respiratory rate normal heart irregular but rate controlled extremities no edema skin warm dry foot exam normal pulses normal abdomen soft no guarding rebound or tenderness       Assessment & Plan:  1. Essential hypertension Blood pressure under decent control today.  But he does have significant drop when he stands up see below - CBC with Differential - Basic Metabolic Panel (BMET) - Hemoglobin A1c - Lipid Profile  2. Hypercholesteremia Patient does take his cholesterol medicine regular basis previous labs look that new labs ordered await the results - CBC with Differential - Basic Metabolic Panel (BMET) - Hemoglobin A1c - Lipid Profile  3. Chronic bilateral low back pain without sciatica He has ongoing low back pain that makes it very difficult for him to function the pain medicine does allow him to go through the day drug registry was checked 3 scripts were sent in he takes primarily 3/day but occasionally for a day - CBC with Differential - Basic Metabolic Panel (BMET) - Hemoglobin A1c - Lipid Profile  4. Prediabetes We will check A1c because of his weight he is at risk of developing diabetes - CBC with Differential - Basic Metabolic Panel (BMET) - Hemoglobin A1c - Lipid Profile  5. Morbid obesity ([place]) Very important for him to watch portions stay active try to lose weight and spent very difficult for this patient to lose weight because of his age and underlying conditions - CBC with Differential - Basic Metabolic Panel (BMET) - Hemoglobin A1c - Lipid Profile  6. Orthostatic hypotension Patient's blood pressure does drop with standing we need to reduce the medication to lessen the chance of passing out blood pressure sitting was 124/78 blood pressure standing 96/72 contributing to this could be the Flomax as well but we will reduce lisinopril. - CBC with Differential - Basic Metabolic Panel (BMET) - Hemoglobin A1c -  Lipid Profile  7. Chronic anticoagulation We will check CBC he denies any bleeding issues sees cardiology on a yearly basis - CBC with Differential - Basic Metabolic Panel (BMET) - Hemoglobin A1c - Lipid Profile  30 minutes was spent with the patient including previsit chart review, time spent with patient, discussion of health issues, review of data including medical record, and documentation of the visit.   It is felt that the lightheaded spell more than likely related into too much medication could be compounding the effects of Flomax recommend reducing lisinopril

## 2019-06-28 DIAGNOSIS — E7800 Pure hypercholesterolemia, unspecified: Secondary | ICD-10-CM | POA: Diagnosis not present

## 2019-06-28 DIAGNOSIS — M545 Low back pain: Secondary | ICD-10-CM | POA: Diagnosis not present

## 2019-06-28 DIAGNOSIS — G8929 Other chronic pain: Secondary | ICD-10-CM | POA: Diagnosis not present

## 2019-06-28 DIAGNOSIS — Z7901 Long term (current) use of anticoagulants: Secondary | ICD-10-CM | POA: Diagnosis not present

## 2019-06-28 DIAGNOSIS — I10 Essential (primary) hypertension: Secondary | ICD-10-CM | POA: Diagnosis not present

## 2019-06-28 DIAGNOSIS — R7303 Prediabetes: Secondary | ICD-10-CM | POA: Diagnosis not present

## 2019-06-28 DIAGNOSIS — I951 Orthostatic hypotension: Secondary | ICD-10-CM | POA: Diagnosis not present

## 2019-06-29 LAB — LIPID PANEL
Chol/HDL Ratio: 3 ratio (ref 0.0–5.0)
Cholesterol, Total: 111 mg/dL (ref 100–199)
HDL: 37 mg/dL — ABNORMAL LOW (ref >39)
LDL Chol Calc (NIH): 56 mg/dL (ref 0–99)
Triglycerides: 92 mg/dL (ref 0–149)
VLDL Cholesterol Cal: 18 mg/dL (ref 5–40)

## 2019-06-29 LAB — BASIC METABOLIC PANEL
BUN/Creatinine Ratio: 16 (ref 10–24)
BUN: 16 mg/dL (ref 8–27)
CO2: 25 mmol/L (ref 20–29)
Calcium: 9 mg/dL (ref 8.6–10.2)
Chloride: 102 mmol/L (ref 96–106)
Creatinine, Ser: 0.98 mg/dL (ref 0.76–1.27)
GFR calc Af Amer: 86 mL/min/{1.73_m2} (ref >59)
GFR calc non Af Amer: 74 mL/min/{1.73_m2} (ref >59)
Glucose: 116 mg/dL — ABNORMAL HIGH (ref 65–99)
Potassium: 4.4 mmol/L (ref 3.5–5.2)
Sodium: 140 mmol/L (ref 134–144)

## 2019-06-29 LAB — CBC WITH DIFFERENTIAL/PLATELET
Basophils Absolute: 0 10*3/uL (ref 0.0–0.2)
Basos: 0 %
EOS (ABSOLUTE): 0.1 10*3/uL (ref 0.0–0.4)
Eos: 2 %
Hematocrit: 41.1 % (ref 37.5–51.0)
Hemoglobin: 14.4 g/dL (ref 13.0–17.7)
Immature Grans (Abs): 0 10*3/uL (ref 0.0–0.1)
Immature Granulocytes: 0 %
Lymphocytes Absolute: 1.3 10*3/uL (ref 0.7–3.1)
Lymphs: 20 %
MCH: 33 pg (ref 26.6–33.0)
MCHC: 35 g/dL (ref 31.5–35.7)
MCV: 94 fL (ref 79–97)
Monocytes Absolute: 0.5 10*3/uL (ref 0.1–0.9)
Monocytes: 7 %
Neutrophils Absolute: 4.7 10*3/uL (ref 1.4–7.0)
Neutrophils: 71 %
Platelets: 209 10*3/uL (ref 150–450)
RBC: 4.37 x10E6/uL (ref 4.14–5.80)
RDW: 12.2 % (ref 11.6–15.4)
WBC: 6.7 10*3/uL (ref 3.4–10.8)

## 2019-06-29 LAB — HEMOGLOBIN A1C
Est. average glucose Bld gHb Est-mCnc: 123 mg/dL
Hgb A1c MFr Bld: 5.9 % — ABNORMAL HIGH (ref 4.8–5.6)

## 2019-06-30 ENCOUNTER — Encounter: Payer: Self-pay | Admitting: Family Medicine

## 2019-08-24 ENCOUNTER — Telehealth: Payer: Self-pay | Admitting: Family Medicine

## 2019-08-24 NOTE — Telephone Encounter (Signed)
Wife called to make appt for 6 month follow up. She is wanting to know if he will need any blood work done before his appt. Scheduled appt for 06/14

## 2019-08-24 NOTE — Telephone Encounter (Signed)
Last labs 06/2019: Lipid, HgbA1c, Met 7 and CBC

## 2019-08-30 NOTE — Telephone Encounter (Signed)
Wife returned call and verbalized understanding.

## 2019-08-30 NOTE — Telephone Encounter (Signed)
Left message to return call 

## 2019-08-30 NOTE — Telephone Encounter (Signed)
No labs on this visit

## 2019-09-21 ENCOUNTER — Other Ambulatory Visit: Payer: Self-pay | Admitting: Family Medicine

## 2019-09-26 ENCOUNTER — Ambulatory Visit (INDEPENDENT_AMBULATORY_CARE_PROVIDER_SITE_OTHER): Payer: PPO | Admitting: Family Medicine

## 2019-09-26 ENCOUNTER — Other Ambulatory Visit: Payer: Self-pay

## 2019-09-26 VITALS — BP 120/78 | Temp 97.9°F | Ht 70.0 in | Wt 299.4 lb

## 2019-09-26 DIAGNOSIS — G459 Transient cerebral ischemic attack, unspecified: Secondary | ICD-10-CM | POA: Diagnosis not present

## 2019-09-26 DIAGNOSIS — G8929 Other chronic pain: Secondary | ICD-10-CM | POA: Diagnosis not present

## 2019-09-26 DIAGNOSIS — M545 Low back pain, unspecified: Secondary | ICD-10-CM

## 2019-09-26 DIAGNOSIS — I10 Essential (primary) hypertension: Secondary | ICD-10-CM

## 2019-09-26 DIAGNOSIS — R479 Unspecified speech disturbances: Secondary | ICD-10-CM | POA: Diagnosis not present

## 2019-09-26 DIAGNOSIS — R7303 Prediabetes: Secondary | ICD-10-CM

## 2019-09-26 DIAGNOSIS — R197 Diarrhea, unspecified: Secondary | ICD-10-CM

## 2019-09-26 MED ORDER — HYDROMORPHONE HCL 2 MG PO TABS: ORAL_TABLET | ORAL | 0 refills | Status: DC

## 2019-09-26 NOTE — Progress Notes (Signed)
Subjective:    Patient ID: Steven [name], male    DOB: 10-13-1940, 79 y.o.   MRN: [id number]  Hypertension This is a chronic problem. The current episode started more than 1 year ago. Pertinent negatives include no chest pain, headaches or shortness of breath. Risk factors for coronary artery disease include dyslipidemia and male gender. Treatments tried: zesteril 2.5 mg. There are no compliance problems.    Bowels frequently loose patient has had this off and on for a couple years.  Worse over the past couple months.  No blood no mucus.  Has not been on any antibiotics recently.  Also had spell where couldn't speak lasted less than a few seconds.  I spoke with his wife by phone she states that the spells occur he will seem out of bed but awake and unable to respond to her and it will take several moments before he is able to make any sense patient denies any unilateral numbness weakness denies any headaches.  Patient has been worked up in the past with a normal EEG is also had a MRI/MRA 4 years ago due to TIAs  This patient was seen today for chronic pain  The medication list was reviewed and updated.   -Compliance with medication: Patient does relate compliance states it does take the edge off his pain he tries to limit himself to 3/day sometimes has to take 4-day  - Number patient states they take daily: Typically 3 a day  -when was the last dose patient took?  Earlier today  The patient was advised the importance of maintaining medication and not using illegal substances with these.  Here for refills and follow up  The patient was educated that we can provide 3 monthly scripts for their medication, it is their responsibility to follow the instructions.  Side effects or complications from medications: Denies any side effects with the medicine  Patient is aware that pain medications are meant to minimize the severity of the pain to allow their pain levels to improve to allow for  better function. They are aware of that pain medications cannot totally remove their pain.  Due for UDT ( at least once per year) : On next visit     Fall Risk  09/26/2019 06/27/2019 01/12/2019 11/11/2018 03/13/2017  Falls in the past year? 0 0 0 (No Data) No  Comment - - - Emmi Telephone Survey: data to providers prior to load -  Number falls in past yr: - 0 - (No Data) -  Comment - - - Emmi Telephone Survey Actual Response =  -  Injury with Fall? - 0 - - -  Follow up Falls evaluation completed Falls evaluation completed - - -        Review of Systems  Constitutional: Negative for diaphoresis and fatigue.  HENT: Negative for congestion and rhinorrhea.   Respiratory: Negative for cough and shortness of breath.   Cardiovascular: Negative for chest pain and leg swelling.  Gastrointestinal: Positive for diarrhea. Negative for abdominal pain and nausea.  Skin: Negative for color change and rash.  Neurological: Negative for dizziness and headaches.  Psychiatric/Behavioral: Negative for behavioral problems and confusion.       Objective:   Physical Exam Vitals reviewed.  Constitutional:      General: He is not in acute distress. HENT:     Head: Normocephalic and atraumatic.  Eyes:     General:        Right eye: No discharge.  Left eye: No discharge.  Neck:     Trachea: No tracheal deviation.  Cardiovascular:     Rate and Rhythm: Normal rate and regular rhythm.     Heart sounds: Normal heart sounds. No murmur heard.   Pulmonary:     Effort: Pulmonary effort is normal. No respiratory distress.     Breath sounds: Normal breath sounds.  Lymphadenopathy:     Cervical: No cervical adenopathy.  Skin:    General: Skin is warm and dry.  Neurological:     Mental Status: He is alert.     Coordination: Coordination normal.  Psychiatric:        Behavior: Behavior normal.    Neurologic grossly normal       Assessment & Plan:  1. Essential hypertension Blood pressure  good control continue current measures  2. Morbid obesity ([place]) Patient has lost couple pounds encouraged to watch portions stay active  3. Chronic bilateral low back pain without sciatica Has ongoing low back pain not surgical.  Does use pain medicine cannot tolerate hydrocodone or oxycodone  4. Encounter for chronic pain management The patient was seen in followup for chronic pain. A review over at their current pain status was discussed. Drug registry was checked. Prescriptions were given.  Regular follow-up recommended. Discussion was held regarding the importance of compliance with medication as well as pain medication contract.  Patient was informed that medication may cause drowsiness and should not be combined  with other medications/alcohol or street drugs. If the patient feels medication is causing altered alertness then do not drive or operate dangerous equipment.  Drug registry checked 3 prescription sent in  5. Prediabetes Patient tries to watch starches in his diet.  6. Frequent diarrhea Gave him information on the low residue stool diet await culture and C. difficile - Stool Culture - Clostridium Difficile by PCR  7. TIA (transient ischemic attack) Possible TIA possible absence seizure more likely TIA recommend MRI and MRA as well as carotid ultrasound will also need get in with neurology.  Await the results of the MR and ultrasound - MR Brain Wo Contrast - MR ANGIO HEAD WO CONTRAST - US Carotid Bilateral

## 2019-09-26 NOTE — Patient Instructions (Addendum)
Elenore Rota It was good to see you today  We covered several items #1 your blood pressure is very good 120/78 continue to eat healthy and try to stay active #2 you are starting to lose some weight so continue to watch your portions, any weight loss is beneficial #3 the diarrhea needs further looking into including stool studies.  Certainly if you start seeing blood in your stool or any other worrisome symptoms you need to alert me of this. #4 these transient spells you are having could be a TIA which is a threatened stroke.  It is less likely to be a seizure.  Typically with this we recommend doing MRI MRA as well as carotid ultrasounds plus also we will touch base with the neurologist you saw few years ago.  They will want to see you.  Continue to utilize your blood thinner Eliquis. #5 your pain medications were refilled.  I agree with you to try to stick with 3/day.  If necessary you may do 4 if the pain is severe.  If you are finding that you are able to tolerate it you may even cut lower than 3/day but more than likely would have to do that has 1/2 tablet-in other words 2-1/2 tablets a day We are here to support you-should you have questions regarding any of this please ask [place]  Also as I discussed with you we will recheck A1c and cholesterol again in the fall Please follow-up in 3 months. Thanks-Dr. Nicki Reaper

## 2019-09-27 ENCOUNTER — Telehealth: Payer: Self-pay | Admitting: Cardiovascular Disease

## 2019-09-27 NOTE — Telephone Encounter (Signed)
Informed wife she may come with the patient to his visit if he agrees. She was grateful for assistance.

## 2019-09-27 NOTE — Telephone Encounter (Signed)
New Message:     Wife called and said she will need to come in with pt for his appt with Dr Copper on Friday. She said pt have been having some episodes and she need to explain them to Dr Burt Knack.Marland Kitchen

## 2019-09-30 ENCOUNTER — Other Ambulatory Visit: Payer: Self-pay

## 2019-09-30 ENCOUNTER — Encounter: Payer: Self-pay | Admitting: Cardiovascular Disease

## 2019-09-30 ENCOUNTER — Ambulatory Visit: Payer: PPO | Admitting: Cardiovascular Disease

## 2019-09-30 ENCOUNTER — Ambulatory Visit (HOSPITAL_COMMUNITY): Payer: PPO | Attending: Cardiology

## 2019-09-30 VITALS — BP 122/80 | HR 73 | Ht 70.0 in | Wt 298.6 lb

## 2019-09-30 DIAGNOSIS — I350 Nonrheumatic aortic (valve) stenosis: Secondary | ICD-10-CM | POA: Insufficient documentation

## 2019-09-30 DIAGNOSIS — I2510 Atherosclerotic heart disease of native coronary artery without angina pectoris: Secondary | ICD-10-CM | POA: Diagnosis not present

## 2019-09-30 DIAGNOSIS — R55 Syncope and collapse: Secondary | ICD-10-CM

## 2019-09-30 DIAGNOSIS — I4819 Other persistent atrial fibrillation: Secondary | ICD-10-CM

## 2019-09-30 NOTE — Progress Notes (Signed)
Cardiology Office Note:    Date:  09/30/2019   ID:  Park Breed, DOB 08-25-1940, MRN [id number]  PCP:  Kathyrn Drown, MD  [place] Cardiologist:  Sherren Mocha, MD  [facility] Electrophysiologist:  None   Referring MD: Kathyrn Drown, MD   Chief Complaint  Patient presents with  . Coronary Artery Disease   History of Present Illness:    Steven [name] is a 79 y.o. male with a hx of  coronary artery disease and atrial fibrillation, presenting for follow-up evaluation.  At his previous office visit he had noted some episodes of transient unresponsiveness.  A 2-week event monitor was done and demonstrated well-controlled atrial fibrillation with an average heart rate of 75 bpm with occasional wide-complex beats but no evidence of heart block or pathologic pauses.  The patient is here with his wife.  He continues to have occasional episodes of unresponsiveness generally when he is sitting in a chair.  States that he can hear people but he is unable to respond.  These are fairly short-lived and he does not have a prolonged recovery from them.  He has not had frank syncope.  He denies orthopnea, PND, or chest pain.  The patient has mild shortness of breath with activity and this is not worsened with time.  Past Medical History:  Diagnosis Date  . Chronic pain   . Complication of anesthesia    pt woke up during last surgery   . Coronary artery disease 02-27-11   Dr. Jacquiline Doe follows-has some blocked coronary arteries ,not suitable for stent  placement  . Dyslipidemia   . Dysrhythmia    hx of atrial fib during last hospitalization   . GERD (gastroesophageal reflux disease) 02-27-11   Acid reflux  . Hearing loss 02-27-11   Bilateral hearing aids-due to exposure to loud machinery  . Hemorrhoids 02-27-11   not bothersome at this time  . Hyperlipidemia   . Hypertension   . Leg swelling   . Obesity   . Osteoarthritis 02-27-11   Ostearthritis-knees, shoulders.Back  causes chronic pain-radiates down right leg  . Rash   . Seizures ([place]) 07/2014  . Shingles outbreak 02-27-11   2 weeks ago , was tx.-only residual is tenderness of right scalp-no open areas  . Shortness of breath    with exertion   . Urinary incontinence 02-27-11   not an everday occurrence-no special measures    Past Surgical History:  Procedure Laterality Date  . CHOLECYSTECTOMY  03/14/2011   Procedure: LAPAROSCOPIC CHOLECYSTECTOMY;  Surgeon: Edward Jolly, MD;  Location: [facility];  Service: General;  Laterality: N/A;  . COLONOSCOPY    . INJECTION KNEE  03/05/2011   Procedure: KNEE INJECTION;  Surgeon: Gearlean Alf;  Location: [facility];  Service: Orthopedics;  Laterality: Left;  80 mg depomedrol  . LAPAROSCOPY  03/16/2011   Procedure: LAPAROSCOPY DIAGNOSTIC;  Surgeon: Judieth Keens, DO;  Location: [facility];  Service: General;  Laterality: N/A;  repair incisional hernia  . REPLACEMENT TOTAL KNEE  02-27-11   right- 2007  . TOTAL KNEE ARTHROPLASTY  03/01/2012   Procedure: TOTAL KNEE ARTHROPLASTY;  Surgeon: Gearlean Alf, MD;  Location: [facility];  Service: Orthopedics;  Laterality: Left;  . TOTAL KNEE REVISION  03/05/2011   Procedure: TOTAL KNEE REVISION;  Surgeon: Dione Plover Aluisio;  Location: [facility];  Service: Orthopedics;  Laterality: Right;    Current Medications: Current Meds  Medication Sig  . atorvastatin (LIPITOR) 40 MG  tablet Take 1 tablet (40 mg total) by mouth daily.  . cholecalciferol (VITAMIN D) 1000 UNITS tablet Take 1,000 Units by mouth daily.  . Cyanocobalamin (VITAMIN B-12 PO) Take 1,000 mg by mouth daily.  . diphenhydramine-acetaminophen (TYLENOL PM) 25-500 MG TABS tablet Take 1 tablet by mouth every evening.  . DULoxetine (CYMBALTA) 60 MG capsule Take 1 capsule (60 mg total) by mouth daily.  Marland Kitchen ELIQUIS 5 MG TABS tablet TAKE (1) TABLET BY MOUTH TWICE DAILY.  Marland Kitchen HYDROmorphone (DILAUDID) 2 MG tablet Take one tablet by mouth every 4 hours as needed for pain.  Marland Kitchen  lisinopril (ZESTRIL) 2.5 MG tablet Take 2 tablets (5 mg total) by mouth daily.  . Omega-3 Fatty Acids (FISH OIL) 1000 MG CAPS Take 1,000 mg by mouth daily.  . pantoprazole (PROTONIX) 40 MG tablet TAKE (1) TABLET BY MOUTH ONCE DAILY.  . Probiotic Product (ALIGN PO) Take by mouth every morning.  . tamsulosin (FLOMAX) 0.4 MG CAPS capsule TAKE ONE CAPSULE BY MOUTH ONCE DAILY.     Allergies:   Ciprofloxacin, Oxycodone-acetaminophen, and Oxycodone-acetaminophen   Social History   Socioeconomic History  . Marital status: Married    Spouse name: Not on file  . Number of children: 3  . Years of education: 16  . Highest education level: Not on file  Occupational History  . Occupation: Retired    Fish farm manager: RETIRED  Tobacco Use  . Smoking status: Former Smoker    Start date: 12/24/1961  . Smokeless tobacco: Former Systems developer    Types: [place] date: 01/27/1973  Vaping Use  . Vaping Use: Never used  Substance and Sexual Activity  . Alcohol use: No  . Drug use: No  . Sexual activity: Yes    Birth control/protection: None  Other Topics Concern  . Not on file  Social History Narrative   Lives at home w/ his wife   Right-handed   Caffeine: some daily   Social Determinants of Health   Financial Resource Strain:   . Difficulty of Paying Living Expenses:   Food Insecurity:   . Worried About Charity fundraiser in the Last Year:   . Arboriculturist in the Last Year:   Transportation Needs:   . Film/video editor (Medical):   Marland Kitchen Lack of Transportation (Non-Medical):   Physical Activity:   . Days of Exercise per Week:   . Minutes of Exercise per Session:   Stress:   . Feeling of Stress :   Social Connections:   . Frequency of Communication with Friends and Family:   . Frequency of Social Gatherings with Friends and Family:   . Attends Religious Services:   . Active Member of Clubs or Organizations:   . Attends Archivist Meetings:   Marland Kitchen Marital Status:      Family  History: The patient's family history includes COPD in his father; Cancer in his father; Heart attack in his father; Heart attack (age of onset: 65) in his sister; Heart disease in his mother; Hypertension in his mother; Other (age of onset: 74) in his mother. There is no history of Seizures.  ROS:   Please see the history of present illness.    All other systems reviewed and are negative.  EKGs/Labs/Other Studies Reviewed:    The following studies were reviewed today: The patient's echocardiogram from this morning is personally reviewed.  He has what appears to be normal LV systolic function.  The aortic valve is calcified  with mild leaflet restriction.  The peak transaortic velocity is 2.4 to 2.6 m/s, peak and mean transaortic gradients are 28 and 16 mmHg, respectively.  This is consistent with mild aortic stenosis.  EKG:  EKG is ordered today.  The ekg ordered today demonstrates atrial fibrillation 73 bpm, otherwise within normal limits  Recent Labs: 01/04/2019: ALT 12 06/28/2019: BUN 16; Creatinine, Ser 0.98; Hemoglobin 14.4; Platelets 209; Potassium 4.4; Sodium 140  Recent Lipid Panel    Component Value Date/Time   CHOL 111 06/28/2019 0804   TRIG 92 06/28/2019 0804   HDL 37 (L) 06/28/2019 0804   CHOLHDL 3.0 06/28/2019 0804   CHOLHDL 3.4 07/20/2015 0435   VLDL 19 07/20/2015 0435   LDLCALC 56 06/28/2019 0804    Physical Exam:    VS:  BP 122/80 (BP Location: Left Arm, Patient Position: Sitting, Cuff Size: Large)   Pulse 73   Ht 5\' 10"  (1.778 m)   Wt 298 lb 9.6 oz (135.4 kg)   SpO2 95%   BMI 42.84 kg/m     Wt Readings from Last 3 Encounters:  09/30/19 298 lb 9.6 oz (135.4 kg)  09/26/19 299 lb 6.4 oz (135.8 kg)  06/27/19 (!) 308 lb 9.6 oz (140 kg)     GEN:  Well nourished, well developed obese male in no acute distress HEENT: Normal NECK: No JVD; No carotid bruits LYMPHATICS: No lymphadenopathy CARDIAC: irregularly irregular, 2/6 systolic murmur at the  RUSB RESPIRATORY:  Clear to auscultation without rales, wheezing or rhonchi  ABDOMEN: Soft, non-tender, non-distended MUSCULOSKELETAL:  Trace bilateral pretibial edema; No deformity  SKIN: Warm and dry NEUROLOGIC:  Alert and oriented x 3 PSYCHIATRIC:  Normal affect   ASSESSMENT:    1. Nonrheumatic aortic valve stenosis   2. Coronary artery disease involving native coronary artery of native heart without angina pectoris   3. Persistent atrial fibrillation ([place])   4. Near syncope    PLAN:    In order of problems listed above:  1. The patient has mild aortic stenosis.  He has no symptoms related to this.  I would anticipate a repeat echocardiogram in about 2 years. 2. Stable without symptoms of angina.  No antiplatelet therapy in the setting of chronic oral anticoagulation. 3. Anticoagulated with apixaban, heart rate well controlled. 4. Event monitor reviewed with no significant heart block or pathologic pauses.  Well-controlled heart rate noted with permanent atrial fibrillation.  Episodes sound more like a neurologic event such as a partial complex seizure.  I reviewed notes from primary care and it appears there will be a brain MRI and referral to neurology.  I agree this is a good direction to go with his evaluation.   Medication Adjustments/Labs and Tests Ordered: Current medicines are reviewed at length with the patient today.  Concerns regarding medicines are outlined above.  Orders Placed This Encounter  Procedures  . EKG 12-Lead   No orders of the defined types were placed in this encounter.   Patient Instructions  Medication Instructions:  Your provider recommends that you continue on your current medications as directed. Please refer to the Current Medication list given to you today.   *If you need a refill on your cardiac medications before your next appointment, please call your pharmacy*  Follow-Up: At [facility], you and your health needs are our priority.  As  part of our continuing mission to provide you with exceptional heart care, we have created designated Provider Care Teams.  These Care Teams include your  primary Cardiologist (physician) and Advanced Practice Providers (APPs -  Physician Assistants and Nurse Practitioners) who all work together to provide you with the care you need, when you need it. Your next appointment:   12 month(s) The format for your next appointment:   In Person Provider:   You may see Sherren Mocha, MD or one of the following Advanced Practice Providers on your designated Care Team:    Richardson Dopp, PA-C  Robbie Lis, [place]      Signed, Sherren Mocha, MD  09/30/2019 1:25 PM    [facility]

## 2019-09-30 NOTE — Patient Instructions (Signed)

## 2019-10-05 DIAGNOSIS — R197 Diarrhea, unspecified: Secondary | ICD-10-CM | POA: Diagnosis not present

## 2019-10-06 ENCOUNTER — Other Ambulatory Visit: Payer: Self-pay

## 2019-10-06 ENCOUNTER — Ambulatory Visit (HOSPITAL_COMMUNITY)
Admission: RE | Admit: 2019-10-06 | Discharge: 2019-10-06 | Disposition: A | Discharge status: Home / Self Care | Payer: PPO | Source: Ambulatory Visit | Attending: Family Medicine | Admitting: Family Medicine

## 2019-10-06 DIAGNOSIS — G459 Transient cerebral ischemic attack, unspecified: Secondary | ICD-10-CM | POA: Diagnosis not present

## 2019-10-06 DIAGNOSIS — I6523 Occlusion and stenosis of bilateral carotid arteries: Secondary | ICD-10-CM | POA: Diagnosis not present

## 2019-10-07 LAB — CLOSTRIDIUM DIFFICILE BY PCR: Toxigenic C. Difficile by PCR: NEGATIVE

## 2019-10-09 LAB — STOOL CULTURE: E coli, Shiga toxin Assay: NEGATIVE

## 2019-10-15 ENCOUNTER — Inpatient Hospital Stay (HOSPITAL_COMMUNITY): Admission: RE | Admit: 2019-10-15 | Payer: PPO | Source: Ambulatory Visit

## 2019-10-15 ENCOUNTER — Other Ambulatory Visit: Payer: Self-pay

## 2019-10-15 ENCOUNTER — Ambulatory Visit (HOSPITAL_COMMUNITY)
Admission: RE | Admit: 2019-10-15 | Discharge: 2019-10-15 | Disposition: A | Discharge status: Home / Self Care | Payer: PPO | Source: Ambulatory Visit | Attending: Family Medicine | Admitting: Family Medicine

## 2019-10-15 DIAGNOSIS — R569 Unspecified convulsions: Secondary | ICD-10-CM | POA: Diagnosis not present

## 2019-10-15 DIAGNOSIS — G459 Transient cerebral ischemic attack, unspecified: Secondary | ICD-10-CM | POA: Insufficient documentation

## 2019-10-18 NOTE — Addendum Note (Signed)
Addended by: Dairl Ponder on: 10/18/2019 04:51 PM   Modules accepted: Orders

## 2019-10-18 NOTE — Addendum Note (Signed)
Addended by: Dairl Ponder on: 10/18/2019 04:17 PM   Modules accepted: Orders

## 2019-10-19 ENCOUNTER — Other Ambulatory Visit: Payer: Self-pay | Admitting: Family Medicine

## 2019-10-24 DIAGNOSIS — H26493 Other secondary cataract, bilateral: Secondary | ICD-10-CM | POA: Diagnosis not present

## 2019-10-24 DIAGNOSIS — H43813 Vitreous degeneration, bilateral: Secondary | ICD-10-CM | POA: Diagnosis not present

## 2019-10-24 DIAGNOSIS — H33301 Unspecified retinal break, right eye: Secondary | ICD-10-CM | POA: Diagnosis not present

## 2019-10-25 ENCOUNTER — Encounter: Payer: Self-pay | Admitting: Family Medicine

## 2019-12-22 ENCOUNTER — Telehealth: Payer: Self-pay | Admitting: Neurology

## 2019-12-22 ENCOUNTER — Encounter: Payer: Self-pay | Admitting: Neurology

## 2019-12-22 ENCOUNTER — Ambulatory Visit: Payer: PPO | Admitting: Neurology

## 2019-12-22 VITALS — BP 146/90 | HR 70 | Ht 70.0 in | Wt 296.0 lb

## 2019-12-22 DIAGNOSIS — R404 Transient alteration of awareness: Secondary | ICD-10-CM | POA: Insufficient documentation

## 2019-12-22 DIAGNOSIS — G40209 Localization-related (focal) (partial) symptomatic epilepsy and epileptic syndromes with complex partial seizures, not intractable, without status epilepticus: Secondary | ICD-10-CM | POA: Diagnosis not present

## 2019-12-22 MED ORDER — VIMPAT 100 MG PO TABS: 100.0000 mg | ORAL_TABLET | Freq: Two times a day (BID) | ORAL | 3 refills | Status: DC

## 2019-12-22 NOTE — Patient Instructions (Signed)
Start Vimpat 100mg  twice daily EEG here in the office and then 3-day EEG at home  Per [facility] statutes, patients with seizures are not allowed to drive until they have been seizure-free for six months.    Use caution when using heavy equipment or power tools. Avoid working on ladders or at heights. Take showers instead of baths. Ensure the water temperature is not too high on the home water heater. Do not go swimming alone. Do not lock yourself in a room alone (i.e. bathroom). When caring for infants or small children, sit down when holding, feeding, or changing them to minimize risk of injury to the child in the event you have a seizure. Maintain good sleep hygiene. Avoid alcohol.    If patient has another seizure, call 911 and bring them back to the ED if: A.  The seizure lasts longer than 5 minutes.      B.  The patient doesn't wake shortly after the seizure or has new problems such as difficulty seeing, speaking or moving following the seizure C.  The patient was injured during the seizure D.  The patient has a temperature over 102 F (39C) E.  The patient vomited during the seizure and now is having trouble breathing  Per [facility] statutes, patients with seizures are not allowed to drive until they have been seizure-free for six months.  Other recommendations include using caution when using heavy equipment or power tools. Avoid working on ladders or at heights. Take showers instead of baths.  Do not swim alone.  Ensure the water temperature is not too high on the home water heater. Do not go swimming alone. Do not lock yourself in a room alone (i.e. bathroom). When caring for infants or small children, sit down when holding, feeding, or changing them to minimize risk of injury to the child in the event you have a seizure. Maintain good sleep hygiene. Avoid alcohol.  Also recommend adequate sleep, hydration, good diet and minimize stress.  During the Seizure  - First,  ensure adequate ventilation and place patients on the floor on their left side  Loosen clothing around the neck and ensure the airway is patent. If the patient is clenching the teeth, do not force the mouth open with any object as this can cause severe damage - Remove all items from the surrounding that can be hazardous. The patient may be oblivious to what's happening and may not even know what he or she is doing. If the patient is confused and wandering, either gently guide him/her away and block access to outside areas - Reassure the individual and be comforting - Call 911. In most cases, the seizure ends before EMS arrives. However, there are cases when seizures may last over 3 to 5 minutes. Or the individual may have developed breathing difficulties or severe injuries. If a pregnant patient or a person with diabetes develops a seizure, it is prudent to call an ambulance. - Finally, if the patient does not regain full consciousness, then call EMS. Most patients will remain confused for about 45 to 90 minutes after a seizure, so you must use judgment in calling for help. - Avoid restraints but make sure the patient is in a bed with padded side rails - Place the individual in a lateral position with the neck slightly flexed; this will help the saliva drain from the mouth and prevent the tongue from falling backward - Remove all nearby furniture and other hazards from the  area - Provide verbal assurance as the individual is regaining consciousness - Provide the patient with privacy if possible - Call for help and start treatment as ordered by the caregiver   fter the Seizure (Postictal Stage)  After a seizure, most patients experience confusion, fatigue, muscle pain and/or a headache. Thus, one should permit the individual to sleep. For the next few days, reassurance is essential. Being calm and helping reorient the person is also of importance.  Most seizures are painless and end spontaneously.  Seizures are not harmful to others but can lead to complications such as stress on the lungs, brain and the heart. Individuals with prior lung problems may develop labored breathing and respiratory distress.   Lacosamide tablets What is this medicine? LACOSAMIDE (la KOE sa mide) is used to control seizures caused by certain types of epilepsy. This medicine may be used for other purposes; ask your health care provider or pharmacist if you have questions. COMMON BRAND NAME(S): Vimpat What should I tell my health care provider before I take this medicine? They need to know if you have any of these conditions:  drug abuse or addiction  heart disease  kidney disease  liver disease  suicidal thoughts, plans, or attempt; a previous suicide attempt by you or a family member  an unusual or allergic reaction to lacosamide, other medicines, foods, dyes, or preservatives  pregnant or trying to get pregnant  breast-feeding How should I use this medicine? Take this medicine by mouth with a glass of water. Follow the directions on the prescription label. Do not cut, crush or chew this medicine. Swallow tablets whole. You can take it with or without food. If it upsets your stomach, take it with food. Take your medicine at regular intervals. Do not take it more often than directed. Do not stop taking except on your doctor's advice. A special MedGuide will be given to you by the pharmacist with each prescription and refill. Be sure to read this information carefully each time. Talk to your pediatrician regarding the use of this medicine in children. While this drug may be prescribed for children as young as 38 years of age for selected conditions, precautions do apply. Overdosage: If you think you have taken too much of this medicine contact a poison control center or emergency room at once. NOTE: This medicine is only for you. Do not share this medicine with others. What if I miss a dose? If you miss a  dose, take it as soon as you can. If it is almost time for your next dose, take only that dose. Do not take double or extra doses. What may interact with this medicine? This medicine may interact with the following medications:  atazanavir  beta-blockers like metoprolol and propranolol  calcium channel blockers like diltiazem and verapamil  certain medicines for irregular heart beat like amiodarone, bepridil, dofetilide, encainide, flecainide, propafenone, quinidine  certain medicines for seizures like carbamazepine, phenytoin  digoxin  dronedarone  lopinavir/ritonavir This list may not describe all possible interactions. Give your health care provider a list of all the medicines, herbs, non-prescription drugs, or dietary supplements you use. Also tell them if you smoke, drink alcohol, or use illegal drugs. Some items may interact with your medicine. What should I watch for while using this medicine? Visit your doctor or health care provider for regular checks on your progress. This medicine needs careful monitoring. This medicine may cause serious skin reactions. They can happen weeks to months after starting the  medicine. Contact your health care provider right away if you notice fevers or flu-like symptoms with a rash. The rash may be red or purple and then turn into blisters or peeling of the skin. Or, you might notice a red rash with swelling of the face, lips or lymph nodes in your neck or under your arms. Wear a medical ID bracelet or chain, and carry a card that describes your disease and details of your medicine and dosage times. You may get drowsy or dizzy. Do not drive, use machinery, or do anything that needs mental alertness until you know how this medicine affects you. Do not stand or sit up quickly, especially if you are an older patient. This reduces the risk of dizzy or fainting spells. Alcohol may interfere with the effect of this medicine. Avoid alcoholic drinks. The use  of this medicine may increase the chance of suicidal thoughts or actions. Pay special attention to how you are responding while on this medicine. Any worsening of mood, or thoughts of suicide or dying should be reported to your health care provider right away. Women who become pregnant while using this medicine may enroll in the [place] Pregnancy Registry by calling [phone]. This registry collects information about the safety of antiepileptic drug use during pregnancy. What side effects may I notice from receiving this medicine? Side effects that you should report to your doctor or health care professional as soon as possible:  allergic reactions like skin rash, itching or hives, swelling of the face, lips, or tongue  rash, fever, and swollen lymph nodes  signs and symptoms of a dangerous change in heartbeat or heart rhythm like chest pain; dizziness; fast, irregular heartbeat; palpitations; feeling faint or lightheaded; falls; breathing problems  signs and symptoms of liver injury like dark yellow or brown urine; general ill feeling or flu-like symptoms; light-colored stools; loss of appetite; nausea; right upper belly pain; unusually weak or tired; yellowing of the eyes or skin  suicidal thoughts, mood changes Side effects that usually do not require medical attention (report to your doctor or health care professional if they continue or are bothersome):  blurred vision  dizziness  drowsiness  headache  nausea This list may not describe all possible side effects. Call your doctor for medical advice about side effects. You may report side effects to FDA at 1-800-FDA-1088. Where should I keep my medicine? Keep out of the reach of children. This medicine can be abused. Keep your medicine in a safe place to protect it from theft. Do not share this medicine with anyone. Selling or giving away this medicine is dangerous and against the law. This medicine may  cause harm and death if it is taken by other adults, children, or pets. Return medicine that has not been used to an official disposal site. Contact the DEA at (680)643-6979 or your city/county government to find a site. If you cannot return the medicine, mix any unused medicine with a substance like cat litter or coffee grounds. Then throw the medicine away in a sealed container like a sealed bag or coffee can with a lid. Do not use the medicine after the expiration date. Store at room temperature between 15 and 30 degrees C (59 and 86 degrees F). NOTE: This sheet is a summary. It may not cover all possible information. If you have questions about this medicine, talk to your doctor, pharmacist, or health care provider.  2020 [employer] Standard (2018-07-02 14:55:28)

## 2019-12-22 NOTE — Progress Notes (Signed)
[facility]    Provider:  Dr Jaynee Eagles Requesting Provider: Kathyrn Drown, MD Primary Care Provider:  Kathyrn Drown, MD  CC:  Speech disturbance  HPI:  Steven [name] is a 79 y.o. male here as requested by Kathyrn Drown, MD for speech disturbance. PMHx obesity, hypertension, diabetes, chronic low back pain, seizure disorder for which he has been seen in the past, he has a history of seizure of April 2017, he could not tolerate Keppra, repeat EEG was normal in the past, he stopped his Keppra and declined switching to another seizure medication.  From a review of notes he also discussed fatigue which may be due to multiple reasons including polypharmacy, obesity, lifestyle and depression.  The seizure was in 2016/2017, wife walked in and he was jerking all four limbs, drooling on the sides of the mouth, eyes closed, he became combative and "out of it", since he stopped Keppra last time I saw him he had not had a repeat event.  Seizure was unprovoked.  I reviewed Dr. Lance Sell notes concerning possible TIA.  Patient had a spell where he could not speak lasted less than a few seconds, the spells occur he will seem out of it but awake and unable to respond to her and will take several moments before is able to make any sense, patient denied any unilateral numbness weakness, any headaches, he has been worked up in the past with a normal EEG and is also had an MRI MRA  4 years ago due to TIAs.  Dr. Wolfgang Phoenix  e ordered MRI of the brain with and without contrast, MR angio of the head without contrast and ultrasound carotid bilaterally.  Wife gives most information, back in November he had "spells" he was not there, confused, wasn;t understanding, just a few seconds and then he would be ok. They would happen during the day, he had  Few of them unknown how many, he saw his cardiologist in December and told him about it and he was on a heart monitor for a while. He is compliant with his  Eliquis. In March he had several more, he doesn't pass out, he doesn't lose consciousness, one time he wa at the kitchen table and she asked him multiple questions and she kept asking him the same question and when he answered it didn't make sense. The most recent was in August, he did not remember it, lasted a minute or so, afterwards it is just normal. He has sleep apnea and compliant.   Reviewed notes, labs and imaging from outside physicians, which showed:  MRI brain/MRA head 10/15/2019: personally reviewed and agree with the following  IMPRESSION: 1. No acute intracranial abnormality. 2. Mild chronic microvascular ischemic disease for age, mildly progressed relative to 2017. 3. Stable negative intracranial MRA.  US carotids 10/06/2019: Color duplex indicates minimal heterogeneous plaque, with no hemodynamically significant stenosis by duplex criteria in the extracranial cerebrovascular circulation.  Review of Systems: Patient complains of symptoms per HPI as well as the following symptoms: alteration of awareness. Pertinent negatives and positives per HPI. All others negative.   Social History   Socioeconomic History  . Marital status: Married    Spouse name: Not on file  . Number of children: 3  . Years of education: 20  . Highest education level: Not on file  Occupational History  . Occupation: Retired    Fish farm manager: RETIRED  Tobacco Use  . Smoking status: Former Smoker    Start date: 12/24/1961  .  Smokeless tobacco: Former Systems developer    Types: Chew    Quit date: 01/27/1973  . Tobacco comment: chewing   Vaping Use  . Vaping Use: Never used  Substance and Sexual Activity  . Alcohol use: No  . Drug use: No  . Sexual activity: Yes    Birth control/protection: None  Other Topics Concern  . Not on file  Social History Narrative   Lives at home w/ his wife   Right-handed   Caffeine: 2 cups/day   Social Determinants of Health   Financial Resource Strain:   . Difficulty of  Paying Living Expenses: Not on file  Food Insecurity:   . Worried About Charity fundraiser in the Last Year: Not on file  . Ran Out of Food in the Last Year: Not on file  Transportation Needs:   . Lack of Transportation (Medical): Not on file  . Lack of Transportation (Non-Medical): Not on file  Physical Activity:   . Days of Exercise per Week: Not on file  . Minutes of Exercise per Session: Not on file  Stress:   . Feeling of Stress : Not on file  Social Connections:   . Frequency of Communication with Friends and Family: Not on file  . Frequency of Social Gatherings with Friends and Family: Not on file  . Attends Religious Services: Not on file  . Active Member of Clubs or Organizations: Not on file  . Attends Archivist Meetings: Not on file  . Marital Status: Not on file  Intimate Partner Violence:   . Fear of Current or Ex-Partner: Not on file  . Emotionally Abused: Not on file  . Physically Abused: Not on file  . Sexually Abused: Not on file    Family History  Problem Relation Age of Onset  . Heart attack Father        mi I N HIS 40'S BUT LIVED INTO HIS 90'S WITH COPD  . COPD Father   . Cancer Father        colon  . Other Mother 71       died multiple med problems  . Heart disease Mother   . Hypertension Mother   . Heart attack Sister 74  . Seizures Neg Hx   . Transient ischemic attack Neg Hx   . Stroke Neg Hx     Past Medical History:  Diagnosis Date  . Chronic pain   . Complication of anesthesia    pt woke up during last surgery   . Coronary artery disease 02-27-11   Dr. Jacquiline Doe follows-has some blocked coronary arteries ,not suitable for stent  placement  . Dyslipidemia   . Dysrhythmia    hx of atrial fib during last hospitalization   . GERD (gastroesophageal reflux disease) 02-27-11   Acid reflux  . Hearing loss 02-27-11   Bilateral hearing aids-due to exposure to loud machinery  . Hemorrhoids 02-27-11   not bothersome at this  time  . Hyperlipidemia   . Hypertension   . Leg swelling   . Obesity   . Osteoarthritis 02-27-11   Ostearthritis-knees, shoulders.Back causes chronic pain-radiates down right leg  . Rash   . Seizures ([place]) 07/2014  . Shingles outbreak 02-27-11   2 weeks ago , was tx.-only residual is tenderness of right scalp-no open areas  . Shortness of breath    with exertion   . Urinary incontinence 02-27-11   not an everday occurrence-no special measures    Patient Active Problem  List   Diagnosis Date Noted  . Transient alteration of awareness 12/22/2019  . Complex partial seizure with impairment of consciousness at onset [facility]) 12/22/2019  . Encounter for chronic pain management 09/26/2019  . Complex partial seizure evolving to generalized seizure ([place]) 08/13/2015  . New onset seizure ([place]) 07/19/2015  . Seizure ([place]) 07/19/2015  . Obstructive sleep apnea 10/07/2014  . Morbid obesity ([place]) 05/23/2014  . Prediabetes 01/30/2014  . Chronic back pain 11/28/2013  . Chronic, continuous use of opioids 11/28/2013  . Postop Acute blood loss anemia 03/15/2012  . OA (osteoarthritis) of knee 03/01/2012  . Hypokalemia 03/19/2011  . Persistent atrial fibrillation ([place]) 03/17/2011  . HTN (hypertension) 03/12/2011  . Hypercholesteremia 03/12/2011  . Chronic knee pain 03/12/2011  . Postop Hyponatremia 03/12/2011  . Acute cholecystitis 03/12/2011  . Anemia 03/12/2011  . CORONARY ATHEROSCLEROSIS NATIVE CORONARY ARTERY 03/20/2010  . OTHER DYSPNEA AND RESPIRATORY ABNORMALITIES 02/08/2010  . ABNORMAL ELECTROCARDIOGRAM 02/08/2010    Past Surgical History:  Procedure Laterality Date  . CHOLECYSTECTOMY  03/14/2011   Procedure: LAPAROSCOPIC CHOLECYSTECTOMY;  Surgeon: Edward Jolly, MD;  Location: [facility];  Service: General;  Laterality: N/A;  . COLONOSCOPY    . INJECTION KNEE  03/05/2011   Procedure: KNEE INJECTION;  Surgeon: Gearlean Alf;  Location: [facility];  Service: Orthopedics;  Laterality:  Left;  80 mg depomedrol  . LAPAROSCOPY  03/16/2011   Procedure: LAPAROSCOPY DIAGNOSTIC;  Surgeon: Judieth Keens, DO;  Location: [facility];  Service: General;  Laterality: N/A;  repair incisional hernia  . REPLACEMENT TOTAL KNEE  02-27-11   right- 2007  . TOTAL KNEE ARTHROPLASTY  03/01/2012   Procedure: TOTAL KNEE ARTHROPLASTY;  Surgeon: Gearlean Alf, MD;  Location: [facility];  Service: Orthopedics;  Laterality: Left;  . TOTAL KNEE REVISION  03/05/2011   Procedure: TOTAL KNEE REVISION;  Surgeon: Dione Plover Aluisio;  Location: [facility];  Service: Orthopedics;  Laterality: Right;    Current Outpatient Medications  Medication Sig Dispense Refill  . atorvastatin (LIPITOR) 40 MG tablet Take 1 tablet (40 mg total) by mouth daily. 90 tablet 1  . cholecalciferol (VITAMIN D) 1000 UNITS tablet Take 1,000 Units by mouth daily.    . Cyanocobalamin (VITAMIN B-12 PO) Take 1,000 mg by mouth daily.    . diphenhydramine-acetaminophen (TYLENOL PM) 25-500 MG TABS tablet Take 1 tablet by mouth every evening.    . DULoxetine (CYMBALTA) 60 MG capsule Take 1 capsule (60 mg total) by mouth daily. 90 capsule 1  . ELIQUIS 5 MG TABS tablet TAKE (1) TABLET BY MOUTH TWICE DAILY. 60 tablet 10  . HYDROmorphone (DILAUDID) 2 MG tablet Take one tablet by mouth every 4 hours as needed for pain. 120 tablet 0  . lisinopril (ZESTRIL) 2.5 MG tablet Take 2 tablets (5 mg total) by mouth daily. 90 tablet 1  . Omega-3 Fatty Acids (FISH OIL) 1000 MG CAPS Take 1,000 mg by mouth daily.    . pantoprazole (PROTONIX) 40 MG tablet TAKE (1) TABLET BY MOUTH ONCE DAILY. 90 tablet 1  . Probiotic Product (ALIGN PO) Take by mouth every morning.    . tamsulosin (FLOMAX) 0.4 MG CAPS capsule TAKE ONE CAPSULE BY MOUTH ONCE DAILY. 90 capsule 1  . Lacosamide (VIMPAT) 100 MG TABS Take 1 tablet (100 mg total) by mouth in the morning and at bedtime. 60 tablet 3  . zonisamide (ZONEGRAN) 100 MG capsule Take 1 capsule (100 mg total) by mouth daily. In 2 weeks  increase to  200mg  at bedtime. 60 capsule 3   No current facility-administered medications for this visit.    Allergies as of 12/22/2019 - Review Complete 12/22/2019  Allergen Reaction Noted  . Ciprofloxacin Rash 04/04/2011  . Oxycodone-acetaminophen Rash and Other (See Comments) 02/08/2010  . Oxycodone-acetaminophen Rash 03/28/2019    Vitals: BP (!) 146/90 (BP Location: Left Arm, Patient Position: Sitting)   Pulse 70   Ht 5\' 10"  (1.778 m)   Wt 296 lb (134.3 kg)   BMI 42.47 kg/m  Last Weight:  Wt Readings from Last 1 Encounters:  12/22/19 296 lb (134.3 kg)   Last Height:   Ht Readings from Last 1 Encounters:  12/22/19 5\' 10"  (1.778 m)     Physical exam: Exam: Gen: NAD, conversant, well nourised, obese, well groomed                     CV: RRR, no MRG. No Carotid Bruits. No peripheral edema, warm, nontender Eyes: Conjunctivae clear without exudates or hemorrhage  Neuro: Detailed Neurologic Exam  Speech:    Speech is normal; fluent and spontaneous with normal comprehension.  Cognition    The patient is oriented to person, place, and time;     recent and remote memory intact;     language fluent;     impaired attention, concentration, fund of knowledge Cranial Nerves:    The pupils are equal, round, and reactive to light. Attempted fundoscopy could not visualize. Visual fields are full to finger confrontation. Extraocular movements are intact. Trigeminal sensation is intact and the muscles of mastication are normal. The face is symmetric. The palate elevates in the midline. Hearing intact. Voice is normal. Shoulder shrug is normal. The tongue has normal motion without fasciculations.   Coordination:    Normal finger to nose  Gait:    Not ataxic  Motor Observation:    No asymmetry, no atrophy, and no involuntary movements noted. Tone:    Normal muscle tone.    Posture:    Posture is normal. normal erect    Strength:    Strength is V/V in the upper and lower  limbs.      Sensation: intact to LT     Reflex Exam:  DTR's:    Deep tendon reflexes in the upper and lower extremities are symmetrical bilaterally.   Toes:    The toes are downgoing bilaterally.   Clonus:    Clonus is absent.    Assessment/Plan:  79 y.o. male here as requested by Kathyrn Drown, MD for speech disturbance. PMHx obesity, hypertension, diabetes, chronic low back pain, seizure disorder for which he has been seen in the past, he has a history of seizure of April 2017, he could not tolerate Keppra, repeat EEG was normal in the past, he stopped his Keppra and declined switching to another seizure medication.  From a review of notes he also discussed fatigue which may be due to multiple reasons including polypharmacy, obesity, lifestyle and depression.  The seizure was in 2016/2017, wife walked in and he was jerking all four limbs, drooling on the sides of the mouth, eyes closed, he became combative and "out of it", since he stopped Keppra last time I saw him he had not had a repeat event.  Seizure was unprovoked.  Patient is here with his wife having stereotyped episodes of alteration of awareness for brief periods, repeated, with a history of a generalized seizure in the past.  He seen his cardiologist without explanation on  Holter monitor, he is already on Eliquis twice a day for A. fib, would be very unlikely to be a TIA given the multiple stereotyped episodes, given his past medical history of seizure I do think this might be partial idiopathic epilepsy with altered awareness.  He did not tolerate Keppra in the past.  We will start Vimpat 100 mg twice daily and then increase to 150 mg twice daily if he tolerates it at next appointment.  In the meantime we will get a routine EEG and a 3-day EEG.  I discussed driving restrictions and other restrictions with patient and wife, he is not to drive for 6 to 12 months at least, also be careful swimming alone, doing any activities at heights,  or anything that might hurt himself or others should he have a seizure.  Routine EEG Orders Placed This Encounter  Procedures  . EEG   Meds ordered this encounter  Medications  . Lacosamide (VIMPAT) 100 MG TABS    Sig: Take 1 tablet (100 mg total) by mouth in the morning and at bedtime.    Dispense:  60 tablet    Refill:  3  . zonisamide (ZONEGRAN) 100 MG capsule    Sig: Take 1 capsule (100 mg total) by mouth daily. In 2 weeks increase to 200mg  at bedtime.    Dispense:  60 capsule    Refill:  3    Please discontinue Vimpat, this is a replacement since Vimpat was too expensive.   Addendum: Vimpat too expensive will try Zonisamide (he is obese, Depakote may exacerbate that).  Cc: Kathyrn Drown, MD,    Sarina Ill, MD  [facility] [address] [place] [employer], [place] [address]  Phone [phone] Fax [phone]  I spent more than 60 minutes of face-to-face and non-face-to-face time with patient on the  1. Transient alteration of awareness   2. Complex partial seizure with impairment of consciousness at onset [facility])    diagnosis.  This included previsit chart review, lab review, study review, order entry, electronic health record documentation, patient education on the different diagnostic and therapeutic options, counseling and coordination of care, risks and benefits of management, compliance, or risk factor reduction

## 2019-12-22 NOTE — Telephone Encounter (Signed)
Pt's wife called stating that the Lacosamide (VIMPAT) 100 MG TABS will be to expensive for him and they are wanting to know if he can be prescribed another medication in place of this one. Please advise.

## 2019-12-25 MED ORDER — ZONISAMIDE 100 MG PO CAPS: 100.0000 mg | ORAL_CAPSULE | Freq: Every day | ORAL | 3 refills | Status: DC

## 2019-12-25 NOTE — Telephone Encounter (Signed)
I called in Zonisamide and emailed them as well. I will call them Monday morning (bethany FYI)

## 2019-12-26 NOTE — Telephone Encounter (Signed)
I called and left a message, I called in a prescription for is an [facility], starting at 100 mg and going up to 200 mg.  I did inform her that I called in the prescription over the weekend and sent patient an email with all the information and details on zonisamide but if they have any side effects whatsoever to give [place] a call and also read their [place] email.  Bethany FYI.

## 2019-12-27 ENCOUNTER — Other Ambulatory Visit: Payer: Self-pay

## 2019-12-27 ENCOUNTER — Encounter: Payer: Self-pay | Admitting: Family Medicine

## 2019-12-27 ENCOUNTER — Ambulatory Visit (INDEPENDENT_AMBULATORY_CARE_PROVIDER_SITE_OTHER): Payer: PPO | Admitting: Family Medicine

## 2019-12-27 VITALS — BP 118/76 | HR 85 | Temp 97.1°F | Wt 295.6 lb

## 2019-12-27 DIAGNOSIS — Z125 Encounter for screening for malignant neoplasm of prostate: Secondary | ICD-10-CM

## 2019-12-27 DIAGNOSIS — I4819 Other persistent atrial fibrillation: Secondary | ICD-10-CM

## 2019-12-27 DIAGNOSIS — I10 Essential (primary) hypertension: Secondary | ICD-10-CM

## 2019-12-27 DIAGNOSIS — Z79891 Long term (current) use of opiate analgesic: Secondary | ICD-10-CM

## 2019-12-27 MED ORDER — HYDROMORPHONE HCL 2 MG PO TABS: 2.0000 mg | ORAL_TABLET | ORAL | 0 refills | Status: DC | PRN

## 2019-12-27 MED ORDER — HYDROMORPHONE HCL 2 MG PO TABS: ORAL_TABLET | ORAL | 0 refills | Status: DC

## 2019-12-27 NOTE — Progress Notes (Signed)
Subjective:    Patient ID: Steven [name], male    DOB: 09-Jan-1941, 79 y.o.   MRN: [id number]  HPI This patient was seen today for chronic pain Patient states pain The medication list was reviewed and updated. Patient relates pain medicine does help take 3 sometimes 4/day.  -Compliance with medication: Hydromorphone 2 mg PRN   - Number patient states they take daily: 3 - 4 per day   -when was the last dose patient took? This morning   The patient was advised the importance of maintaining medication and not using illegal substances with these.  Here for refills and follow up  The patient was educated that we can provide 3 monthly scripts for their medication, it is their responsibility to follow the instructions.  Side effects or complications from medications: none  Patient is aware that pain medications are meant to minimize the severity of the pain to allow their pain levels to improve to allow for better function. They are aware of that pain medications cannot totally remove their pain.  Due for UDT ( at least once per year) : due; pt unable to obtain urine sample at this time  Scale of 1 to 10 ( 1 is least 10 is most) Your pain level without the medicine: 7 Your pain level with medication 3  Scale 1 to 10 ( 1-helps very little, 10 helps very well) How well does your pain medication reduce your pain so you can function better through out the day? 8  Wife would like med list looked over to make sure there is nothing on list that pt should not be taking    I reviewed over all of his medications I do not feel he is on anything he does not need to be.  He will continue the current measures.  I looked over his lab work.  No additional lab work necessary currently  Review of Systems  Constitutional: Negative for activity change, appetite change and fatigue.  HENT: Negative for congestion and rhinorrhea.   Respiratory: Negative for cough and shortness of breath.     Cardiovascular: Negative for chest pain and leg swelling.  Gastrointestinal: Negative for abdominal pain, nausea and vomiting.  Neurological: Negative for dizziness and headaches.  Psychiatric/Behavioral: Negative for agitation and behavioral problems.        Objective:   Physical Exam Vitals reviewed.  Constitutional:      General: He is not in acute distress. HENT:     Head: Normocephalic and atraumatic.  Eyes:     General:        Right eye: No discharge.        Left eye: No discharge.  Neck:     Trachea: No tracheal deviation.  Cardiovascular:     Rate and Rhythm: Normal rate and regular rhythm.     Heart sounds: Normal heart sounds. No murmur heard.   Pulmonary:     Effort: Pulmonary effort is normal. No respiratory distress.     Breath sounds: Normal breath sounds.  Lymphadenopathy:     Cervical: No cervical adenopathy.  Skin:    General: Skin is warm and dry.  Neurological:     Mental Status: He is alert.     Coordination: Coordination normal.  Psychiatric:        Behavior: Behavior normal.           Assessment & Plan:  1. Essential hypertension Blood pressure good control continue current measures - Basic metabolic panel  2. Persistent atrial fibrillation (HCC) Atrial fib under good control takes the anticoagulant not having any bleeding troubles.  Heart rate controlled  3. Morbid obesity ([place]) Patient has lost 30 pounds he will continue to work hard to try and watch diet stay active and lose weight.  4. Encounter for long-term opiate analgesic use The patient was seen in followup for chronic pain. A review over at their current pain status was discussed. Drug registry was checked. Prescriptions were given.  Regular follow-up recommended. Discussion was held regarding the importance of compliance with medication as well as pain medication contract.  Patient was informed that medication may cause drowsiness and should not be combined  with other  medications/alcohol or street drugs. If the patient feels medication is causing altered alertness then do not drive or operate dangerous equipment.  He will continue his medicine he will try to work it down to just 3/day he will follow-up again in several months urine drug screen at next visit  5. Screening for prostate cancer Screening - PSA Patient is under the care of neurology for seizures he was encouraged not to do any driving

## 2019-12-28 LAB — BASIC METABOLIC PANEL
BUN/Creatinine Ratio: 23 (ref 10–24)
BUN: 22 mg/dL (ref 8–27)
CO2: 23 mmol/L (ref 20–29)
Calcium: 9.5 mg/dL (ref 8.6–10.2)
Chloride: 100 mmol/L (ref 96–106)
Creatinine, Ser: 0.96 mg/dL (ref 0.76–1.27)
GFR calc Af Amer: 87 mL/min/{1.73_m2} (ref >59)
GFR calc non Af Amer: 75 mL/min/{1.73_m2} (ref >59)
Glucose: 110 mg/dL — ABNORMAL HIGH (ref 65–99)
Potassium: 4.3 mmol/L (ref 3.5–5.2)
Sodium: 137 mmol/L (ref 134–144)

## 2019-12-28 LAB — PSA: Prostate Specific Ag, Serum: 0.1 ng/mL (ref 0.0–4.0)

## 2020-01-10 ENCOUNTER — Ambulatory Visit: Payer: PPO | Admitting: Neurology

## 2020-01-10 DIAGNOSIS — G40209 Localization-related (focal) (partial) symptomatic epilepsy and epileptic syndromes with complex partial seizures, not intractable, without status epilepticus: Secondary | ICD-10-CM | POA: Diagnosis not present

## 2020-01-10 DIAGNOSIS — R404 Transient alteration of awareness: Secondary | ICD-10-CM

## 2020-01-14 NOTE — Procedures (Signed)
   HISTORY: 79 year old male, presented with speech difficulty  TECHNIQUE:  This is a routine 16 channel EEG recording with one channel devoted to a limited EKG recording.  It was performed during wakefulness, drowsiness and asleep.  Hyperventilation and photic stimulation were performed as activating procedures.  There are minimum muscle and movement artifact noted.  Upon maximum arousal, posterior dominant waking rhythm consistent of rhythmic alpha range activity, with frequency of 8 hz. Activities are symmetric over the bilateral posterior derivations and attenuated with eye opening.  Hyperventilation produced mild/moderate buildup with higher amplitude and the slower activities noted.  Photic stimulation did not alter the tracing.  During EEG recording, patient developed drowsiness and no deeper stage of sleep was achieved  During EEG recording, there was no epileptiform discharge noted.  EKG demonstrate irregular cardiac rhythm, 68 bpm  CONCLUSION: This is a  normal awake EEG.  There is no electrodiagnostic evidence of epileptiform discharge.  There was incidental finding of irregular cardiac rhythm.  Marcial Pacas, M.D. Ph.D.  [facility] [place], [place] [address] Phone: [phone] Fax:      [phone]

## 2020-01-16 ENCOUNTER — Telehealth: Payer: Self-pay | Admitting: *Deleted

## 2020-01-16 NOTE — Telephone Encounter (Signed)
Spoke with pt's wife, Fraser Din (on [place]), and discussed EEG results and plan for 3 day EEG. Pt's wife is in agreement and understands [facility] will be calling them to discuss and schedule the EEG after we refer. Her questions were answered. She also saw the results on pt's mychart. She verbalized appreciation for the call.   72 hour EEG referral form completed, pending Dr Cathren Laine signature. Also includes office note, EEG result, insurance/demographic information and a medication list.

## 2020-01-16 NOTE — Telephone Encounter (Signed)
-----   Message from Melvenia Beam, MD sent at 01/16/2020  1:21 PM EDT ----- This EEG was normal. Please fill out a [facility] 3-day eeg request thanks

## 2020-01-17 NOTE — Telephone Encounter (Signed)
Received fax from [employer] [facility]. Referral has been received and is being processed. Our office will be notified when the patient has been successfully scheduled.

## 2020-01-19 NOTE — Telephone Encounter (Signed)
We received a fax from [employer] stating the EEG and VIDEO EEG have been approved 01/16/20-04/15/20. Authorization # [id number].   PA approval sent to medical records for scanning.

## 2020-01-23 NOTE — Telephone Encounter (Signed)
Received fax from [employer]. Pt has been scheduled for 72 hour EEG with setup date of 01/31/2020.

## 2020-01-26 ENCOUNTER — Other Ambulatory Visit: Payer: Self-pay | Admitting: Family Medicine

## 2020-01-31 DIAGNOSIS — G40209 Localization-related (focal) (partial) symptomatic epilepsy and epileptic syndromes with complex partial seizures, not intractable, without status epilepticus: Secondary | ICD-10-CM | POA: Diagnosis not present

## 2020-02-01 DIAGNOSIS — G40209 Localization-related (focal) (partial) symptomatic epilepsy and epileptic syndromes with complex partial seizures, not intractable, without status epilepticus: Secondary | ICD-10-CM | POA: Diagnosis not present

## 2020-02-02 DIAGNOSIS — G40209 Localization-related (focal) (partial) symptomatic epilepsy and epileptic syndromes with complex partial seizures, not intractable, without status epilepticus: Secondary | ICD-10-CM | POA: Diagnosis not present

## 2020-02-03 ENCOUNTER — Other Ambulatory Visit: Payer: Self-pay | Admitting: Family Medicine

## 2020-02-09 NOTE — Telephone Encounter (Signed)
Received fax from [facility] with Amb EEG w/video results. Placed on Dr Cathren Laine desk for review.

## 2020-02-23 NOTE — Telephone Encounter (Signed)
Wife(on [place]) states she and pt are waiting on Covid-19 test results.  She states e: the appointment on Monday of next week she doesn't really want to do a my chart vv due to spotty internet.  Pt's wife Is asking if on Monday they can just get a call with the results to the EEG.  Please call, wife again stated pt just wants the results.  Please call

## 2020-02-23 NOTE — Telephone Encounter (Signed)
EEG was normal. As long as he is doing well, Just reschedule his appointment for the future with an NP 3-6 months. thanks

## 2020-02-23 NOTE — Telephone Encounter (Signed)
I called Steven [name] (wife, on [place]) and let her know we can do a telephone visit next Monday 11/15 @ 9:30 AM per Dr Jaynee Eagles. Pt's wife stated they had a COVID exposure and are getting tested tomorrow. The pt's wife provided consent to file visit with insurance. She is aware Dr Jaynee Eagles will call pt after she finishes with her 9 AM pt. Pat verbalized appreciation for the call.

## 2020-02-23 NOTE — Telephone Encounter (Signed)
Pt also had 72 hour EEG. Dr Jaynee Eagles will discuss results with them on Monday 11/15 and per Dr Jaynee Eagles the appt can be a telephone visit.

## 2020-02-27 ENCOUNTER — Ambulatory Visit (INDEPENDENT_AMBULATORY_CARE_PROVIDER_SITE_OTHER): Payer: PPO | Admitting: Neurology

## 2020-02-27 DIAGNOSIS — G40109 Localization-related (focal) (partial) symptomatic epilepsy and epileptic syndromes with simple partial seizures, not intractable, without status epilepticus: Secondary | ICD-10-CM | POA: Insufficient documentation

## 2020-02-27 MED ORDER — ZONISAMIDE 100 MG PO CAPS
100.0000 mg | ORAL_CAPSULE | Freq: Every day | ORAL | 3 refills | Status: DC
Start: 2020-02-27 — End: 2020-02-27

## 2020-02-27 MED ORDER — ZONISAMIDE 100 MG PO CAPS
200.0000 mg | ORAL_CAPSULE | Freq: Every day | ORAL | 3 refills | Status: DC
Start: 2020-02-27 — End: 2021-01-23

## 2020-02-27 NOTE — Progress Notes (Signed)
[facility]    Provider:  Dr Jaynee Eagles Requesting Provider: Kathyrn Drown, MD Primary Care Provider:  Kathyrn Drown, MD  CC:  epilepsy  Virtual Visit via Telephone Note  I connected with Steven [name] on 02/27/20 at  9:30 AM EST by telephone and verified that I am speaking with the correct person using two identifiers.  Location: Patient: home  Provider: office   I discussed the limitations, risks, security and privacy concerns of performing an evaluation and management service by telephone and the availability of in person appointments. I also discussed with the patient that there may be a patient responsible charge related to this service. The patient expressed understanding and agreed to proceed.   Follow Up Instructions:    I discussed the assessment and treatment plan with the patient. The patient was provided an opportunity to ask questions and all were answered. The patient agreed with the plan and demonstrated an understanding of the instructions.   The patient was advised to call back or seek an in-person evaluation if the symptoms worsen or if the condition fails to improve as anticipated.  I provided over 22 minutes of non-face-to-face time during this encounter.   Melvenia Beam, MD  Interval history 02/27/2020: EEG 72-hour ambulatory January 31, 2020 to February 03, 2020 was abnormal: There were intermittent left frontal temporal sharp transients seen and noted, findings consistent with left frontal/anterior temporal sharp wave activity, potentially epileptiform in nature which places this patient at an increased risk for focal epilepsy.  I discussed with the patient and wife today, suggested antiepilepsy medications for life.  If he has any more episodes we can increase his zonisamide to 300 mg a day.  They understood.  We also discussed driving, he has not had a seizure for 6 months I would prefer if he waited till after the holidays he can drive  in January.    HPI:  Steven [name] is a 79 y.o. male here as requested by Kathyrn Drown, MD for speech disturbance. PMHx obesity, hypertension, diabetes, chronic low back pain, seizure disorder for which he has been seen in the past, he has a history of seizure of April 2017, he could not tolerate Keppra, repeat EEG was normal in the past, he stopped his Keppra and declined switching to another seizure medication.  From a review of notes he also discussed fatigue which may be due to multiple reasons including polypharmacy, obesity, lifestyle and depression.  The seizure was in 2016/2017, wife walked in and he was jerking all four limbs, drooling on the sides of the mouth, eyes closed, he became combative and "out of it", since he stopped Keppra last time I saw him he had not had a repeat event.  Seizure was unprovoked.  I reviewed Dr. Lance Sell notes concerning possible TIA.  Patient had a spell where he could not speak lasted less than a few seconds, the spells occur he will seem out of it but awake and unable to respond to her and will take several moments before is able to make any sense, patient denied any unilateral numbness weakness, any headaches, he has been worked up in the past with a normal EEG and is also had an MRI MRA  4 years ago due to TIAs.  Dr. Wolfgang Phoenix  e ordered MRI of the brain with and without contrast, MR angio of the head without contrast and ultrasound carotid bilaterally.  Wife gives most information, back in November he had "  spells" he was not there, confused, wasn;t understanding, just a few seconds and then he would be ok. They would happen during the day, he had  Few of them unknown how many, he saw his cardiologist in December and told him about it and he was on a heart monitor for a while. He is compliant with his Eliquis. In March he had several more, he doesn't pass out, he doesn't lose consciousness, one time he wa at the kitchen table and she asked him multiple questions  and she kept asking him the same question and when he answered it didn't make sense. The most recent was in August, he did not remember it, lasted a minute or so, afterwards it is just normal. He has sleep apnea and compliant.   Reviewed notes, labs and imaging from outside physicians, which showed:  MRI brain/MRA head 10/15/2019: personally reviewed and agree with the following  IMPRESSION: 1. No acute intracranial abnormality. 2. Mild chronic microvascular ischemic disease for age, mildly progressed relative to 2017. 3. Stable negative intracranial MRA.  US carotids 10/06/2019: Color duplex indicates minimal heterogeneous plaque, with no hemodynamically significant stenosis by duplex criteria in the extracranial cerebrovascular circulation.  Review of Systems: Patient complains of symptoms per HPI as well as the following symptoms: alteration of awareness. Pertinent negatives and positives per HPI. All others negative.   Social History   Socioeconomic History  . Marital status: Married    Spouse name: Not on file  . Number of children: 3  . Years of education: 59  . Highest education level: Not on file  Occupational History  . Occupation: Retired    Fish farm manager: RETIRED  Tobacco Use  . Smoking status: Former Smoker    Start date: 12/24/1961  . Smokeless tobacco: Former Systems developer    Types: Chew    Quit date: 01/27/1973  . Tobacco comment: chewing   Vaping Use  . Vaping Use: Never used  Substance and Sexual Activity  . Alcohol use: No  . Drug use: No  . Sexual activity: Yes    Birth control/protection: None  Other Topics Concern  . Not on file  Social History Narrative   Lives at home w/ his wife   Right-handed   Caffeine: 2 cups/day   Social Determinants of Health   Financial Resource Strain:   . Difficulty of Paying Living Expenses: Not on file  Food Insecurity:   . Worried About Charity fundraiser in the Last Year: Not on file  . Ran Out of Food in the Last Year:  Not on file  Transportation Needs:   . Lack of Transportation (Medical): Not on file  . Lack of Transportation (Non-Medical): Not on file  Physical Activity:   . Days of Exercise per Week: Not on file  . Minutes of Exercise per Session: Not on file  Stress:   . Feeling of Stress : Not on file  Social Connections:   . Frequency of Communication with Friends and Family: Not on file  . Frequency of Social Gatherings with Friends and Family: Not on file  . Attends Religious Services: Not on file  . Active Member of Clubs or Organizations: Not on file  . Attends Archivist Meetings: Not on file  . Marital Status: Not on file  Intimate Partner Violence:   . Fear of Current or Ex-Partner: Not on file  . Emotionally Abused: Not on file  . Physically Abused: Not on file  . Sexually Abused: Not on  file    Family History  Problem Relation Age of Onset  . Heart attack Father        mi I N HIS 40'S BUT LIVED INTO HIS 90'S WITH COPD  . COPD Father   . Cancer Father        colon  . Other Mother 58       died multiple med problems  . Heart disease Mother   . Hypertension Mother   . Heart attack Sister 34  . Seizures Neg Hx   . Transient ischemic attack Neg Hx   . Stroke Neg Hx     Past Medical History:  Diagnosis Date  . Chronic pain   . Complication of anesthesia    pt woke up during last surgery   . Coronary artery disease 02-27-11   Dr. Jacquiline Doe follows-has some blocked coronary arteries ,not suitable for stent  placement  . Dyslipidemia   . Dysrhythmia    hx of atrial fib during last hospitalization   . GERD (gastroesophageal reflux disease) 02-27-11   Acid reflux  . Hearing loss 02-27-11   Bilateral hearing aids-due to exposure to loud machinery  . Hemorrhoids 02-27-11   not bothersome at this time  . Hyperlipidemia   . Hypertension   . Leg swelling   . Obesity   . Osteoarthritis 02-27-11   Ostearthritis-knees, shoulders.Back causes chronic  pain-radiates down right leg  . Rash   . Seizures ([place]) 07/2014  . Shingles outbreak 02-27-11   2 weeks ago , was tx.-only residual is tenderness of right scalp-no open areas  . Shortness of breath    with exertion   . Urinary incontinence 02-27-11   not an everday occurrence-no special measures    Patient Active Problem List   Diagnosis Date Noted  . Transient alteration of awareness 12/22/2019  . Complex partial seizure with impairment of consciousness at onset [facility]) 12/22/2019  . Encounter for chronic pain management 09/26/2019  . Complex partial seizure evolving to generalized seizure ([place]) 08/13/2015  . New onset seizure ([place]) 07/19/2015  . Seizure ([place]) 07/19/2015  . Obstructive sleep apnea 10/07/2014  . Morbid obesity ([place]) 05/23/2014  . Prediabetes 01/30/2014  . Chronic back pain 11/28/2013  . Chronic, continuous use of opioids 11/28/2013  . Postop Acute blood loss anemia 03/15/2012  . OA (osteoarthritis) of knee 03/01/2012  . Hypokalemia 03/19/2011  . Persistent atrial fibrillation ([place]) 03/17/2011  . HTN (hypertension) 03/12/2011  . Hypercholesteremia 03/12/2011  . Chronic knee pain 03/12/2011  . Postop Hyponatremia 03/12/2011  . Acute cholecystitis 03/12/2011  . Anemia 03/12/2011  . CORONARY ATHEROSCLEROSIS NATIVE CORONARY ARTERY 03/20/2010  . OTHER DYSPNEA AND RESPIRATORY ABNORMALITIES 02/08/2010  . ABNORMAL ELECTROCARDIOGRAM 02/08/2010    Past Surgical History:  Procedure Laterality Date  . CHOLECYSTECTOMY  03/14/2011   Procedure: LAPAROSCOPIC CHOLECYSTECTOMY;  Surgeon: Edward Jolly, MD;  Location: [facility];  Service: General;  Laterality: N/A;  . COLONOSCOPY    . INJECTION KNEE  03/05/2011   Procedure: KNEE INJECTION;  Surgeon: Gearlean Alf;  Location: [facility];  Service: Orthopedics;  Laterality: Left;  80 mg depomedrol  . LAPAROSCOPY  03/16/2011   Procedure: LAPAROSCOPY DIAGNOSTIC;  Surgeon: Judieth Keens, DO;  Location: [facility];  Service:  General;  Laterality: N/A;  repair incisional hernia  . REPLACEMENT TOTAL KNEE  02-27-11   right- 2007  . TOTAL KNEE ARTHROPLASTY  03/01/2012   Procedure: TOTAL KNEE ARTHROPLASTY;  Surgeon: Gearlean Alf, MD;  Location: [facility];  Service: Orthopedics;  Laterality: Left;  . TOTAL KNEE REVISION  03/05/2011   Procedure: TOTAL KNEE REVISION;  Surgeon: Dione Plover Aluisio;  Location: [facility];  Service: Orthopedics;  Laterality: Right;    Current Outpatient Medications  Medication Sig Dispense Refill  . atorvastatin (LIPITOR) 40 MG tablet TAKE ONE TABLET BY MOUTH ONCE DAILY. 90 tablet 0  . cholecalciferol (VITAMIN D) 1000 UNITS tablet Take 1,000 Units by mouth daily.    . Cyanocobalamin (VITAMIN B-12 PO) Take 1,000 mg by mouth daily.    . diphenhydramine-acetaminophen (TYLENOL PM) 25-500 MG TABS tablet Take 1 tablet by mouth every evening.    . DULoxetine (CYMBALTA) 60 MG capsule Take 1 capsule (60 mg total) by mouth daily. 90 capsule 1  . ELIQUIS 5 MG TABS tablet TAKE (1) TABLET BY MOUTH TWICE DAILY. 60 tablet 10  . HYDROmorphone (DILAUDID) 2 MG tablet Take one tablet by mouth every 4 hours as needed for pain. 120 tablet 0  . HYDROmorphone (DILAUDID) 2 MG tablet Take 1 tablet (2 mg total) by mouth every 4 (four) hours as needed for severe pain. 120 tablet 0  . HYDROmorphone (DILAUDID) 2 MG tablet Take 1 tablet (2 mg total) by mouth every 4 (four) hours as needed for severe pain. Max dose 4 per day 120 tablet 0  . lisinopril (ZESTRIL) 2.5 MG tablet Take 2 tablets (5 mg total) by mouth daily. 90 tablet 1  . Omega-3 Fatty Acids (FISH OIL) 1000 MG CAPS Take 1,000 mg by mouth daily.    . pantoprazole (PROTONIX) 40 MG tablet TAKE (1) TABLET BY MOUTH ONCE DAILY. 90 tablet 1  . Probiotic Product (ALIGN PO) Take by mouth every morning.    . tamsulosin (FLOMAX) 0.4 MG CAPS capsule TAKE ONE CAPSULE BY MOUTH ONCE DAILY. 90 capsule 1  . zonisamide (ZONEGRAN) 100 MG capsule Take 2 capsules (200 mg total) by mouth  daily. 180 capsule 3   No current facility-administered medications for this visit.    Allergies as of 02/27/2020 - Review Complete 12/27/2019  Allergen Reaction Noted  . Ciprofloxacin Rash 04/04/2011  . Oxycodone-acetaminophen Rash and Other (See Comments) 02/08/2010  . Oxycodone-acetaminophen Rash 03/28/2019    Vitals: There were no vitals taken for this visit. Last Weight:  Wt Readings from Last 1 Encounters:  12/27/19 295 lb 9.3 oz (134.1 kg)   Last Height:   Ht Readings from Last 1 Encounters:  12/22/19 5\' 10"  (1.778 m)    PRIOR exam (on the phone today)  Physical exam: Exam: Gen: NAD, conversant, well nourised, obese, well groomed                     CV: RRR, no MRG. No Carotid Bruits. No peripheral edema, warm, nontender Eyes: Conjunctivae clear without exudates or hemorrhage  Neuro: Detailed Neurologic Exam  Speech:    Speech is normal; fluent and spontaneous with normal comprehension.  Cognition    The patient is oriented to person, place, and time;     recent and remote memory intact;     language fluent;     impaired attention, concentration, fund of knowledge Cranial Nerves:    The pupils are equal, round, and reactive to light. Attempted fundoscopy could not visualize. Visual fields are full to finger confrontation. Extraocular movements are intact. Trigeminal sensation is intact and the muscles of mastication are normal. The face is symmetric. The palate elevates in the midline. Hearing intact. Voice is normal. Shoulder shrug is  normal. The tongue has normal motion without fasciculations.   Coordination:    Normal finger to nose  Gait:    Not ataxic  Motor Observation:    No asymmetry, no atrophy, and no involuntary movements noted. Tone:    Normal muscle tone.    Posture:    Posture is normal. normal erect    Strength:    Strength is V/V in the upper and lower limbs.      Sensation: intact to LT     Reflex Exam:  DTR's:    Deep tendon  reflexes in the upper and lower extremities are symmetrical bilaterally.   Toes:    The toes are downgoing bilaterally.   Clonus:    Clonus is absent.    Assessment/Plan:  79 y.o. male here as requested by Kathyrn Drown, MD for speech disturbance. PMHx obesity, hypertension, diabetes, chronic low back pain, seizure disorder for which he has been seen in the past, he has a history of seizure of April 2017, he could not tolerate Keppra, repeat EEG was normal in the past, he stopped his Keppra and declined switching to another seizure medication.  From a review of notes he also discussed fatigue which may be due to multiple reasons including polypharmacy, obesity, lifestyle and depression.  The seizure was in 2016/2017, wife walked in and he was jerking all four limbs, drooling on the sides of the mouth, eyes closed, he became combative and "out of it", since he stopped Keppra last time I saw him he had not had a repeat event.  Seizure was unprovoked.  Vimpat was too expensive. He is on Zonisamide. 200mg  and doing well.  Patient is here with his wife having stereotyped episodes of alteration of awareness for brief periods, repeated, with a history of a generalized seizure in the past.  He seen his cardiologist without explanation on Holter monitor, he is already on Eliquis twice a day for A. fib, would be very unlikely to be a TIA given the multiple stereotyped episodes, given his past medical history of seizure I do think this might be partial idiopathic epilepsy with altered awareness.    He did not tolerate Keppra. Vimpat too expensive. Doing well on Zonisamide Continue  EEG 72-hour ambulatory January 31, 2020 to February 03, 2020 was abnormal: There were intermittent left frontal temporal sharp transients seen and noted, findings consistent with left frontal/anterior temporal sharp wave activity, potentially epileptiform in nature which places this patient at an increased risk for focal epilepsy.  I  discussed with the patient and wife today, suggested antiepilepsy medications for life.  If he has any more episodes we can increase his zonisamide to 300 mg a day.  They understood.  We also discussed driving, he has not had a seizure for 6 months I would prefer if he waited till after the holidays he can drive in January.    Meds ordered this encounter  Medications  . DISCONTD: zonisamide (ZONEGRAN) 100 MG capsule    Sig: Take 1 capsule (100 mg total) by mouth daily. In 2 weeks increase to 200mg  at bedtime.    Dispense:  60 capsule    Refill:  3    Please discontinue Vimpat, this is a replacement since Vimpat was too expensive.  . zonisamide (ZONEGRAN) 100 MG capsule    Sig: Take 2 capsules (200 mg total) by mouth daily.    Dispense:  180 capsule    Refill:  3    Cc: Sallee Lange  A, MD,    Sarina Ill, MD  [facility] [address] [place],  [address]  Phone [phone] Fax [phone]

## 2020-03-14 ENCOUNTER — Other Ambulatory Visit: Payer: Self-pay | Admitting: Cardiovascular Disease

## 2020-03-14 NOTE — Telephone Encounter (Signed)
Prescription refill request for Eliquis received. Indication: Afib Last office visit: Steven [name], 09/30/2019 Scr: 0.96, 12/27/2019 Age: 79 yo  Weight: 134 kg   Prescription refill sent.

## 2020-03-27 ENCOUNTER — Telehealth: Payer: Self-pay | Admitting: *Deleted

## 2020-03-27 ENCOUNTER — Ambulatory Visit (INDEPENDENT_AMBULATORY_CARE_PROVIDER_SITE_OTHER): Payer: PPO | Admitting: Family Medicine

## 2020-03-27 VITALS — BP 116/74 | HR 70 | Temp 97.0°F | Ht 70.0 in | Wt 307.0 lb

## 2020-03-27 DIAGNOSIS — R350 Frequency of micturition: Secondary | ICD-10-CM

## 2020-03-27 DIAGNOSIS — I10 Essential (primary) hypertension: Secondary | ICD-10-CM

## 2020-03-27 DIAGNOSIS — Z79891 Long term (current) use of opiate analgesic: Secondary | ICD-10-CM | POA: Diagnosis not present

## 2020-03-27 DIAGNOSIS — J3489 Other specified disorders of nose and nasal sinuses: Secondary | ICD-10-CM

## 2020-03-27 LAB — POCT URINALYSIS DIPSTICK
Spec Grav, UA: <=1.005 — AB (ref 1.010–1.025)
pH, UA: 6 (ref 5.0–8.0)

## 2020-03-27 MED ORDER — HYDROMORPHONE HCL 2 MG PO TABS: ORAL_TABLET | ORAL | 0 refills | Status: DC

## 2020-03-27 MED ORDER — HYDROMORPHONE HCL 2 MG PO TABS
ORAL_TABLET | ORAL | 0 refills | Status: DC
Start: 2020-03-27 — End: 2020-06-25

## 2020-03-27 MED ORDER — CEPHALEXIN 500 MG PO CAPS: 500.0000 mg | ORAL_CAPSULE | Freq: Three times a day (TID) | ORAL | 0 refills | Status: DC

## 2020-03-27 NOTE — Patient Instructions (Signed)
Continue to take your Flomax to help you with the urinary flow  It is important to collect a urine so that we can check it for infection  I would recommend Keflex 500 mg 1 tablet 3 times daily for 7 days-you may start this medicine after you have given [place] a urine sample  As for the pain medicine I recommend sticking with 2/day  If you do decide to cut down I would recommend the next step would be 1/2 tablet 3 times daily  Please follow-up in 3 months  Sooner if any ongoing troubles  Avoid Tylenol PM

## 2020-03-27 NOTE — Telephone Encounter (Signed)
Pt got urine sample after dr Nicki Reaper was done with visit today. Urine was sent to lab for culture and it is ready to look at   Results for orders placed or performed in visit on 03/27/20  POCT urinalysis dipstick  Result Value Ref Range   Color, UA     Clarity, UA     Glucose, UA     Bilirubin, UA     Ketones, UA     Spec Grav, UA <=1.005 (A) 1.010 - 1.025   Blood, UA     pH, UA 6.0 5.0 - 8.0   Protein, UA     Urobilinogen, UA     Nitrite, UA     Leukocytes, UA     Appearance     Odor

## 2020-03-27 NOTE — Progress Notes (Signed)
Subjective:    Patient ID: Steven [name], male    DOB: 06/09/1940, 79 y.o.   MRN: [id number]  HPI This patient was seen today for chronic pain  The medication list was reviewed and updated.   -Compliance with medication: takes 2 or 3 a day  - Number patient states they take daily: 2 to 3 a day  -when was the last dose patient took? today  The patient was advised the importance of maintaining medication and not using illegal substances with these.  Here for refills and follow up  The patient was educated that we can provide 3 monthly scripts for their medication, it is their responsibility to follow the instructions.  Side effects or complications from medications: none  Patient is aware that pain medications are meant to minimize the severity of the pain to allow their pain levels to improve to allow for better function. They are aware of that pain medications cannot totally remove their pain.  Due for UDT ( at least once per year) : due today-unable to give urine for U DT will do this on next visit  Scale of 1 to 10 ( 1 is least 10 is most) Your pain level without the medicine: pt not sure Your pain level with medication not sure. States meds dont help  Scale 1 to 10 ( 1-helps very little, 10 helps very well) How well does your pain medication reduce your pain so you can function better through out the day? Pt states pai meds do not help   Frequent urination for about 3 days.   States nurse with insurance called him and told him he should not be taking tylenol pm when she went over his meds with him.-We discussed this in detail he will stop taking this medication  Sometimes vision is blurry. Declined to do vision test at office. States he sees his eye dr once a year.      Fall Risk  03/27/2020 09/26/2019 06/27/2019 01/12/2019 11/11/2018  Falls in the past year? 0 0 0 0 (No Data)  Comment - - - - Emmi Telephone Survey: data to providers prior to load  Number falls in past  yr: 0 - 0 - (No Data)  Comment - - - - Emmi Telephone Survey Actual Response =   Injury with Fall? - - 0 - -  Follow up Falls evaluation completed Falls evaluation completed Falls evaluation completed - -      Review of Systems  Constitutional: Negative for diaphoresis and fatigue.  HENT: Negative for congestion and rhinorrhea.   Respiratory: Negative for cough and shortness of breath.   Cardiovascular: Negative for chest pain and leg swelling.  Gastrointestinal: Negative for abdominal pain and diarrhea.  Genitourinary: Positive for frequency. Negative for dysuria.  Skin: Negative for color change and rash.  Neurological: Negative for dizziness and headaches.  Psychiatric/Behavioral: Negative for behavioral problems and confusion.       Objective:   Physical Exam Vitals reviewed.  Constitutional:      General: He is not in acute distress.    Appearance: He is well-nourished.  HENT:     Head: Normocephalic and atraumatic.  Eyes:     General:        Right eye: No discharge.        Left eye: No discharge.  Neck:     Trachea: No tracheal deviation.  Cardiovascular:     Rate and Rhythm: Normal rate and regular rhythm.  Heart sounds: Normal heart sounds. No murmur heard.   Pulmonary:     Effort: Pulmonary effort is normal. No respiratory distress.     Breath sounds: Normal breath sounds.  Musculoskeletal:        General: No edema.  Lymphadenopathy:     Cervical: No cervical adenopathy.  Skin:    General: Skin is warm and dry.  Neurological:     Mental Status: He is alert.     Coordination: Coordination normal.  Psychiatric:        Mood and Affect: Mood and affect normal.        Behavior: Behavior normal.   Has a spot on his nose on recommend him to see dermatology        Assessment & Plan:  1. Frequent urination Antibiotic prescribed urine culture taken could be prostate related may need to see urology if culture negative.  Patient currently using Flomax  every night states his urine flow is good - Urine Culture - POCT urinalysis dipstick  2. Lesion of nose Lesion on the nose could be a early skin cancer recommend dermatology - Ambulatory referral to Dermatology  3. Primary hypertension Blood pressure good control continue current measures  4. Morbid obesity ([place]) Portion control healthy eating recommended  The patient was seen in followup for chronic pain. A review over at their current pain status was discussed. Drug registry was checked. Prescriptions were given.  Regular follow-up recommended. Discussion was held regarding the importance of compliance with medication as well as pain medication contract.  Patient was informed that medication may cause drowsiness and should not be combined  with other medications/alcohol or street drugs. If the patient feels medication is causing altered alertness then do not drive or operate dangerous equipment.  Drug registry checked patient is tapering down on his pain medicine therefore only 2/day over time possibly less-caution drowsiness

## 2020-03-29 NOTE — Telephone Encounter (Signed)
FYI patient was placed on antibiotic urine culture was sent await the results

## 2020-04-02 LAB — SPECIMEN STATUS REPORT

## 2020-04-02 LAB — URINE CULTURE

## 2020-04-02 MED ORDER — SULFAMETHOXAZOLE-TRIMETHOPRIM 800-160 MG PO TABS: 1.0000 | ORAL_TABLET | Freq: Two times a day (BID) | ORAL | 0 refills | Status: DC

## 2020-04-02 NOTE — Addendum Note (Signed)
Addended by: Dairl Ponder on: 04/02/2020 02:21 PM   Modules accepted: Orders

## 2020-04-24 ENCOUNTER — Other Ambulatory Visit: Payer: Self-pay | Admitting: Family Medicine

## 2020-05-03 DIAGNOSIS — I781 Nevus, non-neoplastic: Secondary | ICD-10-CM | POA: Diagnosis not present

## 2020-05-03 DIAGNOSIS — L720 Epidermal cyst: Secondary | ICD-10-CM | POA: Diagnosis not present

## 2020-05-16 ENCOUNTER — Other Ambulatory Visit: Payer: Self-pay | Admitting: Family Medicine

## 2020-05-16 DIAGNOSIS — R6889 Other general symptoms and signs: Secondary | ICD-10-CM | POA: Diagnosis not present

## 2020-05-29 ENCOUNTER — Other Ambulatory Visit: Payer: Self-pay | Admitting: Family Medicine

## 2020-06-25 ENCOUNTER — Ambulatory Visit (INDEPENDENT_AMBULATORY_CARE_PROVIDER_SITE_OTHER): Payer: PPO | Admitting: Family Medicine

## 2020-06-25 ENCOUNTER — Other Ambulatory Visit: Payer: Self-pay

## 2020-06-25 ENCOUNTER — Encounter: Payer: Self-pay | Admitting: Family Medicine

## 2020-06-25 VITALS — BP 137/82 | HR 100 | Temp 96.8°F | Wt 323.4 lb

## 2020-06-25 DIAGNOSIS — I4819 Other persistent atrial fibrillation: Secondary | ICD-10-CM | POA: Diagnosis not present

## 2020-06-25 DIAGNOSIS — E7800 Pure hypercholesterolemia, unspecified: Secondary | ICD-10-CM | POA: Diagnosis not present

## 2020-06-25 DIAGNOSIS — Z79899 Other long term (current) drug therapy: Secondary | ICD-10-CM | POA: Diagnosis not present

## 2020-06-25 DIAGNOSIS — G40109 Localization-related (focal) (partial) symptomatic epilepsy and epileptic syndromes with simple partial seizures, not intractable, without status epilepticus: Secondary | ICD-10-CM

## 2020-06-25 DIAGNOSIS — Z1159 Encounter for screening for other viral diseases: Secondary | ICD-10-CM | POA: Diagnosis not present

## 2020-06-25 DIAGNOSIS — I10 Essential (primary) hypertension: Secondary | ICD-10-CM

## 2020-06-25 MED ORDER — ATORVASTATIN CALCIUM 40 MG PO TABS: 40.0000 mg | ORAL_TABLET | Freq: Every day | ORAL | 1 refills | Status: DC

## 2020-06-25 MED ORDER — HYDROMORPHONE HCL 2 MG PO TABS: ORAL_TABLET | ORAL | 0 refills | Status: DC

## 2020-06-25 MED ORDER — LISINOPRIL 2.5 MG PO TABS: 2.5000 mg | ORAL_TABLET | Freq: Every day | ORAL | 1 refills | Status: DC

## 2020-06-25 MED ORDER — TAMSULOSIN HCL 0.4 MG PO CAPS: 0.4000 mg | ORAL_CAPSULE | Freq: Every day | ORAL | 1 refills | Status: DC

## 2020-06-25 MED ORDER — PANTOPRAZOLE SODIUM 40 MG PO TBEC: DELAYED_RELEASE_TABLET | ORAL | 1 refills | Status: DC

## 2020-06-25 NOTE — Progress Notes (Signed)
Subjective:    Patient ID: Steven [name], male    DOB: 07-Dec-1940, 80 y.o.   MRN: [id number]  HPI This patient was seen today for chronic pain  The medication list was reviewed and updated.   -Compliance with medication: Hydromorphone 2 mg  - Number patient states they take daily: 2  -when was the last dose patient took? This morning   The patient was advised the importance of maintaining medication and not using illegal substances with these.  Here for refills and follow up  The patient was educated that we can provide 3 monthly scripts for their medication, it is their responsibility to follow the instructions.  Side effects or complications from medications: no  Patient is aware that pain medications are meant to minimize the severity of the pain to allow their pain levels to improve to allow for better function. They are aware of that pain medications cannot totally remove their pain.  Due for UDT ( at least once per year) : due today-pt unable to void at this time  Scale of 1 to 10 ( 1 is least 10 is most) Your pain level without the medicine: 7 Your pain level with medication: 2  Scale 1 to 10 ( 1-helps very little, 10 helps very well) How well does your pain medication reduce your pain so you can function better through out the day?7  Patient has significant back pain as well as bilateral knee pain despite having knee replacements.  The Dilaudid does help him.  He is tapering it all the way down to 2 mg twice a day.  We did discuss how he could go even lower to 1/2 tablet 3 times a day currently he is not interested in trying to go lower  Essential hypertension - Plan: Hepatitis C Antibody, Lipid Profile, Basic Metabolic Panel (BMET)  Hypercholesteremia - Plan: Hepatitis C Antibody, Lipid Profile, Basic Metabolic Panel (BMET)  High risk medication use - Plan: Hepatitis C Antibody, Lipid Profile, Basic Metabolic Panel (BMET)  Encounter for hepatitis C screening test  for low risk patient - Plan: Hepatitis C Antibody, Lipid Profile, Basic Metabolic Panel (BMET)       Review of Systems  Constitutional: Negative for activity change, fatigue and fever.  HENT: Negative for congestion and rhinorrhea.   Respiratory: Negative for cough and shortness of breath.   Cardiovascular: Negative for chest pain and leg swelling.  Gastrointestinal: Negative for abdominal pain, diarrhea and nausea.  Genitourinary: Negative for dysuria and hematuria.  Neurological: Negative for weakness and headaches.  Psychiatric/Behavioral: Negative for agitation and behavioral problems.       Objective:   Physical Exam Vitals reviewed.  Cardiovascular:     Rate and Rhythm: Normal rate and regular rhythm.     Heart sounds: Normal heart sounds. No murmur heard.   Pulmonary:     Effort: Pulmonary effort is normal.     Breath sounds: Normal breath sounds.  Lymphadenopathy:     Cervical: No cervical adenopathy.  Neurological:     Mental Status: He is alert.  Psychiatric:        Behavior: Behavior normal.           Assessment & Plan:  Does have mild BPH takes Flomax does well with this Blood pressure good control overall continue current measures watch diet check metabolic 7 Chronic knee and back pain Dilaudid 2 tablets daily We did discuss tapering to 1-1/2/day patient currently does not want to do that The patient  was seen in followup for chronic pain. A review over at their current pain status was discussed. Drug registry was checked. Prescriptions were given.  Regular follow-up recommended. Discussion was held regarding the importance of compliance with medication as well as pain medication contract.  Patient was informed that medication may cause drowsiness and should not be combined  with other medications/alcohol or street drugs. If the patient feels medication is causing altered alertness then do not drive or operate dangerous equipment.  Drug registry was  checked  Hyperlipidemia check lipid profile await results continue medication watch diet  1. Essential hypertension Continue current dosing check metabolic 7 - Hepatitis C Antibody - Lipid Profile - Basic Metabolic Panel (BMET)  2. Hypercholesteremia Continue Lipitor watch diet check cholesterol profile - Hepatitis C Antibody - Lipid Profile - Basic Metabolic Panel (BMET)  3. High risk medication use Hep C antibody - Hepatitis C Antibody - Lipid Profile - Basic Metabolic Panel (BMET)  4. Encounter for hepatitis C screening test for low risk patient Hep C antibody - Hepatitis C Antibody - Lipid Profile - Basic Metabolic Panel (BMET)  5. Persistent atrial fibrillation (HCC) Atrial fib under good control tolerating Eliquis no bleeding issues  6. Morbid obesity ([place]) Watch portions try to stay more active be careful with eating habits  7. Focal epilepsy ([place]) No seizures recently taking medications as directed  Follow-up 3 months

## 2020-06-26 DIAGNOSIS — I10 Essential (primary) hypertension: Secondary | ICD-10-CM | POA: Diagnosis not present

## 2020-06-26 DIAGNOSIS — Z1159 Encounter for screening for other viral diseases: Secondary | ICD-10-CM | POA: Diagnosis not present

## 2020-06-26 DIAGNOSIS — E7800 Pure hypercholesterolemia, unspecified: Secondary | ICD-10-CM | POA: Diagnosis not present

## 2020-06-26 DIAGNOSIS — Z79899 Other long term (current) drug therapy: Secondary | ICD-10-CM | POA: Diagnosis not present

## 2020-06-27 LAB — BASIC METABOLIC PANEL
BUN/Creatinine Ratio: 21 (ref 10–24)
BUN: 22 mg/dL (ref 8–27)
CO2: 23 mmol/L (ref 20–29)
Calcium: 9.1 mg/dL (ref 8.6–10.2)
Chloride: 105 mmol/L (ref 96–106)
Creatinine, Ser: 1.05 mg/dL (ref 0.76–1.27)
Glucose: 118 mg/dL — ABNORMAL HIGH (ref 65–99)
Potassium: 4.6 mmol/L (ref 3.5–5.2)
Sodium: 143 mmol/L (ref 134–144)
eGFR: 73 mL/min/{1.73_m2} (ref >59)

## 2020-06-27 LAB — LIPID PANEL
Chol/HDL Ratio: 2.9 ratio (ref 0.0–5.0)
Cholesterol, Total: 120 mg/dL (ref 100–199)
HDL: 41 mg/dL (ref >39)
LDL Chol Calc (NIH): 64 mg/dL (ref 0–99)
Triglycerides: 73 mg/dL (ref 0–149)
VLDL Cholesterol Cal: 15 mg/dL (ref 5–40)

## 2020-06-27 LAB — HEPATITIS C ANTIBODY: Hep C Virus Ab: <0.1 s/co ratio (ref 0.0–0.9)

## 2020-06-28 ENCOUNTER — Encounter: Payer: Self-pay | Admitting: Family Medicine

## 2020-07-30 DIAGNOSIS — H90A32 Mixed conductive and sensorineural hearing loss, unilateral, left ear with restricted hearing on the contralateral side: Secondary | ICD-10-CM | POA: Diagnosis not present

## 2020-08-27 DIAGNOSIS — H90A32 Mixed conductive and sensorineural hearing loss, unilateral, left ear with restricted hearing on the contralateral side: Secondary | ICD-10-CM | POA: Diagnosis not present

## 2020-09-11 ENCOUNTER — Other Ambulatory Visit: Payer: Self-pay | Admitting: Cardiovascular Disease

## 2020-09-11 NOTE — Telephone Encounter (Signed)
Pt's age 80, wt 146.7 kg, SCr 1.05, CrCl 118.37, last ov w/ [facility] 09/30/19.

## 2020-09-25 ENCOUNTER — Ambulatory Visit: Payer: PPO | Admitting: Family Medicine

## 2020-09-27 ENCOUNTER — Other Ambulatory Visit: Payer: Self-pay

## 2020-09-27 ENCOUNTER — Ambulatory Visit (INDEPENDENT_AMBULATORY_CARE_PROVIDER_SITE_OTHER): Payer: PPO | Admitting: Family Medicine

## 2020-09-27 ENCOUNTER — Encounter: Payer: Self-pay | Admitting: Family Medicine

## 2020-09-27 VITALS — BP 137/89 | HR 64 | Temp 97.5°F | Ht 70.0 in | Wt 317.0 lb

## 2020-09-27 DIAGNOSIS — G8929 Other chronic pain: Secondary | ICD-10-CM

## 2020-09-27 DIAGNOSIS — M25569 Pain in unspecified knee: Secondary | ICD-10-CM

## 2020-09-27 DIAGNOSIS — M545 Low back pain, unspecified: Secondary | ICD-10-CM

## 2020-09-27 MED ORDER — HYDROMORPHONE HCL 2 MG PO TABS: ORAL_TABLET | ORAL | 0 refills | Status: DC

## 2020-09-27 NOTE — Progress Notes (Signed)
9 10 Dron circumflex there of undetermined straining there  Subjective:    Patient ID: Steven [name], male    DOB: 13-Feb-1941, 80 y.o.   MRN: [id number]  Hypertension    This patient was seen today for chronic pain  The medication list was reviewed and updated.  Location of Pain for which the patient has been treated with regarding narcotics: knees  Onset of this pain: 2 yr   -Compliance with medication: daily   - Number patient states they take daily: 2 pills daily  -when was the last dose patient took? This am  The patient was advised the importance of maintaining medication and not using illegal substances with these.  Here for refills and follow up  The patient was educated that we can provide 3 monthly scripts for their medication, it is their responsibility to follow the instructions.  Side effects or complications from medications: no  Patient is aware that pain medications are meant to minimize the severity of the pain to allow their pain levels to improve to allow for better function. They are aware of that pain medications cannot totally remove their pain.  Due for UDT ( at least once per year) : 06/02/2018   Scale of 1 to 10 ( 1 is least 10 is most) Your pain level without the medicine: 10 Your pain level with medication 7  Scale 1 to 10 ( 1-helps very little, 10 helps very well) How well does your pain medication reduce your pain so you can function better through out the day? some  Quality of the pain: dull pain  Persistence of the pain: on and off  Modifying factors: none He started on Dilaudid years ago after orthopedic aspect and has been on it ever since we have been able to taper him down to 2 mg twice daily but if we try to go below that he has difficulties Chronic bilateral low back pain without sciatica - Plan: HYDROmorphone (DILAUDID) 2 MG tablet, HYDROmorphone (DILAUDID) 2 MG tablet, HYDROmorphone (DILAUDID) 2 MG tablet  Chronic knee pain,  unspecified laterality - Plan: HYDROmorphone (DILAUDID) 2 MG tablet, HYDROmorphone (DILAUDID) 2 MG tablet, HYDROmorphone (DILAUDID) 2 MG tablet     Review of Systems     Objective:   Physical Exam  General-in no acute distress Eyes-no discharge Lungs-respiratory rate normal, CTA CV-no murmurs,RRR Extremities skin warm dry no edema Neuro grossly normal Behavior normal, alert Knee replacement on right side no abnormality is seen patient with subjective discomfort along the lateral aspect      Assessment & Plan:  The patient was seen in followup for chronic pain. A review over at their current pain status was discussed. Drug registry was checked. Prescriptions were given.  Regular follow-up recommended. Discussion was held regarding the importance of compliance with medication as well as pain medication contract.  Patient was informed that medication may cause drowsiness and should not be combined  with other medications/alcohol or street drugs. If the patient feels medication is causing altered alertness then do not drive or operate dangerous equipment.  We will discuss with pharmacy if Dilaudid can be cut in half he is hoping to try to be on the lower amount but he has had a difficult time doing so because of mild withdrawal symptoms

## 2020-10-01 NOTE — Progress Notes (Signed)
[place] , pharmacist stated that there is no problem with cutting the medication in half with the cutter, it is just a matter of size and dexterity of the person cutting it.

## 2020-10-02 ENCOUNTER — Telehealth: Payer: Self-pay | Admitting: Family Medicine

## 2020-10-02 ENCOUNTER — Other Ambulatory Visit: Payer: PPO

## 2020-10-02 NOTE — Telephone Encounter (Signed)
Nurses  Please let patient know that we did communicate with [place].  The pharmacist states that the pain medicine Dilaudid can be cut in half.  If the patient is interested in tapering down on his pain medicine which I believe is a good idea the way I would recommend doing it would be the following  1/2 tablet in the morning 1/2 tablet midday 1/2 tablet in the evening That would be a reduction by 1/2 tablet compared to how he is currently.  Then the patient can give [place] feedback in 1 month how he is doing and we can coach him on tapering this further.  As long as he is able to do so and tolerated.  Obviously if he is having any problems he is to let us know or if he is not interested in doing so he will maintain his current level.

## 2020-10-02 NOTE — Progress Notes (Signed)
See telephone message we will try to taper it a little bit to 1/2 tablet 3 times daily instead of 1 tablet twice daily

## 2020-10-03 NOTE — Telephone Encounter (Signed)
Spoke with the patient's wife, Steven [name] on dpr release form, informed her per drs notes and recommendations. She verbalized understanding and they will give the doctor feedback on progress or if there are any concerns.

## 2020-10-08 ENCOUNTER — Telehealth: Payer: Self-pay | Admitting: Family Medicine

## 2020-10-08 NOTE — Telephone Encounter (Signed)
Yes it is!

## 2020-10-08 NOTE — Telephone Encounter (Signed)
Pt has nurse visit scheduled for tomorrow for "urine test". Is this for a drug screen? Please advise. Thank you

## 2020-10-09 ENCOUNTER — Other Ambulatory Visit: Payer: Self-pay

## 2020-10-09 ENCOUNTER — Other Ambulatory Visit: Payer: PPO | Admitting: *Deleted

## 2020-10-09 DIAGNOSIS — Z79891 Long term (current) use of opiate analgesic: Secondary | ICD-10-CM

## 2020-10-09 LAB — MED LIST ATTACHED SEPARATELY

## 2020-10-17 LAB — TOXASSURE SELECT 13 (MW), URINE

## 2020-10-17 LAB — SPECIMEN STATUS REPORT

## 2020-10-18 ENCOUNTER — Encounter: Payer: Self-pay | Admitting: Family Medicine

## 2020-10-23 ENCOUNTER — Other Ambulatory Visit: Payer: Self-pay | Admitting: Family Medicine

## 2020-10-25 DIAGNOSIS — Z23 Encounter for immunization: Secondary | ICD-10-CM | POA: Diagnosis not present

## 2020-11-06 DIAGNOSIS — H43813 Vitreous degeneration, bilateral: Secondary | ICD-10-CM | POA: Diagnosis not present

## 2020-11-06 DIAGNOSIS — H31001 Unspecified chorioretinal scars, right eye: Secondary | ICD-10-CM | POA: Diagnosis not present

## 2020-11-06 DIAGNOSIS — H04123 Dry eye syndrome of bilateral lacrimal glands: Secondary | ICD-10-CM | POA: Diagnosis not present

## 2020-11-16 ENCOUNTER — Other Ambulatory Visit: Payer: Self-pay | Admitting: Family Medicine

## 2020-12-12 ENCOUNTER — Other Ambulatory Visit: Payer: Self-pay

## 2020-12-12 ENCOUNTER — Encounter: Payer: Self-pay | Admitting: Cardiovascular Disease

## 2020-12-12 ENCOUNTER — Ambulatory Visit: Payer: PPO | Admitting: Cardiovascular Disease

## 2020-12-12 VITALS — BP 114/82 | HR 75 | Ht 70.0 in | Wt 314.6 lb

## 2020-12-12 DIAGNOSIS — I350 Nonrheumatic aortic (valve) stenosis: Secondary | ICD-10-CM

## 2020-12-12 DIAGNOSIS — I2510 Atherosclerotic heart disease of native coronary artery without angina pectoris: Secondary | ICD-10-CM | POA: Diagnosis not present

## 2020-12-12 DIAGNOSIS — I4819 Other persistent atrial fibrillation: Secondary | ICD-10-CM | POA: Diagnosis not present

## 2020-12-12 NOTE — Patient Instructions (Addendum)
Medication Instructions:  Your physician recommends that you continue on your current medications as directed. Please refer to the Current Medication list given to you today.  *If you need a refill on your cardiac medications before your next appointment, please call your pharmacy*   Lab Work: None Ordered If you have labs (blood work) drawn today and your tests are completely normal, you will receive your results only by: [place] (if you have MyChart) OR A paper copy in the mail If you have any lab test that is abnormal or we need to change your treatment, we will call you to review the results.   Testing/Procedures: Your physician has requested that you have an echocardiogram in 1 year prior to your office visit. Echocardiography is a painless test that uses sound waves to create images of your heart. It provides your doctor with information about the size and shape of your heart and how well your heart's chambers and valves are working. This procedure takes approximately one hour. There are no restrictions for this procedure.   Follow-Up: At [facility], you and your health needs are our priority.  As part of our continuing mission to provide you with exceptional heart care, we have created designated Provider Care Teams.  These Care Teams include your primary Cardiologist (physician) and Advanced Practice Providers (APPs -  Physician Assistants and Nurse Practitioners) who all work together to provide you with the care you need, when you need it.   Your next appointment:   1 year(s)  The format for your next appointment:   In Person  Provider:   You may see Sherren Mocha, MD or one of the following Advanced Practice Providers on your designated Care Team:   Richardson Dopp, PA-C [place], [place]

## 2020-12-12 NOTE — Progress Notes (Signed)
Cardiology Office Note:    Date:  12/12/2020   ID:  Steven [name], DOB 1940/05/26, MRN [id number]  PCP:  Kathyrn Drown, MD   [facility] Providers Cardiologist:  Sherren Mocha, MD     Referring MD: Kathyrn Drown, MD   Chief Complaint  Patient presents with   Coronary Artery Disease     History of Present Illness:    Steven [name] is a 80 y.o. male with a hx of coronary artery disease and atrial fibrillation, presenting for follow-up evaluation.  The patient is here with his wife today.  He has been doing fairly well.  He has some problems with his right knee and this limits his physical activity.  He denies chest pain, chest pressure, heart palpitations, or shortness of breath.  The patient is compliant with his medications.  He has had no bleeding problems on apixaban.  Past Medical History:  Diagnosis Date   Chronic pain    Complication of anesthesia    pt woke up during last surgery    Coronary artery disease 02-27-11   Dr. Jacquiline Doe follows-has some blocked coronary arteries ,not suitable for stent  placement   Dyslipidemia    Dysrhythmia    hx of atrial fib during last hospitalization    GERD (gastroesophageal reflux disease) 02-27-11   Acid reflux   Hearing loss 02-27-11   Bilateral hearing aids-due to exposure to loud machinery   Hemorrhoids 02-27-11   not bothersome at this time   Hyperlipidemia    Hypertension    Leg swelling    Obesity    Osteoarthritis 02-27-11   Ostearthritis-knees, shoulders.Back causes chronic pain-radiates down right leg   Rash    Seizures ([place]) 07/2014   Shingles outbreak 02-27-11   2 weeks ago , was tx.-only residual is tenderness of right scalp-no open areas   Shortness of breath    with exertion    Urinary incontinence 02-27-11   not an everday occurrence-no special measures    Past Surgical History:  Procedure Laterality Date   CHOLECYSTECTOMY  03/14/2011   Procedure: LAPAROSCOPIC CHOLECYSTECTOMY;  Surgeon:  Edward Jolly, MD;  Location: [facility];  Service: General;  Laterality: N/A;   COLONOSCOPY     INJECTION KNEE  03/05/2011   Procedure: KNEE INJECTION;  Surgeon: Gearlean Alf;  Location: [facility];  Service: Orthopedics;  Laterality: Left;  80 mg depomedrol   LAPAROSCOPY  03/16/2011   Procedure: LAPAROSCOPY DIAGNOSTIC;  Surgeon: Judieth Keens, DO;  Location: [facility];  Service: General;  Laterality: N/A;  repair incisional hernia   REPLACEMENT TOTAL KNEE  02-27-11   right- 2007   TOTAL KNEE ARTHROPLASTY  03/01/2012   Procedure: TOTAL KNEE ARTHROPLASTY;  Surgeon: Gearlean Alf, MD;  Location: [facility];  Service: Orthopedics;  Laterality: Left;   TOTAL KNEE REVISION  03/05/2011   Procedure: TOTAL KNEE REVISION;  Surgeon: Dione Plover Aluisio;  Location: [facility];  Service: Orthopedics;  Laterality: Right;    Current Medications: Current Meds  Medication Sig   atorvastatin (LIPITOR) 40 MG tablet TAKE ONE TABLET BY MOUTH ONCE DAILY.   cholecalciferol (VITAMIN D) 1000 UNITS tablet Take 1,000 Units by mouth daily.   Cyanocobalamin (VITAMIN B-12 PO) Take 1,000 mg by mouth daily.   DULoxetine (CYMBALTA) 60 MG capsule TAKE (1) CAPSULE BY MOUTH ONCE DAILY.   ELIQUIS 5 MG TABS tablet TAKE (1) TABLET BY MOUTH TWICE DAILY.   HYDROmorphone (DILAUDID) 2 MG tablet Take one bid  prn pain   Omega-3 Fatty Acids (FISH OIL) 1000 MG CAPS Take 1,000 mg by mouth daily.   pantoprazole (PROTONIX) 40 MG tablet TAKE (1) TABLET BY MOUTH ONCE DAILY.   Probiotic Product (ALIGN PO) Take by mouth every morning.   tamsulosin (FLOMAX) 0.4 MG CAPS capsule Take 1 capsule (0.4 mg total) by mouth daily.   zonisamide (ZONEGRAN) 100 MG capsule Take 2 capsules (200 mg total) by mouth daily.     Allergies:   Ciprofloxacin, Oxycodone-acetaminophen, and Oxycodone-acetaminophen   Social History   Socioeconomic History   Marital status: Married    Spouse name: Not on file   Number of children: 3   Years of education: 12    Highest education level: Not on file  Occupational History   Occupation: Retired    Fish farm manager: RETIRED  Tobacco Use   Smoking status: Former   Smokeless tobacco: Former    Types: Chew    Quit date: 01/27/1973   Tobacco comments:    chewing   Vaping Use   Vaping Use: Never used  Substance and Sexual Activity   Alcohol use: No   Drug use: No   Sexual activity: Yes    Birth control/protection: None  Other Topics Concern   Not on file  Social History Narrative   Lives at home w/ his wife   Right-handed   Caffeine: 2 cups/day   Social Determinants of Health   Financial Resource Strain: Not on file  Food Insecurity: Not on file  Transportation Needs: Not on file  Physical Activity: Not on file  Stress: Not on file  Social Connections: Not on file     Family History: The patient's family history includes COPD in his father; Cancer in his father; Heart attack in his father; Heart attack (age of onset: 83) in his sister; Heart disease in his mother; Hypertension in his mother; Other (age of onset: 70) in his mother. There is no history of Seizures, Transient ischemic attack, or Stroke.  ROS:   Please see the history of present illness.    All other systems reviewed and are negative.  EKGs/Labs/Other Studies Reviewed:    The following studies were reviewed today: 2D echocardiogram 09/30/2019: 1. Left ventricular ejection fraction, by estimation, is 60 to 65%. The  left ventricle has normal function. The left ventricle has no regional  wall motion abnormalities. There is moderate concentric left ventricular  hypertrophy. Left ventricular  diastolic function could not be evaluated.   2. Right ventricular systolic function is normal. The right ventricular  size is normal. There is normal pulmonary artery systolic pressure. The  estimated right ventricular systolic pressure is 0000000 mmHg.   3. Left atrial size was severely dilated.   4. Right atrial size was mildly dilated.    5. The mitral valve is normal in structure. Mild mitral valve  regurgitation. No evidence of mitral stenosis.   6. The aortic valve is normal in structure. Aortic valve regurgitation is  mild. Mild to moderate aortic valve stenosis. Aortic valve mean gradient  measures 18.0 mmHg.   7. Aortic dilatation noted. There is mild dilatation of the ascending  aorta measuring 42 mm.   8. The inferior vena cava is normal in size with greater than 50%  respiratory variability, suggesting right atrial pressure of 3 mmHg.   EKG:  EKG is ordered today.  The ekg ordered today demonstrates atrial fibrillation 75 bpm, otherwise within normal limits  Recent Labs: 06/26/2020: BUN 22; Creatinine, Ser 1.05;  Potassium 4.6; Sodium 143  Recent Lipid Panel    Component Value Date/Time   CHOL 120 06/26/2020 0802   TRIG 73 06/26/2020 0802   HDL 41 06/26/2020 0802   CHOLHDL 2.9 06/26/2020 0802   CHOLHDL 3.4 07/20/2015 0435   VLDL 19 07/20/2015 0435   LDLCALC 64 06/26/2020 0802     Risk Assessment/Calculations:    CHA2DS2-VASc Score = 5  This indicates a 7.2% annual risk of stroke. The patient's score is based upon: CHF History: No HTN History: Yes Diabetes History: Yes Stroke History: No Vascular Disease History: Yes Age Score: 2 Gender Score: 0         Physical Exam:    VS:  BP 114/82   Pulse 75   Ht '5\' 10"'$  (1.778 m)   Wt (!) 314 lb 9.6 oz (142.7 kg)   SpO2 97%   BMI 45.14 kg/m     Wt Readings from Last 3 Encounters:  12/12/20 (!) 314 lb 9.6 oz (142.7 kg)  09/27/20 (!) 317 lb (143.8 kg)  06/25/20 (!) 323 lb 6.4 oz (146.7 kg)     GEN:  Well nourished, well developed obese male in no acute distress HEENT: Normal NECK: No JVD; No carotid bruits LYMPHATICS: No lymphadenopathy CARDIAC: Irregularly irregular with 2/6 systolic murmur at the right upper sternal border RESPIRATORY:  Clear to auscultation without rales, wheezing or rhonchi  ABDOMEN: Soft, non-tender,  non-distended MUSCULOSKELETAL:  No edema; No deformity  SKIN: Warm and dry NEUROLOGIC:  Alert and oriented x 3 PSYCHIATRIC:  Normal affect   ASSESSMENT:    1. Nonrheumatic aortic valve stenosis   2. Coronary artery disease involving native coronary artery of native heart without angina pectoris   3. Persistent atrial fibrillation (HCC)    PLAN:    In order of problems listed above:  Physical exam unchanged.  Last year's echo reviewed demonstrating mild to moderate aortic stenosis with a mean gradient of 18 mmHg.  Recommend repeat study in 1 year.  Natural history of aortic stenosis reviewed with the patient and his wife today. Stable with no angina.  No antiplatelet therapy because of chronic oral anticoagulation.  The patient remains on high intensity statin drug. Heart rate controlled, seems to tolerate well.  Last year's echo reviewed with severe left atrial enlargement and normal LV function.  Continue current management.  Does not require rate controlling medication.  Repeat echocardiogram in 1 year.     Medication Adjustments/Labs and Tests Ordered: Current medicines are reviewed at length with the patient today.  Concerns regarding medicines are outlined above.  Orders Placed This Encounter  Procedures   EKG 12-Lead   ECHOCARDIOGRAM COMPLETE    No orders of the defined types were placed in this encounter.   Patient Instructions  Medication Instructions:  Your physician recommends that you continue on your current medications as directed. Please refer to the Current Medication list given to you today.  *If you need a refill on your cardiac medications before your next appointment, please call your pharmacy*   Lab Work: None Ordered If you have labs (blood work) drawn today and your tests are completely normal, you will receive your results only by: [place] (if you have MyChart) OR A paper copy in the mail If you have any lab test that is abnormal or we need  to change your treatment, we will call you to review the results.   Testing/Procedures: Your physician has requested that you have an echocardiogram in 1 year  prior to your office visit. Echocardiography is a painless test that uses sound waves to create images of your heart. It provides your doctor with information about the size and shape of your heart and how well your heart's chambers and valves are working. This procedure takes approximately one hour. There are no restrictions for this procedure.   Follow-Up: At [facility], you and your health needs are our priority.  As part of our continuing mission to provide you with exceptional heart care, we have created designated Provider Care Teams.  These Care Teams include your primary Cardiologist (physician) and Advanced Practice Providers (APPs -  Physician Assistants and Nurse Practitioners) who all work together to provide you with the care you need, when you need it.   Your next appointment:   1 year(s)  The format for your next appointment:   In Person  Provider:   You may see Sherren Mocha, MD or one of the following Advanced Practice Providers on your designated Care Team:   Richardson Dopp, PA-C [place], [place]      Signed, Sherren Mocha, MD  12/12/2020 1:52 PM    [facility]

## 2020-12-28 ENCOUNTER — Ambulatory Visit (INDEPENDENT_AMBULATORY_CARE_PROVIDER_SITE_OTHER): Payer: PPO | Admitting: Family Medicine

## 2020-12-28 ENCOUNTER — Encounter: Payer: Self-pay | Admitting: Family Medicine

## 2020-12-28 ENCOUNTER — Other Ambulatory Visit: Payer: Self-pay

## 2020-12-28 ENCOUNTER — Other Ambulatory Visit: Payer: Self-pay | Admitting: Family Medicine

## 2020-12-28 VITALS — BP 128/78 | HR 68 | Temp 97.3°F | Ht 70.0 in | Wt 318.0 lb

## 2020-12-28 DIAGNOSIS — Z0000 Encounter for general adult medical examination without abnormal findings: Secondary | ICD-10-CM | POA: Diagnosis not present

## 2020-12-28 DIAGNOSIS — Z23 Encounter for immunization: Secondary | ICD-10-CM

## 2020-12-28 MED ORDER — DULOXETINE HCL 60 MG PO CPEP: ORAL_CAPSULE | ORAL | 1 refills | Status: DC

## 2020-12-28 MED ORDER — TAMSULOSIN HCL 0.4 MG PO CAPS: 0.4000 mg | ORAL_CAPSULE | Freq: Every day | ORAL | 1 refills | Status: DC

## 2020-12-28 MED ORDER — ATORVASTATIN CALCIUM 40 MG PO TABS: 40.0000 mg | ORAL_TABLET | Freq: Every day | ORAL | 1 refills | Status: DC

## 2020-12-28 MED ORDER — PANTOPRAZOLE SODIUM 40 MG PO TBEC: DELAYED_RELEASE_TABLET | ORAL | 1 refills | Status: DC

## 2020-12-28 NOTE — Patient Instructions (Signed)
Overall I feel you are doing well  We have sent in refills on your medicines  We will do follow-up lab work in March with an office visit  I am very glad that you have been able to taper off of your pain medicine-good job  Today we will do the senior dose flu shot  In early October I recommend that you do the booster for the COVID-vaccine  Should you have any problems, please let us know [facility]-Dr. Nicki Reaper

## 2020-12-28 NOTE — Progress Notes (Signed)
   Subjective:    Patient ID: Steven [name], male    DOB: 28-Mar-1941, 80 y.o.   MRN: [id number]  HPI AWV- Annual Wellness Visit  The patient was seen for their annual wellness visit. The patient's past medical history, surgical history, and family history were reviewed. Pertinent vaccines were reviewed ( tetanus, pneumonia, shingles, flu) The patient's medication list was reviewed and updated.  The height and weight were entered.  BMI recorded in electronic record elsewhere  Cognitive screening was completed. Outcome of Mini - Cog: 4   Falls /depression screening electronically recorded within record elsewhere  Current tobacco usage:no (All patients who use tobacco were given written and verbal information on quitting)  Recent listing of emergency department/hospitalizations over the past year were reviewed.  current specialist the patient sees on a regular basis: cardiology   Medicare annual wellness visit patient questionnaire was reviewed.  A written screening schedule for the patient for the next 5-10 years was given. Appropriate discussion of followup regarding next visit was discussed.   The patient comes in today for a wellness visit.    A review of their health history was completed.  A review of medications was also completed.  Any needed refills; we will update his medications  Eating habits: Tries to eat relatively healthy but admits he does not always do self  Falls/  MVA accidents in past few months: No falls or injuries recently states he can drive without troubles  Regular exercise: It is difficult for him to do any type of exercise because of his hips and backs as well as his knees  Specialist pt sees on regular basis: Occasionally sees cardiology  Preventative health issues were discussed.   Additional concerns: Has no additional concerns today    Review of Systems     Objective:   Physical Exam  General-in no acute distress Eyes-no  discharge Lungs-respiratory rate normal, CTA CV-no murmurs,RRR Extremities skin warm dry no edema Neuro grossly normal Behavior normal, alert       Assessment & Plan:  1. Encounter for immunization Flu shot high-dose today - Flu Vaccine QUAD High Dose(Fluad)  Adult wellness-complete.wellness physical was conducted today. Importance of diet and exercise were discussed in detail.  In addition to this a discussion regarding safety was also covered. We also reviewed over immunizations and gave recommendations regarding current immunization needed for age.  In addition to this additional areas were also touched on including: Preventative health exams needed:  Colonoscopy not indicated  Patient was advised yearly wellness exam Patient to do COVID-vaccine booster in a few weeks time  Follow-up here in 6 months  Earlier this spring had cholesterol profile taking his medication watching diet He is on Eliquis by cardiology no bleeding issues Moods are doing well would like to continue duloxetine Patient was successful at tapering off of pain medicine which I think is a wonderful thing Uses tamsulosin to help him with his urination Takes his acid blocker on a daily basis no problems

## 2021-01-15 ENCOUNTER — Telehealth: Payer: Self-pay | Admitting: Family Medicine

## 2021-01-15 ENCOUNTER — Other Ambulatory Visit: Payer: Self-pay | Admitting: Family Medicine

## 2021-01-15 NOTE — Telephone Encounter (Signed)
Patient is requesting refill on lisinopril 2.5 mg states completely out. [place]

## 2021-01-16 MED ORDER — LISINOPRIL 2.5 MG PO TABS: 2.5000 mg | ORAL_TABLET | Freq: Every day | ORAL | 1 refills | Status: DC

## 2021-01-16 NOTE — Telephone Encounter (Signed)
May have 90-day with 1 refill

## 2021-01-16 NOTE — Telephone Encounter (Signed)
Prescription sent electronically to pharmacy. Wife(DPR) notified. 

## 2021-01-23 ENCOUNTER — Other Ambulatory Visit: Payer: Self-pay | Admitting: Neurology

## 2021-01-25 ENCOUNTER — Telehealth: Payer: Self-pay

## 2021-01-25 NOTE — Telephone Encounter (Signed)
**Note De-Identified  Obfuscation** The provider page of a BMSPAF application for Eliquis was faxed to the office from [facility] with [employer] in [place] with a request to complete the provider page and to fax to [facility].  I called the pt and s/w his wife Fraser Din [facility]) and she advised me that the pts part of his application was faxed to [facility] already by [employer].  I advised her that we will complete the providers page and will fax to [facility] with a note on cover letter stating that the pts part of his application has already been sent to them and to please add this providers page with the pts application.  She thanked me for calling her to discuss.

## 2021-01-25 NOTE — Telephone Encounter (Signed)
Dr Quentin Ore signed off on the paper work and it has been placed with medical records for faxing and scan.

## 2021-01-31 NOTE — Telephone Encounter (Signed)
**Note De-Identified [name]** Letter received Steven [name] fax from [facility] stating that they have approved the pt for asst with his Eliquis until 04/13/2021. Application Case#: [id number]  The letter states that they have notified the pt of this approval as well.

## 2021-04-11 DIAGNOSIS — Z23 Encounter for immunization: Secondary | ICD-10-CM | POA: Diagnosis not present

## 2021-04-24 ENCOUNTER — Other Ambulatory Visit: Payer: Self-pay | Admitting: Family Medicine

## 2021-05-21 ENCOUNTER — Telehealth: Payer: Self-pay | Admitting: Family Medicine

## 2021-05-21 DIAGNOSIS — R7303 Prediabetes: Secondary | ICD-10-CM

## 2021-05-21 DIAGNOSIS — Z79899 Other long term (current) drug therapy: Secondary | ICD-10-CM

## 2021-05-21 DIAGNOSIS — E785 Hyperlipidemia, unspecified: Secondary | ICD-10-CM

## 2021-05-21 NOTE — Telephone Encounter (Signed)
Patient wanting to know if he needed labs done before appointment on 3/15.

## 2021-05-22 NOTE — Telephone Encounter (Signed)
Labs ordered in epic

## 2021-05-22 NOTE — Telephone Encounter (Signed)
Patient informed labs were ordered for visit and had to be fasting. Verbalized understanding.

## 2021-05-22 NOTE — Telephone Encounter (Signed)
Lipid, liver, metabolic 7, CBC, H0W High risk medication anticoagulation medicine, hyperlipidemia, prediabetes

## 2021-06-07 DIAGNOSIS — G4733 Obstructive sleep apnea (adult) (pediatric): Secondary | ICD-10-CM | POA: Diagnosis not present

## 2021-06-07 DIAGNOSIS — I10 Essential (primary) hypertension: Secondary | ICD-10-CM | POA: Diagnosis not present

## 2021-06-13 DIAGNOSIS — Z79899 Other long term (current) drug therapy: Secondary | ICD-10-CM | POA: Diagnosis not present

## 2021-06-13 DIAGNOSIS — E785 Hyperlipidemia, unspecified: Secondary | ICD-10-CM | POA: Diagnosis not present

## 2021-06-13 DIAGNOSIS — R7303 Prediabetes: Secondary | ICD-10-CM | POA: Diagnosis not present

## 2021-06-14 LAB — CBC WITH DIFFERENTIAL/PLATELET
Basophils Absolute: 0 10*3/uL (ref 0.0–0.2)
Basos: 1 %
EOS (ABSOLUTE): 0.1 10*3/uL (ref 0.0–0.4)
Eos: 2 %
Hematocrit: 46.4 % (ref 37.5–51.0)
Hemoglobin: 15.6 g/dL (ref 13.0–17.7)
Immature Grans (Abs): 0 10*3/uL (ref 0.0–0.1)
Immature Granulocytes: 0 %
Lymphocytes Absolute: 1.4 10*3/uL (ref 0.7–3.1)
Lymphs: 18 %
MCH: 31.1 pg (ref 26.6–33.0)
MCHC: 33.6 g/dL (ref 31.5–35.7)
MCV: 92 fL (ref 79–97)
Monocytes Absolute: 0.5 10*3/uL (ref 0.1–0.9)
Monocytes: 7 %
Neutrophils Absolute: 5.7 10*3/uL (ref 1.4–7.0)
Neutrophils: 72 %
Platelets: 209 10*3/uL (ref 150–450)
RBC: 5.02 x10E6/uL (ref 4.14–5.80)
RDW: 12.3 % (ref 11.6–15.4)
WBC: 7.8 10*3/uL (ref 3.4–10.8)

## 2021-06-14 LAB — LIPID PANEL
Chol/HDL Ratio: 3.7 ratio (ref 0.0–5.0)
Cholesterol, Total: 137 mg/dL (ref 100–199)
HDL: 37 mg/dL — ABNORMAL LOW (ref >39)
LDL Chol Calc (NIH): 84 mg/dL (ref 0–99)
Triglycerides: 83 mg/dL (ref 0–149)
VLDL Cholesterol Cal: 16 mg/dL (ref 5–40)

## 2021-06-14 LAB — BASIC METABOLIC PANEL
BUN/Creatinine Ratio: 13 (ref 10–24)
BUN: 14 mg/dL (ref 8–27)
CO2: 22 mmol/L (ref 20–29)
Calcium: 9.3 mg/dL (ref 8.6–10.2)
Chloride: 103 mmol/L (ref 96–106)
Creatinine, Ser: 1.04 mg/dL (ref 0.76–1.27)
Glucose: 123 mg/dL — ABNORMAL HIGH (ref 70–99)
Potassium: 4.6 mmol/L (ref 3.5–5.2)
Sodium: 139 mmol/L (ref 134–144)
eGFR: 73 mL/min/{1.73_m2} (ref >59)

## 2021-06-14 LAB — HEMOGLOBIN A1C
Est. average glucose Bld gHb Est-mCnc: 131 mg/dL
Hgb A1c MFr Bld: 6.2 % — ABNORMAL HIGH (ref 4.8–5.6)

## 2021-06-14 LAB — HEPATIC FUNCTION PANEL
ALT: 11 IU/L (ref 0–44)
AST: 15 IU/L (ref 0–40)
Albumin: 4.6 g/dL (ref 3.7–4.7)
Alkaline Phosphatase: 82 IU/L (ref 44–121)
Bilirubin Total: 0.4 mg/dL (ref 0.0–1.2)
Bilirubin, Direct: 0.13 mg/dL (ref 0.00–0.40)
Total Protein: 6.6 g/dL (ref 6.0–8.5)

## 2021-06-26 ENCOUNTER — Ambulatory Visit (INDEPENDENT_AMBULATORY_CARE_PROVIDER_SITE_OTHER): Payer: PPO | Admitting: Family Medicine

## 2021-06-26 ENCOUNTER — Other Ambulatory Visit: Payer: Self-pay

## 2021-06-26 ENCOUNTER — Encounter: Payer: Self-pay | Admitting: Family Medicine

## 2021-06-26 VITALS — BP 138/88 | Temp 97.9°F | Wt 313.4 lb

## 2021-06-26 DIAGNOSIS — G4733 Obstructive sleep apnea (adult) (pediatric): Secondary | ICD-10-CM | POA: Diagnosis not present

## 2021-06-26 DIAGNOSIS — E785 Hyperlipidemia, unspecified: Secondary | ICD-10-CM

## 2021-06-26 DIAGNOSIS — G40109 Localization-related (focal) (partial) symptomatic epilepsy and epileptic syndromes with simple partial seizures, not intractable, without status epilepticus: Secondary | ICD-10-CM | POA: Diagnosis not present

## 2021-06-26 DIAGNOSIS — G8929 Other chronic pain: Secondary | ICD-10-CM | POA: Diagnosis not present

## 2021-06-26 DIAGNOSIS — M545 Low back pain, unspecified: Secondary | ICD-10-CM | POA: Diagnosis not present

## 2021-06-26 DIAGNOSIS — I4819 Other persistent atrial fibrillation: Secondary | ICD-10-CM | POA: Diagnosis not present

## 2021-06-26 DIAGNOSIS — R159 Full incontinence of feces: Secondary | ICD-10-CM

## 2021-06-26 DIAGNOSIS — R400 Somnolence: Secondary | ICD-10-CM | POA: Insufficient documentation

## 2021-06-26 DIAGNOSIS — I10 Essential (primary) hypertension: Secondary | ICD-10-CM | POA: Diagnosis not present

## 2021-06-26 DIAGNOSIS — R7303 Prediabetes: Secondary | ICD-10-CM

## 2021-06-26 MED ORDER — PANTOPRAZOLE SODIUM 40 MG PO TBEC: DELAYED_RELEASE_TABLET | ORAL | 1 refills | Status: DC

## 2021-06-26 MED ORDER — LISINOPRIL 2.5 MG PO TABS: 2.5000 mg | ORAL_TABLET | Freq: Every day | ORAL | 1 refills | Status: DC

## 2021-06-26 MED ORDER — TAMSULOSIN HCL 0.4 MG PO CAPS: 0.4000 mg | ORAL_CAPSULE | Freq: Every day | ORAL | 1 refills | Status: DC

## 2021-06-26 MED ORDER — ATORVASTATIN CALCIUM 80 MG PO TABS: 80.0000 mg | ORAL_TABLET | Freq: Every day | ORAL | 3 refills | Status: DC

## 2021-06-26 NOTE — Progress Notes (Signed)
? ?  Subjective:  ? ? Patient ID: Steven [name], male    DOB: 1940/09/17, 81 y.o.   MRN: [id number] ? ?HPI ?Pt here to follow up on blood pressure. Pt states things have been "so-so". Taking all meds as prescribed. Pt not checking blood pressure outside of office. Pt states he does feel more fatigued.  ?Prediabetes ? ?Hyperlipidemia, unspecified hyperlipidemia type ? ?Essential hypertension ? ?Chronic bilateral low back pain without sciatica ? ?Persistent atrial fibrillation ([place]) ? ?Obstructive sleep apnea ? ?Incontinence of feces, unspecified fecal incontinence type ? ?Daytime somnolence ?He does have hyperlipidemia we reviewed over his labs unfortunately blood work does not show great control at this moment bump up dose of Lipitor to get better control ?Blood pressure decent control but it would be better if he stuck with a better diet and exercise more often he is aware of this ?Does have chronic back pain no longer on any type of opioids which is a good thing ?Persistent atrial fibrillation takes his anticoagulant denies any bleeding issues ?See below ?He does relate daytime somnolence has a history of sleep apnea on a CPAP machine may need updated test ?In addition to this has incontinence of feces which causes him to soil himself after bowel movement sometimes an hour later occasionally he states there is a little bit of blood on the tissue but none in the stool ? ?Review of Systems ? ?   ?Objective:  ? Physical Exam ?General-in no acute distress ?Eyes-no discharge ?Lungs-respiratory rate normal, CTA ?CV-no murmurs,RRR ?Extremities skin warm dry no edema ?Neuro grossly normal ?Behavior normal, alert ? ?Rectal tone is poor ?Hemoccult card negative ? ?   ?Assessment & Plan:  ?1. Prediabetes ?Cut back on starches watch portions stay physically active ? ?2. Hyperlipidemia, unspecified hyperlipidemia type ?Bump up dose of Lipitor to get LDL below 70 ? ?3. Essential hypertension ?Blood pressure decent but could be  better he needs to start doing walking monitoring salt intake he will follow-up within 6 months ? ?4. Chronic bilateral low back pain without sciatica ?Stretching exercises no longer on opioids ? ?5. Persistent atrial fibrillation ([place]) ?Takes his anticoagulant denies any problems no bleeding issues ? ?6. Obstructive sleep apnea ?His wife will get [place] the name of the home health group that uses his machine so therefore we can get a download ? ?7. Incontinence of feces, unspecified fecal incontinence type ?Referral to gastroenterology [facility] gastroenterology at times he does see blood on the tissue paper when he wipes but none in the incontinence.  This occurs after having a bowel movement.  Has poor rectal tone.  Hemoccult was negative. ? ?8. Daytime somnolence ?See above discussion above I believe this is related to his sleep apnea ? ?Follow-up within 6 months lab work before next visit ?Patient does have focal epilepsy on medication via neurology.  Doing well ? ?

## 2021-06-26 NOTE — Patient Instructions (Signed)
Hi Donald ? ?It was good to see you today. ?Should he have any additional problems or issues please call us ? ?As we discussed your lab work overall looks good but your cholesterol mildly elevated.  The goal is to get LDL below 70 to keep your risk for heart disease and stroke lowest as possible.  Continue to eat healthy.  Your A1c did go up some to 6.2.  Do the best he can at minimizing starches and sugary foods.  Fit in some walking as you discussed. ? ?Please let us know which healthcare equipment company manages your CPAP machine.  We would like to get a digital download of the effectiveness of this machine.  In some cases a new sleep study is necessary.  This issue may be contributing to your daytime's naps. ? ?Finally I would like to see you back within 6 months.  Have you do some blood work before that visit.  That would be an early September. ? ?We will help set you up with [facility] gastroenterology for follow-up regarding the rectal leakage.  It would be wise to have them check this. ? ?[facility]Care-Dr. Nicki Reaper ?

## 2021-06-27 ENCOUNTER — Ambulatory Visit: Payer: PPO | Admitting: Family Medicine

## 2021-06-27 ENCOUNTER — Telehealth: Payer: Self-pay | Admitting: *Deleted

## 2021-06-27 NOTE — Telephone Encounter (Signed)
Patient's wife called back and stated the patient gets his supplies for CPAP from [employer] since they took over for [employer] but the supplies come from one state and the bill to another state (neither [place]) so she not sure were they are or if they have any kind of ability to monitor CPAP ?

## 2021-06-30 NOTE — Telephone Encounter (Signed)
Please connect with [employer].  Patient gets his CPAP machine through them.  He is wondering if it is still working as good as it used to because he finds himself feeling tired during the day.  Can they do a digital out read of how well the CPAP machine is working for him?  If so please have them do 1. ?

## 2021-07-01 NOTE — Telephone Encounter (Signed)
Caryl Pina at [facility] stated they will contact the patient to have his machine checked ?

## 2021-07-01 NOTE — Telephone Encounter (Signed)
Message sent to [facility] at [facility] ?

## 2021-07-23 ENCOUNTER — Other Ambulatory Visit: Payer: Self-pay | Admitting: Neurology

## 2021-07-23 ENCOUNTER — Telehealth: Payer: Self-pay | Admitting: *Deleted

## 2021-07-23 NOTE — Telephone Encounter (Signed)
Pt was called to schedule a f/u . The phone rang and rang with no option to leave a vm. This is FYI for [place], [place]  ?

## 2021-07-23 NOTE — Telephone Encounter (Signed)
Noted  

## 2021-07-23 NOTE — Telephone Encounter (Signed)
Received refill request on zonegran for pt.  Have not see pt since 2021,  needs appt.  Last refill stated this as well.   ?

## 2021-07-26 ENCOUNTER — Telehealth: Payer: Self-pay | Admitting: Neurology

## 2021-07-26 NOTE — Telephone Encounter (Signed)
Pt is needing a refill for his zonisamide (ZONEGRAN) 100 MG capsule sent to the [place]  ?Pt only has enough till Saturday 4/15 ?

## 2021-07-29 MED ORDER — ZONISAMIDE 100 MG PO CAPS: 200.0000 mg | ORAL_CAPSULE | Freq: Every day | ORAL | 0 refills | Status: DC

## 2021-07-29 NOTE — Telephone Encounter (Signed)
30 day Rx sent to [facility].  ?

## 2021-08-08 ENCOUNTER — Other Ambulatory Visit: Payer: Self-pay | Admitting: Cardiovascular Disease

## 2021-08-08 DIAGNOSIS — I4819 Other persistent atrial fibrillation: Secondary | ICD-10-CM

## 2021-08-08 NOTE — Telephone Encounter (Signed)
Prescription refill request for Eliquis received. ?Indication: Afib   ?Last office visit: 12/12/[name]) ?Scr: 1.04 (06/13/21) ?Age: 81 ?Weight: 142.2kg ? ?Appropriate dose and refill sent to requested pharmacy.  ?

## 2021-08-20 ENCOUNTER — Other Ambulatory Visit: Payer: Self-pay | Admitting: Family Medicine

## 2021-08-26 NOTE — Progress Notes (Signed)
?[facility] ? ? ? ?Provider:  Dr Jaynee Eagles ?Requesting Provider: Kathyrn Drown, MD ?Primary Care Provider:  Kathyrn Drown, MD ? ?CC:  epilepsy ? ?Virtual Visit via Telephone Note ? ?I connected with Steven [name] on 08/26/21 at  8:30 AM EDT by telephone and verified that I am speaking with the correct person using two identifiers. ? ?Location: ?Patient: home  ?Provider: office ?  ?I discussed the limitations, risks, security and privacy concerns of performing an evaluation and management service by telephone and the availability of in person appointments. I also discussed with the patient that there may be a patient responsible charge related to this service. The patient expressed understanding and agreed to proceed. ? ? ?Follow Up Instructions: ? ?  ?I discussed the assessment and treatment plan with the patient. The patient was provided an opportunity to ask questions and all were answered. The patient agreed with the plan and demonstrated an understanding of the instructions. ?  ?The patient was advised to call back or seek an in-person evaluation if the symptoms worsen or if the condition fails to improve as anticipated. ? ?I provided over 21 minutes of non-face-to-face time during this encounter. ? ? ?Melvenia Beam, MD ? ?08/27/2021: He is doing well, no problems, no  seizures, no side effects, on '200mg'$  zonisamide at bedtime, keep him on it for life, since stable and doing so well, has not had an episode for years, will ask Dr. Wolfgang Phoenix to refill and send back to primary care.  I reviewed his history, he was diagnosed with focal epilepsy last seen in November 2021, EEG was abnormal with intermittent left frontal temporal sharp transients consistent with potentially an epileptiform activity, he has a history of seizures since April 2017, he could not tolerate Keppra, Vimpat was too expensive, he is doing well on zonisamide at this time. ? ?Patient complains of symptoms per HPI as well as the  following symptoms: none . Pertinent negatives and positives per HPI. All others negative ? ? ?Interval history 02/27/2020: EEG 72-hour ambulatory January 31, 2020 to February 03, 2020 was abnormal: There were intermittent left frontal temporal sharp transients seen and noted, findings consistent with left frontal/anterior temporal sharp wave activity, potentially epileptiform in nature which places this patient at an increased risk for focal epilepsy.  I discussed with the patient and wife today, suggested antiepilepsy medications for life.  If he has any more episodes we can increase his zonisamide to 300 mg a day.  They understood.  We also discussed driving, he has not had a seizure for 6 months I would prefer if he waited till after the holidays he can drive in January. ? ? ? ?HPI:  Steven [name] is a 81 y.o. male here as requested by Kathyrn Drown, MD for speech disturbance. PMHx obesity, hypertension, diabetes, chronic low back pain, seizure disorder for which he has been seen in the past, he has a history of seizure of April 2017, he could not tolerate Keppra, repeat EEG was normal in the past, he stopped his Keppra and declined switching to another seizure medication.  From a review of notes he also discussed fatigue which may be due to multiple reasons including polypharmacy, obesity, lifestyle and depression.  The seizure was in 2016/2017, wife walked in and he was jerking all four limbs, drooling on the sides of the mouth, eyes closed, he became combative and "out of it", since he stopped Keppra last time I saw him  he had not had a repeat event.  Seizure was unprovoked. ? ?I reviewed Dr. Lance Sell notes concerning possible TIA.  Patient had a spell where he could not speak lasted less than a few seconds, the spells occur he will seem out of it but awake and unable to respond to her and will take several moments before is able to make any sense, patient denied any unilateral numbness weakness, any  headaches, he has been worked up in the past with a normal EEG and is also had an MRI MRA  4 years ago due to TIAs.  Dr. Wolfgang Phoenix  ?e ordered MRI of the brain with and without contrast, MR angio of the head without contrast and ultrasound carotid bilaterally. ? ?Wife gives most information, back in November he had "spells" he was not there, confused, wasn;t understanding, just a few seconds and then he would be ok. They would happen during the day, he had  Few of them unknown how many, he saw his cardiologist in December and told him about it and he was on a heart monitor for a while. He is compliant with his Eliquis. In March he had several more, he doesn't pass out, he doesn't lose consciousness, one time he wa at the kitchen table and she asked him multiple questions and she kept asking him the same question and when he answered it didn't make sense. The most recent was in August, he did not remember it, lasted a minute or so, afterwards it is just normal. He has sleep apnea and compliant.  ? ?Reviewed notes, labs and imaging from outside physicians, which showed: ? ?MRI brain/MRA head 10/15/2019: personally reviewed and agree with the following ? ?IMPRESSION: ?1. No acute intracranial abnormality. ?2. Mild chronic microvascular ischemic disease for age, mildly ?progressed relative to 2017. ?3. Stable negative intracranial MRA. ? ?US carotids 10/06/2019: ?Color duplex indicates minimal heterogeneous plaque, with no ?hemodynamically significant stenosis by duplex criteria in the ?extracranial cerebrovascular circulation. ? ?Review of Systems: ?Patient complains of symptoms per HPI as well as the following symptoms: alteration of awareness. Pertinent negatives and positives per HPI. All others negative. ? ? ?Social History  ? ?Socioeconomic History  ? Marital status: Married  ?  Spouse name: Not on file  ? Number of children: 3  ? Years of education: 36  ? Highest education level: Not on file  ?Occupational History  ?  Occupation: Retired  ?  Employer: RETIRED  ?Tobacco Use  ? Smoking status: Former  ? Smokeless tobacco: Former  ?  Types: Chew  ?  Quit date: 01/27/1973  ? Tobacco comments:  ?  chewing   ?Vaping Use  ? Vaping Use: Never used  ?Substance and Sexual Activity  ? Alcohol use: No  ? Drug use: No  ? Sexual activity: Yes  ?  Birth control/protection: None  ?Other Topics Concern  ? Not on file  ?Social History Narrative  ? Lives at home w/ his wife  ? Right-handed  ? Caffeine: 2 cups/day  ? ?Social Determinants of Health  ? ?Financial Resource Strain: Not on file  ?Food Insecurity: Not on file  ?Transportation Needs: Not on file  ?Physical Activity: Not on file  ?Stress: Not on file  ?Social Connections: Not on file  ?Intimate Partner Violence: Not on file  ? ? ?Family History  ?Problem Relation Age of Onset  ? Heart attack Father   ?     mi I N HIS 40'S BUT LIVED INTO HIS [phone]  WITH COPD  ? COPD Father   ? Cancer Father   ?     colon  ? Other Mother 66  ?     died multiple med problems  ? Heart disease Mother   ? Hypertension Mother   ? Heart attack Sister 60  ? Seizures Neg Hx   ? Transient ischemic attack Neg Hx   ? Stroke Neg Hx   ? ? ?Past Medical History:  ?Diagnosis Date  ? Chronic pain   ? Complication of anesthesia   ? pt woke up during last surgery   ? Coronary artery disease 02-27-11  ? Dr. Jacquiline Doe follows-has some blocked coronary arteries ,not suitable for stent  placement  ? Dyslipidemia   ? Dysrhythmia   ? hx of atrial fib during last hospitalization   ? GERD (gastroesophageal reflux disease) 02-27-11  ? Acid reflux  ? Hearing loss 02-27-11  ? Bilateral hearing aids-due to exposure to loud machinery  ? Hemorrhoids 02-27-11  ? not bothersome at this time  ? Hyperlipidemia   ? Hypertension   ? Leg swelling   ? Obesity   ? Osteoarthritis 02-27-11  ? Ostearthritis-knees, shoulders.Back causes chronic pain-radiates down right leg  ? Rash   ? Seizures ([place]) 07/2014  ? Shingles outbreak 02-27-11  ? 2  weeks ago , was tx.-only residual is tenderness of right scalp-no open areas  ? Shortness of breath   ? with exertion   ? Urinary incontinence 02-27-11  ? not an everday occurrence-no special measures  ? ? ?Fraser Din

## 2021-08-27 ENCOUNTER — Ambulatory Visit (INDEPENDENT_AMBULATORY_CARE_PROVIDER_SITE_OTHER): Payer: PPO | Admitting: Neurology

## 2021-08-27 DIAGNOSIS — G40109 Localization-related (focal) (partial) symptomatic epilepsy and epileptic syndromes with simple partial seizures, not intractable, without status epilepticus: Secondary | ICD-10-CM | POA: Diagnosis not present

## 2021-08-27 MED ORDER — ZONISAMIDE 100 MG PO CAPS: 200.0000 mg | ORAL_CAPSULE | Freq: Every day | ORAL | 4 refills | Status: DC

## 2021-08-27 NOTE — Patient Instructions (Signed)
Continue Zonisamide at bedtime ? ?Per [facility] statutes, patients with seizures are not allowed to drive until they have been seizure-free for six months.  ?  ?Use caution when using heavy equipment or power tools. Avoid working on ladders or at heights. Take showers instead of baths. Ensure the water temperature is not too high on the home water heater. Do not go swimming alone. Do not lock yourself in a room alone (i.e. bathroom). When caring for infants or small children, sit down when holding, feeding, or changing them to minimize risk of injury to the child in the event you have a seizure. Maintain good sleep hygiene. Avoid alcohol.  ?  ?If patient has another seizure, call 911 and bring them back to the ED if: ?A.  The seizure lasts longer than 5 minutes.      ?B.  The patient doesn't wake shortly after the seizure or has new problems such as difficulty seeing, speaking or moving following the seizure ?C.  The patient was injured during the seizure ?D.  The patient has a temperature over 102 F (39C) ?E.  The patient vomited during the seizure and now is having trouble breathing ? ?Per [facility] statutes, patients with seizures are not allowed to drive until they have been seizure-free for six months.  Other recommendations include using caution when using heavy equipment or power tools. Avoid working on ladders or at heights. Take showers instead of baths.  Do not swim alone.  Ensure the water temperature is not too high on the home water heater. Do not go swimming alone. Do not lock yourself in a room alone (i.e. bathroom). When caring for infants or small children, sit down when holding, feeding, or changing them to minimize risk of injury to the child in the event you have a seizure. Maintain good sleep hygiene. Avoid alcohol.  Also recommend adequate sleep, hydration, good diet and minimize stress. ? ?During the Seizure ? ?- First, ensure adequate ventilation and place patients on the  floor on their left side  ?Loosen clothing around the neck and ensure the airway is patent. If the patient is clenching the teeth, do not force the mouth open with any object as this can cause severe damage ?- Remove all items from the surrounding that can be hazardous. The patient may be oblivious to what's happening and may not even know what he or she is doing. ?If the patient is confused and wandering, either gently guide him/her away and block access to outside areas ?- Reassure the individual and be comforting ?- Call 911. In most cases, the seizure ends before EMS arrives. However, there are cases when seizures may last over 3 to 5 minutes. Or the individual may have developed breathing difficulties or severe injuries. If a pregnant patient or a person with diabetes develops a seizure, it is prudent to call an ambulance. ?- Finally, if the patient does not regain full consciousness, then call EMS. Most patients will remain confused for about 45 to 90 minutes after a seizure, so you must use judgment in calling for help. ?- Avoid restraints but make sure the patient is in a bed with padded side rails ?- Place the individual in a lateral position with the neck slightly flexed; this will help the saliva drain from the mouth and prevent the tongue from falling backward ?- Remove all nearby furniture and other hazards from the area ?- Provide verbal assurance as the individual is regaining consciousness ?-  Provide the patient with privacy if possible ?- Call for help and start treatment as ordered by the caregiver ? ? fter the Seizure (Postictal Stage) ? ?After a seizure, most patients experience confusion, fatigue, muscle pain and/or a headache. Thus, one should permit the individual to sleep. For the next few days, reassurance is essential. Being calm and helping reorient the person is also of importance. ? ?Most seizures are painless and end spontaneously. Seizures are not harmful to others but can lead to  complications such as stress on the lungs, brain and the heart. Individuals with prior lung problems may develop labored breathing and respiratory distress.  ?Seizure, Adult ?A seizure is a sudden burst of abnormal electrical and chemical activity in the brain. Seizures usually last from 30 seconds to 2 minutes. The abnormal activity temporarily interrupts normal brain function. ?Many types of seizures can affect adults. A seizure can cause many different symptoms depending on where in the brain it starts. ?What are the causes? ?Common causes of this condition include: ?Fever or infection. ?Brain injury, head trauma, bleeding in the brain, or a brain tumor. ?Low levels of blood sugar or salt (sodium). ?Kidney problems or liver problems. ?Metabolic disorders or other conditions that are passed from parent to child (are inherited). ?Reaction to a substance, such as a drug or a medicine, or suddenly stopping the use of a substance (withdrawal). ?A stroke. ?Developmental disorders such as autism spectrum disorder or cerebral palsy. ?In some cases, the cause of a seizure may not be known. Some people who have a seizure never have another one. A person who has repeated seizures over time without a clear cause has a condition called epilepsy. ?What increases the risk? ?You are more likely to develop this condition if: ?You have a family history of epilepsy. ?You have had a tonic-clonic seizure before. This type of seizure causes tightening (contraction) of the muscles of the whole body and loss of consciousness. ?You have a history of head trauma, lack of oxygen at birth, or strokes. ?What are the signs or symptoms? ?There are many different types of seizures. The symptoms vary depending on the type of seizure you have. Symptoms occur during the seizure. They may also occur before a seizure (aura) and after a seizure (postictal). Symptoms may include the following: ?Symptoms during a seizure ?Uncontrollable shaking  (convulsions) with fast, jerky movements of muscles. ?Stiffening of the body. ?Breathing problems. ?Confusion, staring, or unresponsiveness. ?Head nodding, eye blinking or fluttering, or rapid eye movements. ?Drooling, grunting, or making clicking sounds with your mouth. ?Loss of bladder control and bowel control. ?Symptoms before a seizure ?Fear or anxiety. ?Nausea. ?Vertigo. This is a feeling like: ?You are moving when you are not. ?Your surroundings are moving when they are not. ?D?j? vu. This is a feeling of having seen or heard something before. ?Odd tastes or smells. ?Changes in vision, such as seeing flashing lights or spots. ?Symptoms after a seizure ?Confusion. ?Sleepiness. ?Headache. ?Sore muscles. ?How is this diagnosed? ?This condition may be diagnosed based on: ?A description of your symptoms. Video of your seizures can be helpful. ?Your medical history. ?A physical exam. ?You may also have tests, including: ?Blood tests. ?CT scan. ?MRI. ?Electroencephalogram (EEG). This test measures electrical activity in the brain. An EEG can predict whether seizures will return. ?A spinal tap, also called a lumbar puncture. This is the removal and testing of fluid that surrounds the brain and spinal cord. ?How is this treated? ?Most seizures will stop on  their own in less than 5 minutes, and no treatment is needed. Seizures that last longer than 5 minutes will usually need treatment. ?Seizures may be treated with: ?Medicines given through an IV. ?Avoiding known triggers, such as medicines that you take for another condition. ?Medicines to control seizures or prevent future seizures (antiepileptics), if epilepsy caused your seizures. ?Medical devices to prevent and control seizures. ?Surgery to stop seizures or to reduce how often seizures happen, if you have epilepsy that does not respond to medicines. ?A diet low in carbohydrates and high in fat (ketogenic diet). ?Follow these instructions at home: ?Medicines ?Take  over-the-counter and prescription medicines only as told by your health care provider. ?Avoid any substances that may prevent your medicine from working properly, such as alcohol. ?Activity ?Follow instructions a

## 2021-10-24 ENCOUNTER — Other Ambulatory Visit: Payer: Self-pay | Admitting: Family Medicine

## 2021-10-29 ENCOUNTER — Other Ambulatory Visit: Payer: Self-pay | Admitting: Family Medicine

## 2021-11-14 ENCOUNTER — Ambulatory Visit (HOSPITAL_COMMUNITY): Payer: PPO | Attending: Cardiology

## 2021-11-14 DIAGNOSIS — I4819 Other persistent atrial fibrillation: Secondary | ICD-10-CM | POA: Insufficient documentation

## 2021-11-14 DIAGNOSIS — I2510 Atherosclerotic heart disease of native coronary artery without angina pectoris: Secondary | ICD-10-CM | POA: Insufficient documentation

## 2021-11-14 DIAGNOSIS — I350 Nonrheumatic aortic (valve) stenosis: Secondary | ICD-10-CM | POA: Insufficient documentation

## 2021-11-14 LAB — ECHOCARDIOGRAM COMPLETE
AR max vel: 1.02 cm2
AV Area VTI: 1.08 cm2
AV Area mean vel: 0.95 cm2
AV Mean grad: 19 mmHg
AV Peak grad: 34.1 mmHg
Ao pk vel: 2.92 m/s
P 1/2 time: 811 ms
S' Lateral: 2.2 cm

## 2021-11-18 DIAGNOSIS — H04123 Dry eye syndrome of bilateral lacrimal glands: Secondary | ICD-10-CM | POA: Diagnosis not present

## 2021-11-18 DIAGNOSIS — H524 Presbyopia: Secondary | ICD-10-CM | POA: Diagnosis not present

## 2021-11-18 DIAGNOSIS — H26493 Other secondary cataract, bilateral: Secondary | ICD-10-CM | POA: Diagnosis not present

## 2021-11-18 DIAGNOSIS — H31001 Unspecified chorioretinal scars, right eye: Secondary | ICD-10-CM | POA: Diagnosis not present

## 2021-11-19 DIAGNOSIS — H26491 Other secondary cataract, right eye: Secondary | ICD-10-CM | POA: Diagnosis not present

## 2021-11-26 DIAGNOSIS — H26492 Other secondary cataract, left eye: Secondary | ICD-10-CM | POA: Diagnosis not present

## 2021-12-02 ENCOUNTER — Encounter: Payer: Self-pay | Admitting: Cardiovascular Disease

## 2021-12-02 ENCOUNTER — Ambulatory Visit: Payer: PPO | Admitting: Cardiovascular Disease

## 2021-12-02 VITALS — BP 118/80 | HR 62 | Ht 70.0 in | Wt 306.4 lb

## 2021-12-02 DIAGNOSIS — I350 Nonrheumatic aortic (valve) stenosis: Secondary | ICD-10-CM | POA: Diagnosis not present

## 2021-12-02 DIAGNOSIS — I4819 Other persistent atrial fibrillation: Secondary | ICD-10-CM | POA: Diagnosis not present

## 2021-12-02 DIAGNOSIS — I2510 Atherosclerotic heart disease of native coronary artery without angina pectoris: Secondary | ICD-10-CM | POA: Diagnosis not present

## 2021-12-02 NOTE — Patient Instructions (Signed)
Medication Instructions:  Your physician recommends that you continue on your current medications as directed. Please refer to the Current Medication list given to you today.  *If you need a refill on your cardiac medications before your next appointment, please call your pharmacy*   Follow-Up: At [facility], you and your health needs are our priority.  As part of our continuing mission to provide you with exceptional heart care, we have created designated Provider Care Teams.  These Care Teams include your primary Cardiologist (physician) and Advanced Practice Providers (APPs -  Physician Assistants and Nurse Practitioners) who all work together to provide you with the care you need, when you need it.  We recommend signing up for the patient portal called "MyChart".  Sign up information is provided on this After Visit Summary.  MyChart is used to connect with patients for Virtual Visits (Telemedicine).  Patients are able to view lab/test results, encounter notes, upcoming appointments, etc.  Non-urgent messages can be sent to your provider as well.   To learn more about what you can do with MyChart, go to [email or web address].    Your next appointment:   1 year(s)  The format for your next appointment:   In Person  Provider:   Sherren Mocha, MD {

## 2021-12-02 NOTE — Progress Notes (Signed)
Cardiology Office Note:    Date:  12/02/2021   ID:  Steven [name], DOB Feb 03, 1941, MRN [id number]  PCP:  Kathyrn Drown, MD   [place] Providers Cardiologist:  Sherren Mocha, MD     Referring MD: Kathyrn Drown, MD   Chief Complaint  Patient presents with   Coronary Artery Disease    History of Present Illness:    Steven [name] is a 81 y.o. male with a hx of coronary artery disease and atrial fibrillation, presenting for follow-up evaluation.  The patient is here with his wife today.  He has been doing fairly well.    The patient is here with his wife today.  He continues to do well from a standpoint of cardiac symptoms.  He denies chest pain, shortness of breath with normal activities, heart palpitations, orthopnea, or PND.  He is limited by his knees.  He does not do any regular exercise and has not really participated in any aerobic exercise in many years.  He states that he is still able to do some physical work though.  Past Medical History:  Diagnosis Date   Chronic pain    Complication of anesthesia    pt woke up during last surgery    Coronary artery disease 02-27-11   Dr. Jacquiline Doe follows-has some blocked coronary arteries ,not suitable for stent  placement   Dyslipidemia    Dysrhythmia    hx of atrial fib during last hospitalization    GERD (gastroesophageal reflux disease) 02-27-11   Acid reflux   Hearing loss 02-27-11   Bilateral hearing aids-due to exposure to loud machinery   Hemorrhoids 02-27-11   not bothersome at this time   Hyperlipidemia    Hypertension    Leg swelling    Obesity    Osteoarthritis 02-27-11   Ostearthritis-knees, shoulders.Back causes chronic pain-radiates down right leg   Rash    Seizures ([place]) 07/2014   Shingles outbreak 02-27-11   2 weeks ago , was tx.-only residual is tenderness of right scalp-no open areas   Shortness of breath    with exertion    Urinary incontinence 02-27-11   not an everday  occurrence-no special measures    Past Surgical History:  Procedure Laterality Date   CHOLECYSTECTOMY  03/14/2011   Procedure: LAPAROSCOPIC CHOLECYSTECTOMY;  Surgeon: Edward Jolly, MD;  Location: [facility];  Service: General;  Laterality: N/A;   COLONOSCOPY     INJECTION KNEE  03/05/2011   Procedure: KNEE INJECTION;  Surgeon: Gearlean Alf;  Location: [facility];  Service: Orthopedics;  Laterality: Left;  80 mg depomedrol   LAPAROSCOPY  03/16/2011   Procedure: LAPAROSCOPY DIAGNOSTIC;  Surgeon: Judieth Keens, DO;  Location: [facility];  Service: General;  Laterality: N/A;  repair incisional hernia   REPLACEMENT TOTAL KNEE  02-27-11   right- 2007   TOTAL KNEE ARTHROPLASTY  03/01/2012   Procedure: TOTAL KNEE ARTHROPLASTY;  Surgeon: Gearlean Alf, MD;  Location: [facility];  Service: Orthopedics;  Laterality: Left;   TOTAL KNEE REVISION  03/05/2011   Procedure: TOTAL KNEE REVISION;  Surgeon: Dione Plover Aluisio;  Location: [facility];  Service: Orthopedics;  Laterality: Right;    Current Medications: Current Meds  Medication Sig   apixaban (ELIQUIS) 5 MG TABS tablet TAKE (1) TABLET BY MOUTH TWICE DAILY.   atorvastatin (LIPITOR) 80 MG tablet Take 1 tablet (80 mg total) by mouth daily.   cholecalciferol (VITAMIN D) 1000 UNITS tablet Take 1,000 Units by  mouth daily.   Cyanocobalamin (VITAMIN B-12 PO) Take 1,000 mg by mouth daily.   DULoxetine (CYMBALTA) 60 MG capsule TAKE (1) CAPSULE BY MOUTH ONCE DAILY.   lisinopril (ZESTRIL) 2.5 MG tablet TAKE ONE TABLET BY MOUTH ONCE DAILY.   Omega-3 Fatty Acids (FISH OIL) 1000 MG CAPS Take 1,000 mg by mouth daily.   pantoprazole (PROTONIX) 40 MG tablet TAKE (1) TABLET BY MOUTH ONCE DAILY.   Probiotic Product (ALIGN PO) Take by mouth every morning.   tamsulosin (FLOMAX) 0.4 MG CAPS capsule Take 1 capsule (0.4 mg total) by mouth daily.   zonisamide (ZONEGRAN) 100 MG capsule Take 2 capsules (200 mg total) by mouth at bedtime.     Allergies:   Ciprofloxacin,  Oxycodone-acetaminophen, and Oxycodone-acetaminophen   Social History   Socioeconomic History   Marital status: Married    Spouse name: Not on file   Number of children: 3   Years of education: 12   Highest education level: Not on file  Occupational History   Occupation: Retired    Fish farm manager: RETIRED  Tobacco Use   Smoking status: Former   Smokeless tobacco: Former    Types: Chew    Quit date: 01/27/1973   Tobacco comments:    chewing   Vaping Use   Vaping Use: Never used  Substance and Sexual Activity   Alcohol use: No   Drug use: No   Sexual activity: Yes    Birth control/protection: None  Other Topics Concern   Not on file  Social History Narrative   Lives at home w/ his wife   Right-handed   Caffeine: 2 cups/day   Social Determinants of Health   Financial Resource Strain: Not on file  Food Insecurity: Not on file  Transportation Needs: Not on file  Physical Activity: Not on file  Stress: Not on file  Social Connections: Not on file     Family History: The patient's family history includes COPD in his father; Cancer in his father; Heart attack in his father; Heart attack (age of onset: 59) in his sister; Heart disease in his mother; Hypertension in his mother; Other (age of onset: 48) in his mother. There is no history of Seizures, Transient ischemic attack, or Stroke.  ROS:   Please see the history of present illness.    All other systems reviewed and are negative.  EKGs/Labs/Other Studies Reviewed:    The following studies were reviewed today: Echo 11/14/2021:  1. Left ventricular ejection fraction, by estimation, is 60 to 65%. The  left ventricle has normal function. The left ventricle has no regional  wall motion abnormalities. There is mild concentric left ventricular  hypertrophy. Left ventricular diastolic  function could not be evaluated.   2. Right ventricular systolic function is normal. The right ventricular  size is normal.   3. Left atrial  size was severely dilated.   4. Right atrial size was mildly dilated.   5. The mitral valve is normal in structure. Mild mitral valve  regurgitation. No evidence of mitral stenosis. Moderate mitral annular  calcification.   6. The aortic valve is normal in structure. Aortic valve regurgitation is  mild. Moderate aortic valve stenosis.   7. Aortic dilatation noted. There is mild dilatation of the ascending  aorta, measuring 45 mm.   8. The inferior vena cava is normal in size with greater than 50%  respiratory variability, suggesting right atrial pressure of 3 mmHg.   EKG:  EKG is ordered today.  The ekg ordered  today demonstrates atrial fibrillation 62 bpm, otherwise normal  Recent Labs: 06/13/2021: ALT 11; BUN 14; Creatinine, Ser 1.04; Hemoglobin 15.6; Platelets 209; Potassium 4.6; Sodium 139  Recent Lipid Panel    Component Value Date/Time   CHOL 137 06/13/2021 0806   TRIG 83 06/13/2021 0806   HDL 37 (L) 06/13/2021 0806   CHOLHDL 3.7 06/13/2021 0806   CHOLHDL 3.4 07/20/2015 0435   VLDL 19 07/20/2015 0435   LDLCALC 84 06/13/2021 0806     Risk Assessment/Calculations:    CHA2DS2-VASc Score = 5   This indicates a 7.2% annual risk of stroke. The patient's score is based upon: CHF History: 0 HTN History: 1 Diabetes History: 1 Stroke History: 0 Vascular Disease History: 1 Age Score: 2 Gender Score: 0               Physical Exam:    VS:  BP 118/80   Pulse 62   Ht '5\' 10"'$  (1.778 m)   Wt (!) 306 lb 6.4 oz (139 kg)   SpO2 99%   BMI 43.96 kg/m     Wt Readings from Last 3 Encounters:  12/02/21 (!) 306 lb 6.4 oz (139 kg)  06/26/21 (!) 313 lb 6.4 oz (142.2 kg)  12/28/20 (!) 318 lb (144.2 kg)     GEN:  Well nourished, well developed obese male in no acute distress HEENT: Normal NECK: No JVD; No carotid bruits LYMPHATICS: No lymphadenopathy CARDIAC: irregularly irregular, 2/6 harsh mid-peaking systolic murmur at the RUSB RESPIRATORY:  Clear to auscultation without  rales, wheezing or rhonchi  ABDOMEN: Soft, non-tender, non-distended MUSCULOSKELETAL:  trace bilateral pretibial edema bilaterally; No deformity  SKIN: Warm and dry NEUROLOGIC:  Alert and oriented x 3 PSYCHIATRIC:  Normal affect   ASSESSMENT:    1. Nonrheumatic aortic valve stenosis   2. Coronary artery disease involving native coronary artery of native heart without angina pectoris   3. Persistent atrial fibrillation (HCC)    PLAN:    In order of problems listed above:  The patient has moderate aortic stenosis.  I reviewed his recent echocardiogram and his mean transvalvular gradient is 19 mmHg.  He does not appear to have any symptoms related to aortic stenosis.  We will continue to follow his aortic valve stenosis with surveillance echocardiograms every few years.  He understands that he is on the low end of the moderate range of aortic stenosis. Stable without symptoms of angina.  Treated with a high intensity statin drug and an ACE inhibitor.  Lifestyle modification discussed. Heart rate remains well controlled.  Tolerating anticoagulation with apixaban.  No medication changes made today.           Medication Adjustments/Labs and Tests Ordered: Current medicines are reviewed at length with the patient today.  Concerns regarding medicines are outlined above.  Orders Placed This Encounter  Procedures   EKG 12-Lead   No orders of the defined types were placed in this encounter.   Patient Instructions  Medication Instructions:  Your physician recommends that you continue on your current medications as directed. Please refer to the Current Medication list given to you today.  *If you need a refill on your cardiac medications before your next appointment, please call your pharmacy*   Follow-Up: At [facility], you and your health needs are our priority.  As part of our continuing mission to provide you with exceptional heart care, we have created designated Provider Care  Teams.  These Care Teams include your primary Cardiologist (physician) and  Advanced Practice Providers (APPs -  Physician Assistants and Nurse Practitioners) who all work together to provide you with the care you need, when you need it.  We recommend signing up for the patient portal called "MyChart".  Sign up information is provided on this After Visit Summary.  MyChart is used to connect with patients for Virtual Visits (Telemedicine).  Patients are able to view lab/test results, encounter notes, upcoming appointments, etc.  Non-urgent messages can be sent to your provider as well.   To learn more about what you can do with MyChart, go to [email or web address].    Your next appointment:   1 year(s)  The format for your next appointment:   In Person  Provider:   Sherren Mocha, MD {          Signed, Sherren Mocha, MD  12/02/2021 1:28 PM    [place]

## 2021-12-20 ENCOUNTER — Telehealth: Payer: Self-pay | Admitting: Family Medicine

## 2021-12-20 DIAGNOSIS — I2510 Atherosclerotic heart disease of native coronary artery without angina pectoris: Secondary | ICD-10-CM

## 2021-12-20 DIAGNOSIS — E785 Hyperlipidemia, unspecified: Secondary | ICD-10-CM

## 2021-12-20 DIAGNOSIS — R7303 Prediabetes: Secondary | ICD-10-CM

## 2021-12-20 DIAGNOSIS — I10 Essential (primary) hypertension: Secondary | ICD-10-CM

## 2021-12-20 NOTE — Telephone Encounter (Signed)
Last labs completed 07/01/21-A1C, CBC, BMET, HEPATIC AND LIPID. Please advise. Thank you

## 2021-12-20 NOTE — Telephone Encounter (Signed)
Lipid, liver, metabolic 7, M1U Hyperlipidemia, HTN, coronary artery disease, prediabetes

## 2021-12-20 NOTE — Telephone Encounter (Signed)
Patient has appointment on 9/18 and wanting to know if he needs labs done. Please advise

## 2021-12-20 NOTE — Telephone Encounter (Signed)
Lab orders placed. Detailed message left on voicemail (ok per DPR)

## 2021-12-23 DIAGNOSIS — I10 Essential (primary) hypertension: Secondary | ICD-10-CM | POA: Diagnosis not present

## 2021-12-23 DIAGNOSIS — I2510 Atherosclerotic heart disease of native coronary artery without angina pectoris: Secondary | ICD-10-CM | POA: Diagnosis not present

## 2021-12-23 DIAGNOSIS — R7303 Prediabetes: Secondary | ICD-10-CM | POA: Diagnosis not present

## 2021-12-23 DIAGNOSIS — E785 Hyperlipidemia, unspecified: Secondary | ICD-10-CM | POA: Diagnosis not present

## 2021-12-24 LAB — HEMOGLOBIN A1C
Est. average glucose Bld gHb Est-mCnc: 128 mg/dL
Hgb A1c MFr Bld: 6.1 % — ABNORMAL HIGH (ref 4.8–5.6)

## 2021-12-24 LAB — BASIC METABOLIC PANEL
BUN/Creatinine Ratio: 21 (ref 10–24)
BUN: 23 mg/dL (ref 8–27)
CO2: 21 mmol/L (ref 20–29)
Calcium: 9.2 mg/dL (ref 8.6–10.2)
Chloride: 104 mmol/L (ref 96–106)
Creatinine, Ser: 1.09 mg/dL (ref 0.76–1.27)
Glucose: 107 mg/dL — ABNORMAL HIGH (ref 70–99)
Potassium: 4.5 mmol/L (ref 3.5–5.2)
Sodium: 139 mmol/L (ref 134–144)
eGFR: 69 mL/min/{1.73_m2} (ref >59)

## 2021-12-24 LAB — LIPID PANEL
Chol/HDL Ratio: 2.9 ratio (ref 0.0–5.0)
Cholesterol, Total: 115 mg/dL (ref 100–199)
HDL: 40 mg/dL (ref >39)
LDL Chol Calc (NIH): 64 mg/dL (ref 0–99)
Triglycerides: 43 mg/dL (ref 0–149)
VLDL Cholesterol Cal: 11 mg/dL (ref 5–40)

## 2021-12-24 LAB — HEPATIC FUNCTION PANEL
ALT: 11 IU/L (ref 0–44)
AST: 15 IU/L (ref 0–40)
Albumin: 4.1 g/dL (ref 3.8–4.8)
Alkaline Phosphatase: 75 IU/L (ref 44–121)
Bilirubin Total: 0.4 mg/dL (ref 0.0–1.2)
Bilirubin, Direct: 0.15 mg/dL (ref 0.00–0.40)
Total Protein: 6.2 g/dL (ref 6.0–8.5)

## 2021-12-26 ENCOUNTER — Other Ambulatory Visit: Payer: Self-pay | Admitting: Family Medicine

## 2021-12-26 ENCOUNTER — Other Ambulatory Visit: Payer: Self-pay | Admitting: Cardiovascular Disease

## 2021-12-26 DIAGNOSIS — I4819 Other persistent atrial fibrillation: Secondary | ICD-10-CM

## 2021-12-26 NOTE — Telephone Encounter (Signed)
Prescription refill request for Eliquis received. Indication: AF Last office visit: 12/02/21  Ezzie Dural MD Scr: 1.09 on 12/23/21 Age:  81 Weight:  139kg  Based on above findings Eliquis '5mg'$  twice daily is the appropriate dose.  Refill approved.

## 2021-12-30 ENCOUNTER — Ambulatory Visit (INDEPENDENT_AMBULATORY_CARE_PROVIDER_SITE_OTHER): Payer: PPO | Admitting: Family Medicine

## 2021-12-30 ENCOUNTER — Encounter: Payer: Self-pay | Admitting: Family Medicine

## 2021-12-30 VITALS — BP 116/74 | HR 81 | Temp 97.9°F | Ht 70.0 in | Wt 299.0 lb

## 2021-12-30 DIAGNOSIS — I4819 Other persistent atrial fibrillation: Secondary | ICD-10-CM

## 2021-12-30 DIAGNOSIS — R7303 Prediabetes: Secondary | ICD-10-CM | POA: Diagnosis not present

## 2021-12-30 DIAGNOSIS — G4733 Obstructive sleep apnea (adult) (pediatric): Secondary | ICD-10-CM | POA: Diagnosis not present

## 2021-12-30 DIAGNOSIS — Z23 Encounter for immunization: Secondary | ICD-10-CM

## 2021-12-30 DIAGNOSIS — I10 Essential (primary) hypertension: Secondary | ICD-10-CM | POA: Diagnosis not present

## 2021-12-30 DIAGNOSIS — Z0000 Encounter for general adult medical examination without abnormal findings: Secondary | ICD-10-CM | POA: Diagnosis not present

## 2021-12-30 DIAGNOSIS — E7800 Pure hypercholesterolemia, unspecified: Secondary | ICD-10-CM | POA: Diagnosis not present

## 2021-12-30 MED ORDER — LISINOPRIL 2.5 MG PO TABS: 2.5000 mg | ORAL_TABLET | Freq: Every day | ORAL | 3 refills | Status: DC

## 2021-12-30 MED ORDER — TAMSULOSIN HCL 0.4 MG PO CAPS: 0.4000 mg | ORAL_CAPSULE | Freq: Every day | ORAL | 3 refills | Status: DC

## 2021-12-30 MED ORDER — DULOXETINE HCL 60 MG PO CPEP: ORAL_CAPSULE | ORAL | 1 refills | Status: DC

## 2021-12-30 NOTE — Progress Notes (Signed)
   Subjective:    Patient ID: Steven [name], male    DOB: 05-02-1940, 81 y.o.   MRN: [id number]  HPI AWV- Annual Wellness Visit  The patient was seen for their annual wellness visit. The patient's past medical history, surgical history, and family history were reviewed. Pertinent vaccines were reviewed ( tetanus, pneumonia, shingles, flu) The patient's medication list was reviewed and updated.  The height and weight were entered.  BMI recorded in electronic record elsewhere  Cognitive screening was completed. Outcome of Mini - Cog: 5   Falls /depression screening electronically recorded within record elsewhere  Current tobacco usage:no (All patients who use tobacco were given written and verbal information on quitting)  Recent listing of emergency department/hospitalizations over the past year were reviewed.  current specialist the patient sees on a regular basis: cardiology , vision and dentist once a yr   Medicare annual wellness visit patient questionnaire was reviewed.  A written screening schedule for the patient for the next 5-10 years was given. Appropriate discussion of followup regarding next visit was discussed.  Here today for wellness He tries to eat relatively healthy and denies misuse of alcohol or tobacco.  States his moods are doing good.  Memory holding.  Passes mini cog Stays relatively active able to do basic house and yard activities.  Has not had any falls recently.  He does have underlying hyperlipidemia takes his medicine regular basis Has underlying blood pressure issues takes his medicine on a regular basis.  Patient does suffer with morbid obesity this has been a lifelong issue he does try to be somewhat careful with his eating habits and tries to stay physically active but it is difficult to lose weight at an advanced age Review of Systems     Objective:   Physical Exam General-in no acute distress Eyes-no discharge Lungs-respiratory rate normal,  CTA CV-no murmurs,RRR Extremities skin warm dry no edema Neuro grossly normal Behavior normal, alert        Assessment & Plan:  1. Encounter for subsequent annual wellness visit (AWV) in Medicare patient Adult wellness-complete.wellness physical was conducted today. Importance of diet and exercise were discussed in detail.  Importance of stress reduction and healthy living were discussed.  In addition to this a discussion regarding safety was also covered.  We also reviewed over immunizations and gave recommendations regarding current immunization needed for age.   In addition to this additional areas were also touched on including: Preventative health exams needed:  Colonoscopy not indicated  Patient was advised yearly wellness exam   2. Primary hypertension Blood pressure under decent control continue current measures  3. Persistent atrial fibrillation ([place]) Followed by cardiology blood thinner tolerating no bleeding issues  4. Obstructive sleep apnea He uses the mask intermittently I have encouraged him to use it on a regular basis  5. Hypercholesteremia Hyperlipidemia healthy diet recommended  6. Prediabetes A1c stable.  Healthy diet minimize starches  7. Morbid obesity ([place]) Portion control regular physical activity recommended  8. Flu vaccine need Flu vaccine today - Flu Vaccine QUAD High Dose(Fluad)  Physical exam Overall doing well with this No major setbacks. No need for prostate exam Does have BPH with frequency Safety discussed Patient not depressed patient encouraged to remain engaged with family and friends

## 2021-12-30 NOTE — Patient Instructions (Addendum)
Hi Don  Overall I feel you are doing well.  Please do your best at trying to eat healthy and stay physically active.  We will do your flu vaccine today.  You are up-to-date on your pneumonia vaccine. The new COVID booster will come out in early October it would be a good idea to do this.  That is at the pharmacy.  We have updated your medications.  Results for orders placed or performed in visit on 12/20/21  Lipid panel  Result Value Ref Range   Cholesterol, Total 115 100 - 199 mg/dL   Triglycerides 43 0 - 149 mg/dL   HDL 40 >39 mg/dL   VLDL Cholesterol Cal 11 5 - 40 mg/dL   LDL Chol Calc (NIH) 64 0 - 99 mg/dL   Chol/HDL Ratio 2.9 0.0 - 5.0 ratio  Hepatic function panel  Result Value Ref Range   Total Protein 6.2 6.0 - 8.5 g/dL   Albumin 4.1 3.8 - 4.8 g/dL   Bilirubin Total 0.4 0.0 - 1.2 mg/dL   Bilirubin, Direct 0.15 0.00 - 0.40 mg/dL   Alkaline Phosphatase 75 44 - 121 IU/L   AST 15 0 - 40 IU/L   ALT 11 0 - 44 IU/L  Basic metabolic panel  Result Value Ref Range   Glucose 107 (H) 70 - 99 mg/dL   BUN 23 8 - 27 mg/dL   Creatinine, Ser 1.09 0.76 - 1.27 mg/dL   eGFR 69 >59 mL/min/1.73   BUN/Creatinine Ratio 21 10 - 24   Sodium 139 134 - 144 mmol/L   Potassium 4.5 3.5 - 5.2 mmol/L   Chloride 104 96 - 106 mmol/L   CO2 21 20 - 29 mmol/L   Calcium 9.2 8.6 - 10.2 mg/dL  Hemoglobin A1c  Result Value Ref Range   Hgb A1c MFr Bld 6.1 (H) 4.8 - 5.6 %   Est. average glucose Bld gHb Est-mCnc 128 mg/dL   A1c does show prediabetes but it is stable.  No medications are needed.  Your kidney functions overall look good.  Electrolytes look good.  Your liver function and  cholesterol look very good.  As for reflux-Protonix-pantoprazole helps reduce acid in the stomach which cuts down on heartburn with reflux.  Many people as they get older do not need this medicine.  It would be fine for you to try to taper off of this medicine over the next month and if you do not have any heartburn issues  completely stop the medication.  Should you have any questions or problems let me know.  Please follow-up in the spring time.  Be safe and what you do.  TakeCare-Dr. Nicki Reaper

## 2022-01-03 ENCOUNTER — Telehealth: Payer: Self-pay

## 2022-01-03 NOTE — Telephone Encounter (Signed)
**Note De-Identified Abdulah Iqbal Obfuscation** A [employer] providers page for Eliquis assistance was received Nakeeta Sebastiani fax from Anthony Sar with [employer] [phone] and Fax-[phone]) with a request for [place] to complete the page, have Dr Burt Knack sign it, and to then fax it to [facility].  I have completed the providers page and have e-mailed it to Dr Antionette Char nurse so she can obtain his signature, date it, and to fax to [facility] at the fax number written on the cover page included.

## 2022-01-08 NOTE — Telephone Encounter (Signed)
Done and faxed today.

## 2022-01-17 NOTE — Telephone Encounter (Signed)
**Note De-Identified Cythia Bachtel Obfuscation** Another providers page of a BMS[employer] application was received Delos Klich fax from Anthony Sar with [employer] [phone] and Fax-[phone]) with a request for [place] to complete the page, have Dr Burt Knack sign it, and to then fax it to [facility].  We faxed this to [facility] on 9/27 so I called [employer] and was advised by [facility] that they did not receive it.  I am forwarding this message to Dr York Cerise nurse so she can re-fax the page to [facility] if she still has it and just in case she does not, I am re-emailing her the providers page so she can obtain Dr York Cerise signature on Monday 10/9 when he is back in the office and to then fax to [employer].

## 2022-01-21 ENCOUNTER — Other Ambulatory Visit: Payer: Self-pay | Admitting: Neurology

## 2022-01-22 ENCOUNTER — Telehealth: Payer: Self-pay | Admitting: Neurology

## 2022-01-22 NOTE — Telephone Encounter (Signed)
Called wife back (checked DPR )  Informed wife that I called Pharmacy Zonisamide will be ready for pick up today. Wife also wanted to know about next appointment . Wife states patient is doing great . Informed wife she can make the appointment for a 1year,but sooner if needed Wife expressed understanding and thanked me for calling

## 2022-01-22 NOTE — Telephone Encounter (Signed)
Pt wife is calling. Stated she wanted a refill on zonisamide (ZONEGRAN) 100 MG capsule. I informed her a years worth was requested on 08/27/2021. And she said she never got that. Stated she would like to talk to a nurse about how to get this refill.

## 2022-01-23 NOTE — Telephone Encounter (Signed)
**Note De-Identified Ahmarion Saraceno Obfuscation** Letter received Steven [name] fax from [facility] stating that they have approved the pt for Eliquis assistance until 04/13/2022. [id number]  The letter states that they have notified the pt of this approval as well.

## 2022-04-10 ENCOUNTER — Ambulatory Visit
Admission: EM | Admit: 2022-04-10 | Discharge: 2022-04-10 | Disposition: A | Discharge status: Home / Self Care | Payer: PPO | Attending: Nurse Practitioner | Admitting: Nurse Practitioner

## 2022-04-10 ENCOUNTER — Encounter: Payer: Self-pay | Admitting: Emergency Medicine

## 2022-04-10 DIAGNOSIS — Z1152 Encounter for screening for COVID-19: Secondary | ICD-10-CM | POA: Insufficient documentation

## 2022-04-10 DIAGNOSIS — R059 Cough, unspecified: Secondary | ICD-10-CM | POA: Insufficient documentation

## 2022-04-10 MED ORDER — ALBUTEROL SULFATE HFA 108 (90 BASE) MCG/ACT IN AERS: 2.0000 | INHALATION_SPRAY | Freq: Four times a day (QID) | RESPIRATORY_TRACT | 0 refills | Status: DC | PRN

## 2022-04-10 MED ORDER — AZITHROMYCIN 250 MG PO TABS: 250.0000 mg | ORAL_TABLET | Freq: Every day | ORAL | 0 refills | Status: DC

## 2022-04-10 NOTE — ED Triage Notes (Signed)
Cough x 2 days.  Fever yesterday.  Headache, body aches.  Has been taking tylenol and delsym.

## 2022-04-10 NOTE — Discharge Instructions (Addendum)
COVID test is pending. You will be contacted if the pending test results are positive. If your COVID test is positive, you are a candidate to receive molnupiravir. Take medication as prescribed. Increase fluids and allow for plenty of rest. May take over-the-counter Tylenol needed for pain, fever, or general discomfort. Recommend using a humidifier in your bedroom at nighttime during sleep and sleeping elevated on pillows while cough symptoms persist. Please follow-up in the emergency department if you experience shortness of breath, difficulty breathing, or become unable to speak in a complete sentence. Recommend following up with your primary care physician within the next 7 to 10 days for reevaluation. Follow-up as needed.

## 2022-04-10 NOTE — ED Provider Notes (Signed)
[facility]    CSN: [id number] Arrival date & time: 04/10/22  0806      History   Chief Complaint No chief complaint on file.   HPI Steven [name] is a 81 y.o. male.   The history is provided by the patient.   The patient presents for complaints of cough take that has been present for the past 2 days.  Patient states that cough has been productive.  He also endorses fever along with wheezing.  He states that he has been short of breath, but this is not new for him.  He denies body aches, ear pain, abdominal pain, nausea, vomiting, or diarrhea.  Patient also endorses a pain along his right shoulder blade that started with the cough.  Patient reports that he did go to his local pharmacy and started Delsym cough syrup.  He states that cough has somewhat improved.  He states cough is now productive.  Past Medical History:  Diagnosis Date   Chronic pain    Complication of anesthesia    pt woke up during last surgery    Coronary artery disease 02-27-11   Dr. Jacquiline Doe follows-has some blocked coronary arteries ,not suitable for stent  placement   Dyslipidemia    Dysrhythmia    hx of atrial fib during last hospitalization    GERD (gastroesophageal reflux disease) 02-27-11   Acid reflux   Hearing loss 02-27-11   Bilateral hearing aids-due to exposure to loud machinery   Hemorrhoids 02-27-11   not bothersome at this time   Hyperlipidemia    Hypertension    Leg swelling    Obesity    Osteoarthritis 02-27-11   Ostearthritis-knees, shoulders.Back causes chronic pain-radiates down right leg   Rash    Seizures ([place]) 07/2014   Shingles outbreak 02-27-11   2 weeks ago , was tx.-only residual is tenderness of right scalp-no open areas   Shortness of breath    with exertion    Urinary incontinence 02-27-11   not an everday occurrence-no special measures    Patient Active Problem List   Diagnosis Date Noted   Daytime somnolence 06/26/2021   Focal epilepsy  originating in frontal lobe ([place]) 02/27/2020   Transient alteration of awareness 12/22/2019   Complex partial seizure with impairment of consciousness at onset [facility]) 12/22/2019   Complex partial seizure evolving to generalized seizure ([place]) 08/13/2015   Seizure ([place]) 07/19/2015   Obstructive sleep apnea 10/07/2014   Morbid obesity ([place]) 05/23/2014   Prediabetes 01/30/2014   Chronic back pain 11/28/2013   Postop Acute blood loss anemia 03/15/2012   OA (osteoarthritis) of knee 03/01/2012   Hypokalemia 03/19/2011   Persistent atrial fibrillation ([place]) 03/17/2011   HTN (hypertension) 03/12/2011   Hypercholesteremia 03/12/2011   Chronic knee pain 03/12/2011   Postop Hyponatremia 03/12/2011   Acute cholecystitis 03/12/2011   Anemia 03/12/2011   CORONARY ATHEROSCLEROSIS NATIVE CORONARY ARTERY 03/20/2010   OTHER DYSPNEA AND RESPIRATORY ABNORMALITIES 02/08/2010   ABNORMAL ELECTROCARDIOGRAM 02/08/2010    Past Surgical History:  Procedure Laterality Date   CHOLECYSTECTOMY  03/14/2011   Procedure: LAPAROSCOPIC CHOLECYSTECTOMY;  Surgeon: Edward Jolly, MD;  Location: [facility];  Service: General;  Laterality: N/A;   COLONOSCOPY     INJECTION KNEE  03/05/2011   Procedure: KNEE INJECTION;  Surgeon: Gearlean Alf;  Location: [facility];  Service: Orthopedics;  Laterality: Left;  80 mg depomedrol   LAPAROSCOPY  03/16/2011   Procedure: LAPAROSCOPY DIAGNOSTIC;  Surgeon: Judieth Keens, DO;  Location: [facility];  Service: General;  Laterality: N/A;  repair incisional hernia   REPLACEMENT TOTAL KNEE  02-27-11   right- 2007   TOTAL KNEE ARTHROPLASTY  03/01/2012   Procedure: TOTAL KNEE ARTHROPLASTY;  Surgeon: Gearlean Alf, MD;  Location: [facility];  Service: Orthopedics;  Laterality: Left;   TOTAL KNEE REVISION  03/05/2011   Procedure: TOTAL KNEE REVISION;  Surgeon: Dione Plover Aluisio;  Location: [facility];  Service: Orthopedics;  Laterality: Right;       Home Medications    Prior to Admission  medications   Medication Sig Start Date End Date Taking? Authorizing Provider  albuterol (VENTOLIN HFA) 108 (90 Base) MCG/ACT inhaler Inhale 2 puffs into the lungs every 6 (six) hours as needed for wheezing or shortness of breath. 04/10/22  Yes Davaris Youtsey-Warren, Alda Lea, NP  azithromycin (ZITHROMAX) 250 MG tablet Take 1 tablet (250 mg total) by mouth daily. Take first 2 tablets together, then 1 every day until finished. 04/10/22  Yes Sora Olivo-Warren, Alda Lea, NP  apixaban (ELIQUIS) 5 MG TABS tablet TAKE (1) TABLET BY MOUTH TWICE DAILY. 12/26/21   Sherren Mocha, MD  atorvastatin (LIPITOR) 80 MG tablet Take 1 tablet (80 mg total) by mouth daily. 06/26/21   Kathyrn Drown, MD  cholecalciferol (VITAMIN D) 1000 UNITS tablet Take 1,000 Units by mouth daily.    [provider]  Cyanocobalamin (VITAMIN B-12 PO) Take 1,000 mg by mouth daily.    [provider]  DULoxetine (CYMBALTA) 60 MG capsule 1 qd 12/30/21   Kathyrn Drown, MD  lisinopril (ZESTRIL) 2.5 MG tablet Take 1 tablet (2.5 mg total) by mouth daily. 12/30/21   Kathyrn Drown, MD  Omega-3 Fatty Acids (FISH OIL) 1000 MG CAPS Take 1,000 mg by mouth daily.    [provider]  pantoprazole (PROTONIX) 40 MG tablet TAKE (1) TABLET BY MOUTH ONCE DAILY. 10/29/21   Kathyrn Drown, MD  Probiotic Product (ALIGN PO) Take by mouth every morning.    [provider]  tamsulosin (FLOMAX) 0.4 MG CAPS capsule Take 1 capsule (0.4 mg total) by mouth daily. 12/30/21   Kathyrn Drown, MD  zonisamide (ZONEGRAN) 100 MG capsule Take 2 capsules (200 mg total) by mouth at bedtime. 08/27/21   Melvenia Beam, MD    Family History Family History  Problem Relation Age of Onset   Heart attack Father        mi I N HIS 11'S BUT LIVED INTO HIS 67'S WITH COPD   COPD Father    Cancer Father        colon   Other Mother 48       died multiple med problems   Heart disease Mother    Hypertension Mother    Heart attack Sister 65   Seizures  Neg Hx    Transient ischemic attack Neg Hx    Stroke Neg Hx     Social History Social History   Tobacco Use   Smoking status: Former   Smokeless tobacco: Former    Types: Chew    Quit date: 01/27/1973   Tobacco comments:    chewing   Vaping Use   Vaping Use: Never used  Substance Use Topics   Alcohol use: No   Drug use: No     Allergies   Ciprofloxacin, Oxycodone-acetaminophen, and Oxycodone-acetaminophen   Review of Systems Review of Systems Per HPI  Physical Exam Triage Vital Signs ED Triage Vitals [04/10/22 0820]  Enc Vitals Group  BP 122/79     Pulse Rate 89     Resp 18     Temp 98.5 F (36.9 C)     Temp Source Oral     SpO2 95 %     Weight      Height      Head Circumference      Peak Flow      Pain Score 6     Pain Loc      Pain Edu?      Excl. in [place]?    No data found.  Updated Vital Signs BP 122/79 (BP Location: Right Arm)   Pulse 89   Temp 98.5 F (36.9 C) (Oral)   Resp 18   SpO2 95%   Visual Acuity Right Eye Distance:   Left Eye Distance:   Bilateral Distance:    Right Eye Near:   Left Eye Near:    Bilateral Near:     Physical Exam Vitals and nursing note reviewed.  Constitutional:      General: He is not in acute distress.    Appearance: Normal appearance.  HENT:     Head: Normocephalic.     Right Ear: Tympanic membrane, ear canal and external ear normal.     Left Ear: Tympanic membrane, ear canal and external ear normal.     Nose: Rhinorrhea present.     Mouth/Throat:     Mouth: Mucous membranes are moist.     Pharynx: No posterior oropharyngeal erythema.  Eyes:     Extraocular Movements: Extraocular movements intact.     Conjunctiva/sclera: Conjunctivae normal.     Pupils: Pupils are equal, round, and reactive to light.  Cardiovascular:     Rate and Rhythm: Normal rate and regular rhythm.     Pulses: Normal pulses.     Heart sounds: Normal heart sounds.  Pulmonary:     Effort: Pulmonary effort is normal.      Breath sounds: Rales (LLL) present.  Abdominal:     General: Bowel sounds are normal.     Palpations: Abdomen is soft.     Tenderness: There is no abdominal tenderness.  Musculoskeletal:     Cervical back: Normal range of motion.  Lymphadenopathy:     Cervical: No cervical adenopathy.  Skin:    General: Skin is warm and dry.  Neurological:     General: No focal deficit present.     Mental Status: He is alert and oriented to person, place, and time.  Psychiatric:        Mood and Affect: Mood normal.        Behavior: Behavior normal.      UC Treatments / Results  Labs (all labs ordered are listed, but only abnormal results are displayed) Labs Reviewed  SARS CORONAVIRUS 2 (TAT 6-24 HRS)    EKG   Radiology No results found.  Procedures Procedures (including critical care time)  Medications Ordered in UC Medications - No data to display  Initial Impression / Assessment and Plan / UC Course  I have reviewed the triage vital signs and the nursing notes.  Pertinent labs & imaging results that were available during my care of the patient were reviewed by me and considered in my medical decision making (see chart for details).  The patient is well-appearing, he is in no acute distress, vital signs are stable.  Suspect a left lower lobe pneumonia given that he has crackles noted on his exam.  Unable to perform chest x-ray  due to staff availability.  Will treat patient empirically with azithromycin 250 mg for pneumonia.  COVID test is pending.  He is a candidate to receive the peer reviewer if his COVID test is positive.  Will also treat patient with an albuterol inhaler for his wheezing and shortness of breath.  Patient will continue Delsym at this time.  Supportive care recommendations were provided to the patient to include increasing fluids, allowing for plenty of rest, and continuing Tylenol for pain, fever, general discomfort.  Advised patient that I would recommend that he  follow-up with his primary care physician within the next 7 to 10 days for reevaluation.  Also discussed indications of when patient should follow-up in the emergency department.  Patient and spouse verbalized understanding.  All questions were answered.  Patient stable for discharge.  Final Clinical Impressions(s) / UC Diagnoses   Final diagnoses:  Encounter for screening for COVID-19  Cough, unspecified type     Discharge Instructions      COVID test is pending. You will be contacted if the pending test results are positive. If your COVID test is positive, you are a candidate to receive molnupiravir. Take medication as prescribed. Increase fluids and allow for plenty of rest. May take over-the-counter Tylenol needed for pain, fever, or general discomfort. Recommend using a humidifier in your bedroom at nighttime during sleep and sleeping elevated on pillows while cough symptoms persist. Please follow-up in the emergency department if you experience shortness of breath, difficulty breathing, or become unable to speak in a complete sentence. Recommend following up with your primary care physician within the next 7 to 10 days for reevaluation. Follow-up as needed.     ED Prescriptions     Medication Sig Dispense Auth. Provider   azithromycin (ZITHROMAX) 250 MG tablet Take 1 tablet (250 mg total) by mouth daily. Take first 2 tablets together, then 1 every day until finished. 6 tablet Bostyn Bogie-Warren, Alda Lea, NP   albuterol (VENTOLIN HFA) 108 (90 Base) MCG/ACT inhaler Inhale 2 puffs into the lungs every 6 (six) hours as needed for wheezing or shortness of breath. 8 g Dailey Alberson-Warren, Alda Lea, NP      PDMP not reviewed this encounter.   Tish Men, NP 04/10/22 [phone]

## 2022-04-11 ENCOUNTER — Telehealth: Payer: Self-pay | Admitting: Family Medicine

## 2022-04-11 ENCOUNTER — Telehealth (HOSPITAL_COMMUNITY): Payer: Self-pay | Admitting: Emergency Medicine

## 2022-04-11 LAB — SARS CORONAVIRUS 2 (TAT 6-24 HRS): SARS Coronavirus 2: POSITIVE — AB

## 2022-04-11 MED ORDER — MOLNUPIRAVIR EUA 200MG CAPSULE: 4.0000 | ORAL_CAPSULE | Freq: Two times a day (BID) | ORAL | 0 refills | Status: AC

## 2022-04-11 MED ORDER — MOLNUPIRAVIR EUA 200MG CAPSULE: 4.0000 | ORAL_CAPSULE | Freq: Two times a day (BID) | ORAL | 0 refills | Status: DC

## 2022-04-11 NOTE — Telephone Encounter (Signed)
Molnupiravir printed as pharmacy that was sent to was out

## 2022-04-29 ENCOUNTER — Other Ambulatory Visit: Payer: Self-pay | Admitting: Family Medicine

## 2022-06-09 ENCOUNTER — Telehealth: Payer: Self-pay

## 2022-06-09 DIAGNOSIS — Z79899 Other long term (current) drug therapy: Secondary | ICD-10-CM

## 2022-06-09 DIAGNOSIS — I10 Essential (primary) hypertension: Secondary | ICD-10-CM

## 2022-06-09 DIAGNOSIS — E785 Hyperlipidemia, unspecified: Secondary | ICD-10-CM

## 2022-06-09 DIAGNOSIS — R7303 Prediabetes: Secondary | ICD-10-CM

## 2022-06-09 NOTE — Telephone Encounter (Signed)
Last labs 12/2021: Lipid, Liver , Met 7 HgbA1c

## 2022-06-09 NOTE — Telephone Encounter (Signed)
Blood work ordered in EPIC.    Left message to return call 

## 2022-06-09 NOTE — Telephone Encounter (Signed)
A1c, urine ACR(due to HTN), CMP with estimated GFR, lipid HTN, prediabetes, hyperlipidemia

## 2022-06-09 NOTE — Telephone Encounter (Signed)
Pt called left a message wanted to know if he needed blood work before his appt on 03/18  Leibish call back [phone]

## 2022-06-10 ENCOUNTER — Telehealth: Payer: Self-pay

## 2022-06-10 NOTE — Telephone Encounter (Signed)
Patient's DPR Pat called and notified that labs were ordered per Dr Nicki Reaper. Patient's DPR verbalized understanding and would make patient aware.

## 2022-06-10 NOTE — Telephone Encounter (Signed)
Patient aware.

## 2022-06-23 DIAGNOSIS — Z79899 Other long term (current) drug therapy: Secondary | ICD-10-CM | POA: Diagnosis not present

## 2022-06-23 DIAGNOSIS — E785 Hyperlipidemia, unspecified: Secondary | ICD-10-CM | POA: Diagnosis not present

## 2022-06-23 DIAGNOSIS — R7303 Prediabetes: Secondary | ICD-10-CM | POA: Diagnosis not present

## 2022-06-23 DIAGNOSIS — I10 Essential (primary) hypertension: Secondary | ICD-10-CM | POA: Diagnosis not present

## 2022-06-24 LAB — CMP14+EGFR
ALT: 13 IU/L (ref 0–44)
AST: 17 IU/L (ref 0–40)
Albumin/Globulin Ratio: 2 (ref 1.2–2.2)
Albumin: 4.3 g/dL (ref 3.8–4.8)
Alkaline Phosphatase: 69 IU/L (ref 44–121)
BUN/Creatinine Ratio: 20 (ref 10–24)
BUN: 21 mg/dL (ref 8–27)
Bilirubin Total: 0.5 mg/dL (ref 0.0–1.2)
CO2: 21 mmol/L (ref 20–29)
Calcium: 9 mg/dL (ref 8.6–10.2)
Chloride: 102 mmol/L (ref 96–106)
Creatinine, Ser: 1.06 mg/dL (ref 0.76–1.27)
Globulin, Total: 2.1 g/dL (ref 1.5–4.5)
Glucose: 117 mg/dL — ABNORMAL HIGH (ref 70–99)
Potassium: 4.4 mmol/L (ref 3.5–5.2)
Sodium: 139 mmol/L (ref 134–144)
Total Protein: 6.4 g/dL (ref 6.0–8.5)
eGFR: 71 mL/min/{1.73_m2} (ref >59)

## 2022-06-24 LAB — LIPID PANEL
Chol/HDL Ratio: 2.7 ratio (ref 0.0–5.0)
Cholesterol, Total: 125 mg/dL (ref 100–199)
HDL: 47 mg/dL (ref >39)
LDL Chol Calc (NIH): 64 mg/dL (ref 0–99)
Triglycerides: 65 mg/dL (ref 0–149)
VLDL Cholesterol Cal: 14 mg/dL (ref 5–40)

## 2022-06-24 LAB — HEMOGLOBIN A1C
Est. average glucose Bld gHb Est-mCnc: 126 mg/dL
Hgb A1c MFr Bld: 6 % — ABNORMAL HIGH (ref 4.8–5.6)

## 2022-06-24 LAB — MICROALBUMIN / CREATININE URINE RATIO
Creatinine, Urine: 155.1 mg/dL
Microalb/Creat Ratio: 4 mg/g creat (ref 0–29)
Microalbumin, Urine: 6.2 ug/mL

## 2022-06-30 ENCOUNTER — Ambulatory Visit (INDEPENDENT_AMBULATORY_CARE_PROVIDER_SITE_OTHER): Payer: PPO | Admitting: Family Medicine

## 2022-06-30 VITALS — BP 122/84 | Ht 70.0 in | Wt 309.6 lb

## 2022-06-30 DIAGNOSIS — G40109 Localization-related (focal) (partial) symptomatic epilepsy and epileptic syndromes with simple partial seizures, not intractable, without status epilepticus: Secondary | ICD-10-CM | POA: Diagnosis not present

## 2022-06-30 DIAGNOSIS — E7800 Pure hypercholesterolemia, unspecified: Secondary | ICD-10-CM | POA: Diagnosis not present

## 2022-06-30 DIAGNOSIS — I10 Essential (primary) hypertension: Secondary | ICD-10-CM

## 2022-06-30 DIAGNOSIS — I4819 Other persistent atrial fibrillation: Secondary | ICD-10-CM | POA: Diagnosis not present

## 2022-06-30 DIAGNOSIS — G4733 Obstructive sleep apnea (adult) (pediatric): Secondary | ICD-10-CM

## 2022-06-30 MED ORDER — DULOXETINE HCL 60 MG PO CPEP: ORAL_CAPSULE | ORAL | 1 refills | Status: DC

## 2022-06-30 MED ORDER — LISINOPRIL 2.5 MG PO TABS: 2.5000 mg | ORAL_TABLET | Freq: Every day | ORAL | 1 refills | Status: DC

## 2022-06-30 MED ORDER — ATORVASTATIN CALCIUM 80 MG PO TABS: 80.0000 mg | ORAL_TABLET | Freq: Every day | ORAL | 3 refills | Status: DC

## 2022-06-30 MED ORDER — PANTOPRAZOLE SODIUM 40 MG PO TBEC: DELAYED_RELEASE_TABLET | ORAL | 1 refills | Status: DC

## 2022-06-30 MED ORDER — APIXABAN 5 MG PO TABS: ORAL_TABLET | ORAL | 5 refills | Status: DC

## 2022-06-30 NOTE — Progress Notes (Deleted)
We are  

## 2022-06-30 NOTE — Progress Notes (Signed)
   Subjective:    Patient ID: Steven [name], male    DOB: 23-Jan-1941, 82 y.o.   MRN: [id number]  Hypertension This is a chronic problem. The current episode started more than 1 year ago. Risk factors for coronary artery disease include dyslipidemia and male gender. Treatments tried: lisinopril. There are no compliance problems.    Patient states he sleeps a whole lot He does relate he snores at night but he no longer uses his sleep apnea Machine, he relates some daytime somnolence but never with driving Denies any seizure issues currently takes his medicine regular basis   Does have diarrhea issues some days worse than other days.  Watery.  Had his gallbladder out.  Not interested in taking additional medicines Has BPH takes his tamsulosin daily states this does help him Review of Systems     Objective:   Physical Exam  General-in no acute distress Eyes-no discharge Lungs-respiratory rate normal, CTA CV-no murmurs,RRR Extremities skin warm dry no edema Neuro grossly normal Behavior normal, alert  We did review over the labs and the urine overall this looks good     Assessment & Plan:  1. Persistent atrial fibrillation (HCC) Continue Eliquis no sign of bleeding issues - apixaban (ELIQUIS) 5 MG TABS tablet; TAKE (1) TABLET BY MOUTH TWICE DAILY.  Dispense: 60 tablet; Refill: 5  2. Primary hypertension HTN- patient seen for follow-up regarding HTN.   Diet, medication compliance, appropriate labs and refills were completed.   Importance of keeping blood pressure under good control to lessen the risk of complications discussed Regular follow-up visits discussed   3. Hypercholesteremia Hyperlipidemia-importance of diet, weight control, activity, compliance with medications discussed.   Recent labs reviewed.   Any additional labs or refills ordered.   Importance of keeping under good control discussed. Regular follow-up visits discussed, lab work look good   4. Morbid  obesity ([place]) Portion control regular physical activity  5. Focal epilepsy ([place]) Continue seizure medications, no seizures currently  6. Obstructive sleep apnea CPAP to be considered by the patient and he would need to do a new study he will let us know  7. Essential hypertension See above, lab work look good  Loose stools probably related to gallbladder being removed patient to consider cholestyramine but currently does not want to utilize this  About 6 months

## 2022-06-30 NOTE — Patient Instructions (Signed)
We will do your follow-up wellness in September  Recommend to do labs before that visit  Should you decide that you want to do the sleep study please let me know and we will order it  Please take care-Dr. Sallee Lange

## 2022-07-23 ENCOUNTER — Other Ambulatory Visit: Payer: Self-pay | Admitting: Neurology

## 2022-08-20 ENCOUNTER — Ambulatory Visit: Payer: PPO | Admitting: Neurology

## 2022-08-20 ENCOUNTER — Encounter: Payer: Self-pay | Admitting: Neurology

## 2022-08-20 VITALS — BP 145/94 | HR 68 | Ht 70.5 in | Wt 305.0 lb

## 2022-08-20 DIAGNOSIS — G40109 Localization-related (focal) (partial) symptomatic epilepsy and epileptic syndromes with simple partial seizures, not intractable, without status epilepticus: Secondary | ICD-10-CM | POA: Diagnosis not present

## 2022-08-20 MED ORDER — ZONISAMIDE 100 MG PO CAPS: ORAL_CAPSULE | ORAL | 4 refills | Status: DC

## 2022-08-20 NOTE — Progress Notes (Signed)
[facility]    Provider:  Dr Lucia Gaskins Requesting Provider: Babs Sciara, MD Primary Care Provider:  Babs Sciara, MD  CC:  epilepsy  08/20/2022: He is doing well. No problems. No seizures. No side effects. Very stable on zonisamide 200mg  at bedtime. Last eeg was 2021 did not show seizures (but did show that he has epileptiform activity so will stay on zonisamide for life). he has a history of seizures since April 2017, he could not tolerate Keppra, Vimpat was too expensive, he is doing well on zonisamide at this time.  Patient complains of symptoms per HPI as well as the following symptoms: none . Pertinent negatives and positives per HPI. All others negative   08/27/2021: He is doing well, no problems, no  seizures, no side effects, on 200mg  zonisamide at bedtime, keep him on it for life, since stable and doing so well, has not had an episode for years, will ask Dr. Gerda Diss to refill and send back to primary care.  I reviewed his history, he was diagnosed with focal epilepsy last seen in November 2021, EEG was abnormal with intermittent left frontal temporal sharp transients consistent with potentially an epileptiform activity, he has a history of seizures since April 2017, he could not tolerate Keppra, Vimpat was too expensive, he is doing well on zonisamide at this time.  Patient complains of symptoms per HPI as well as the following symptoms: none . Pertinent negatives and positives per HPI. All others negative   Interval history 02/27/2020: EEG 72-hour ambulatory January 31, 2020 to February 03, 2020 was abnormal: There were intermittent left frontal temporal sharp transients seen and noted, findings consistent with left frontal/anterior temporal sharp wave activity, potentially epileptiform in nature which places this patient at an increased risk for focal epilepsy.  I discussed with the patient and wife today, suggested antiepilepsy medications for life.  If he has any  more episodes we can increase his zonisamide to 300 mg a day.  They understood.  We also discussed driving, he has not had a seizure for 6 months I would prefer if he waited till after the holidays he can drive in January.    HPI:  Steven [name] is a 82 y.o. male here as requested by Babs Sciara, MD for speech disturbance. PMHx obesity, hypertension, diabetes, chronic low back pain, seizure disorder for which he has been seen in the past, he has a history of seizure of April 2017, he could not tolerate Keppra, repeat EEG was normal in the past, he stopped his Keppra and declined switching to another seizure medication.  From a review of notes he also discussed fatigue which may be due to multiple reasons including polypharmacy, obesity, lifestyle and depression.  The seizure was in 2016/2017, wife walked in and he was jerking all four limbs, drooling on the sides of the mouth, eyes closed, he became combative and "out of it", since he stopped Keppra last time I saw him he had not had a repeat event.  Seizure was unprovoked.  I reviewed Dr. Fletcher Anon notes concerning possible TIA.  Patient had a spell where he could not speak lasted less than a few seconds, the spells occur he will seem out of it but awake and unable to respond to her and will take several moments before is able to make any sense, patient denied any unilateral numbness weakness, any headaches, he has been worked up in the past with a normal EEG and is also had  an MRI MRA  4 years ago due to TIAs.  Dr. Gerda Diss  e ordered MRI of the brain with and without contrast, MR angio of the head without contrast and ultrasound carotid bilaterally.  Wife gives most information, back in November he had "spells" he was not there, confused, wasn;t understanding, just a few seconds and then he would be ok. They would happen during the day, he had  Few of them unknown how many, he saw his cardiologist in December and told him about it and he was on a heart  monitor for a while. He is compliant with his Eliquis. In March he had several more, he doesn't pass out, he doesn't lose consciousness, one time he wa at the kitchen table and she asked him multiple questions and she kept asking him the same question and when he answered it didn't make sense. The most recent was in August, he did not remember it, lasted a minute or so, afterwards it is just normal. He has sleep apnea and compliant.   Reviewed notes, labs and imaging from outside physicians, which showed:  MRI brain/MRA head 10/15/2019: personally reviewed and agree with the following  IMPRESSION: 1. No acute intracranial abnormality. 2. Mild chronic microvascular ischemic disease for age, mildly progressed relative to 2017. 3. Stable negative intracranial MRA.  US carotids 10/06/2019: Color duplex indicates minimal heterogeneous plaque, with no hemodynamically significant stenosis by duplex criteria in the extracranial cerebrovascular circulation.  Review of Systems: Patient complains of symptoms per HPI as well as the following symptoms: alteration of awareness. Pertinent negatives and positives per HPI. All others negative.   Social History   Socioeconomic History   Marital status: Married    Spouse name: Not on file   Number of children: 3   Years of education: 12   Highest education level: Not on file  Occupational History   Occupation: Retired    Associate Professor: RETIRED  Tobacco Use   Smoking status: Former   Smokeless tobacco: Former    Types: Chew    Quit date: 01/27/1973   Tobacco comments:    chewing   Vaping Use   Vaping Use: Never used  Substance and Sexual Activity   Alcohol use: No   Drug use: No   Sexual activity: Yes    Birth control/protection: None  Other Topics Concern   Not on file  Social History Narrative   Lives at home w/ his wife   Right-handed   Caffeine: 2 cups/day   Social Determinants of Health   Financial Resource Strain: Not on file  Food  Insecurity: Not on file  Transportation Needs: Not on file  Physical Activity: Not on file  Stress: Not on file  Social Connections: Not on file  Intimate Partner Violence: Not on file    Family History  Problem Relation Age of Onset   Heart attack Father        mi I N HIS 40'S BUT LIVED INTO HIS 90'S WITH COPD   COPD Father    Cancer Father        colon   Other Mother 58       died multiple med problems   Heart disease Mother    Hypertension Mother    Heart attack Sister 18   Seizures Neg Hx    Transient ischemic attack Neg Hx    Stroke Neg Hx     Past Medical History:  Diagnosis Date   Chronic pain    Complication of  anesthesia    pt woke up during last surgery    Coronary artery disease 02-27-11   Dr. Marliss Coots follows-has some blocked coronary arteries ,not suitable for stent  placement   Dyslipidemia    Dysrhythmia    hx of atrial fib during last hospitalization    GERD (gastroesophageal reflux disease) 02-27-11   Acid reflux   Hearing loss 02-27-11   Bilateral hearing aids-due to exposure to loud machinery   Hemorrhoids 02-27-11   not bothersome at this time   Hyperlipidemia    Hypertension    Leg swelling    Obesity    Osteoarthritis 02-27-11   Ostearthritis-knees, shoulders.Back causes chronic pain-radiates down right leg   Rash    Seizures (HCC) 07/2014   Shingles outbreak 02-27-11   2 weeks ago , was tx.-only residual is tenderness of right scalp-no open areas   Shortness of breath    with exertion    Urinary incontinence 02-27-11   not an everday occurrence-no special measures    Patient Active Problem List   Diagnosis Date Noted   Daytime somnolence 06/26/2021   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) 02/27/2020   Transient alteration of awareness 12/22/2019   Complex partial seizure with impairment of consciousness at onset [facility]) 12/22/2019   Complex partial seizure evolving to generalized seizure (HCC) 08/13/2015    Seizure (HCC) 07/19/2015   Obstructive sleep apnea 10/07/2014   Morbid obesity (HCC) 05/23/2014   Prediabetes 01/30/2014   Chronic back pain 11/28/2013   Postop Acute blood loss anemia 03/15/2012   OA (osteoarthritis) of knee 03/01/2012   Hypokalemia 03/19/2011   Persistent atrial fibrillation (HCC) 03/17/2011   HTN (hypertension) 03/12/2011   Hypercholesteremia 03/12/2011   Chronic knee pain 03/12/2011   Postop Hyponatremia 03/12/2011   Acute cholecystitis 03/12/2011   Anemia 03/12/2011   CORONARY ATHEROSCLEROSIS NATIVE CORONARY ARTERY 03/20/2010   OTHER DYSPNEA AND RESPIRATORY ABNORMALITIES 02/08/2010   ABNORMAL ELECTROCARDIOGRAM 02/08/2010    Past Surgical History:  Procedure Laterality Date   CHOLECYSTECTOMY  03/14/2011   Procedure: LAPAROSCOPIC CHOLECYSTECTOMY;  Surgeon: Mariella Saa, MD;  Location: [facility];  Service: General;  Laterality: N/A;   COLONOSCOPY     INJECTION KNEE  03/05/2011   Procedure: KNEE INJECTION;  Surgeon: Loanne Drilling;  Location: [facility];  Service: Orthopedics;  Laterality: Left;  80 mg depomedrol   LAPAROSCOPY  03/16/2011   Procedure: LAPAROSCOPY DIAGNOSTIC;  Surgeon: Rulon Abide, DO;  Location: [facility];  Service: General;  Laterality: N/A;  repair incisional hernia   REPLACEMENT TOTAL KNEE  02-27-11   right- 2007   TOTAL KNEE ARTHROPLASTY  03/01/2012   Procedure: TOTAL KNEE ARTHROPLASTY;  Surgeon: Loanne Drilling, MD;  Location: [facility];  Service: Orthopedics;  Laterality: Left;   TOTAL KNEE REVISION  03/05/2011   Procedure: TOTAL KNEE REVISION;  Surgeon: Gus Rankin Aluisio;  Location: [facility];  Service: Orthopedics;  Laterality: Right;    Current Outpatient Medications  Medication Sig Dispense Refill   albuterol (VENTOLIN HFA) 108 (90 Base) MCG/ACT inhaler Inhale 2 puffs into the lungs every 6 (six) hours as needed for wheezing or shortness of breath. 8 g 0   apixaban (ELIQUIS) 5 MG TABS tablet TAKE (1) TABLET BY MOUTH TWICE DAILY. 60  tablet 5   atorvastatin (LIPITOR) 80 MG tablet Take 1 tablet (80 mg total) by mouth daily. 90 tablet 3   cholecalciferol (VITAMIN D) 1000 UNITS tablet Take 1,000 Units by mouth daily.  Cyanocobalamin (VITAMIN B-12 PO) Take 1,000 mg by mouth daily.     DULoxetine (CYMBALTA) 60 MG capsule 1 qd 90 capsule 1   lisinopril (ZESTRIL) 2.5 MG tablet Take 1 tablet (2.5 mg total) by mouth daily. 90 tablet 1   Omega-3 Fatty Acids (FISH OIL) 1000 MG CAPS Take 1,000 mg by mouth daily.     pantoprazole (PROTONIX) 40 MG tablet 1 qd 90 tablet 1   Probiotic Product (ALIGN PO) Take by mouth every morning.     tamsulosin (FLOMAX) 0.4 MG CAPS capsule Take 1 capsule (0.4 mg total) by mouth daily. 90 capsule 3   zonisamide (ZONEGRAN) 100 MG capsule TAKE (2) CAPSULES BY MOUTH ONCE DAILY. 180 capsule 4   No current facility-administered medications for this visit.    Allergies as of 08/20/2022 - Review Complete 08/20/2022  Allergen Reaction Noted   Ciprofloxacin Rash 04/04/2011   Oxycodone-acetaminophen Rash and Other (See Comments) 02/08/2010   Oxycodone-acetaminophen Rash 03/28/2019    Vitals: BP (!) 145/94 (BP Location: Right Arm, Patient Position: Sitting, Cuff Size: Large)   Pulse 68   Ht 5' 10.5" (1.791 m)   Wt (!) 305 lb (138.3 kg)   BMI 43.14 kg/m  Last Weight:  Wt Readings from Last 1 Encounters:  08/20/22 (!) 305 lb (138.3 kg)   Last Height:   Ht Readings from Last 1 Encounters:  08/20/22 5' 10.5" (1.791 m)    Exam: NAD, pleasant                  Speech:    Speech is normal; fluent and spontaneous with normal comprehension.  Cognition:    The patient is oriented to person, place, and time;     recent and remote memory intact;     language fluent;    Cranial Nerves:    The pupils are equal, round, and reactive to light.Trigeminal sensation is intact and the muscles of mastication are normal. The face is symmetric. The palate elevates in the midline. Hearing intact. Voice is normal.  Shoulder shrug is normal. The tongue has normal motion without fasciculations.   Coordination:  No dysmetria  Motor Observation:    No asymmetry, no atrophy, and no involuntary movements noted. Tone:    Normal muscle tone.     Strength:    Strength is V/V in the upper and lower limbs.      Sensation: intact to LT   Assessment/Plan:  He is doing well. No problems. No seizures. No side effects. Very stable on zonisamide 200mg  at bedtime. Last eeg was 2021 did not show seizures (but did show that he has epileptiform activity so will stay on zonisamide for life). he has a history of seizures since April 2017, he could not tolerate Keppra, Vimpat was too expensive, he is doing well on zonisamide at this time for several years stable.No issues for years. Will return to pcp.   - will refill zonisamide - always welcome to come back - ask dr Gerda Diss to refill zonisamide since so stable. I sent him a staff message - discussed seizures, stay on zonisamide for life, here with wife who also provides much information.     Meds ordered this encounter  Medications   zonisamide (ZONEGRAN) 100 MG capsule    Sig: TAKE (2) CAPSULES BY MOUTH ONCE DAILY.    Dispense:  180 capsule    Refill:  4    Cc: Luking, Jonna Coup, MD,    Naomie Dean, MD  [facility] [address] [place], [place] [address]  Phone [phone] Fax [phone]  I spent 30 minutes of face-to-face and non-face-to-face time with patient on the  1. Focal epilepsy (HCC)    diagnosis.  This included previsit chart review, lab review, study review, order entry, electronic health record documentation, patient education on the different diagnostic and therapeutic options, counseling and coordination of care, risks and benefits of management, compliance, or risk factor reduction

## 2022-08-20 NOTE — Patient Instructions (Signed)
Continue zonisamide  Zonisamide Capsules What is this medication? ZONISAMIDE (zoe NIS a mide) prevents and controls seizures in people with epilepsy. It works by calming overactive nerves in your body. This medicine may be used for other purposes; ask your health care provider or pharmacist if you have questions. COMMON BRAND NAME(S): Zonegran What should I tell my care team before I take this medication? They need to know if you have any of these conditions: Kidney disease Liver disease Low levels of bicarbonate in your blood Lung disease Suicidal thoughts, plans, or attempt An unusual or allergic reaction to zonisamide, sulfa medications, other medications, foods, dyes, or preservatives Pregnant or trying to get pregnant Breast-feeding How should I use this medication? Take this medication by mouth with a glass of water. Follow the directions on the prescription label. Do not cut, crush or chew this medication. Swallow the capsules whole. You can take this medication with or without food. If it upsets your stomach, take it with food. Take your medication at regular intervals. Do not take it more often than directed. Do not stop taking except on your care team's advice. A special MedGuide will be given to you by the pharmacist with each prescription and refill. Be sure to read this information carefully each time. Talk to your care team about the use of this medication in children. While this medication may be prescribed for children as young as 16 years for selected conditions, precautions do apply. Overdosage: If you think you have taken too much of this medicine contact a poison control center or emergency room at once. NOTE: This medicine is only for you. Do not share this medicine with others. What if I miss a dose? If you miss a dose, take it as soon as you can. If it is almost time for your next dose, take only that dose. Do not take double or extra doses. What may interact with this  medication? Acetazolamide Alcohol Antihistamines for allergy, cough, and cold Certain medications for anxiety or sleep Certain medications for depression, such as amitriptyline, fluoxetine, sertraline Certain medications for seizures, such as carbamazepine, phenobarbital, phenytoin, primidone, topiramate Dichlorphenamide General anesthetics, such as halothane, isoflurane, methoxyflurane, propofol Medications that relax muscles for surgery Opioid medications for pain or cough Phenothiazines, such as chlorpromazine, mesoridazine, prochlorperazine, thioridazine Rifampin This list may not describe all possible interactions. Give your health care provider a list of all the medicines, herbs, non-prescription drugs, or dietary supplements you use. Also tell them if you smoke, drink alcohol, or use illegal drugs. Some items may interact with your medicine. What should I watch for while using this medication? Visit your care team for regular checks on your progress. Tell your care team if your symptoms do not start to get better or if they get worse. Wear a medical ID bracelet or chain. Carry a card that describes your condition. List the medications and doses you take on the card. Do not suddenly stop taking this medication. You may develop a severe reaction. Your care team will tell you how much medication to take. If your care team wants you to stop the medication, the dose may be slowly lowered over time to avoid any side effects. This medication may affect your coordination, reaction time, or judgement. Do not drive or operate machinery until you know how this medication affects you. Sit up or stand slowly to reduce the risk of dizzy or fainting spells. Drinking alcohol with this medication can increase the risk of these side effects.  Tell your care team right away if you have any change in your eyesight. This medication may cause serious skin reactions. They can happen weeks to months after starting  the medication. Contact your care team right away if you notice fevers or flu-like symptoms with a rash. The rash may be red or purple and then turn into blisters or peeling of the skin. You may also notice a red rash with swelling of the face, lips, or lymph nodes in your neck or under your arms. Watch for new or worsening thoughts of suicide or depression. This includes sudden changes in mood, behaviors, or thoughts. These changes can happen at any time but are more common in the beginning of treatment or after a change in dose. Call your care team right away if you experience these thoughts or worsening depression. Talk to your care team if you wish to become pregnant or think you might be pregnant. This medication can cause serious birth defects. What side effects may I notice from receiving this medication? Side effects that you should report to your care team as soon as possible: Allergic reactions--skin rash, itching, hives, swelling of the face, lips, tongue, or throat Aplastic anemia--unusual weakness or fatigue, dizziness, headache, trouble breathing, increased bleeding or bruising CNS depression--slow or shallow breathing, shortness of breath, feeling faint, dizziness, confusion, trouble staying awake Fever that does not go away, decreased sweating High acid level--trouble breathing, unusual weakness or fatigue, confusion, headache, fast or irregular heartbeat, nausea, vomiting High ammonia level--unusual weakness or fatigue, confusion, loss of appetite, nausea, vomiting, seizures Infection--fever, chills, cough, or sore throat Kidney stones--blood in urine, pain or trouble passing urine, pain in the lower back or sides Rash, fever, and swollen lymph nodes Redness, blistering, peeling, or loosening of the skin, including inside the mouth Sudden eye pain or change in vision such as blurry vision, seeing halos around lights, vision loss Thoughts of suicide or self harm, worsening mood,  feelings of depression Side effects that usually do not require medical attention (report to your care team if they continue or are bothersome): Difficulty with paying attention, memory, or speech Dizziness Drowsiness Irritability Loss of appetite Loss of balance or coordination Slow or sluggish movements of the body This list may not describe all possible side effects. Call your doctor for medical advice about side effects. You may report side effects to FDA at 1-800-FDA-1088. Where should I keep my medication? Keep out of the reach of children and pets. Store at room temperature between 20 and 25 degrees C (68 and 77 degrees F). Protect from light. Get rid of any unused medication after the expiration date. To get rid of medications that are no longer needed or have expired: Take the medication to a medication take-back program. Check with your pharmacy or law enforcement to find a location. If you cannot return the medication, check the label or package insert to see if the medication should be thrown out in the garbage or flushed down the toilet. If you are not sure, ask your care team. If it is safe to put it in the trash, pour the medication out of the container. Mix the medication with cat litter, dirt, coffee grounds, or other unwanted substance. Seal the mixture in a bag or container. Put it in the trash. NOTE: This sheet is a summary. It may not cover all possible information. If you have questions about this medicine, talk to your doctor, pharmacist, or health care provider.  2023 Elsevier/Gold Standard (2020-11-05  00:00:00)  

## 2022-10-17 ENCOUNTER — Other Ambulatory Visit: Payer: Self-pay | Admitting: Family Medicine

## 2022-11-27 ENCOUNTER — Telehealth: Payer: Self-pay | Admitting: Cardiovascular Disease

## 2022-11-27 NOTE — Telephone Encounter (Signed)
    Primary Cardiologist: Tonny Bollman, MD  Chart reviewed as part of pre-operative protocol coverage. Simple dental extractions are considered low risk procedures per guidelines and generally do not require any specific cardiac clearance. It is also generally accepted that for simple extractions and dental cleanings, there is no need to interrupt blood thinner therapy.   SBE prophylaxis is not required for the patient.  I will route this recommendation to the requesting party via Epic fax function and remove from pre-op pool.  Please call with questions.  Ronney Asters, NP 11/27/2022, 11:13 AM

## 2022-11-27 NOTE — Telephone Encounter (Signed)
   Pre-operative Risk Assessment    Patient Name: Steven [name]  DOB: [birth year] MRN: [id number]     Request for Surgical Clearance    Procedure:  Dental Extraction - Amount of Teeth to be Pulled:  1  Date of Surgery:  Clearance 12/04/22                                 Surgeon:  Dr. Colin Benton  Surgeon's Group or Practice Name:  [facility]  Phone number:  [phone] Fax number:  [phone]   Type of Clearance Requested:   - Medical  - Pharmacy:  Hold Apixaban (Eliquis)     Type of Anesthesia:  Local    Additional requests/questions:    Alben Spittle   11/27/2022, 10:45 AM

## 2022-12-16 ENCOUNTER — Telehealth: Payer: Self-pay | Admitting: Family Medicine

## 2022-12-16 DIAGNOSIS — H43813 Vitreous degeneration, bilateral: Secondary | ICD-10-CM | POA: Diagnosis not present

## 2022-12-16 DIAGNOSIS — H31001 Unspecified chorioretinal scars, right eye: Secondary | ICD-10-CM | POA: Diagnosis not present

## 2022-12-16 DIAGNOSIS — Z961 Presence of intraocular lens: Secondary | ICD-10-CM | POA: Diagnosis not present

## 2022-12-16 NOTE — Telephone Encounter (Signed)
Patient has appointment on 9/19 for 6 month follow up and wanting to know if he needs labs done

## 2022-12-16 NOTE — Telephone Encounter (Signed)
Currently however would request metabolic 7 only due to hypertension

## 2022-12-17 ENCOUNTER — Other Ambulatory Visit: Payer: Self-pay

## 2022-12-17 DIAGNOSIS — I10 Essential (primary) hypertension: Secondary | ICD-10-CM

## 2022-12-17 NOTE — Telephone Encounter (Signed)
Called pt to inform of test being ordered, no answer and no VM

## 2022-12-19 NOTE — Telephone Encounter (Signed)
Called pt to inform of test being ordered, no answer and no VM

## 2022-12-22 NOTE — Telephone Encounter (Signed)
Called and left a message for patient regarding lab orders

## 2022-12-23 DIAGNOSIS — I10 Essential (primary) hypertension: Secondary | ICD-10-CM | POA: Diagnosis not present

## 2022-12-24 LAB — BASIC METABOLIC PANEL
BUN/Creatinine Ratio: 22 (ref 10–24)
BUN: 24 mg/dL (ref 8–27)
CO2: 23 mmol/L (ref 20–29)
Calcium: 9.3 mg/dL (ref 8.6–10.2)
Chloride: 106 mmol/L (ref 96–106)
Creatinine, Ser: 1.07 mg/dL (ref 0.76–1.27)
Glucose: 122 mg/dL — ABNORMAL HIGH (ref 70–99)
Potassium: 4.8 mmol/L (ref 3.5–5.2)
Sodium: 141 mmol/L (ref 134–144)
eGFR: 70 mL/min/{1.73_m2} (ref >59)

## 2022-12-29 ENCOUNTER — Ambulatory Visit (INDEPENDENT_AMBULATORY_CARE_PROVIDER_SITE_OTHER): Payer: PPO | Admitting: Family Medicine

## 2022-12-29 ENCOUNTER — Other Ambulatory Visit: Payer: Self-pay | Admitting: Family Medicine

## 2022-12-29 ENCOUNTER — Encounter: Payer: Self-pay | Admitting: Family Medicine

## 2022-12-29 VITALS — BP 128/80 | HR 59 | Temp 97.7°F | Wt 307.8 lb

## 2022-12-29 DIAGNOSIS — I4819 Other persistent atrial fibrillation: Secondary | ICD-10-CM | POA: Diagnosis not present

## 2022-12-29 DIAGNOSIS — Z23 Encounter for immunization: Secondary | ICD-10-CM | POA: Diagnosis not present

## 2022-12-29 MED ORDER — APIXABAN 5 MG PO TABS
ORAL_TABLET | ORAL | 5 refills | Status: AC
Start: 2022-12-29 — End: ?

## 2022-12-29 MED ORDER — TAMSULOSIN HCL 0.4 MG PO CAPS: 0.4000 mg | ORAL_CAPSULE | Freq: Every day | ORAL | 3 refills | Status: DC

## 2022-12-29 MED ORDER — DULOXETINE HCL 60 MG PO CPEP: ORAL_CAPSULE | ORAL | 1 refills | Status: DC

## 2022-12-29 MED ORDER — LISINOPRIL 2.5 MG PO TABS: 2.5000 mg | ORAL_TABLET | Freq: Every day | ORAL | 1 refills | Status: DC

## 2022-12-29 MED ORDER — ZONISAMIDE 100 MG PO CAPS: ORAL_CAPSULE | ORAL | 4 refills | Status: DC

## 2022-12-29 NOTE — Progress Notes (Signed)
Subjective:    Patient ID: Steven [name], male    DOB: [birth year], 82 y.o.   MRN: [id number]  HPI Patient coming in for a 6 month follow up for hypertension. Patient for blood pressure check up.  The patient does have hypertension.   Patient relates dietary measures try to minimize salt The importance of healthy diet and activity were discussed Patient relates compliance     Patient states he has been feeling dizzy for the past 3 weeks. States he had right ear pain 3 weeks ago and now has pain on left ear.  Describes it more so as a room spinning denies any unilateral numbness weakness.  No headaches or vomiting patient feels off balance. Patient states he has Vertigo. No further issues or concerns.   Lab reviewed with patient Results for orders placed or performed in visit on 12/17/22  Basic Metabolic Panel  Result Value Ref Range   Glucose 122 (H) 70 - 99 mg/dL   BUN 24 8 - 27 mg/dL   Creatinine, Ser 4.09 0.76 - 1.27 mg/dL   eGFR 70 >81 XB/JYN/8.29   BUN/Creatinine Ratio 22 10 - 24   Sodium 141 134 - 144 mmol/L   Potassium 4.8 3.5 - 5.2 mmol/L   Chloride 106 96 - 106 mmol/L   CO2 23 20 - 29 mmol/L   Calcium 9.3 8.6 - 10.2 mg/dL    Review of Systems     Objective:   Physical Exam General-in no acute distress Eyes-no discharge Lungs-respiratory rate normal, CTA CV-no murmurs,RRR Extremities skin warm dry no edema Neuro grossly normal Behavior normal, alert        Assessment & Plan:  Seizure disorder stable we will get medicine through [place] Please see previous neurology note they do not feel they need to see him anymore  Morbid obesity portion control regular activity  Dizziness-inner ear-should gradually correct itself over the next 2 weeks if not notify us and we will help set up an ENT  Low energy not using CPAP encouraged him to do so he did not feel like it was helping him and he Recent labs reviewed with patient  He does have prediabetes he has been  encouraged to eat healthy stay active try to lose weight although it is difficult at his age  Follow-up 6 months comprehensive lab work at that time

## 2023-01-14 ENCOUNTER — Telehealth: Payer: Self-pay

## 2023-01-14 NOTE — Telephone Encounter (Signed)
Application has been signed and faxed.

## 2023-01-14 NOTE — Telephone Encounter (Signed)
**Note De-Identified Porsha Skilton Obfuscation** We received the providers page of the pts [employer] application for Eliquis assistance from Rosanne Sack with [employer] with request for [place] to complete and to fax to [employer]. Per the fax, if we have any questions to call her at [phone].  I have completed the [employer] providers page and have e-mailed it to the nurse working with Dr Excell Seltzer today so she can obtain his signature, date it and to then fax it to [facility] at the fax number written on the cover letter included.

## 2023-01-15 ENCOUNTER — Encounter: Payer: Self-pay | Admitting: Cardiovascular Disease

## 2023-01-15 ENCOUNTER — Ambulatory Visit: Payer: PPO | Attending: Cardiovascular Disease | Admitting: Cardiovascular Disease

## 2023-01-15 VITALS — BP 122/82 | HR 74 | Ht 70.5 in | Wt 309.4 lb

## 2023-01-15 DIAGNOSIS — I4819 Other persistent atrial fibrillation: Secondary | ICD-10-CM | POA: Diagnosis not present

## 2023-01-15 DIAGNOSIS — I350 Nonrheumatic aortic (valve) stenosis: Secondary | ICD-10-CM

## 2023-01-15 DIAGNOSIS — I2510 Atherosclerotic heart disease of native coronary artery without angina pectoris: Secondary | ICD-10-CM

## 2023-01-15 DIAGNOSIS — Z6841 Body Mass Index (BMI) 40.0 and over, adult: Secondary | ICD-10-CM

## 2023-01-15 NOTE — Progress Notes (Signed)
Cardiology Office Note:    Date:  01/15/2023   ID:  Steven [name], DOB May 13, 1940, MRN [id number]  PCP:  Babs Sciara, MD   [facility] Providers Cardiologist:  Tonny Bollman, MD     Referring MD: Babs Sciara, MD   Chief Complaint  Patient presents with   Atrial Fibrillation    History of Present Illness:    Steven [name] is a 82 y.o. male with a hx of coronary artery disease and atrial fibrillation, presenting for follow-up evaluation.  The patient is here with his wife today.  He complains of progressive shortness of breath.  He is extremely sedentary.  When he does get out and do some activity, he generally sleeps the rest the entire next day.  He denies orthopnea or PND.  He has had no recent change in chronic leg swelling.  No heart palpitations or chest pain.  Tolerating anticoagulation without bleeding problems.  Past Medical History:  Diagnosis Date   Chronic pain    Complication of anesthesia    pt woke up during last surgery    Coronary artery disease 02-27-11   Dr. Marliss Coots follows-has some blocked coronary arteries ,not suitable for stent  placement   Dyslipidemia    Dysrhythmia    hx of atrial fib during last hospitalization    GERD (gastroesophageal reflux disease) 02-27-11   Acid reflux   Hearing loss 02-27-11   Bilateral hearing aids-due to exposure to loud machinery   Hemorrhoids 02-27-11   not bothersome at this time   Hyperlipidemia    Hypertension    Leg swelling    Obesity    Osteoarthritis 02-27-11   Ostearthritis-knees, shoulders.Back causes chronic pain-radiates down right leg   Rash    Seizures (HCC) 07/2014   Shingles outbreak 02-27-11   2 weeks ago , was tx.-only residual is tenderness of right scalp-no open areas   Shortness of breath    with exertion    Urinary incontinence 02-27-11   not an everday occurrence-no special measures    Past Surgical History:  Procedure Laterality Date   CHOLECYSTECTOMY   03/14/2011   Procedure: LAPAROSCOPIC CHOLECYSTECTOMY;  Surgeon: Mariella Saa, MD;  Location: [facility];  Service: General;  Laterality: N/A;   COLONOSCOPY     INJECTION KNEE  03/05/2011   Procedure: KNEE INJECTION;  Surgeon: Loanne Drilling;  Location: [facility];  Service: Orthopedics;  Laterality: Left;  80 mg depomedrol   LAPAROSCOPY  03/16/2011   Procedure: LAPAROSCOPY DIAGNOSTIC;  Surgeon: Rulon Abide, DO;  Location: [facility];  Service: General;  Laterality: N/A;  repair incisional hernia   REPLACEMENT TOTAL KNEE  02-27-11   right- 2007   TOTAL KNEE ARTHROPLASTY  03/01/2012   Procedure: TOTAL KNEE ARTHROPLASTY;  Surgeon: Loanne Drilling, MD;  Location: [facility];  Service: Orthopedics;  Laterality: Left;   TOTAL KNEE REVISION  03/05/2011   Procedure: TOTAL KNEE REVISION;  Surgeon: Gus Rankin Aluisio;  Location: [facility];  Service: Orthopedics;  Laterality: Right;    Current Medications: Current Meds  Medication Sig   apixaban (ELIQUIS) 5 MG TABS tablet TAKE (1) TABLET BY MOUTH TWICE DAILY.   atorvastatin (LIPITOR) 80 MG tablet Take 1 tablet (80 mg total) by mouth daily.   cholecalciferol (VITAMIN D) 1000 UNITS tablet Take 1,000 Units by mouth daily.   Cyanocobalamin (VITAMIN B-12 PO) Take 1,000 mg by mouth daily.   DULoxetine (CYMBALTA) 60 MG capsule 1 qd   lisinopril (  ZESTRIL) 2.5 MG tablet Take 1 tablet (2.5 mg total) by mouth daily.   Omega-3 Fatty Acids (FISH OIL) 1000 MG CAPS Take 1,000 mg by mouth daily.   pantoprazole (PROTONIX) 40 MG tablet TAKE 1 TABLET BY MOUTH DAILY   Probiotic Product (ALIGN PO) Take by mouth every morning.   tamsulosin (FLOMAX) 0.4 MG CAPS capsule Take 1 capsule (0.4 mg total) by mouth daily.   zonisamide (ZONEGRAN) 100 MG capsule TAKE (2) CAPSULES BY MOUTH ONCE DAILY.     Allergies:   Ciprofloxacin, Oxycodone-acetaminophen, and Oxycodone-acetaminophen   Social History   Socioeconomic History   Marital status: Married    Spouse name: Not on file    Number of children: 3   Years of education: 12   Highest education level: Not on file  Occupational History   Occupation: Retired    Associate Professor: RETIRED  Tobacco Use   Smoking status: Former   Smokeless tobacco: Former    Types: Chew    Quit date: 01/27/1973   Tobacco comments:    chewing   Vaping Use   Vaping status: Never Used  Substance and Sexual Activity   Alcohol use: No   Drug use: No   Sexual activity: Yes    Birth control/protection: None  Other Topics Concern   Not on file  Social History Narrative   Lives at home w/ his wife   Right-handed   Caffeine: 2 cups/day   Social Determinants of Health   Financial Resource Strain: Not on file  Food Insecurity: Not on file  Transportation Needs: Not on file  Physical Activity: Not on file  Stress: Not on file  Social Connections: Not on file     Family History: The patient's family history includes COPD in his father; Cancer in his father; Heart attack in his father; Heart attack (age of onset: 78) in his sister; Heart disease in his mother; Hypertension in his mother; Other (age of onset: 87) in his mother. There is no history of Seizures, Transient ischemic attack, or Stroke.  ROS:   Please see the history of present illness.    All other systems reviewed and are negative.  EKGs/Labs/Other Studies Reviewed:    The following studies were reviewed today: Echo 11/14/21: 1. Left ventricular ejection fraction, by estimation, is 60 to 65%. The  left ventricle has normal function. The left ventricle has no regional  wall motion abnormalities. There is mild concentric left ventricular  hypertrophy. Left ventricular diastolic  function could not be evaluated.   2. Right ventricular systolic function is normal. The right ventricular  size is normal.   3. Left atrial size was severely dilated.   4. Right atrial size was mildly dilated.   5. The mitral valve is normal in structure. Mild mitral valve  regurgitation. No  evidence of mitral stenosis. Moderate mitral annular  calcification.   6. The aortic valve is normal in structure. Aortic valve regurgitation is  mild. Moderate aortic valve stenosis.   7. Aortic dilatation noted. There is mild dilatation of the ascending  aorta, measuring 45 mm.   8. The inferior vena cava is normal in size with greater than 50%  respiratory variability, suggesting right atrial pressure of 3 mmHg.   EKG Interpretation Date/Time:  Thursday January 15 2023 10:26:02 EDT Ventricular Rate:  74 PR Interval:    QRS Duration:  92 QT Interval:  372 QTC Calculation: 412 R Axis:   39  Text Interpretation: Atrial fibrillation Anterior infarct , age  undetermined When compared with ECG of 19-Jul-2015 03:48, PREVIOUS ECG IS PRESENT No significant change was found Confirmed by Tonny Bollman [phone]) on 01/15/2023 10:39:05 AM    Recent Labs: 06/23/2022: ALT 13 12/23/2022: BUN 24; Creatinine, Ser 1.07; Potassium 4.8; Sodium 141  Recent Lipid Panel    Component Value Date/Time   CHOL 125 06/23/2022 0817   TRIG 65 06/23/2022 0817   HDL 47 06/23/2022 0817   CHOLHDL 2.7 06/23/2022 0817   CHOLHDL 3.4 07/20/2015 0435   VLDL 19 07/20/2015 0435   LDLCALC 64 06/23/2022 0817         Physical Exam:    VS:  BP 122/82   Pulse 74   Ht 5' 10.5" (1.791 m)   Wt (!) 309 lb 6.4 oz (140.3 kg)   SpO2 98%   BMI 43.77 kg/m     Wt Readings from Last 3 Encounters:  01/15/23 (!) 309 lb 6.4 oz (140.3 kg)  12/29/22 (!) 307 lb 12.8 oz (139.6 kg)  08/20/22 (!) 305 lb (138.3 kg)     GEN:  Well nourished, well developed pleasant obese male in no acute distress HEENT: Normal NECK: No JVD; No carotid bruits LYMPHATICS: No lymphadenopathy CARDIAC: Irregularly irregular, 2/6 harsh mid peaking crescendo decrescendo murmur at the right upper sternal border, no diastolic murmur, A2 is diminished. RESPIRATORY:  Clear to auscultation without rales, wheezing or rhonchi  ABDOMEN: Soft, non-tender,  non-distended MUSCULOSKELETAL:  No edema; No deformity  SKIN: Warm and dry NEUROLOGIC:  Alert and oriented x 3 PSYCHIATRIC:  Normal affect   ASSESSMENT:    1. Persistent atrial fibrillation (HCC)   2. Coronary artery disease involving native coronary artery of native heart without angina pectoris   3. Nonrheumatic aortic valve stenosis   4. BMI 40.0-44.9, adult (HCC)    PLAN:    In order of problems listed above:  Patient with rate controlled atrial fibrillation.  Tolerating anticoagulation with apixaban.  Continue current therapy. Remains clinically stable with no symptoms of angina. Most recent echo reviewed and demonstrates moderate aortic stenosis.  LVEF 60 to 65%, mean transaortic gradient of 19 mmHg, calculated valve area 1.1 cm.  Patient with progressive dyspnea.  I suspect his aortic stenosis is in the moderate range based on his exam and typical natural history of disease progression.  However, with worsening symptoms and exam suggestive of at least moderate AAS, recommend repeat echocardiogram. Lengthy discussion with him about portion size, weight loss measures, and the importance of trying to stay as active as possible and losing weight especially in light of his probable need for future intervention on his aortic valve.      Medication Adjustments/Labs and Tests Ordered: Current medicines are reviewed at length with the patient today.  Concerns regarding medicines are outlined above.  Orders Placed This Encounter  Procedures   EKG 12-Lead   ECHOCARDIOGRAM COMPLETE   No orders of the defined types were placed in this encounter.   Patient Instructions  Medication Instructions:  Your physician recommends that you continue on your current medications as directed. Please refer to the Current Medication list given to you today.  *If you need a refill on your cardiac medications before your next appointment, please call your pharmacy*  Lab Work: NONE If you have labs  (blood work) drawn today and your tests are completely normal, you will receive your results only by: MyChart Message (if you have MyChart) OR A paper copy in the mail If you have any lab test that  is abnormal or we need to change your treatment, we will call you to review the results.  Testing/Procedures: ECHO Your physician has requested that you have an echocardiogram. Echocardiography is a painless test that uses sound waves to create images of your heart. It provides your doctor with information about the size and shape of your heart and how well your heart's chambers and valves are working. This procedure takes approximately one hour. There are no restrictions for this procedure. Please do NOT wear cologne, perfume, aftershave, or lotions (deodorant is allowed). Please arrive 15 minutes prior to your appointment time.  Follow-Up: At [facility], you and your health needs are our priority.  As part of our continuing mission to provide you with exceptional heart care, we have created designated Provider Care Teams.  These Care Teams include your primary Cardiologist (physician) and Advanced Practice Providers (APPs -  Physician Assistants and Nurse Practitioners) who all work together to provide you with the care you need, when you need it.  Your next appointment:   1 year(s)  Provider:   Tonny Bollman, MD        Signed, Tonny Bollman, MD  01/15/2023 1:25 PM    [facility]

## 2023-01-15 NOTE — Patient Instructions (Signed)
Medication Instructions:  Your physician recommends that you continue on your current medications as directed. Please refer to the Current Medication list given to you today.  *If you need a refill on your cardiac medications before your next appointment, please call your pharmacy*   Lab Work: NONE If you have labs (blood work) drawn today and your tests are completely normal, you will receive your results only by: MyChart Message (if you have MyChart) OR A paper copy in the mail If you have any lab test that is abnormal or we need to change your treatment, we will call you to review the results.   Testing/Procedures: ECHO Your physician has requested that you have an echocardiogram. Echocardiography is a painless test that uses sound waves to create images of your heart. It provides your doctor with information about the size and shape of your heart and how well your heart's chambers and valves are working. This procedure takes approximately one hour. There are no restrictions for this procedure. Please do NOT wear cologne, perfume, aftershave, or lotions (deodorant is allowed). Please arrive 15 minutes prior to your appointment time.  Follow-Up: At [facility], you and your health needs are our priority.  As part of our continuing mission to provide you with exceptional heart care, we have created designated Provider Care Teams.  These Care Teams include your primary Cardiologist (physician) and Advanced Practice Providers (APPs -  Physician Assistants and Nurse Practitioners) who all work together to provide you with the care you need, when you need it.  Your next appointment:   1 year(s)  Provider:   Timber Cooper, MD     

## 2023-01-19 NOTE — Telephone Encounter (Signed)
Pt spouse called in stating they received a call from [facility] that Dr. Excell Seltzer portion of the application was not filled out to its entirety and it needs to be filled out again. She also stated it was missing where the pt is being seen. Please advise.

## 2023-01-20 NOTE — Telephone Encounter (Signed)
**Note De-Identified Cree Kunert Obfuscation** I called [employer] and was advised by Deanna that there was no household size written on the application and they cannot read the physician's date.  I have completed another providers page with our DOD, Dr Trula Ore information as Dr Excell Seltzer is not in office today and I have e-mailed it to the nurse working with him today so she can obtain his signature, date it , and to either fax to [facility] at the fax number written on the cover letter included or to email back to me and I will fax to [employer].  I called the pts home number but got no answer so I left a message on their VM (Ok per [facility]) advising them that we are faxing a new providers page to [facility] today and that if more or less people than 2 live in their household to please contact [employer] at [phone] to tell them the correct amount of people that live in their household. I left my name and the offices phone number in the message in case they have any questions or concerns.

## 2023-01-20 NOTE — Telephone Encounter (Signed)
MD signed Eliquis pt assistance application faxed to number requested.  Ok confirmation received placed in nurse fax box.

## 2023-02-03 ENCOUNTER — Ambulatory Visit (HOSPITAL_COMMUNITY): Payer: PPO | Attending: Internal Medicine

## 2023-02-03 DIAGNOSIS — I4819 Other persistent atrial fibrillation: Secondary | ICD-10-CM

## 2023-02-03 DIAGNOSIS — I080 Rheumatic disorders of both mitral and aortic valves: Secondary | ICD-10-CM | POA: Diagnosis not present

## 2023-02-03 DIAGNOSIS — I517 Cardiomegaly: Secondary | ICD-10-CM

## 2023-02-03 DIAGNOSIS — I350 Nonrheumatic aortic (valve) stenosis: Secondary | ICD-10-CM | POA: Diagnosis not present

## 2023-02-03 LAB — ECHOCARDIOGRAM COMPLETE
AR max vel: 1.75 cm2
AV Area VTI: 1.72 cm2
AV Area mean vel: 1.59 cm2
AV Mean grad: 25 mm[Hg]
AV Peak grad: 44.6 mm[Hg]
Ao pk vel: 3.34 m/s
P 1/2 time: 583 ms
S' Lateral: 3.3 cm

## 2023-03-20 ENCOUNTER — Ambulatory Visit (INDEPENDENT_AMBULATORY_CARE_PROVIDER_SITE_OTHER): Payer: PPO | Admitting: Physician Assistant

## 2023-03-20 ENCOUNTER — Encounter: Payer: Self-pay | Admitting: Physician Assistant

## 2023-03-20 VITALS — BP 130/90 | HR 74 | Temp 97.6°F | Ht 70.0 in | Wt 308.0 lb

## 2023-03-20 DIAGNOSIS — J22 Unspecified acute lower respiratory infection: Secondary | ICD-10-CM

## 2023-03-20 DIAGNOSIS — R058 Other specified cough: Secondary | ICD-10-CM | POA: Diagnosis not present

## 2023-03-20 DIAGNOSIS — R062 Wheezing: Secondary | ICD-10-CM

## 2023-03-20 MED ORDER — ALBUTEROL SULFATE HFA 108 (90 BASE) MCG/ACT IN AERS
2.0000 | INHALATION_SPRAY | Freq: Four times a day (QID) | RESPIRATORY_TRACT | 0 refills | Status: DC | PRN
Start: 2023-03-20 — End: 2024-01-04

## 2023-03-20 MED ORDER — AMOXICILLIN-POT CLAVULANATE 500-125 MG PO TABS: 1.0000 | ORAL_TABLET | Freq: Two times a day (BID) | ORAL | 0 refills | Status: DC

## 2023-03-20 MED ORDER — PROMETHAZINE-DM 6.25-15 MG/5ML PO SYRP: 5.0000 mL | ORAL_SOLUTION | Freq: Four times a day (QID) | ORAL | 0 refills | Status: DC | PRN

## 2023-03-20 NOTE — Progress Notes (Signed)
   Acute Office Visit  Subjective:     Patient ID: Steven [name], male    DOB: [birth year], 82 y.o.   MRN: [id number]   HPI Patient presents today for complaints of upper respiratory symptoms.  He states symptoms began 8 days ago with low-grade fever, cough, and congestion. He reports symptoms are improving however still is feeling fatigued with congestion and cough with wheezing. He states cough are productive wit frequent sputum production. He reports using Mucinex and tylenol OTC for symptom relief. He adamantly requests antibiotic today.   Review of Systems  Constitutional:  Positive for fever.  Respiratory:  Positive for cough and wheezing.        Objective:     BP (!) 130/90   Pulse 74   Temp 97.6 F (36.4 C) (Oral)   Ht 5\' 10"  (1.778 m)   Wt (!) 308 lb (139.7 kg)   SpO2 96%   BMI 44.19 kg/m   Physical Exam Constitutional:      Appearance: Normal appearance. He is obese.  HENT:     Nose: Congestion present.     Mouth/Throat:     Mouth: Mucous membranes are moist.     Pharynx: Oropharynx is clear.  Eyes:     Extraocular Movements: Extraocular movements intact.  Cardiovascular:     Rate and Rhythm: Normal rate. Rhythm irregular.     Pulses: Normal pulses.     Heart sounds: No murmur heard.    No gallop.  Pulmonary:     Breath sounds: Normal breath sounds. No stridor. No wheezing, rhonchi or rales.  Musculoskeletal:     Cervical back: Normal range of motion.  Neurological:     Mental Status: He is alert.     No results found for any visits on 03/20/23.      Assessment & Plan:  Patient presents well today on exam.  No signs of obvious infection or worsening of symptoms.  Albuterol inhaler and promethazine DM sent to pharmacy.  He was counseled to continue his over-the-counter Mucinex as well as albuterol and cough syrup.  Augmentin sent to pharmacy with intentions of patient to continue with symptomatic management prior to filling antibiotic as he was  advised that symptoms are likely viral in nature and likely do not need antibiotic. As patient was persistent on need for antibiotic, I expect he will fill prescription today. He is to follow up if symptoms worsen.   Return if symptoms worsen or fail to improve.  Toni Amend Tonesha Tsou, PA-C

## 2023-03-24 ENCOUNTER — Ambulatory Visit: Payer: PPO | Admitting: Family Medicine

## 2023-04-22 ENCOUNTER — Other Ambulatory Visit: Payer: Self-pay | Admitting: Family Medicine

## 2023-05-08 ENCOUNTER — Ambulatory Visit: Payer: PPO

## 2023-05-15 ENCOUNTER — Ambulatory Visit (INDEPENDENT_AMBULATORY_CARE_PROVIDER_SITE_OTHER): Payer: PPO

## 2023-05-15 VITALS — Ht 70.5 in | Wt 300.0 lb

## 2023-05-15 DIAGNOSIS — Z0000 Encounter for general adult medical examination without abnormal findings: Secondary | ICD-10-CM

## 2023-05-15 NOTE — Patient Instructions (Signed)
Steven [name] , Thank you for taking time to come for your Medicare Wellness Visit. I appreciate your ongoing commitment to your health goals. Please review the following plan we discussed and let me know if I can assist you in the future.   Referrals/Orders/Follow-Ups/Clinician Recommendations:  Next Medicare Annual Wellness Visit: May 20, 2024 at 1:00 pm telephone visit  You are due for the vaccines checked below. You may have these done at your preferred pharmacy. Please have them fax the office proof of the vaccines so that we can update your chart.   []  Flu (due annually)  Recommended this fall either at PCP office or through your local pharmacy. The flu season starts August 1 of each year.   []  Shingrix (Shingles vaccine): CDC recommends 2 doses of Shingrix separated by 2-6 months for aged 44 years and older:  []  Pneumonia Vaccines: Recommended for adults 65 years or older  [x]  TDAP (Tetanus) Vaccine every 10 years:Recommended every 10 years; Please call your insurance company to determine your out of pocket expense. You also receive this vaccine at your local pharmacy or Health Dept.  []  Covid-19: Available now at any [facility] pharmacy (see info below)  You may also get your vaccines at any [facility] (locations listed below.) Vaccine hours are Monday - Friday 9:00 - 4:00. No appointments are required. Most insurances are accepted including Medicaid. Anyone can use the community pharmacies, and people are not required to have a [facility] provider.  Community Pharmacy Locations offering vaccines:   Sport and exercise psychologist   [facility] [place] [facility]  10 vaccines are offered at the [employer]: Covid, flu, Tdap, shingles, RSV, pneumonia, meningococcal, hepatitis A, hepatitis B, and HPV.    This is a list of the screening recommended for you and due dates:  Health Maintenance   Topic Date Due   DTaP/Tdap/Td vaccine (2 - Tdap) 11/12/2012   Yearly kidney health urinalysis for diabetes  06/23/2023   Eye exam for diabetics  12/16/2023   Yearly kidney function blood test for diabetes  12/23/2023   Medicare Annual Wellness Visit  05/14/2024   Pneumonia Vaccine  Completed   Flu Shot  Completed   Zoster (Shingles) Vaccine  Completed   HPV Vaccine  Aged Out   Complete foot exam   Discontinued   Hemoglobin A1C  Discontinued   COVID-19 Vaccine  Discontinued   Hepatitis C Screening  Discontinued    Advanced directives: (ACP Link)Information on Advanced Care Planning can be found at [facility] Advance Health Care Directives Advance Health Care Directives ([email or web address]   Next Medicare Annual Wellness Visit scheduled for next year: yes  Preventive Care 65 Years and Older, Male Preventive care refers to lifestyle choices and visits with your health care provider that can promote health and wellness. Preventive care visits are also called wellness exams. What can I expect for my preventive care visit? Counseling During your preventive care visit, your health care provider may ask about your: Medical history, including: Past medical problems. Family medical history. History of falls. Current health, including: Emotional well-being. Home life and relationship well-being. Sexual activity. Memory and ability to understand (cognition). Lifestyle, including: Alcohol, nicotine or tobacco, and drug use. Access to firearms. Diet, exercise, and sleep habits. Work and work Astronomer. Sunscreen use. Safety issues such as seatbelt and bike helmet use. Physical exam Your health care provider will  check your: Height and weight. These may be used to calculate your BMI (body mass index). BMI is a measurement that tells if you are at a healthy weight. Waist circumference. This measures the distance around your waistline. This measurement also tells if you  are at a healthy weight and may help predict your risk of certain diseases, such as type 2 diabetes and high blood pressure. Heart rate and blood pressure. Body temperature. Skin for abnormal spots. What immunizations do I need?  Vaccines are usually given at various ages, according to a schedule. Your health care provider will recommend vaccines for you based on your age, medical history, and lifestyle or other factors, such as travel or where you work. What tests do I need? Screening Your health care provider may recommend screening tests for certain conditions. This may include: Lipid and cholesterol levels. Diabetes screening. This is done by checking your blood sugar (glucose) after you have not eaten for a while (fasting). Hepatitis C test. Hepatitis B test. HIV (human immunodeficiency virus) test. STI (sexually transmitted infection) testing, if you are at risk. Lung cancer screening. Colorectal cancer screening. Prostate cancer screening. Abdominal aortic aneurysm (AAA) screening. You may need this if you are a current or former smoker. Talk with your health care provider about your test results, treatment options, and if necessary, the need for more tests. Follow these instructions at home: Eating and drinking  Eat a diet that includes fresh fruits and vegetables, whole grains, lean protein, and low-fat dairy products. Limit your intake of foods with high amounts of sugar, saturated fats, and salt. Take vitamin and mineral supplements as recommended by your health care provider. Do not drink alcohol if your health care provider tells you not to drink. If you drink alcohol: Limit how much you have to 0-2 drinks a day. Know how much alcohol is in your drink. In the [place] one drink equals one 12 oz bottle of beer (355 mL), one 5 oz glass of wine (148 mL), or one 1 oz glass of hard liquor (44 mL). Lifestyle Brush your teeth every morning and night with fluoride toothpaste. Floss  one time each day. Exercise for at least 30 minutes 5 or more days each week. Do not use any products that contain nicotine or tobacco. These products include cigarettes, chewing tobacco, and vaping devices, such as e-cigarettes. If you need help quitting, ask your health care provider. Do not use drugs. If you are sexually active, practice safe sex. Use a condom or other form of protection to prevent STIs. Take aspirin only as told by your health care provider. Make sure that you understand how much to take and what form to take. Work with your health care provider to find out whether it is safe and beneficial for you to take aspirin daily. Ask your health care provider if you need to take a cholesterol-lowering medicine (statin). Find healthy ways to manage stress, such as: Meditation, yoga, or listening to music. Journaling. Talking to a trusted person. Spending time with friends and family. Safety Always wear your seat belt while driving or riding in a vehicle. Do not drive: If you have been drinking alcohol. Do not ride with someone who has been drinking. When you are tired or distracted. While texting. If you have been using any mind-altering substances or drugs. Wear a helmet and other protective equipment during sports activities. If you have firearms in your house, make sure you follow all gun safety procedures. Minimize exposure to  UV radiation to reduce your risk of skin cancer. What's next? Visit your health care provider once a year for an annual wellness visit. Ask your health care provider how often you should have your eyes and teeth checked. Stay up to date on all vaccines. This information is not intended to replace advice given to you by your health care provider. Make sure you discuss any questions you have with your health care provider. Document Revised: 09/26/2020 Document Reviewed: 09/26/2020 Elsevier Patient Education  2024 [employer].  Understanding Your  Risk for Falls Millions of people have serious injuries from falls each year. It is important to understand your risk of falling. Talk with your health care provider about your risk and what you can do to lower it. If you do have a serious fall, make sure to tell your provider. Falling once raises your risk of falling again. How can falls affect me? Serious injuries from falls are common. These include: Broken bones, such as hip fractures. Head injuries, such as traumatic brain injuries (TBI) or concussions. A fear of falling can cause you to avoid activities and stay at home. This can make your muscles weaker and raise your risk for a fall. What can increase my risk? There are a number of risk factors that increase your risk for falling. The more risk factors you have, the higher your risk of falling. Serious injuries from a fall happen most often to people who are older than 82 years old. Teenagers and young adults ages 92-29 are also at higher risk. Common risk factors include: Weakness in the lower body. Being generally weak or confused due to long-term (chronic) illness. Dizziness or balance problems. Poor vision. Medicines that cause dizziness or drowsiness. These may include: Medicines for your blood pressure, heart, anxiety, insomnia, or swelling (edema). Pain medicines. Muscle relaxants. Other risk factors include: Drinking alcohol. Having had a fall in the past. Having foot pain or wearing improper footwear. Working at a dangerous job. Having any of the following in your home: Tripping hazards, such as floor clutter or loose rugs. Poor lighting. Pets. Having dementia or memory loss. What actions can I take to lower my risk of falling?     Physical activity Stay physically fit. Do strength and balance exercises. Consider taking a regular class to build strength and balance. Yoga and tai chi are good options. Vision Have your eyes checked every year and your prescription  for glasses or contacts updated as needed. Shoes and walking aids Wear non-skid shoes. Wear shoes that have rubber soles and low heels. Do not wear high heels. Do not walk around the house in socks or slippers. Use a cane or walker as told by your provider. Home safety Attach secure railings on both sides of your stairs. Install grab bars for your bathtub, shower, and toilet. Use a non-skid mat in your bathtub or shower. Attach bath mats securely with double-sided, non-slip rug tape. Use good lighting in all rooms. Keep a flashlight near your bed. Make sure there is a clear path from your bed to the bathroom. Use night-lights. Do not use throw rugs. Make sure all carpeting is taped or tacked down securely. Remove all clutter from walkways and stairways, including extension cords. Repair uneven or broken steps and floors. Avoid walking on icy or slippery surfaces. Walk on the grass instead of on icy or slick sidewalks. Use ice melter to get rid of ice on walkways in the winter. Use a cordless phone. Questions to  ask your health care provider Can you help me check my risk for a fall? Do any of my medicines make me more likely to fall? Should I take a vitamin D supplement? What exercises can I do to improve my strength and balance? Should I make an appointment to have my vision checked? Do I need a bone density test to check for weak bones (osteoporosis)? Would it help to use a cane or a walker? Where to find more information Centers for Disease Control and Prevention, STEAD[employer]: TonerPromos.no Community-Based Fall Prevention Programs: TonerPromos.no [employer] on Aging: BaseRingTones.pl Contact a health care provider if: You fall at home. You are afraid of falling at home. You feel weak, drowsy, or dizzy. This information is not intended to replace advice given to you by your health care provider. Make sure you discuss any questions you have with your health care provider. Document Revised:  12/02/2021 Document Reviewed: 12/02/2021 Elsevier Patient Education  2024 [employer].

## 2023-05-15 NOTE — Progress Notes (Signed)
 Please attest and cosign this visit due to patients primary care provider not being in the office at the time the visit was completed.   Because this visit was a virtual/telehealth visit,  certain criteria was not obtained, such a blood pressure, CBG if applicable, and timed get up and go. Any medications not marked as "taking" were not mentioned during the medication reconciliation part of the visit. Any vitals not documented were not able to be obtained due to this being a telehealth visit or patient was unable to self-report a recent blood pressure reading due to a lack of equipment at home via telehealth. Vitals that have been documented are verbally provided by the patient.  Interactive audio and video telecommunications were attempted between this provider and patient, however failed, due to patient having technical difficulties OR patient did not have access to video capability.  We continued and completed visit with audio only.  Subjective:   Steven [name] is a 83 y.o. male who presents for Medicare Annual/Subsequent preventive examination.  Visit Complete: Virtual I connected with  Steven [name] on 05/15/23 by a audio enabled telemedicine application and verified that I am speaking with the correct person using two identifiers.  Patient Location: Home  Provider Location: Home Office  I discussed the limitations of evaluation and management by telemedicine. The patient expressed understanding and agreed to proceed.  Vital Signs: Because this visit was a virtual/telehealth visit, some criteria may be missing or patient reported. Any vitals not documented were not able to be obtained and vitals that have been documented are patient reported.  Patient Medicare AWV questionnaire was completed by the patient on na; I have confirmed that all information answered by patient is correct and no changes since this date.  Cardiac Risk Factors include: advanced age (>41men, >13  women);dyslipidemia;hypertension;male gender;obesity (BMI >30kg/m2);sedentary lifestyle     Objective:    Today's Vitals   05/15/23 0842 05/15/23 0843  Weight: 300 lb (136.1 kg)   Height: 5' 10.5" (1.791 m)   PainSc:  6    Body mass index is 42.44 kg/m.     05/15/2023    8:46 AM 09/25/2017    3:07 PM 09/14/2017    8:08 AM 07/19/2015    1:40 PM 03/01/2012   12:43 PM 03/01/2012    5:50 AM 02/24/2012    8:39 AM  Advanced Directives  Does Patient Have a Medical Advance Directive? No Yes Yes Yes Patient has advance directive, copy not in chart  Patient does not have advance directive;Patient has advance directive, copy not in chart  Type of Advance Directive  Healthcare Power of [employer] Power of [employer] Power of [place];Living will Healthcare Power of [employer] of [place];Living will  Does patient want to make changes to medical advance directive?   No - Patient declined      Copy of Healthcare Power of Attorney in Chart?  Yes Yes No - copy requested     Would patient like information on creating a medical advance directive? No - Patient declined        Pre-existing out of facility DNR order (yellow form or pink MOST form)     No No No    Current Medications (verified) Outpatient Encounter Medications as of 05/15/2023  Medication Sig   albuterol (VENTOLIN HFA) 108 (90 Base) MCG/ACT inhaler Inhale 2 puffs into the lungs every 6 (six) hours as needed for wheezing or shortness of breath.   apixaban (ELIQUIS) 5  MG TABS tablet TAKE (1) TABLET BY MOUTH TWICE DAILY.   atorvastatin (LIPITOR) 80 MG tablet Take 1 tablet (80 mg total) by mouth daily.   cholecalciferol (VITAMIN D) 1000 UNITS tablet Take 1,000 Units by mouth daily.   Cyanocobalamin (VITAMIN B-12 PO) Take 1,000 mg by mouth daily.   DULoxetine (CYMBALTA) 60 MG capsule 1 qd   lisinopril (ZESTRIL) 2.5 MG tablet Take 1 tablet (2.5 mg total) by mouth daily.   Omega-3 Fatty Acids (FISH OIL) 1000  MG CAPS Take 1,000 mg by mouth daily.   pantoprazole (PROTONIX) 40 MG tablet TAKE 1 TABLET BY MOUTH DAILY   Probiotic Product (ALIGN PO) Take by mouth every morning.   promethazine-dextromethorphan (PROMETHAZINE-DM) 6.25-15 MG/5ML syrup Take 5 mLs by mouth 4 (four) times daily as needed for cough.   tamsulosin (FLOMAX) 0.4 MG CAPS capsule Take 1 capsule (0.4 mg total) by mouth daily.   zonisamide (ZONEGRAN) 100 MG capsule TAKE (2) CAPSULES BY MOUTH ONCE DAILY.   amoxicillin-clavulanate (AUGMENTIN) 500-125 MG tablet Take 1 tablet by mouth in the morning and at bedtime. (Patient not taking: Reported on 05/15/2023)   No facility-administered encounter medications on file as of 05/15/2023.    Allergies (verified) Ciprofloxacin, Oxycodone-acetaminophen, and Oxycodone-acetaminophen   History: Past Medical History:  Diagnosis Date   Chronic pain    Complication of anesthesia    pt woke up during last surgery    Coronary artery disease 02-27-11   Dr. Marliss Coots follows-has some blocked coronary arteries ,not suitable for stent  placement   Dyslipidemia    Dysrhythmia    hx of atrial fib during last hospitalization    GERD (gastroesophageal reflux disease) 02-27-11   Acid reflux   Hearing loss 02-27-11   Bilateral hearing aids-due to exposure to loud machinery   Hemorrhoids 02-27-11   not bothersome at this time   Hyperlipidemia    Hypertension    Leg swelling    Obesity    Osteoarthritis 02-27-11   Ostearthritis-knees, shoulders.Back causes chronic pain-radiates down right leg   Rash    Seizures (HCC) 07/2014   Shingles outbreak 02-27-11   2 weeks ago , was tx.-only residual is tenderness of right scalp-no open areas   Shortness of breath    with exertion    Urinary incontinence 02-27-11   not an everday occurrence-no special measures   Past Surgical History:  Procedure Laterality Date   CHOLECYSTECTOMY  03/14/2011   Procedure: LAPAROSCOPIC CHOLECYSTECTOMY;  Surgeon:  Mariella Saa, MD;  Location: [facility];  Service: General;  Laterality: N/A;   COLONOSCOPY     INJECTION KNEE  03/05/2011   Procedure: KNEE INJECTION;  Surgeon: Loanne Drilling;  Location: [facility];  Service: Orthopedics;  Laterality: Left;  80 mg depomedrol   LAPAROSCOPY  03/16/2011   Procedure: LAPAROSCOPY DIAGNOSTIC;  Surgeon: Rulon Abide, DO;  Location: [facility];  Service: General;  Laterality: N/A;  repair incisional hernia   REPLACEMENT TOTAL KNEE  02-27-11   right- 2007   TOTAL KNEE ARTHROPLASTY  03/01/2012   Procedure: TOTAL KNEE ARTHROPLASTY;  Surgeon: Loanne Drilling, MD;  Location: [facility];  Service: Orthopedics;  Laterality: Left;   TOTAL KNEE REVISION  03/05/2011   Procedure: TOTAL KNEE REVISION;  Surgeon: Gus Rankin Aluisio;  Location: [facility];  Service: Orthopedics;  Laterality: Right;   Family History  Problem Relation Age of Onset   Heart attack Father        mi I N HIS 40'S BUT  LIVED INTO HIS 90'S WITH COPD   COPD Father    Cancer Father        colon   Other Mother 65       died multiple med problems   Heart disease Mother    Hypertension Mother    Heart attack Sister 38   Seizures Neg Hx    Transient ischemic attack Neg Hx    Stroke Neg Hx    Social History   Socioeconomic History   Marital status: Married    Spouse name: Not on file   Number of children: 3   Years of education: 12   Highest education level: Not on file  Occupational History   Occupation: Retired    Associate Professor: RETIRED  Tobacco Use   Smoking status: Former   Smokeless tobacco: Former    Types: Chew    Quit date: 01/27/1973   Tobacco comments:    chewing   Vaping Use   Vaping status: Never Used  Substance and Sexual Activity   Alcohol use: No   Drug use: No   Sexual activity: Yes    Birth control/protection: None  Other Topics Concern   Not on file  Social History Narrative   Lives at home w/ his wife   Right-handed   Caffeine: 2 cups/day   Social Drivers of Manufacturing engineer Strain: Low Risk  (05/15/2023)   Overall Financial Resource Strain (CARDIA)    Difficulty of Paying Living Expenses: Not hard at all  Food Insecurity: No Food Insecurity (05/15/2023)   Hunger Vital Sign    Worried About Running Out of Food in the Last Year: Never true    Ran Out of Food in the Last Year: Never true  Transportation Needs: No Transportation Needs (05/15/2023)   PRAPARE - Administrator, Civil Service (Medical): No    Lack of Transportation (Non-Medical): No  Physical Activity: Inactive (05/15/2023)   Exercise Vital Sign    Days of Exercise per Week: 0 days    Minutes of Exercise per Session: 0 min  Stress: No Stress Concern Present (05/15/2023)   [employer] of Occupational Health - Occupational Stress Questionnaire    Feeling of Stress : Not at all  Social Connections: Socially Integrated (05/15/2023)   Social Connection and Isolation Panel [NHANES]    Frequency of Communication with Friends and Family: More than three times a week    Frequency of Social Gatherings with Friends and Family: More than three times a week    Attends Religious Services: More than 4 times per year    Active Member of [employer] or Organizations: Yes    Attends Engineer, structural: More than 4 times per year    Marital Status: Married    Tobacco Counseling Counseling given: Yes Tobacco comments: chewing    Clinical Intake:  Pre-visit preparation completed: Yes  Pain : 0-10 Pain Score: 6  Pain Type: Chronic pain Pain Location: Knee Pain Orientation: Right, Left Pain Descriptors / Indicators: Constant, Aching Pain Onset: More than a month ago Pain Frequency: Constant     BMI - recorded: 42.44 Nutritional Status: BMI > 30  Obese Nutritional Risks: None Diabetes: No  How often do you need to have someone help you when you read instructions, pamphlets, or other written materials from your doctor or pharmacy?: 1 - Never  Interpreter  Needed?: No  Information entered by ::  W CMA   Activities of Daily Living  05/15/2023    8:44 AM  In your present state of health, do you have any difficulty performing the following activities:  Hearing? 1  Comment wears hearing aids  Vision? 0  Difficulty concentrating or making decisions? 0  Walking or climbing stairs? 0  Dressing or bathing? 0  Doing errands, shopping? 0  Preparing Food and eating ? N  Using the Toilet? N  In the past six months, have you accidently leaked urine? N  Do you have problems with loss of bowel control? N  Managing your Medications? N  Managing your Finances? N  Housekeeping or managing your Housekeeping? N    Patient Care Team: Babs Sciara, MD as PCP - General (Family Medicine) Tonny Bollman, MD as PCP - Cardiology (Cardiology)  Indicate any recent Medical Services you may have received from other than [facility] providers in the past year (date may be approximate).     Assessment:   This is a routine wellness examination for Steven [name].  Hearing/Vision screen Hearing Screening - Comments:: Patient wears hearing aids. Patient is utd with yearly exams.   Vision Screening - Comments:: Wears rx glasses - up to date with routine eye exams  Sees Dr. Burgess Estelle in [place]    Goals Addressed             This Visit's Progress    Patient Stated       Lose weight       Depression Screen    05/15/2023    8:49 AM 03/20/2023    9:29 AM 12/29/2022   10:53 AM 06/30/2022    1:13 PM 12/30/2021    8:47 AM 12/28/2020    9:13 AM 09/27/2020    9:32 AM  PHQ 2/9 Scores  PHQ - 2 Score 1 0 2 0 0 0 0  PHQ- 9 Score 1 1 10 9        Fall Risk    05/15/2023    8:47 AM 03/20/2023    9:28 AM 12/29/2022   10:53 AM 06/30/2022    1:13 PM 12/30/2021    8:47 AM  Fall Risk   Falls in the past year? 0 0 0 0 0  Number falls in past yr: 0 0   0  Injury with Fall? 0 0   0  Risk for fall due to : No Fall Risks No Fall Risks  No Fall Risks No Fall Risks   Follow up Falls prevention discussed   Falls evaluation completed Falls evaluation completed    MEDICARE RISK AT HOME: Medicare Risk at Home Any stairs in or around the home?: Yes If so, are there any without handrails?: No Home free of loose throw rugs in walkways, pet beds, electrical cords, etc?: Yes Adequate lighting in your home to reduce risk of falls?: Yes Life alert?: No Use of a cane, walker or w/c?: No Grab bars in the bathroom?: No Shower chair or bench in shower?: No Elevated toilet seat or a handicapped toilet?: No  TIMED UP AND GO:  Was the test performed?  No    Cognitive Function:        05/15/2023    8:49 AM  6CIT Screen  What Year? 0 points  What month? 0 points  What time? 0 points  Count back from 20 0 points  Months in reverse 0 points  Repeat phrase 0 points  Total Score 0 points    Immunizations Immunization History  Administered Date(s) Administered   Fluad Quad(high Dose 65+)  02/07/2020, 12/28/2020, 12/30/2021   Fluad Trivalent(High Dose 65+) 12/29/2022   H1N1 04/26/2008   Influenza Split 01/27/2013   Influenza, High Dose Seasonal PF 01/19/2017, 01/21/2018, 01/24/2019   Influenza,inj,Quad PF,6+ Mos 01/30/2014, 02/12/2015, 02/12/2016   Influenza,inj,quad, With Preservative 01/19/2017   Influenza-Unspecified 01/13/2012, 01/19/2017, 01/21/2018, 01/24/2019   Moderna Sars-Covid-2 Vaccination 05/24/2019, 06/21/2019   Pneumococcal Conjugate-13 01/30/2014   Pneumococcal Polysaccharide-23 01/13/2011   Td 11/13/2002   Zoster Recombinant(Shingrix) 03/09/2017, 10/28/2017    TDAP status: Due, Education has been provided regarding the importance of this vaccine. Advised may receive this vaccine at local pharmacy or Health Dept. Aware to provide a copy of the vaccination record if obtained from local pharmacy or Health Dept. Verbalized acceptance and understanding.  Flu Vaccine status: Up to date  Pneumococcal vaccine status: Up to date  Covid-19  vaccine status: Completed vaccines  Qualifies for Shingles Vaccine? No   Zostavax completed No   Shingrix Completed?: Yes  Screening Tests Health Maintenance  Topic Date Due   DTaP/Tdap/Td (2 - Tdap) 11/12/2012   OPHTHALMOLOGY EXAM  08/13/2018   Medicare Annual Wellness (AWV)  12/31/2022   Diabetic kidney evaluation - Urine ACR  06/23/2023   Diabetic kidney evaluation - eGFR measurement  12/23/2023   Pneumonia Vaccine 48+ Years old  Completed   INFLUENZA VACCINE  Completed   Zoster Vaccines- Shingrix  Completed   HPV VACCINES  Aged Out   FOOT EXAM  Discontinued   HEMOGLOBIN A1C  Discontinued   COVID-19 Vaccine  Discontinued   Hepatitis C Screening  Discontinued    Health Maintenance  Health Maintenance Due  Topic Date Due   DTaP/Tdap/Td (2 - Tdap) 11/12/2012   OPHTHALMOLOGY EXAM  08/13/2018   Medicare Annual Wellness (AWV)  12/31/2022    Colorectal cancer screening: No longer required.   Lung Cancer Screening: (Low Dose CT Chest recommended if Age 59-80 years, 20 pack-year currently smoking OR have quit w/in 15years.) does not qualify.   Lung Cancer Screening Referral: na  Additional Screening:  Hepatitis C Screening: does not qualify; Completed   Vision Screening: Recommended annual ophthalmology exams for early detection of glaucoma and other disorders of the eye. Is the patient up to date with their annual eye exam?  Yes  Who is the provider or what is the name of the office in which the patient attends annual eye exams? [facility] If pt is not established with a provider, would they like to be referred to a provider to establish care? No .   Dental Screening: Recommended annual dental exams for proper oral hygiene  Diabetic Foot Exam: na  Community Resource Referral / Chronic Care Management: CRR required this visit?  No   CCM required this visit?  No     Plan:     I have personally reviewed and noted the following in the patient's  chart:   Medical and social history Use of alcohol, tobacco or illicit drugs  Current medications and supplements including opioid prescriptions. Patient is not currently taking opioid prescriptions. Functional ability and status Nutritional status Physical activity Advanced directives List of other physicians Hospitalizations, surgeries, and ER visits in previous 12 months Vitals Screenings to include cognitive, depression, and falls Referrals and appointments  In addition, I have reviewed and discussed with patient certain preventive protocols, quality metrics, and best practice recommendations. A written personalized care plan for preventive services as well as general preventive health recommendations were provided to patient.     Jordan Hawks Dhara Schepp, CMA  05/15/2023   After Visit Summary: (MyChart) Due to this being a telephonic visit, the after visit summary with patients personalized plan was offered to patient via MyChart   Nurse Notes: na

## 2023-06-16 ENCOUNTER — Telehealth: Payer: Self-pay | Admitting: Cardiovascular Disease

## 2023-06-16 NOTE — Telephone Encounter (Signed)
 Patient c/o Palpitations:  STAT if patient reporting lightheadedness, shortness of breath, or chest pain  How long have you had palpitations/irregular HR/ Afib? Are you having the symptoms now? Past few months   Are you currently experiencing lightheadedness, SOB or CP? Not currently with him   Do you have a history of afib (atrial fibrillation) or irregular heart rhythm? Yes  Have you checked your BP or HR? (document readings if available): No   Are you experiencing any other symptoms? Indigestion, complained of CP Sunday   Patient's spouse is not with pt, requesting callback tomorrow 03/05 before 12 pm.

## 2023-06-18 ENCOUNTER — Other Ambulatory Visit: Payer: Self-pay | Admitting: Family Medicine

## 2023-06-18 NOTE — Telephone Encounter (Signed)
 Returned call to patient's wife who states "something's wrong with him, but I don't know if it's depression or psychological, or what." Says he can walk to mailbox and back and he's wiped out. He is much more SOB lately than usual. Wife states he is depressed since most of his male friends have passed away. She says that they don't have a BP kit at home "and I'm not medical in any way" so she doesn't feel good about checking his pulse. She states that he needs something to occupy his mind because he sits around and "does a whole lot of nothing." Assured her that his ECHO in October showed moderate AS and not likely the sole culprit to what he has going on now. Asked that she reach out to PCP where they can do urinalysis, get appropriate vitals (to ensure not in A-fib RVR since she was very against the thought of trying to get a BP kit or manually check it herself), do labs if needed, etc. She is appreciative and says she felt like this was what needed to happen, but wanted to check with [place] first. Will call back if needed.

## 2023-07-23 ENCOUNTER — Other Ambulatory Visit: Payer: Self-pay | Admitting: Family Medicine

## 2023-07-30 ENCOUNTER — Other Ambulatory Visit: Payer: Self-pay | Admitting: Family Medicine

## 2023-08-08 ENCOUNTER — Other Ambulatory Visit: Payer: Self-pay | Admitting: Family Medicine

## 2023-08-10 ENCOUNTER — Telehealth: Payer: Self-pay | Admitting: Family Medicine

## 2023-08-10 NOTE — Telephone Encounter (Signed)
 Nurses Please connect with Steven [name] I would like for him to do a follow-up visit August no later than September With myself He also needs to do lab work by June or July Lipid, liver, metabolic 7, A1c Hyperlipidemia, high risk medication, prediabetes He also has a birthday on [birth year] we wish him a happy birthday

## 2023-08-11 ENCOUNTER — Other Ambulatory Visit: Payer: Self-pay

## 2023-08-11 DIAGNOSIS — Z79899 Other long term (current) drug therapy: Secondary | ICD-10-CM

## 2023-08-11 DIAGNOSIS — E785 Hyperlipidemia, unspecified: Secondary | ICD-10-CM

## 2023-08-11 DIAGNOSIS — R7303 Prediabetes: Secondary | ICD-10-CM

## 2023-08-11 DIAGNOSIS — E7800 Pure hypercholesterolemia, unspecified: Secondary | ICD-10-CM

## 2023-08-11 DIAGNOSIS — I10 Essential (primary) hypertension: Secondary | ICD-10-CM

## 2023-09-28 ENCOUNTER — Other Ambulatory Visit: Payer: Self-pay | Admitting: Family Medicine

## 2023-09-28 DIAGNOSIS — I4819 Other persistent atrial fibrillation: Secondary | ICD-10-CM

## 2023-10-22 ENCOUNTER — Telehealth: Payer: Self-pay | Admitting: Family Medicine

## 2023-10-22 NOTE — Telephone Encounter (Signed)
 Refill on pantoprazole  (PROTONIX ) 40 MG tablet send to [facility] pharmacy

## 2023-10-23 MED ORDER — PANTOPRAZOLE SODIUM 40 MG PO TBEC: 40.0000 mg | DELAYED_RELEASE_TABLET | Freq: Every day | ORAL | 0 refills | Status: DC

## 2023-11-26 ENCOUNTER — Telehealth: Payer: Self-pay

## 2023-11-26 NOTE — Telephone Encounter (Signed)
 Patient has lab work which was ordered on 08/11/2023  This can be done through [facility] Have the patient reference this lab work that was ordered at that time thank you

## 2023-11-26 NOTE — Telephone Encounter (Signed)
 Communication  Reason for CRM: Wife inquiring if patient needs to come in to get lab work  before appointment next week on 12/02/23. Wife requesting call back, [phone]

## 2023-11-30 DIAGNOSIS — R7303 Prediabetes: Secondary | ICD-10-CM | POA: Diagnosis not present

## 2023-11-30 DIAGNOSIS — E785 Hyperlipidemia, unspecified: Secondary | ICD-10-CM | POA: Diagnosis not present

## 2023-11-30 DIAGNOSIS — E7800 Pure hypercholesterolemia, unspecified: Secondary | ICD-10-CM | POA: Diagnosis not present

## 2023-11-30 DIAGNOSIS — Z79899 Other long term (current) drug therapy: Secondary | ICD-10-CM | POA: Diagnosis not present

## 2023-11-30 DIAGNOSIS — I10 Essential (primary) hypertension: Secondary | ICD-10-CM | POA: Diagnosis not present

## 2023-12-01 ENCOUNTER — Ambulatory Visit: Payer: Self-pay | Admitting: Family Medicine

## 2023-12-01 LAB — BASIC METABOLIC PANEL WITH GFR
BUN/Creatinine Ratio: 17 (ref 10–24)
BUN: 19 mg/dL (ref 8–27)
CO2: 20 mmol/L (ref 20–29)
Calcium: 9.5 mg/dL (ref 8.6–10.2)
Chloride: 104 mmol/L (ref 96–106)
Creatinine, Ser: 1.09 mg/dL (ref 0.76–1.27)
Glucose: 110 mg/dL — ABNORMAL HIGH (ref 70–99)
Potassium: 4.5 mmol/L (ref 3.5–5.2)
Sodium: 139 mmol/L (ref 134–144)
eGFR: 68 mL/min/1.73 (ref >59)

## 2023-12-01 LAB — HEPATIC FUNCTION PANEL
ALT: 13 IU/L (ref 0–44)
AST: 17 IU/L (ref 0–40)
Albumin: 4 g/dL (ref 3.7–4.7)
Alkaline Phosphatase: 74 IU/L (ref 44–121)
Bilirubin Total: 0.7 mg/dL (ref 0.0–1.2)
Bilirubin, Direct: 0.24 mg/dL (ref 0.00–0.40)
Total Protein: 6.3 g/dL (ref 6.0–8.5)

## 2023-12-01 LAB — LIPID PANEL
Chol/HDL Ratio: 3.2 ratio (ref 0.0–5.0)
Cholesterol, Total: 126 mg/dL (ref 100–199)
HDL: 40 mg/dL (ref >39)
LDL Chol Calc (NIH): 69 mg/dL (ref 0–99)
Triglycerides: 88 mg/dL (ref 0–149)
VLDL Cholesterol Cal: 17 mg/dL (ref 5–40)

## 2023-12-01 LAB — HEMOGLOBIN A1C
Est. average glucose Bld gHb Est-mCnc: 126 mg/dL
Hgb A1c MFr Bld: 6 % — ABNORMAL HIGH (ref 4.8–5.6)

## 2023-12-02 ENCOUNTER — Ambulatory Visit: Admitting: Family Medicine

## 2023-12-02 VITALS — BP 129/84 | HR 74 | Temp 97.2°F | Ht 70.5 in | Wt 300.0 lb

## 2023-12-02 DIAGNOSIS — E7800 Pure hypercholesterolemia, unspecified: Secondary | ICD-10-CM

## 2023-12-02 DIAGNOSIS — G4733 Obstructive sleep apnea (adult) (pediatric): Secondary | ICD-10-CM

## 2023-12-02 DIAGNOSIS — I4819 Other persistent atrial fibrillation: Secondary | ICD-10-CM

## 2023-12-02 DIAGNOSIS — R011 Cardiac murmur, unspecified: Secondary | ICD-10-CM

## 2023-12-02 DIAGNOSIS — R7303 Prediabetes: Secondary | ICD-10-CM

## 2023-12-02 NOTE — Progress Notes (Signed)
   Subjective:    Patient ID: Steven [name], male    DOB: Oct 28, 1940, 83 y.o.   MRN: [id number]  HPI History of Present Illness   Steven [name] is an 83 year old male who presents for a routine follow-up and discussion of heart health and sleep concerns.   He has a yearly cardiology appointment scheduled for October 16th. He recalls a previous incident where he was unable to secure a timely appointment.  He discusses his dietary habits, noting a high intake of food, including homegrown potatoes. Recent lab work shows an A1c of 6.0. His cholesterol levels are within normal limits, with LDL at 69 and HDL at 40.  He often feels sleepy during the day, particularly after lunch, and experiences frequent nocturnal awakenings. He goes to bed around 6-7 PM, watching TV before sleep. He acknowledges snoring and has a history of sleep apnea. He previously used a CPAP machine but discontinued due to discomfort and is reluctant to resume its use.  He recalls a past hospitalization that significantly impacted his outlook on life, maintaining a positive attitude, often smiling and laughing.  He works three and a half days a week and is actively involved in his own care, utilizing MyChart for communication.        Review of Systems     Objective:   Physical Exam General-in no acute distress Eyes-no discharge Lungs-respiratory rate normal, CTA CV-moderate murmurs,RRR Extremities skin warm dry no edema Neuro grossly normal Behavior normal, alert        Assessment & Plan:  1. Persistent atrial fibrillation (HCC) (Primary) Under decent control has scheduled follow-up with Dr. Wonda in October Will do an up-to-date echo - ECHOCARDIOGRAM COMPLETE; Future  2. Hypercholesteremia Under good control continue current medication no side effects  3. Prediabetes Dietary measures as discussed try to stay as active as possible  4. Morbid obesity (HCC) Portion control regular physical  activity  5. Obstructive sleep apnea Referral for further evaluation patient has significant symptoms of sleep apnea - Ambulatory referral to Neurology  6. Heart murmur Echo ordered await results  Will do follow-up lab work and office visit 6 months

## 2023-12-02 NOTE — Patient Instructions (Signed)
 We will set you up consultation with the sleep specialist  we will set up that echo and share the results with Dr. Armanda will manage this particular issue when he sees you in October  Please do your flu shot late September or October either here or at the pharmacy  Follow-up here in 6 months  We will do lab work a couple weeks before that visit  Please take care-[name] [employer]

## 2023-12-16 ENCOUNTER — Other Ambulatory Visit: Payer: Self-pay

## 2023-12-16 DIAGNOSIS — E7800 Pure hypercholesterolemia, unspecified: Secondary | ICD-10-CM

## 2023-12-16 DIAGNOSIS — I10 Essential (primary) hypertension: Secondary | ICD-10-CM

## 2023-12-16 DIAGNOSIS — Z79899 Other long term (current) drug therapy: Secondary | ICD-10-CM

## 2023-12-17 ENCOUNTER — Ambulatory Visit: Admitting: Family Medicine

## 2023-12-17 DIAGNOSIS — Z961 Presence of intraocular lens: Secondary | ICD-10-CM | POA: Diagnosis not present

## 2023-12-17 DIAGNOSIS — H43813 Vitreous degeneration, bilateral: Secondary | ICD-10-CM | POA: Diagnosis not present

## 2023-12-17 DIAGNOSIS — H04123 Dry eye syndrome of bilateral lacrimal glands: Secondary | ICD-10-CM | POA: Diagnosis not present

## 2023-12-21 ENCOUNTER — Ambulatory Visit: Payer: Self-pay | Admitting: Family Medicine

## 2023-12-21 ENCOUNTER — Ambulatory Visit (HOSPITAL_COMMUNITY)
Admission: RE | Admit: 2023-12-21 | Discharge: 2023-12-21 | Disposition: A | Discharge status: Home / Self Care | Source: Ambulatory Visit | Attending: Family Medicine | Admitting: Family Medicine

## 2023-12-21 DIAGNOSIS — I4819 Other persistent atrial fibrillation: Secondary | ICD-10-CM

## 2023-12-21 DIAGNOSIS — R011 Cardiac murmur, unspecified: Secondary | ICD-10-CM

## 2023-12-21 LAB — ECHOCARDIOGRAM COMPLETE
AR max vel: 0.95 cm2
AV Area VTI: 0.86 cm2
AV Area mean vel: 0.93 cm2
AV Mean grad: 29 mmHg
AV Peak grad: 47.5 mmHg
Ao pk vel: 3.45 m/s
Area-P 1/2: 2.56 cm2
MV M vel: 5.9 m/s
MV Peak grad: 139.2 mmHg
S' Lateral: 2.3 cm

## 2023-12-21 NOTE — Progress Notes (Signed)
*  PRELIMINARY RESULTS* Echocardiogram 2D Echocardiogram has been performed.  Steven [name] 12/21/2023, 9:36 AM

## 2023-12-25 ENCOUNTER — Encounter: Payer: Self-pay | Admitting: Cardiovascular Disease

## 2024-01-04 ENCOUNTER — Ambulatory Visit: Admitting: Neurology

## 2024-01-04 ENCOUNTER — Encounter: Payer: Self-pay | Admitting: Neurology

## 2024-01-04 VITALS — BP 155/95 | HR 79 | Ht 70.0 in | Wt 301.0 lb

## 2024-01-04 DIAGNOSIS — R400 Somnolence: Secondary | ICD-10-CM | POA: Diagnosis not present

## 2024-01-04 DIAGNOSIS — Z9189 Other specified personal risk factors, not elsewhere classified: Secondary | ICD-10-CM | POA: Diagnosis not present

## 2024-01-04 DIAGNOSIS — G40109 Localization-related (focal) (partial) symptomatic epilepsy and epileptic syndromes with simple partial seizures, not intractable, without status epilepticus: Secondary | ICD-10-CM | POA: Diagnosis not present

## 2024-01-04 NOTE — Patient Instructions (Signed)
 Quality Sleep Information, Adult Quality sleep is important for your mental and physical health. It also improves your quality of life. Quality sleep means you: Are asleep for most of the time you are in bed. Fall asleep within 30 minutes. Wake up no more than once a night. Are awake for no longer than 20 minutes if you do wake up during the night. Most adults need 7-8 hours of quality sleep each night. How can poor sleep affect me? If you do not get enough quality sleep, you may have: Mood swings. Daytime sleepiness. Decreased alertness, reaction time, and concentration. Sleep disorders, such as insomnia and sleep apnea. Difficulty with: Solving problems. Coping with stress. Paying attention. These issues may affect your performance and productivity at work, school, and home. Lack of sleep may also put you at higher risk for accidents, suicide, and risky behaviors. If you do not get quality sleep, you may also be at higher risk for several health problems, including: Infections. Type 2 diabetes. Heart disease. High blood pressure. Obesity. Worsening of long-term conditions, like arthritis, kidney disease, depression, Parkinson's disease, and epilepsy. What actions can I take to get more quality sleep? Sleep schedule and routine Stick to a sleep schedule. Go to sleep and wake up at about the same time each day. Do not try to sleep less on weekdays and make up for lost sleep on weekends. This does not work. Limit naps during the day to 30 minutes or less. Do not take naps in the late afternoon. Make time to relax before bed. Reading, listening to music, or taking a hot bath promotes quality sleep. Make your bedroom a place that promotes quality sleep. Keep your bedroom dark, quiet, and at a comfortable room temperature. Make sure your bed is comfortable. Avoid using electronic devices that give off bright blue light for 30 minutes before bedtime. Your brain perceives bright blue light  as sunlight. This includes television, phones, and computers. If you are lying awake in bed for longer than 20 minutes, get up and do a relaxing activity until you feel sleepy. Lifestyle     Try to get at least 30 minutes of exercise on most days. Do not exercise 2-3 hours before going to bed. Do not use any products that contain nicotine or tobacco. These products include cigarettes, chewing tobacco, and vaping devices, such as e-cigarettes. If you need help quitting, ask your health care provider. Do not drink caffeinated beverages for at least 8 hours before going to bed. Coffee, tea, and some sodas contain caffeine. Do not drink alcohol or eat large meals close to bedtime. Try to get at least 30 minutes of sunlight every day. Morning sunlight is best. Medical concerns Work with your health care provider to treat medical conditions that may affect sleeping, such as: Nasal obstruction. Snoring. Sleep apnea and other sleep disorders. Talk to your health care provider if you think any of your prescription medicines may cause you to have difficulty falling or staying asleep. If you have sleep problems, talk with a sleep consultant. If you think you have a sleep disorder, talk with your health care provider about getting evaluated by a specialist. Where to find more information Sleep Foundation: sleepfoundation.org American Academy of Sleep Medicine: aasm.org Centers for Disease Control and Prevention (CDC): TonerPromos.no Contact a health care provider if: You have trouble getting to sleep or staying asleep. You often wake up very early in the morning and cannot get back to sleep. You have daytime sleepiness. You  have daytime sleep attacks of suddenly falling asleep and sudden muscle weakness (narcolepsy). You have a tingling sensation in your legs with a strong urge to move your legs (restless legs syndrome). You stop breathing briefly during sleep (sleep apnea). You think you have a sleep  disorder or are taking a medicine that is affecting your quality of sleep. Summary Most adults need 7-8 hours of quality sleep each night. Getting enough quality sleep is important for your mental and physical health. Make your bedroom a place that promotes quality sleep, and avoid things that may cause you to have poor sleep, such as alcohol, caffeine, smoking, or large meals. Talk to your health care provider if you have trouble falling asleep or staying asleep. This information is not intended to replace advice given to you by your health care provider. Make sure you discuss any questions you have with your health care provider. Document Revised: 07/24/2021 Document Reviewed: 07/24/2021 Elsevier Patient Education  2024 Elsevier Inc.Healthy Living: Sleep In this video, you will learn why sleep is an important part of a healthy lifestyle. To view the content, go to this web address: https://pe.elsevier.com/[email or web address]  This video will expire on: 03/25/2025. If you need access to this video following this date, please reach out to the healthcare provider who assigned it to you. This information is not intended to replace advice given to you by your health care provider. Make sure you discuss any questions you have with your health care provider. Elsevier Patient Education  2024 [employer].

## 2024-01-04 NOTE — Progress Notes (Signed)
 @[facility]   Provider:  Dedra Gores, MD  Primary Care Physician:  Steven Glendia LABOR, MD [address] [place] [address]   Primary Neurologist ; Steven [name]  Referring Provider: Alphonsa Glendia LABOR, Md [address] [place],  [place] [address]        Chief Concern for this Consultation:   Patient presents with          HPI: I have the pleasure of meeting with Steven [name] , on 01-04-2024, who is a 83 y.o.  male patient of Steven Sharion and seen her for  epilepsy, he has atrial fibrillation,  seen upon a referral by Steven Steven  for Sleep Medicine Consultation.  The patient's referral information asked for a re-evaluation of OSA. 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   Chief concern according to patient:  He  presented with a medical history of  Past Medical History:  Diagnosis Date   Chronic pain    Complication of anesthesia    pt woke up during last surgery    Coronary artery disease 02-27-11   Steven. Wonda slain follows-has some blocked coronary arteries ,not suitable for stent  placement   Dyslipidemia    Dysrhythmia    hx of atrial fib during last hospitalization    GERD (gastroesophageal reflux disease) 02-27-11   Acid reflux   Hearing loss 02-27-11   Bilateral hearing aids-due to exposure to loud machinery   Hemorrhoids 02-27-11   not bothersome at this time   Hyperlipidemia    Hypertension    Leg swelling    Obesity    Osteoarthritis 02-27-11   Ostearthritis-knees, shoulders.Back causes chronic pain-radiates down right leg   Rash    Seizures (HCC) 07/2014   Shingles outbreak 02-27-11   2 weeks ago , was tx.-only residual is tenderness of right scalp-no open areas   Shortness of breath    with exertion    Urinary incontinence 02-27-11   not an everday occurrence-no special measures     has a past medical history of Chronic pain, Complication of anesthesia, Coronary artery disease (02-27-11), Dyslipidemia, Dysrhythmia, GERD  (gastroesophageal reflux disease) (02-27-11), Hearing loss (02-27-11), Hemorrhoids (02-27-11), Hyperlipidemia, Hypertension, Leg swelling, Obesity, Osteoarthritis (02-27-11), Rash, Seizures (HCC) (07/2014), Shingles outbreak (02-27-11), Shortness of breath, and Urinary incontinence (02-27-11). Sleep relevant medical/ surgical and symptom history: non refreshing sleep, not restored in AM.  The patient reports onset of symptoms over a time period of years.  See ROS.  No ENT surgery  but hearing loss, no Trauma such as TBI/ whiplash, had GERD,  no Thyroid  disease or other endocrinological disorders, no Substance Abuse This patient had a previous sleep study/ studies in the year 2014 (?) at [facility] with a resulting diagnosis of OSA , severe . This patient has used the following therapies: CPAP-  used it for several years, now off for several years.   Family medical history: There are no biological family members affected by Sleep apnea ( ), or Insomnia () , by excessive daytime sleepiness ().     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  Family status is married , with adult children, several grandchildren.  The workplace involves physical activity, outdoor activity.  Nicotine use: chew, quit 20 years ago.  ETOH use: none,  Caffeine intake in form of: Coffee (/), Soft drinks (Steven  pepper / 2-3  daily ), Tea ( /) or Energy drinks ( including those containing  taurine ). Caffeine is last consumed at 7 PM.      Sleep habits and routines are as follows: The patient's dinner time is around 5=6 PM.  Evening time is spent by TV watching . The patient goes to bed at, or close to, 7-8 PM. The bedroom is shared with spouse  and is described as cool, quiet, and dark.  He falls asleep with the TV on.  The patient reports that it takes 10 minutes to fall asleep, then continues to sleep for 6-7 hours, woken up by the need to void  (Nocturia).   The preferred sleep position is laterally with support of 2-3 pillows, (adjustable bed/ recliner ) ,  20 degrees,  The total estimated sleep time is circa 6-7 hours.  Dreams are reportedly  very frequent/ and can be vivid. Dream enactment has not been reported.   6 AM is the usual week- day rise time. The patient wakes up spontaneously.  He reports not feeling refreshed and restored in the morning, waking with symptoms such as dry mouth, morning headaches, neck stiffness  and fatigue.  No sleep paralysis has been experienced.  Naps in daytime are taken frequently (there is a desire to nap and opportunity), lasting from 30 to 60 min. and have a refreshing quality. These do not interfere with nocturnal sleep.    Review of Systems: Out of a complete 14 system review, the patient complains of only the following symptoms, and all other reviewed systems are negative.:  Hypersomnia, fatigue,    Pain: stiff   Headaches : in AM     Loud Snoring,  TV on, lights on.   Sleep fragmentation, Nocturia   How likely are you to doze in the following situations: 0 = not likely, 1 = slight chance, 2 = moderate chance, 3 = high chance Sitting and Reading? Watching Television? Sitting inactive in a public place (theater or meeting)? As a passenger in a car for an hour without a break? Lying down in the afternoon when circumstances permit? Sitting and talking to someone? Sitting quietly after lunch without alcohol? In a car, while stopped for a few minutes in traffic?   Total ESS =12 / 24 points.    FSS endorsed at 39/ 63 points.  GDS:  3/ 15  Social History   Socioeconomic History   Marital status: Married    Spouse name: Not on file   Number of children: 3   Years of education: 12   Highest education level: Not on file  Occupational History   Occupation: Retired    Associate Professor: RETIRED  Tobacco Use   Smoking status: Former   Smokeless tobacco: Former    Types: Chew    Quit date:  01/27/1973   Tobacco comments:    chewing   Vaping Use   Vaping status: Never Used  Substance and Sexual Activity   Alcohol use: No   Drug use: No   Sexual activity: Yes    Birth control/protection: None  Other Topics Concern   Not on file  Social History Narrative   Lives at home w/ his wife   Right-handed   Caffeine: 2 cups/day   Social Drivers of Corporate investment banker Strain: Low Risk  (05/15/2023)   Overall Financial Resource Strain (CARDIA)    Difficulty of Paying Living Expenses: Not hard at all  Food Insecurity: No Food Insecurity (  05/15/2023)   Hunger Vital Sign    Worried About Running Out of Food in the Last Year: Never true    Ran Out of Food in the Last Year: Never true  Transportation Needs: No Transportation Needs (05/15/2023)   PRAPARE - Administrator, Civil Service (Medical): No    Lack of Transportation (Non-Medical): No  Physical Activity: Inactive (05/15/2023)   Exercise Vital Sign    Days of Exercise per Week: 0 days    Minutes of Exercise per Session: 0 min  Stress: No Stress Concern Present (05/15/2023)   [employer] of Occupational Health - Occupational Stress Questionnaire    Feeling of Stress : Not at all  Social Connections: Socially Integrated (05/15/2023)   Social Connection and Isolation Panel    Frequency of Communication with Friends and Family: More than three times a week    Frequency of Social Gatherings with Friends and Family: More than three times a week    Attends Religious Services: More than 4 times per year    Active Member of [employer] or Organizations: Yes    Attends Engineer, structural: More than 4 times per year    Marital Status: Married    Family History  Problem Relation Age of Onset   Heart attack Father        mi I N HIS 40'S BUT LIVED INTO HIS 90'S WITH COPD   COPD Father    Cancer Father        colon   Other Mother 56       died multiple med problems   Heart disease Mother     Hypertension Mother    Heart attack Sister 46   Seizures Neg Hx    Transient ischemic attack Neg Hx    Stroke Neg Hx     Past Medical History:  Diagnosis Date   Chronic pain    Complication of anesthesia    pt woke up during last surgery    Coronary artery disease 02-27-11   Steven. Wonda slain follows-has some blocked coronary arteries ,not suitable for stent  placement   Dyslipidemia    Dysrhythmia    hx of atrial fib during last hospitalization    GERD (gastroesophageal reflux disease) 02-27-11   Acid reflux   Hearing loss 02-27-11   Bilateral hearing aids-due to exposure to loud machinery   Hemorrhoids 02-27-11   not bothersome at this time   Hyperlipidemia    Hypertension    Leg swelling    Obesity    Osteoarthritis 02-27-11   Ostearthritis-knees, shoulders.Back causes chronic pain-radiates down right leg   Rash    Seizures (HCC) 07/2014   Shingles outbreak 02-27-11   2 weeks ago , was tx.-only residual is tenderness of right scalp-no open areas   Shortness of breath    with exertion    Urinary incontinence 02-27-11   not an everday occurrence-no special measures    Past Surgical History:  Procedure Laterality Date   CHOLECYSTECTOMY  03/14/2011   Procedure: LAPAROSCOPIC CHOLECYSTECTOMY;  Surgeon: Morene ONEIDA Olives, MD;  Location: [facility];  Service: General;  Laterality: N/A;   COLONOSCOPY     INJECTION KNEE  03/05/2011   Procedure: KNEE INJECTION;  Surgeon: Dempsey LULLA Moan;  Location: [facility];  Service: Orthopedics;  Laterality: Left;  80 mg depomedrol   LAPAROSCOPY  03/16/2011   Procedure: LAPAROSCOPY DIAGNOSTIC;  Surgeon: Redell Alm Faith, DO;  Location: [facility];  Service: General;  Laterality: N/A;  repair incisional hernia   REPLACEMENT TOTAL KNEE  02-27-11   right- 2007   TOTAL KNEE ARTHROPLASTY  03/01/2012   Procedure: TOTAL KNEE ARTHROPLASTY;  Surgeon: Dempsey LULLA Moan, MD;  Location: [facility];  Service: Orthopedics;  Laterality: Left;   TOTAL KNEE  REVISION  03/05/2011   Procedure: TOTAL KNEE REVISION;  Surgeon: Dempsey LULLA Aluisio;  Location: [facility];  Service: Orthopedics;  Laterality: Right;     Current Outpatient Medications on File Prior to Visit  Medication Sig Dispense Refill   atorvastatin  (LIPITOR) 80 MG tablet Take 1 tablet (80 mg total) by mouth daily. 90 tablet 3   cholecalciferol (VITAMIN D) 1000 UNITS tablet Take 1,000 Units by mouth daily.     Cyanocobalamin (VITAMIN B-12 PO) Take 1,000 mg by mouth daily.     DULoxetine  (CYMBALTA ) 60 MG capsule TAKE ONE CAPSULE BY MOUTH DAILY 90 capsule 1   ELIQUIS  5 MG TABS tablet TAKE (1) TABLET BY MOUTH TWICE DAILY. 60 tablet 5   lisinopril  (ZESTRIL ) 2.5 MG tablet Take 1 tablet (2.5 mg total) by mouth daily. 90 tablet 3   Omega-3 Fatty Acids (FISH OIL) 1000 MG CAPS Take 1,000 mg by mouth daily.     pantoprazole  (PROTONIX ) 40 MG tablet Take 1 tablet (40 mg total) by mouth daily. 90 tablet 0   Probiotic Product (ALIGN PO) Take by mouth every morning.     tamsulosin  (FLOMAX ) 0.4 MG CAPS capsule Take 1 capsule (0.4 mg total) by mouth daily. 90 capsule 3   zonisamide  (ZONEGRAN ) 100 MG capsule TAKE (2) CAPSULES BY MOUTH ONCE DAILY. 180 capsule 4   No current facility-administered medications on file prior to visit.    Allergies  Allergen Reactions   Ciprofloxacin Rash   Oxycodone-Acetaminophen  Rash and Other (See Comments)    Felt like he was hot   Oxycodone-Acetaminophen  Rash    Vitals:   01/04/24 0846  BP: (!) 155/95  Pulse: 79      Physical exam:   General: The patient was alert and appears not in acute distress.  Mood and affect are appropriate .  The patient's interactions are: Cooperative, makes eye contact, follows the instructions and answers questions coherently.  The patient is groomed and appropriately groomed and dressed. Head: Normocephalic, atraumatic.  Neck is supple. Mallampati: 4.  The neck circumference measured 18.5 inches. Nasal airflow was not patent ,    Overbite / Retrognathia was noted.  Dental status: biological and bridge  Cardiovascular:  Regular rate and cardiac rhythm by palpable pulse. Respiratory: no audible wheezing, no tachypnoea.   Skin:  Without evidence of ankle edema. No discoloration.  Trunk:  BMI of  43.2 The patient's posture was erect.   Neurologic exam : The patient was awake and alert, oriented to place and time.   Attention span & concentration ability appeared normal.  Speech was fluent, without dysarthria, mild dysphonia or aphasia, and of normal volume.     Cranial nerves:  There was no loss of smell or taste reported  Pupils are round, equal in size and briskly reactive to light.  Funduscopic exam was deferred.  Extraocular movements in vertical and horizontal planes were intact and without nystagmus. (No Diplopia reported). Visual fields by finger perimetry are intact. Hearing was impaired ,   Facial sensation intact to fine touch.  Facial motor strength: Symmetric movement and tongue midline.  He drools at night.   Neck ROM: rotation, tilt and flexion extension were intact for age and shoulder shrug  was symmetrical.    Motor exam:  Symmetric bulk, strength and ROM.   Normal tone without cog- wheeling, and symmetric grip strength.   Sensory:  Fine touch and vibration were tested by tuning fork and intact.  Proprioception tested in the upper extremities was normal.   Coordination: The patient reported no problems with button closure and no changes to penmanship.   The Finger-to-nose maneuver was intact without evidence of ataxia, dysmetria or tremor.   Gait and station: Patient could rise unassisted from a seated position,The patient's gait posture was stooped.   Deep tendon reflexes: Upper extremities did show symmetric DTRs. Lower extremity DTRs were symmetric and attenuated.   I would like to thank Steven Glendia LABOR, Md [address] [place],  [place] [address] for allowing me to meet with  This pleasant  gentleman.     I suspect that Steven Althaus will still have OSA, may be CSA too.    Risk factors for OSA were present,  including : Body mass index is 43.19 kg/m., almost 19  neck size  and upper airway anatomy. He has retrognathia.  Atrial fib is present.    My Plan is to proceed with:HST  to screen and define apnea degree.    I plan to follow up personally within the next 4-6 months or through out NP. SABRA   A total time of  45  minutes consistent of a part of face to face encounter , exam and interview,  and additional preparation time for chart review was spent .  At today's visit, we discussed treatment options, associated risk and benefits, and engage in counseling as needed including, but not limited to:  Sleep hygiene, Quality Sleep Habits, and Safety concerns for patients with daytime sleepiness who are warned to not operate machinery/ motor vehicles when drowsy.Additionally, the following were reviewed: Past medical records, past medical and surgical history, family and social background, as well as relevant laboratory results, imaging findings, and medical notes, where applicable.  This note was generated by myself in part by using dictation software, and as a result, it may contain unintentional typos and errors.  Nevertheless, effort was made to accurately convey the pertinent aspects of the patient's visit.   Steven Gores, MD   [facility] Sleep Board certified in Sleep Medicine by The [employer] of Sleep Medicine and Diplomate of the [employer] of Sleep Medicine (AASM) . Board certified In Neurology, Diplomat of the ABPN,  Fellow of the [employer] of Neurology.

## 2024-01-05 ENCOUNTER — Telehealth: Payer: Self-pay | Admitting: Neurology

## 2024-01-05 NOTE — Telephone Encounter (Signed)
 HST- HTA pending

## 2024-01-13 NOTE — Telephone Encounter (Signed)
 HST HTA shara: [id number] (exp. 01/05/24 to 04/04/24)

## 2024-01-14 ENCOUNTER — Ambulatory Visit: Attending: Cardiovascular Disease | Admitting: Cardiovascular Disease

## 2024-01-14 ENCOUNTER — Encounter: Payer: Self-pay | Admitting: Cardiovascular Disease

## 2024-01-14 VITALS — BP 110/82 | HR 65 | Ht 70.0 in | Wt 300.2 lb

## 2024-01-14 DIAGNOSIS — I2510 Atherosclerotic heart disease of native coronary artery without angina pectoris: Secondary | ICD-10-CM | POA: Diagnosis not present

## 2024-01-14 DIAGNOSIS — I4819 Other persistent atrial fibrillation: Secondary | ICD-10-CM | POA: Diagnosis not present

## 2024-01-14 DIAGNOSIS — I25118 Atherosclerotic heart disease of native coronary artery with other forms of angina pectoris: Secondary | ICD-10-CM | POA: Diagnosis not present

## 2024-01-14 DIAGNOSIS — Z6841 Body Mass Index (BMI) 40.0 and over, adult: Secondary | ICD-10-CM | POA: Diagnosis not present

## 2024-01-14 DIAGNOSIS — Z01812 Encounter for preprocedural laboratory examination: Secondary | ICD-10-CM | POA: Diagnosis not present

## 2024-01-14 DIAGNOSIS — I350 Nonrheumatic aortic (valve) stenosis: Secondary | ICD-10-CM

## 2024-01-14 NOTE — Assessment & Plan Note (Signed)
 Good rate control, no change.  Continue apixaban .

## 2024-01-14 NOTE — Progress Notes (Signed)
 Cardiology Office Note:    Date:  01/14/2024   ID:  Steven [name], DOB 10-26-1940, MRN [id number]  PCP:  Alphonsa Glendia LABOR, MD   [facility] Providers Cardiologist:  [name], MD     Referring MD: Alphonsa Glendia LABOR, MD   Chief Complaint  Patient presents with   Shortness of Breath    History of Present Illness:    Steven [name] is a 83 y.o. male with a hx of coronary artery disease, aortic stenosis, and permanent atrial fibrillation, presenting for follow-up evaluation.  The patient is here with his wife today.  He has been having more problems with shortness of breath and fatigue.  He also has had issues with lightheadedness.  He has longstanding shortness of breath over time felt to be related to obesity and deconditioning.  However, his breathing has gotten worse in the past year and he is now short of breath with low-level activities.  He denies orthopnea, PND, or leg swelling.  He has had a few times with near syncope where he has been on his riding mower or doing other activities.  He has not had frank syncope.  He has occasional left-sided chest discomfort but nothing consistently related to physical activity.  The patient has been followed for moderate aortic stenosis.  He recently had an echocardiogram demonstrated an LVEF of 70 to 75% with hyperdynamic function and no regional wall motion abnormalities, normal RV function, mild mitral regurgitation, severe mitral annular calcification with no mitral stenosis, and severe calcification and restriction of the aortic valve leaflets with severe paradoxical low-flow low gradient aortic stenosis.  Mean gradient is 29 mmHg, V-max 3.45, dimensionless index 0.3, and stroke-volume index 27 with calculated aortic valve area 0.86.   Current Medications: Current Meds  Medication Sig   atorvastatin  (LIPITOR) 80 MG tablet Take 1 tablet (80 mg total) by mouth daily.   cholecalciferol (VITAMIN D) 1000 UNITS tablet Take 1,000 Units by  mouth daily.   Cyanocobalamin (VITAMIN B-12 PO) Take 1,000 mg by mouth daily.   DULoxetine  (CYMBALTA ) 60 MG capsule TAKE ONE CAPSULE BY MOUTH DAILY   ELIQUIS  5 MG TABS tablet TAKE (1) TABLET BY MOUTH TWICE DAILY.   lisinopril  (ZESTRIL ) 2.5 MG tablet Take 1 tablet (2.5 mg total) by mouth daily.   Omega-3 Fatty Acids (FISH OIL) 1000 MG CAPS Take 1,000 mg by mouth daily.   pantoprazole  (PROTONIX ) 40 MG tablet Take 1 tablet (40 mg total) by mouth daily.   Probiotic Product (ALIGN PO) Take by mouth every morning.   tamsulosin  (FLOMAX ) 0.4 MG CAPS capsule Take 1 capsule (0.4 mg total) by mouth daily.   zonisamide  (ZONEGRAN ) 100 MG capsule TAKE (2) CAPSULES BY MOUTH ONCE DAILY.     Allergies:   Ciprofloxacin, Oxycodone-acetaminophen , and Oxycodone-acetaminophen    ROS:   Please see the history of present illness.    Positive for bilateral knee pain.  All other systems reviewed and are negative.  EKGs/Labs/Other Studies Reviewed:    The following studies were reviewed today: Cardiac Studies & Procedures   ______________________________________________________________________________________________   STRESS TESTS  MYOCARDIAL PERFUSION IMAGING 09/22/2017  Interpretation Summary  Nuclear stress EF: 64%. The left ventricular ejection fraction is normal (55-65%).  The study is normal.  This is a low risk study. no ischemia. no evidence of infarction   ECHOCARDIOGRAM  ECHOCARDIOGRAM COMPLETE 12/21/2023  Narrative ECHOCARDIOGRAM REPORT    Patient Name:   Steven [name] Date of Exam: 12/21/2023 Medical Rec #:  [id number]     Height:       70.5 in Accession #:    [id number]    Weight:       300.0 lb Date of Birth:  07-Jun-1940      BSA:          2.493 m Patient Age:    83 years      BP:           123/84 mmHg Patient Gender: M             HR:           81 bpm. Exam Location:  [facility]  Procedure: 2D Echo, Cardiac Doppler and Color Doppler (Both Spectral and Color Flow Doppler were  utilized during procedure).  Indications:    Atrial Fibrillation-Persistent atrial fibrillation  History:        Patient has prior history of Echocardiogram examinations, most recent 02/03/2023. CAD, Arrythmias:Atrial Fibrillation; Risk Factors:Dyslipidemia, Hypertension and Prediabetes.  Sonographer:    Aida Pizza RCS Referring Phys: [phone] SCOTT A LUKING  IMPRESSIONS   1. Left ventricular ejection fraction, by estimation, is 70 to 75%. The left ventricle has hyperdynamic function. The left ventricle has no regional wall motion abnormalities. There is moderate asymmetric left ventricular hypertrophy of the basal and septal segments. Left ventricular diastolic function could not be evaluated. 2. Right ventricular systolic function is normal. The right ventricular size is normal. Tricuspid regurgitation signal is inadequate for assessing PA pressure. 3. Left atrial size was severely dilated. 4. Right atrial size was severely dilated. 5. The mitral valve is abnormal. Mild mitral valve regurgitation. No evidence of mitral stenosis. Severe mitral annular calcification. 6. The aortic valve is tricuspid. There is severe calcifcation of the aortic valve. There is severe thickening of the aortic valve. Severe restriction of aortic valve leaflets. Aortic valve regurgitation is moderate. Paradoxical low flow-low gradient severe aortic valve stenosis. Stroke volume index is 27.27 (normal in 01/2023). Aortic valve area, by VTI measures 0.86 cm. Aortic valve mean gradient measures 29.0 mmHg. Aortic valve Vmax measures 3.45 m/s. DVI is 0.30. 7. The inferior vena cava is normal in size with greater than 50% respiratory variability, suggesting right atrial pressure of 3 mmHg.  Comparison(s): Changes from prior study are noted. Pardoxical low flow-low gradient severe AS with reduced SVI 27.27 now compared to moderate AS with normal SVI in 2024.  FINDINGS Left Ventricle: Left ventricular ejection  fraction, by estimation, is 70 to 75%. The left ventricle has hyperdynamic function. The left ventricle has no regional wall motion abnormalities. Strain was performed and the global longitudinal strain is indeterminate. The left ventricular internal cavity size was normal in size. There is moderate asymmetric left ventricular hypertrophy of the basal and septal segments. Left ventricular diastolic function could not be evaluated due to atrial fibrillation. Left ventricular diastolic function could not be evaluated.  Right Ventricle: The right ventricular size is normal. No increase in right ventricular wall thickness. Right ventricular systolic function is normal. Tricuspid regurgitation signal is inadequate for assessing PA pressure.  Left Atrium: Left atrial size was severely dilated.  Right Atrium: Right atrial size was severely dilated.  Pericardium: There is no evidence of pericardial effusion.  Mitral Valve: The mitral valve is abnormal. Severe mitral annular calcification. Mild mitral valve regurgitation. No evidence of mitral valve stenosis.  Tricuspid Valve: The tricuspid valve is normal in structure. Tricuspid valve regurgitation is not demonstrated. No evidence of tricuspid stenosis.  Aortic Valve: The aortic  valve is tricuspid. There is severe calcifcation of the aortic valve. There is severe thickening of the aortic valve. Aortic valve regurgitation is moderate. Moderate to severe aortic stenosis is present. Aortic valve mean gradient measures 29.0 mmHg. Aortic valve peak gradient measures 47.5 mmHg. Aortic valve area, by VTI measures 0.86 cm.  Pulmonic Valve: The pulmonic valve was normal in structure. Pulmonic valve regurgitation is not visualized. No evidence of pulmonic stenosis.  Aorta: The aortic root is normal in size and structure.  Venous: The inferior vena cava is normal in size with greater than 50% respiratory variability, suggesting right atrial pressure of 3  mmHg.  IAS/Shunts: No atrial level shunt detected by color flow Doppler.  Additional Comments: 3D was performed not requiring image post processing on an independent workstation and was indeterminate.   LEFT VENTRICLE PLAX 2D LVIDd:         4.50 cm LVIDs:         2.30 cm LV PW:         1.30 cm LV IVS:        1.10 cm LVOT diam:     2.00 cm LV SV:         68 LV SV Index:   27 LVOT Area:     3.14 cm   RIGHT VENTRICLE RV S prime:     11.60 cm/s TAPSE (M-mode): 2.1 cm  LEFT ATRIUM              Index        RIGHT ATRIUM           Index LA diam:        5.70 cm  2.29 cm/m   RA Area:     30.70 cm LA Vol (A2C):   131.0 ml 52.55 ml/m  RA Volume:   114.00 ml 45.73 ml/m LA Vol (A4C):   151.0 ml 60.57 ml/m LA Biplane Vol: 152.0 ml 60.97 ml/m AORTIC VALVE AV Area (Vmax):    0.95 cm AV Area (Vmean):   0.93 cm AV Area (VTI):     0.86 cm AV Vmax:           344.75 cm/s AV Vmean:          249.000 cm/s AV VTI:            0.792 m AV Peak Grad:      47.5 mmHg AV Mean Grad:      29.0 mmHg LVOT Vmax:         104.00 cm/s LVOT Vmean:        73.800 cm/s LVOT VTI:          0.218 m LVOT/AV VTI ratio: 0.28  AORTA Ao Root diam: 3.80 cm  MITRAL VALVE MV Area (PHT): 2.56 cm     SHUNTS MV Decel Time: 296 msec     Systemic VTI:  0.22 m MR Peak grad: 139.2 mmHg    Systemic Diam: 2.00 cm MR Mean grad: 83.0 mmHg MR Vmax:      590.00 cm/s MR Vmean:     425.0 cm/s MV E velocity: 132.00 cm/s  Steven [name] Electronically signed by Diannah Late [name] Signature Date/Time: 12/21/2023/3:57:37 PM    Final    MONITORS  LONG TERM MONITOR (3-14 DAYS) 05/02/2019  Narrative 1.  The basic rhythm is atrial fibrillation with an average ventricular rate of 75 bpm, atrial fibrillation burden 100% 2.  Occasional wide-complex beats, PVCs versus aberrancy 3.  No high-grade heart block  or pathologic pauses greater than 3 seconds        ______________________________________________________________________________________________      EKG:   EKG Interpretation Date/Time:  Thursday January 14 2024 08:48:44 EDT Ventricular Rate:  74 PR Interval:    QRS Duration:  88 QT Interval:  386 QTC Calculation: 428 R Axis:   34  Text Interpretation: Atrial fibrillation with a competing junctional pacemaker When compared with ECG of 15-Jan-2023 10:26, Criteria for Anterior infarct are no longer Present T wave inversion no longer evident in Inferior leads Confirmed by Wonda Sharper [phone]) on 01/14/2024 8:58:49 AM    Recent Labs: 11/30/2023: ALT 13; BUN 19; Creatinine, Ser 1.09; Potassium 4.5; Sodium 139  Recent Lipid Panel    Component Value Date/Time   CHOL 126 11/30/2023 0820   TRIG 88 11/30/2023 0820   HDL 40 11/30/2023 0820   CHOLHDL 3.2 11/30/2023 0820   CHOLHDL 3.4 07/20/2015 0435   VLDL 19 07/20/2015 0435   LDLCALC 69 11/30/2023 0820     Risk Assessment/Calculations:    CHA2DS2-VASc Score =     This indicates a  % annual risk of stroke. The patient's score is based upon:                Physical Exam:    VS:  BP 110/82 (BP Location: Left Arm, Patient Position: Sitting, Cuff Size: Large)   Pulse 65   Ht 5' 10 (1.778 m)   Wt (!) 300 lb 3.2 oz (136.2 kg)   SpO2 98%   BMI 43.07 kg/m     Wt Readings from Last 3 Encounters:  01/14/24 (!) 300 lb 3.2 oz (136.2 kg)  01/04/24 (!) 301 lb (136.5 kg)  12/02/23 300 lb (136.1 kg)     GEN: Pleasant obese male in no acute distress HEENT: Normal NECK: No JVD; No carotid bruits LYMPHATICS: No lymphadenopathy CARDIAC: Irregularly irregular with a 3/6 harsh crescendo decrescendo murmur at the right upper sternal border RESPIRATORY:  Clear to auscultation without rales, wheezing or rhonchi  ABDOMEN: Soft, non-tender, non-distended MUSCULOSKELETAL:  No edema; No deformity  SKIN: Warm and dry NEUROLOGIC:  Alert and oriented x 3 PSYCHIATRIC:  Normal affect    Assessment & Plan Persistent atrial fibrillation (HCC) Good rate control, no change.  Continue apixaban . Nonrheumatic aortic valve stenosis The patient has developed severe, symptomatic, stage D3 (paradoxical low-flow low gradient) aortic stenosis.  I have personally reviewed his echo images which confirms severe calcification and restriction of the aortic valve leaflets with Doppler parameters outlined above including low stroke-volume index with calculated aortic valve area of approximately 0.8 cm.  His progressive clinical symptoms and echo findings suggest severe symptomatic aortic stenosis.  We discussed the natural history of aortic stenosis and potential treatment options including palliative medical therapy, surgical aortic valve replacement, or transcatheter aortic valve replacement.  The patient would be an appropriate candidate for transcatheter aortic valve replacement at his advanced age of 83 years old and reduced functional capacity especially in the context of morbid obesity with BMI greater than 40.  He understands the workup will need to include right and left heart catheterization as well as CTA studies of the chest, abdomen, and pelvis.  He will also undergo a gated CTA of the heart to assess for TAVR anatomy.  Once his studies are completed, his case will be reviewed by our multidisciplinary heart valve team and he will be referred for formal cardiac surgical consultation as part of a multidisciplinary approach to his care. Coronary  artery disease of native artery of native heart with stable angina pectoris Some angina reported.  Will assess his coronary anatomy at the time of cardiac catheterization.  Previous heart catheterization report from 2011 reviewed when he was noted to have distal RCA territory and left posterolateral disease with recommendations for medical therapy. BMI 40.0-44.9, adult (HCC) Extensive weight loss counseling has been done over the years without  success Pre-procedure lab exam Check precardiac catheterization labs       Informed Consent   Shared Decision Making/Informed Consent The risks [stroke (1 in 1000), death (1 in 1000), kidney failure [usually temporary] (1 in 500), bleeding (1 in 200), allergic reaction [possibly serious] (1 in 200)], benefits (diagnostic support and management of coronary artery disease) and alternatives of a cardiac catheterization were discussed in detail with Mr. Suit and he is willing to proceed.       Medication Adjustments/Labs and Tests Ordered: Current medicines are reviewed at length with the patient today.  Concerns regarding medicines are outlined above.  Orders Placed This Encounter  Procedures   CBC   Basic metabolic panel with GFR   EKG [id number]   No orders of the defined types were placed in this encounter.   Patient Instructions  Medication Instructions:  No medication changes were made at this visit. Continue current regimen.   *If you need a refill on your cardiac medications before your next appointment, please call your pharmacy*  Lab Work: None ordered today. If you have labs (blood work) drawn today and your tests are completely normal, you will receive your results only by: MyChart Message (if you have MyChart) OR A paper copy in the mail If you have any lab test that is abnormal or we need to change your treatment, we will call you to review the results.  Testing/Procedures: Your physician has requested that you have a cardiac catheterization. Cardiac catheterization is used to diagnose and/or treat various heart conditions. Doctors may recommend this procedure for a number of different reasons. The most common reason is to evaluate chest pain. Chest pain can be a symptom of coronary artery disease (CAD), and cardiac catheterization can show whether plaque is narrowing or blocking your heart's arteries. This procedure is also used to evaluate the valves, as well as  measure the blood flow and oxygen levels in different parts of your heart. For further information please visit [email or web address] Please follow instruction sheet, as given.   Follow-Up: At [facility], you and your health needs are our priority.  As part of our continuing mission to provide you with exceptional heart care, our providers are all part of one team.  This team includes your primary Cardiologist (physician) and Advanced Practice Providers or APPs (Physician Assistants and Nurse Practitioners) who all work together to provide you with the care you need, when you need it.  Your next appointment:   Per Structural Heart Team  We recommend signing up for the patient portal called MyChart.  Sign up information is provided on this After Visit Summary.  MyChart is used to connect with patients for Virtual Visits (Telemedicine).  Patients are able to view lab/test results, encounter notes, upcoming appointments, etc.  Non-urgent messages can be sent to your provider as well.   To learn more about what you can do with MyChart, go to [email or web address].   Other Instructions       Cardiac/Peripheral Catheterization   You are scheduled for a Cardiac Catheterization on Monday, October 6 with Dr. Ozell  Keaira Whitehurst.  1. Please arrive at the [facility] (Main Entrance A) at [facility]: [address] [place], [place] [address]. This time is 2 hour(s) before your procedure to ensure your preparation).   Free valet parking service is available. You will check in at ADMITTING. The support person will be asked to wait in the waiting room.  It is OK to have someone drop you off and come back when you are ready to be discharged.        Special note: Every effort is made to have your procedure done on time. Please understand that emergencies sometimes delay scheduled procedures.  2. Diet: Nothing to eat after midnight.  3. Hydration:You need to be well hydrated before your  procedure. On October 6, you may drink approved liquids (see below) until 2 hours before the procedure, with 16 oz of water as your last intake.   List of approved liquids water, clear juice, clear tea, black coffee, fruit juices, non-citric and without pulp, carbonated beverages, Gatorade, Kool -Aid, plain Jello-O and plain ice popsicles.  4. Labs: You will need to have blood drawn on Thursday, October 2 at [facility] (1st Floor), [address], [place], [place] [address]. You do not need to be fasting.  5. Medication instructions in preparation for your procedure:   Contrast Allergy: No   Stop taking Eliquis  (Apixiban) on Saturday, October 4.  Stop taking, Lisinopril  (Zestril  or Prinivil ) Sunday, October 2,   On the morning of your procedure, take Aspirin  81 mg and any morning medicines NOT listed above.  You may use sips of water.  6. Plan to go home the same day, you will only stay overnight if medically necessary. 7. You MUST have a responsible adult to drive you home. 8. An adult MUST be with you the first 24 hours after you arrive home. 9. Bring a current list of your medications, and the last time and date medication taken. 10. Bring ID and current insurance cards. 11.Please wear clothes that are easy to get on and off and wear slip-on shoes.  Thank you for allowing us  to care for you!   -- [facility]vasive Cardiovascular services     Signed, [name], MD  01/14/2024 9:50 AM    [facility]

## 2024-01-14 NOTE — H&P (View-Only) (Signed)
 Cardiology Office Note:    Date:  01/14/2024   ID:  [name], DOB 10-26-1940, MRN [id number]  PCP:  Alphonsa Glendia LABOR, MD   [facility] Providers Cardiologist:  [name], MD     Referring MD: Alphonsa Glendia LABOR, MD   Chief Complaint  Patient presents with   Shortness of Breath    History of Present Illness:    Steven [name] is a 83 y.o. male with a hx of coronary artery disease, aortic stenosis, and permanent atrial fibrillation, presenting for follow-up evaluation.  The patient is here with his wife today.  He has been having more problems with shortness of breath and fatigue.  He also has had issues with lightheadedness.  He has longstanding shortness of breath over time felt to be related to obesity and deconditioning.  However, his breathing has gotten worse in the past year and he is now short of breath with low-level activities.  He denies orthopnea, PND, or leg swelling.  He has had a few times with near syncope where he has been on his riding mower or doing other activities.  He has not had frank syncope.  He has occasional left-sided chest discomfort but nothing consistently related to physical activity.  The patient has been followed for moderate aortic stenosis.  He recently had an echocardiogram demonstrated an LVEF of 70 to 75% with hyperdynamic function and no regional wall motion abnormalities, normal RV function, mild mitral regurgitation, severe mitral annular calcification with no mitral stenosis, and severe calcification and restriction of the aortic valve leaflets with severe paradoxical low-flow low gradient aortic stenosis.  Mean gradient is 29 mmHg, V-max 3.45, dimensionless index 0.3, and stroke-volume index 27 with calculated aortic valve area 0.86.   Current Medications: Current Meds  Medication Sig   atorvastatin  (LIPITOR) 80 MG tablet Take 1 tablet (80 mg total) by mouth daily.   cholecalciferol (VITAMIN D) 1000 UNITS tablet Take 1,000 Units by  mouth daily.   Cyanocobalamin (VITAMIN B-12 PO) Take 1,000 mg by mouth daily.   DULoxetine  (CYMBALTA ) 60 MG capsule TAKE ONE CAPSULE BY MOUTH DAILY   ELIQUIS  5 MG TABS tablet TAKE (1) TABLET BY MOUTH TWICE DAILY.   lisinopril  (ZESTRIL ) 2.5 MG tablet Take 1 tablet (2.5 mg total) by mouth daily.   Omega-3 Fatty Acids (FISH OIL) 1000 MG CAPS Take 1,000 mg by mouth daily.   pantoprazole  (PROTONIX ) 40 MG tablet Take 1 tablet (40 mg total) by mouth daily.   Probiotic Product (ALIGN PO) Take by mouth every morning.   tamsulosin  (FLOMAX ) 0.4 MG CAPS capsule Take 1 capsule (0.4 mg total) by mouth daily.   zonisamide  (ZONEGRAN ) 100 MG capsule TAKE (2) CAPSULES BY MOUTH ONCE DAILY.     Allergies:   Ciprofloxacin, Oxycodone-acetaminophen , and Oxycodone-acetaminophen    ROS:   Please see the history of present illness.    Positive for bilateral knee pain.  All other systems reviewed and are negative.  EKGs/Labs/Other Studies Reviewed:    The following studies were reviewed today: Cardiac Studies & Procedures   ______________________________________________________________________________________________   STRESS TESTS  MYOCARDIAL PERFUSION IMAGING 09/22/2017  Interpretation Summary  Nuclear stress EF: 64%. The left ventricular ejection fraction is normal (55-65%).  The study is normal.  This is a low risk study. no ischemia. no evidence of infarction   ECHOCARDIOGRAM  ECHOCARDIOGRAM COMPLETE 12/21/2023  Narrative ECHOCARDIOGRAM REPORT    Patient Name:   Steven [name] Date of Exam: 12/21/2023 Medical Rec #:  [id number]     Height:       70.5 in Accession #:    [id number]    Weight:       300.0 lb Date of Birth:  07-Jun-1940      BSA:          2.493 m Patient Age:    83 years      BP:           123/84 mmHg Patient Gender: M             HR:           81 bpm. Exam Location:  [facility]  Procedure: 2D Echo, Cardiac Doppler and Color Doppler (Both Spectral and Color Flow Doppler were  utilized during procedure).  Indications:    Atrial Fibrillation-Persistent atrial fibrillation  History:        Patient has prior history of Echocardiogram examinations, most recent 02/03/2023. CAD, Arrythmias:Atrial Fibrillation; Risk Factors:Dyslipidemia, Hypertension and Prediabetes.  Sonographer:    Aida Pizza RCS Referring Phys: [phone] SCOTT A [facility]UK[facility]  IMPRESSIONS   1. Left ventricular ejection fraction, by estimation, is 70 to 75%. The left ventricle has hyperdynamic function. The left ventricle has no regional wall motion abnormalities. There is moderate asymmetric left ventricular hypertrophy of the basal and septal segments. Left ventricular diastolic function could not be evaluated. 2. Right ventricular systolic function is normal. The right ventricular size is normal. Tricuspid regurgitation signal is inadequate for assessing PA pressure. 3. Left atrial size was severely dilated. 4. Right atrial size was severely dilated. 5. The mitral valve is abnormal. Mild mitral valve regurgitation. No evidence of mitral stenosis. Severe mitral annular calcification. 6. The aortic valve is tricuspid. There is severe calcifcation of the aortic valve. There is severe thickening of the aortic valve. Severe restriction of aortic valve leaflets. Aortic valve regurgitation is moderate. Paradoxical low flow-low gradient severe aortic valve stenosis. Stroke volume index is 27.27 (normal in 01/2023). Aortic valve area, by VTI measures 0.86 cm. Aortic valve mean gradient measures 29.0 mmHg. Aortic valve Vmax measures 3.45 m/s. DVI is 0.30. 7. The inferior vena cava is normal in size with greater than 50% respiratory variability, suggesting right atrial pressure of 3 mmHg.  Comparison(s): Changes from prior study are noted. Pardoxical low flow-low gradient severe AS with reduced SVI 27.27 now compared to moderate AS with normal SVI in 2024.  FINDINGS Left Ventricle: Left ventricular ejection  fraction, by estimation, is 70 to 75%. The left ventricle has hyperdynamic function. The left ventricle has no regional wall motion abnormalities. Strain was performed and the global longitudinal strain is indeterminate. The left ventricular internal cavity size was normal in size. There is moderate asymmetric left ventricular hypertrophy of the basal and septal segments. Left ventricular diastolic function could not be evaluated due to atrial fibrillation. Left ventricular diastolic function could not be evaluated.  Right Ventricle: The right ventricular size is normal. No increase in right ventricular wall thickness. Right ventricular systolic function is normal. Tricuspid regurgitation signal is inadequate for assessing PA pressure.  Left Atrium: Left atrial size was severely dilated.  Right Atrium: Right atrial size was severely dilated.  Pericardium: There is no evidence of pericardial effusion.  Mitral Valve: The mitral valve is abnormal. Severe mitral annular calcification. Mild mitral valve regurgitation. No evidence of mitral valve stenosis.  Tricuspid Valve: The tricuspid valve is normal in structure. Tricuspid valve regurgitation is not demonstrated. No evidence of tricuspid stenosis.  Aortic Valve: The aortic  valve is tricuspid. There is severe calcifcation of the aortic valve. There is severe thickening of the aortic valve. Aortic valve regurgitation is moderate. Moderate to severe aortic stenosis is present. Aortic valve mean gradient measures 29.0 mmHg. Aortic valve peak gradient measures 47.5 mmHg. Aortic valve area, by VTI measures 0.86 cm.  Pulmonic Valve: The pulmonic valve was normal in structure. Pulmonic valve regurgitation is not visualized. No evidence of pulmonic stenosis.  Aorta: The aortic root is normal in size and structure.  Venous: The inferior vena cava is normal in size with greater than 50% respiratory variability, suggesting right atrial pressure of 3  mmHg.  IAS/Shunts: No atrial level shunt detected by color flow Doppler.  Additional Comments: 3D was performed not requiring image post processing on an independent workstation and was indeterminate.   LEFT VENTRICLE PLAX 2D LVIDd:         4.50 cm LVIDs:         2.30 cm LV PW:         1.30 cm LV IVS:        1.10 cm LVOT diam:     2.00 cm LV SV:         68 LV SV Index:   27 LVOT Area:     3.14 cm   RIGHT VENTRICLE RV S prime:     11.60 cm/s TAPSE (M-mode): 2.1 cm  LEFT ATRIUM              Index        RIGHT ATRIUM           Index LA diam:        5.70 cm  2.29 cm/m   RA Area:     30.70 cm LA Vol (A2C):   131.0 ml 52.55 ml/m  RA Volume:   114.00 ml 45.73 ml/m LA Vol (A4C):   151.0 ml 60.57 ml/m LA Biplane Vol: 152.0 ml 60.97 ml/m AORTIC VALVE AV Area (Vmax):    0.95 cm AV Area (Vmean):   0.93 cm AV Area (VTI):     0.86 cm AV Vmax:           344.75 cm/s AV Vmean:          249.000 cm/s AV VTI:            0.792 m AV Peak Grad:      47.5 mmHg AV Mean Grad:      29.0 mmHg LVOT Vmax:         104.00 cm/s LVOT Vmean:        73.800 cm/s LVOT VTI:          0.218 m LVOT/AV VTI ratio: 0.28  AORTA Ao Root diam: 3.80 cm  MITRAL VALVE MV Area (PHT): 2.56 cm     SHUNTS MV Decel Time: 296 msec     Systemic VTI:  0.22 m MR Peak grad: 139.2 mmHg    Systemic Diam: 2.00 cm MR Mean grad: 83.0 mmHg MR Vmax:      590.00 cm/s MR Vmean:     425.0 cm/s MV E velocity: 132.00 cm/s  Vishnu Priya Mallipeddi Electronically signed by Diannah Late Mallipeddi Signature Date/Time: 12/21/2023/3:57:37 PM    Final    MONITORS  LONG TERM MONITOR (3-14 DAYS) 05/02/2019  Narrative 1.  The basic rhythm is atrial fibrillation with an average ventricular rate of 75 bpm, atrial fibrillation burden 100% 2.  Occasional wide-complex beats, PVCs versus aberrancy 3.  No high-grade heart block  or pathologic pauses greater than 3 seconds        ______________________________________________________________________________________________      EKG:   EKG Interpretation Date/Time:  Thursday January 14 2024 08:48:44 EDT Ventricular Rate:  74 PR Interval:    QRS Duration:  88 QT Interval:  386 QTC Calculation: 428 R Axis:   34  Text Interpretation: Atrial fibrillation with a competing junctional pacemaker When compared with ECG of 15-Jan-2023 10:26, Criteria for Anterior infarct are no longer Present T wave inversion no longer evident in Inferior leads Confirmed by Wonda Sharper [phone]) on 01/14/2024 8:58:49 AM    Recent Labs: 11/30/2023: ALT 13; BUN 19; Creatinine, Ser 1.09; Potassium 4.5; Sodium 139  Recent Lipid Panel    Component Value Date/Time   CHOL 126 11/30/2023 0820   TRIG 88 11/30/2023 0820   HDL 40 11/30/2023 0820   CHOLHDL 3.2 11/30/2023 0820   CHOLHDL 3.4 07/20/2015 0435   VLDL 19 07/20/2015 0435   LDLCALC 69 11/30/2023 0820     Risk Assessment/Calculations:    CHA2DS2-VASc Score =     This indicates a  % annual risk of stroke. The patient's score is based upon:                Physical Exam:    VS:  BP 110/82 (BP Location: Left Arm, Patient Position: Sitting, Cuff Size: Large)   Pulse 65   Ht 5' 10 (1.778 m)   Wt (!) 300 lb 3.2 oz (136.2 kg)   SpO2 98%   BMI 43.07 kg/m     Wt Readings from Last 3 Encounters:  01/14/24 (!) 300 lb 3.2 oz (136.2 kg)  01/04/24 (!) 301 lb (136.5 kg)  12/02/23 300 lb (136.1 kg)     GEN: Pleasant obese male in no acute distress HEENT: Normal NECK: No JVD; No carotid bruits LYMPHATICS: No lymphadenopathy CARDIAC: Irregularly irregular with a 3/6 harsh crescendo decrescendo murmur at the right upper sternal border RESPIRATORY:  Clear to auscultation without rales, wheezing or rhonchi  ABDOMEN: Soft, non-tender, non-distended MUSCULOSKELETAL:  No edema; No deformity  SKIN: Warm and dry NEUROLOGIC:  Alert and oriented x 3 PSYCHIATRIC:  Normal affect    Assessment & Plan Persistent atrial fibrillation (HCC) Good rate control, no change.  Continue apixaban . Nonrheumatic aortic valve stenosis The patient has developed severe, symptomatic, stage D3 (paradoxical low-flow low gradient) aortic stenosis.  I have personally reviewed his echo images which confirms severe calcification and restriction of the aortic valve leaflets with Doppler parameters outlined above including low stroke-volume index with calculated aortic valve area of approximately 0.8 cm.  His progressive clinical symptoms and echo findings suggest severe symptomatic aortic stenosis.  We discussed the natural history of aortic stenosis and potential treatment options including palliative medical therapy, surgical aortic valve replacement, or transcatheter aortic valve replacement.  The patient would be an appropriate candidate for transcatheter aortic valve replacement at his advanced age of 83 years old and reduced functional capacity especially in the context of morbid obesity with BMI greater than 40.  He understands the workup will need to include right and left heart catheterization as well as CTA studies of the chest, abdomen, and pelvis.  He will also undergo a gated CTA of the heart to assess for TAVR anatomy.  Once his studies are completed, his case will be reviewed by our multidisciplinary heart valve team and he will be referred for formal cardiac surgical consultation as part of a multidisciplinary approach to his care. Coronary  artery disease of native artery of native heart with stable angina pectoris Some angina reported.  Will assess his coronary anatomy at the time of cardiac catheterization.  Previous heart catheterization report from 2011 reviewed when he was noted to have distal RCA territory and left posterolateral disease with recommendations for medical therapy. BMI 40.0-44.9, adult (HCC) Extensive weight loss counseling has been done over the years without  success Pre-procedure lab exam Check precardiac catheterization labs       Informed Consent   Shared Decision Making/Informed Consent The risks [stroke (1 in 1000), death (1 in 1000), kidney failure [usually temporary] (1 in 500), bleeding (1 in 200), allergic reaction [possibly serious] (1 in 200)], benefits (diagnostic support and management of coronary artery disease) and alternatives of a cardiac catheterization were discussed in detail with Mr. Suit and he is willing to proceed.       Medication Adjustments/Labs and Tests Ordered: Current medicines are reviewed at length with the patient today.  Concerns regarding medicines are outlined above.  Orders Placed This Encounter  Procedures   CBC   Basic metabolic panel with GFR   EKG 87-Ozji   No orders of the defined types were placed in this encounter.   Patient Instructions  Medication Instructions:  No medication changes were made at this visit. Continue current regimen.   *If you need a refill on your cardiac medications before your next appointment, please call your pharmacy*  Lab Work: None ordered today. If you have labs (blood work) drawn today and your tests are completely normal, you will receive your results only by: MyChart Message (if you have MyChart) OR A paper copy in the mail If you have any lab test that is abnormal or we need to change your treatment, we will call you to review the results.  Testing/Procedures: Your physician has requested that you have a cardiac catheterization. Cardiac catheterization is used to diagnose and/or treat various heart conditions. Doctors may recommend this procedure for a number of different reasons. The most common reason is to evaluate chest pain. Chest pain can be a symptom of coronary artery disease (CAD), and cardiac catheterization can show whether plaque is narrowing or blocking your heart's arteries. This procedure is also used to evaluate the valves, as well as  measure the blood flow and oxygen levels in different parts of your heart. For further information please visit [email or web address] Please follow instruction sheet, as given.   Follow-Up: At [facility], you and your health needs are our priority.  As part of our continuing mission to provide you with exceptional heart care, our providers are all part of one team.  This team includes your primary Cardiologist (physician) and Advanced Practice Providers or APPs (Physician Assistants and Nurse Practitioners) who all work together to provide you with the care you need, when you need it.  Your next appointment:   Per Structural Heart Team  We recommend signing up for the patient portal called MyChart.  Sign up information is provided on this After Visit Summary.  MyChart is used to connect with patients for Virtual Visits (Telemedicine).  Patients are able to view lab/test results, encounter notes, upcoming appointments, etc.  Non-urgent messages can be sent to your provider as well.   To learn more about what you can do with MyChart, go to [email or web address].   Other Instructions       Cardiac/Peripheral Catheterization   You are scheduled for a Cardiac Catheterization on Monday, October 6 with Dr. Ozell  Keaira Whitehurst.  1. Please arrive at the [facility] (Main Entrance A) at [facility]: [address] [place], [place] [address]. This time is 2 hour(s) before your procedure to ensure your preparation).   Free valet parking service is available. You will check in at ADMITTING. The support person will be asked to wait in the waiting room.  It is OK to have someone drop you off and come back when you are ready to be discharged.        Special note: Every effort is made to have your procedure done on time. Please understand that emergencies sometimes delay scheduled procedures.  2. Diet: Nothing to eat after midnight.  3. Hydration:You need to be well hydrated before your  procedure. On October 6, you may drink approved liquids (see below) until 2 hours before the procedure, with 16 oz of water as your last intake.   List of approved liquids water, clear juice, clear tea, black coffee, fruit juices, non-citric and without pulp, carbonated beverages, Gatorade, Kool -Aid, plain Jello-O and plain ice popsicles.  4. Labs: You will need to have blood drawn on Thursday, October 2 at [facility] (1st Floor), [address], [place], [place] [address]. You do not need to be fasting.  5. Medication instructions in preparation for your procedure:   Contrast Allergy: No   Stop taking Eliquis  (Apixiban) on Saturday, October 4.  Stop taking, Lisinopril  (Zestril  or Prinivil ) Sunday, October 2,   On the morning of your procedure, take Aspirin  81 mg and any morning medicines NOT listed above.  You may use sips of water.  6. Plan to go home the same day, you will only stay overnight if medically necessary. 7. You MUST have a responsible adult to drive you home. 8. An adult MUST be with you the first 24 hours after you arrive home. 9. Bring a current list of your medications, and the last time and date medication taken. 10. Bring ID and current insurance cards. 11.Please wear clothes that are easy to get on and off and wear slip-on shoes.  Thank you for allowing us  to care for you!   -- [facility] Invasive Cardiovascular services     Signed, [name], MD  01/14/2024 9:50 AM    [facility]

## 2024-01-14 NOTE — Patient Instructions (Addendum)
 Medication Instructions:  No medication changes were made at this visit. Continue current regimen.   *If you need a refill on your cardiac medications before your next appointment, please call your pharmacy*  Lab Work: To be completed today: CBC and BMP  If you have labs (blood work) drawn today and your tests are completely normal, you will receive your results only by: MyChart Message (if you have MyChart) OR A paper copy in the mail If you have any lab test that is abnormal or we need to change your treatment, we will call you to review the results.  Testing/Procedures: Your physician has requested that you have a cardiac catheterization. Cardiac catheterization is used to diagnose and/or treat various heart conditions. Doctors may recommend this procedure for a number of different reasons. The most common reason is to evaluate chest pain. Chest pain can be a symptom of coronary artery disease (CAD), and cardiac catheterization can show whether plaque is narrowing or blocking your heart's arteries. This procedure is also used to evaluate the valves, as well as measure the blood flow and oxygen levels in different parts of your heart. For further information please visit [email or web address] Please follow instruction sheet, as given.   Follow-Up: At [facility], you and your health needs are our priority.  As part of our continuing mission to provide you with exceptional heart care, our providers are all part of one team.  This team includes your primary Cardiologist (physician) and Advanced Practice Providers or APPs (Physician Assistants and Nurse Practitioners) who all work together to provide you with the care you need, when you need it.  Your next appointment:   Per Structural Heart Team  We recommend signing up for the patient portal called MyChart.  Sign up information is provided on this After Visit Summary.  MyChart is used to connect with patients for Virtual Visits  (Telemedicine).  Patients are able to view lab/test results, encounter notes, upcoming appointments, etc.  Non-urgent messages can be sent to your provider as well.   To learn more about what you can do with MyChart, go to [email or web address].   Other Instructions       Cardiac/Peripheral Catheterization   You are scheduled for a Cardiac Catheterization on Monday, October 6 with Dr. [name].  1. Please arrive at the [facility] (Main Entrance A) at [facility]: [address] [place], [place] [address] at 5:30 AM (This time is 2 hour(s) before your procedure to ensure your preparation). Your procedure is scheduled to begin at 7:30 AM.  Free valet parking service is available. You will check in at ADMITTING. The support person will be asked to wait in the waiting room.  It is OK to have someone drop you off and come back when you are ready to be discharged.        Special note: Every effort is made to have your procedure done on time. Please understand that emergencies sometimes delay scheduled procedures.  2. Diet: Nothing to eat after midnight.  3. Hydration:You need to be well hydrated before your procedure. On October 6, you may drink approved liquids (see below) until 2 hours before the procedure, with 16 oz of water as your last intake.   List of approved liquids water, clear juice, clear tea, black coffee, fruit juices, non-citric and without pulp, carbonated beverages, Gatorade, Kool -Aid, plain Jello-O and plain ice popsicles.  4. Labs: You will need to have blood drawn on Thursday, October 2 at  [facility] (1st Floor), [address] [place], [place] [address]. You do not need to be fasting.  5. Medication instructions in preparation for your procedure:   Contrast Allergy: No   Stop taking Eliquis  (Apixiban) on Saturday, October 4.  Stop taking, Lisinopril  (Zestril  or Prinivil ) Sunday, October 2,   On the  morning of your procedure, take Aspirin  81 mg and any morning medicines NOT listed above.  You may use sips of water.  6. Plan to go home the same day, you will only stay overnight if medically necessary. 7. You MUST have a responsible adult to drive you home. 8. An adult MUST be with you the first 24 hours after you arrive home. 9. Bring a current list of your medications, and the last time and date medication taken. 10. Bring ID and current insurance cards. 11.Please wear clothes that are easy to get on and off and wear slip-on shoes.  Thank you for allowing us  to care for you!   -- [facility]

## 2024-01-15 ENCOUNTER — Telehealth: Payer: Self-pay | Admitting: Cardiovascular Disease

## 2024-01-15 LAB — BASIC METABOLIC PANEL WITH GFR
BUN/Creatinine Ratio: 25 — ABNORMAL HIGH (ref 10–24)
BUN: 27 mg/dL (ref 8–27)
CO2: 22 mmol/L (ref 20–29)
Calcium: 9.3 mg/dL (ref 8.6–10.2)
Chloride: 104 mmol/L (ref 96–106)
Creatinine, Ser: 1.07 mg/dL (ref 0.76–1.27)
Glucose: 92 mg/dL (ref 70–99)
Potassium: 4.6 mmol/L (ref 3.5–5.2)
Sodium: 139 mmol/L (ref 134–144)
eGFR: 69 mL/min/1.73 (ref >59)

## 2024-01-15 LAB — CBC
Hematocrit: 44.6 % (ref 37.5–51.0)
Hemoglobin: 14.8 g/dL (ref 13.0–17.7)
MCH: 32 pg (ref 26.6–33.0)
MCHC: 33.2 g/dL (ref 31.5–35.7)
MCV: 97 fL (ref 79–97)
Platelets: 183 x10E3/uL (ref 150–450)
RBC: 4.62 x10E6/uL (ref 4.14–5.80)
RDW: 12.1 % (ref 11.6–15.4)
WBC: 7.8 x10E3/uL (ref 3.4–10.8)

## 2024-01-15 NOTE — Telephone Encounter (Signed)
 Called patient's spouse Bruna [facility]) back and verified patient's identity. Reviewed cath prep instructions, patient is to hold eliquis  starting Saturday and take an 81 mg aspirin  the morning of. Advised that sedation is used during procedure so patient likely will not be struggling with discomfort during procedure. Patient's spouse feels patient will be ok to wait to take any tylenol  after the procedure.

## 2024-01-15 NOTE — Telephone Encounter (Signed)
 Wife is calling to see if its okay for he patient to take two tylenol , prior to his procedure on Monday. due to back issues. Please advise

## 2024-01-18 ENCOUNTER — Encounter (HOSPITAL_COMMUNITY)
Admission: RE | Disposition: A | Discharge status: Home / Self Care | Payer: Self-pay | Source: Home / Self Care | Attending: Cardiovascular Disease

## 2024-01-18 ENCOUNTER — Other Ambulatory Visit: Payer: Self-pay

## 2024-01-18 ENCOUNTER — Ambulatory Visit (HOSPITAL_COMMUNITY)
Admission: RE | Admit: 2024-01-18 | Discharge: 2024-01-18 | Disposition: A | Discharge status: Home / Self Care | Attending: Cardiovascular Disease | Admitting: Cardiovascular Disease

## 2024-01-18 DIAGNOSIS — I2510 Atherosclerotic heart disease of native coronary artery without angina pectoris: Secondary | ICD-10-CM | POA: Diagnosis not present

## 2024-01-18 DIAGNOSIS — I350 Nonrheumatic aortic (valve) stenosis: Secondary | ICD-10-CM | POA: Diagnosis not present

## 2024-01-18 DIAGNOSIS — E669 Obesity, unspecified: Secondary | ICD-10-CM | POA: Insufficient documentation

## 2024-01-18 DIAGNOSIS — Z79899 Other long term (current) drug therapy: Secondary | ICD-10-CM | POA: Insufficient documentation

## 2024-01-18 DIAGNOSIS — I25118 Atherosclerotic heart disease of native coronary artery with other forms of angina pectoris: Secondary | ICD-10-CM | POA: Diagnosis not present

## 2024-01-18 DIAGNOSIS — I4819 Other persistent atrial fibrillation: Secondary | ICD-10-CM | POA: Diagnosis not present

## 2024-01-18 DIAGNOSIS — Z7901 Long term (current) use of anticoagulants: Secondary | ICD-10-CM | POA: Insufficient documentation

## 2024-01-18 DIAGNOSIS — I2584 Coronary atherosclerosis due to calcified coronary lesion: Secondary | ICD-10-CM | POA: Diagnosis not present

## 2024-01-18 DIAGNOSIS — Z6841 Body Mass Index (BMI) 40.0 and over, adult: Secondary | ICD-10-CM | POA: Diagnosis not present

## 2024-01-18 HISTORY — PX: RIGHT HEART CATH AND CORONARY ANGIOGRAPHY: CATH118264

## 2024-01-18 LAB — POCT I-STAT 7, (LYTES, BLD GAS, ICA,H+H)
Acid-base deficit: 5 mmol/L — ABNORMAL HIGH (ref 0.0–2.0)
Bicarbonate: 20.7 mmol/L (ref 20.0–28.0)
Calcium, Ion: 1.28 mmol/L (ref 1.15–1.40)
HCT: 39 % (ref 39.0–52.0)
Hemoglobin: 13.3 g/dL (ref 13.0–17.0)
O2 Saturation: 96 %
Potassium: 4 mmol/L (ref 3.5–5.1)
Sodium: 139 mmol/L (ref 135–145)
TCO2: 22 mmol/L (ref 22–32)
pCO2 arterial: 39.1 mmHg (ref 32–48)
pH, Arterial: 7.332 — ABNORMAL LOW (ref 7.35–7.45)
pO2, Arterial: 84 mmHg (ref 83–108)

## 2024-01-18 LAB — POCT I-STAT EG7
Acid-base deficit: 6 mmol/L — ABNORMAL HIGH (ref 0.0–2.0)
Bicarbonate: 21 mmol/L (ref 20.0–28.0)
Calcium, Ion: 1.24 mmol/L (ref 1.15–1.40)
HCT: 38 % — ABNORMAL LOW (ref 39.0–52.0)
Hemoglobin: 12.9 g/dL — ABNORMAL LOW (ref 13.0–17.0)
O2 Saturation: 67 %
Potassium: 3.7 mmol/L (ref 3.5–5.1)
Sodium: 134 mmol/L — ABNORMAL LOW (ref 135–145)
TCO2: 22 mmol/L (ref 22–32)
pCO2, Ven: 44.4 mmHg (ref 44–60)
pH, Ven: 7.282 (ref 7.25–7.43)
pO2, Ven: 39 mmHg (ref 32–45)

## 2024-01-18 SURGERY — RIGHT HEART CATH AND CORONARY ANGIOGRAPHY
Anesthesia: LOCAL

## 2024-01-18 MED ORDER — HYDRALAZINE HCL 20 MG/ML IJ SOLN: 10.0000 mg | INTRAMUSCULAR | Status: DC | PRN

## 2024-01-18 MED ORDER — SODIUM CHLORIDE 0.9% FLUSH: 3.0000 mL | INTRAVENOUS | Status: DC | PRN

## 2024-01-18 MED ORDER — LIDOCAINE HCL (PF) 1 % IJ SOLN
INTRAMUSCULAR | Status: AC
  Filled 2024-01-18: qty 30

## 2024-01-18 MED ORDER — SODIUM CHLORIDE 0.9 % IV SOLN: 250.0000 mL | INTRAVENOUS | Status: DC | PRN

## 2024-01-18 MED ORDER — IOHEXOL 350 MG/ML SOLN
INTRAVENOUS | Status: DC | PRN
  Administered 2024-01-18: 95 mL

## 2024-01-18 MED ORDER — VERAPAMIL HCL 2.5 MG/ML IV SOLN
INTRAVENOUS | Status: DC | PRN
  Administered 2024-01-18: 10 mL via INTRA_ARTERIAL

## 2024-01-18 MED ORDER — FENTANYL CITRATE (PF) 100 MCG/2ML IJ SOLN
INTRAMUSCULAR | Status: AC
  Filled 2024-01-18: qty 2

## 2024-01-18 MED ORDER — SODIUM CHLORIDE 0.9% FLUSH: 3.0000 mL | Freq: Two times a day (BID) | INTRAVENOUS | Status: DC

## 2024-01-18 MED ORDER — MIDAZOLAM HCL 2 MG/2ML IJ SOLN
INTRAMUSCULAR | Status: AC
  Filled 2024-01-18: qty 2

## 2024-01-18 MED ORDER — FREE WATER
500.0000 mL | Freq: Once | Status: AC
  Administered 2024-01-18: 500 mL via ORAL

## 2024-01-18 MED ORDER — ONDANSETRON HCL 4 MG/2ML IJ SOLN: 4.0000 mg | Freq: Four times a day (QID) | INTRAMUSCULAR | Status: DC | PRN

## 2024-01-18 MED ORDER — VERAPAMIL HCL 2.5 MG/ML IV SOLN
INTRAVENOUS | Status: AC
  Filled 2024-01-18: qty 2

## 2024-01-18 MED ORDER — HEPARIN SODIUM (PORCINE) 1000 UNIT/ML IJ SOLN
INTRAMUSCULAR | Status: DC | PRN
  Administered 2024-01-18: 6000 [IU] via INTRAVENOUS

## 2024-01-18 MED ORDER — FENTANYL CITRATE (PF) 100 MCG/2ML IJ SOLN
INTRAMUSCULAR | Status: DC | PRN
  Administered 2024-01-18: 25 ug via INTRAVENOUS

## 2024-01-18 MED ORDER — LABETALOL HCL 5 MG/ML IV SOLN: 10.0000 mg | INTRAVENOUS | Status: DC | PRN

## 2024-01-18 MED ORDER — MIDAZOLAM HCL 2 MG/2ML IJ SOLN
INTRAMUSCULAR | Status: DC | PRN
  Administered 2024-01-18: 2 mg via INTRAVENOUS

## 2024-01-18 MED ORDER — LIDOCAINE HCL (PF) 1 % IJ SOLN
INTRAMUSCULAR | Status: DC | PRN
  Administered 2024-01-18 (×2): 2 mL

## 2024-01-18 MED ORDER — ASPIRIN 81 MG PO CHEW
81.0000 mg | CHEWABLE_TABLET | ORAL | Status: AC
  Administered 2024-01-18: 81 mg via ORAL
  Filled 2024-01-18: qty 1

## 2024-01-18 MED ORDER — HEPARIN (PORCINE) IN NACL 1000-0.9 UT/500ML-% IV SOLN
INTRAVENOUS | Status: DC | PRN
  Administered 2024-01-18: 1000 mL

## 2024-01-18 SURGICAL SUPPLY — 11 items
CATH 5FR JL3.5 JR4 ANG PIG MP (CATHETERS) IMPLANT
CATH BALLN WEDGE 5F 110CM (CATHETERS) IMPLANT
DEVICE RAD COMP TR BAND LRG (VASCULAR PRODUCTS) IMPLANT
GLIDESHEATH SLEND SS 6F .021 (SHEATH) IMPLANT
GUIDEWIRE ANGLED .035X150CM (WIRE) IMPLANT
GUIDEWIRE INQWIRE 1.5J.035X260 (WIRE) IMPLANT
KIT SYRINGE INJ CVI SPIKEX1 (MISCELLANEOUS) IMPLANT
PACK CARDIAC CATHETERIZATION (CUSTOM PROCEDURE TRAY) ×2 IMPLANT
SET ATX-X65L (MISCELLANEOUS) IMPLANT
SHEATH 6FR 85 DEST SLENDER (SHEATH) IMPLANT
SHEATH GLIDE SLENDER 4/5FR (SHEATH) IMPLANT

## 2024-01-18 NOTE — Interval H&P Note (Signed)
 History and Physical Interval Note:  01/18/2024 7:10 AM  Steven [name]  has presented today for surgery, with the diagnosis of aortic stenosis.  The various methods of treatment have been discussed with the patient and family. After consideration of risks, benefits and other options for treatment, the patient has consented to  Procedure(s): RIGHT/LEFT HEART CATH AND CORONARY ANGIOGRAPHY (N/A) as a surgical intervention.  The patient's history has been reviewed, patient examined, no change in status, stable for surgery.  I have reviewed the patient's chart and labs.  Questions were answered to the patient's satisfaction.     [name]

## 2024-01-19 ENCOUNTER — Encounter (HOSPITAL_COMMUNITY): Payer: Self-pay | Admitting: Cardiovascular Disease

## 2024-01-19 ENCOUNTER — Other Ambulatory Visit: Payer: Self-pay

## 2024-01-19 DIAGNOSIS — I350 Nonrheumatic aortic (valve) stenosis: Secondary | ICD-10-CM

## 2024-01-20 ENCOUNTER — Telehealth: Payer: Self-pay | Admitting: Family Medicine

## 2024-01-20 NOTE — Telephone Encounter (Signed)
 Prescription Request  01/20/2024  LOV: 12/02/2023  What is the name of the medication or equipment? zonisamide  (ZONEGRAN ) 100 MG capsule   Have you contacted your pharmacy to request a refill? Yes   Which pharmacy would you like this sent to?  [facility] [place], [place] - [id number] [place] [address] Phone: [phone] Fax: [phone]    Patient notified that their request is being sent to the clinical staff for review and that they should receive a response within 2 business days.   Please advise at [facility] [phone]

## 2024-01-20 NOTE — Telephone Encounter (Signed)
 Prescription Request  01/20/2024  LOV: 12/02/2023  What is the name of the medication or equipment? pantoprazole  (PROTONIX ) 40 MG tablet   Have you contacted your pharmacy to request a refill? Yes   Which pharmacy would you like this sent to?  [facility] [place], [place] - [id number] [place] [address] Phone: [phone] Fax: [phone]    Patient notified that their request is being sent to the clinical staff for review and that they should receive a response within 2 business days.   Please advise at [facility] [phone]

## 2024-01-21 ENCOUNTER — Other Ambulatory Visit: Payer: Self-pay | Admitting: Nurse Practitioner

## 2024-01-21 ENCOUNTER — Other Ambulatory Visit: Payer: Self-pay | Admitting: Family Medicine

## 2024-01-21 MED ORDER — PANTOPRAZOLE SODIUM 40 MG PO TBEC: 40.0000 mg | DELAYED_RELEASE_TABLET | Freq: Every day | ORAL | 0 refills | Status: DC

## 2024-01-21 NOTE — Telephone Encounter (Unsigned)
 Copied from CRM [id number]. Topic: Clinical - Medication Refill >> Jan 21, 2024  3:43 PM Zebedee SAUNDERS wrote: Medication: zonisamide  (ZONEGRAN ) 100 MG capsule  Has the patient contacted their pharmacy? Yes (Agent: If no, request that the patient contact the pharmacy for the refill. If patient does not wish to contact the pharmacy document the reason why and proceed with request.) (Agent: If yes, when and what did the pharmacy advise?)Pharmacy need PCP approval  This is the patient's preferred pharmacy:  [facility] [place], [place] - [id number] [place] [address] Phone: [phone] Fax: [phone]  Is this the correct pharmacy for this prescription? Yes If no, delete pharmacy and type the correct one.   Has the prescription been filled recently? Yes  Is the patient out of the medication? Yes  Has the patient been seen for an appointment in the last year OR does the patient have an upcoming appointment? Yes  Can we respond through MyChart? Yes  Agent: Please be advised that Rx refills may take up to 3 business days. We ask that you follow-up with your pharmacy.

## 2024-01-22 MED ORDER — ZONISAMIDE 100 MG PO CAPS: ORAL_CAPSULE | ORAL | 4 refills | Status: AC

## 2024-01-26 ENCOUNTER — Ambulatory Visit (HOSPITAL_COMMUNITY)
Admission: RE | Admit: 2024-01-26 | Discharge: 2024-01-26 | Disposition: A | Discharge status: Home / Self Care | Source: Ambulatory Visit | Attending: Internal Medicine | Admitting: Internal Medicine

## 2024-01-26 DIAGNOSIS — I517 Cardiomegaly: Secondary | ICD-10-CM | POA: Diagnosis not present

## 2024-01-26 DIAGNOSIS — I2510 Atherosclerotic heart disease of native coronary artery without angina pectoris: Secondary | ICD-10-CM | POA: Diagnosis not present

## 2024-01-26 DIAGNOSIS — I700 Atherosclerosis of aorta: Secondary | ICD-10-CM | POA: Diagnosis not present

## 2024-01-26 DIAGNOSIS — K5730 Diverticulosis of large intestine without perforation or abscess without bleeding: Secondary | ICD-10-CM | POA: Diagnosis not present

## 2024-01-26 DIAGNOSIS — I7121 Aneurysm of the ascending aorta, without rupture: Secondary | ICD-10-CM | POA: Diagnosis not present

## 2024-01-26 DIAGNOSIS — I350 Nonrheumatic aortic (valve) stenosis: Secondary | ICD-10-CM | POA: Insufficient documentation

## 2024-01-26 DIAGNOSIS — Z01810 Encounter for preprocedural cardiovascular examination: Secondary | ICD-10-CM | POA: Insufficient documentation

## 2024-01-26 DIAGNOSIS — I708 Atherosclerosis of other arteries: Secondary | ICD-10-CM | POA: Diagnosis not present

## 2024-01-26 MED ORDER — IOHEXOL 350 MG/ML SOLN
100.0000 mL | Freq: Once | INTRAVENOUS | Status: AC | PRN
  Administered 2024-01-26: 100 mL via INTRAVENOUS

## 2024-01-26 NOTE — Progress Notes (Signed)
 Procedure Type: Isolated AVR Perioperative Outcome Estimate % Operative Mortality 4.19% Morbidity & Mortality 14.3% Stroke 2.14% Renal Failure 4.76% Reoperation 3.39% Prolonged Ventilation 7.95% Deep Sternal Wound Infection 0.157% Long Hospital Stay (>14 days) 6.45% Short Hospital Stay (<6 days)* 20.5%

## 2024-01-27 ENCOUNTER — Ambulatory Visit: Admitting: Neurology

## 2024-01-27 DIAGNOSIS — R400 Somnolence: Secondary | ICD-10-CM

## 2024-01-27 DIAGNOSIS — G4733 Obstructive sleep apnea (adult) (pediatric): Secondary | ICD-10-CM | POA: Diagnosis not present

## 2024-01-27 DIAGNOSIS — G40109 Localization-related (focal) (partial) symptomatic epilepsy and epileptic syndromes with simple partial seizures, not intractable, without status epilepticus: Secondary | ICD-10-CM

## 2024-01-27 DIAGNOSIS — I4819 Other persistent atrial fibrillation: Secondary | ICD-10-CM

## 2024-01-27 DIAGNOSIS — Z9189 Other specified personal risk factors, not elsewhere classified: Secondary | ICD-10-CM

## 2024-01-27 DIAGNOSIS — I25110 Atherosclerotic heart disease of native coronary artery with unstable angina pectoris: Secondary | ICD-10-CM

## 2024-01-27 DIAGNOSIS — G4739 Other sleep apnea: Secondary | ICD-10-CM

## 2024-01-28 ENCOUNTER — Ambulatory Visit: Admitting: Cardiovascular Disease

## 2024-01-28 NOTE — Progress Notes (Signed)
 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 I have the pleasure of meeting with Steven [name] on 01-04-2024, who is a 83 y.o  male patient of Dr Sharion and seen here at [facility] for epilepsy, he has atrial fibrillation, seen upon a referral by Dr Alphonsa for Sleep Medicine Consultation.  The patient's referral information asked for a re-evaluation of OSA in the setting of non- refreshing sleep, not waking up restored in AM. He is known to snore loud, sleeps with the head of bed elevated.  He naps most days for 30-60 minutes .   He had a history of GERD, hearing loss,  CAD,  chronic pain, osteoarthritis,  SOB.  Risk factors for OSA were present,  including : Body mass index is 43.19 kg/m., almost 19  neck size  and upper airway anatomy. He has retrognathia.  Atrial fib is present.      Epworth sleepiness score: 12 / 24 points.    FSS endorsed at 39/ 63 points.  GDS:  3/ 15    BMI: 43.2  kg/m   Neck Circumference: 18.75       Sleep Summary:   Total Recording Time (hours, min):     7 hours  47 m   Total Sleep Time (hours, min):         7 hours , 14 minutes        Percent REM (%):    20%                                    Respiratory Indices:   Calculated pAHI (per hour):   AASM based AHI was 35/h   CMS AHI  26.2/h  with over 13 % central apneas.                           REM pAHI:     36/h                                            NREM pAHI:  28/h                             Supine AHI:     18/h  and non supine AHI was 28.5/h   Snoring was moderate 42 dB mean volume, but present for almost 50% of recorded sleep time.                                                Oxygen Saturation Statistics:   Oxygen Saturation (%) Mean:     94%           O2 Saturation Range (%):   between 85 and  98 %                                    O2 Saturation (minutes) <89%:    1.1 minute       Pulse Rate Statistics:   Pulse Mean (bpm):    64 bpm             Pulse Range:    between 37 and 108 bpm  , brady and tachycardia, likely related to atrial fibrillation.  Caveat: the watch pat device does not provide data of cardiac rhythm.              IMPRESSION:  This HST confirms the presence of  moderate- severe complex sleep apnea; there were mostly obstructive but also central apneas present.  Oxygen saturation was in normal limits, cardiac rate was abnormal- fast and slow .    RECOMMENDATION: This patient would be best served with positive airway pressure therapy, either CPAP or BiPAP.  Only these modalities are potentially helpful in reducing the atrial fibrillation that is underlying.  Central apneas do not respond to dental devices or implanted  tongue stimulators.  Plan A) I would prefer to see a titration in the sleep lab in room 2 with an adjustable bed that can be elevated to comfortable levels for this patient. This allows for a sensible mask fitting as well.  Xanax for asleep aid ordered.  Plan B ) if not permitted by insurance, will try to treat by autotitration CPAP device, ResMed  with a mask to be fitted by  DME, set at 5-20 cm water with 2 cm EPR and heated humidification. RV in 3 months with NP or me to evaluate compliance and therapeutic benefits.   Weight loss is strongly recommended. The degree of sleep apnea ( moderate - severe)  seen in this test would qualify the patient for FDA approved zepbound ( mounjaro)  medication, but that doesn't mean that insurance will cover these medications.   Any CPAP patient should be reminded to be fully compliant with PAP therapy , (defined as using PAP therapy for more than 4 hours each night ) with the goal to improve  sleep related symptoms and decrease long term cardiovascular risks. Any PAP therapy patient should be reminded, that it may take up to 3 months to get fully used to using PAP and it may take 1-2 weeks for an established CPAP user to acclimatize to changes in pressure or mask. The earlier full compliance is achieved, the better long term compliance tends to be.   Please note that untreated obstructive sleep apnea may carry additional perioperative morbidity. Patients with significant obstructive sleep apnea should receive perioperative PAP therapy and the surgical team should be informed of the diagnosis and degree of sleep disordered breathing.  Sleep fragmentation in the presence of normal proportional sleep stages is a nonspecific findings and per se does not signify an intrinsic sleep disorder or a cause for the patient's sleep-related symptoms.  Causes include (but are not limited to) the unfamiliarity of sleeping while recorded by HST device or sleeping in a sleep lab for a full Polysomnography sleep study, but also circadian rhythm disturbances, medication side effects or an underlying mood disorder or medical problem.     INTERPRETING PHYSICIAN:  Dedra Gores, MD  [facility] Sleep Board certified in Sleep Medicine by The [employer] of [employer] and  Diplomate of the [employer] (AASM) . Board certified In Neurology, Diplomat of the ABPN,  Fellow of the [employer].

## 2024-02-03 ENCOUNTER — Ambulatory Visit: Payer: Self-pay | Admitting: Neurology

## 2024-02-03 DIAGNOSIS — G4739 Other sleep apnea: Secondary | ICD-10-CM | POA: Insufficient documentation

## 2024-02-03 MED ORDER — ALPRAZOLAM 0.5 MG PO TABS
0.5000 mg | ORAL_TABLET | Freq: Every evening | ORAL | 0 refills | Status: AC | PRN
Start: 2024-02-03 — End: ?

## 2024-02-03 NOTE — Procedures (Signed)
 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 I have the pleasure of meeting with Steven [name] on 01-04-2024, who is a 83 y.o  male patient of Dr Sharion and seen here at [facility] for epilepsy, he has atrial fibrillation, seen upon a referral by Dr Alphonsa for Sleep Medicine Consultation.  The patient's referral information asked for a re-evaluation of OSA in the setting of non- refreshing sleep, not waking up restored in AM. He is known to snore loud, sleeps with the head of bed elevated.  He naps most days for 30-60 minutes .   He had a history of GERD, hearing loss,  CAD,  chronic pain, osteoarthritis,  SOB.  Risk factors for OSA were present,  including : Body mass index is 43.19 kg/m., almost 19  neck size  and upper airway anatomy. He has retrognathia.  Atrial fib is present.      Epworth sleepiness score: 12 / 24 points.    FSS endorsed at 39/ 63 points.  GDS:  3/ 15    BMI: 43.2  kg/m   Neck Circumference: 18.75       Sleep Summary:   Total Recording Time (hours, min):     7 hours  47 m   Total Sleep Time (hours, min):         7 hours , 14 minutes        Percent REM (%):    20%                                    Respiratory Indices:   Calculated pAHI (per hour):   AASM based AHI was 35/h   CMS AHI  26.2/h  with over 13 % central apneas.                           REM pAHI:     36/h                                            NREM pAHI:  28/h                             Supine AHI:     18/h  and non supine AHI was 28.5/h   Snoring was moderate 42 dB mean volume, but present for almost 50% of recorded sleep time.                                               Oxygen  Saturation Statistics:   Oxygen Saturation (%) Mean:     94%           O2 Saturation Range (%):   between 85 and 98 %  O2 Saturation (minutes) <89%:    1.1 minute       Pulse Rate Statistics:   Pulse Mean (bpm):    64 bpm             Pulse Range:    between 37 and 108 bpm  , brady and tachycardia, likely related to atrial fibrillation.  Caveat: the watch pat device does not provide data of cardiac rhythm.              IMPRESSION:  This HST confirms the presence of  moderate- severe complex sleep apnea; there were mostly obstructive but also central apneas present.  Oxygen saturation was in normal limits, cardiac rate was abnormal- fast and slow .    RECOMMENDATION: This patient would be best served with positive airway pressure therapy, either CPAP or BiPAP.  Only these modalities are potentially helpful in reducing the atrial fibrillation that is underlying.  Central apneas do not respond to dental devices or implanted  tongue stimulators.  Plan A) I would prefer to see a titration in the sleep lab in room 2 with an adjustable bed that can be elevated to comfortable levels for this patient. This allows for a sensible mask fitting as well.  Xanax for asleep aid ordered.  Plan B ) if not permitted by insurance, will try to treat by autotitration CPAP device, ResMed  with a mask to be fitted by  DME, set at 5-20 cm water with 2 cm EPR and heated humidification. RV in 3 months with NP or me to evaluate compliance and therapeutic benefits.   Weight loss is strongly recommended. The degree of sleep apnea ( moderate - severe)  seen in this test would qualify the patient for FDA approved zepbound ( mounjaro)  medication, but that doesn't mean that insurance will cover these medications.   Any CPAP patient should be reminded to be fully compliant with PAP therapy , (defined as using PAP therapy for more than 4 hours each night ) with the goal to improve sleep  related symptoms and decrease long term cardiovascular risks. Any PAP therapy patient should be reminded, that it may take up to 3 months to get fully used to using PAP and it may take 1-2 weeks for an established CPAP user to acclimatize to changes in pressure or mask. The earlier full compliance is achieved, the better long term compliance tends to be.   Please note that untreated obstructive sleep apnea may carry additional perioperative morbidity. Patients with significant obstructive sleep apnea should receive perioperative PAP therapy and the surgical team should be informed of the diagnosis and degree of sleep disordered breathing.  Sleep fragmentation in the presence of normal proportional sleep stages is a nonspecific findings and per se does not signify an intrinsic sleep disorder or a cause for the patient's sleep-related symptoms.  Causes include (but are not limited to) the unfamiliarity of sleeping while recorded by HST device or sleeping in a sleep lab for a full Polysomnography sleep study, but also circadian rhythm disturbances, medication side effects or an underlying mood disorder or medical problem.     INTERPRETING PHYSICIAN:  Steven Gores, MD  [facility] and [facility] Sleep Board certified in Sleep Medicine by The [employer] of Sleep Medicine and Diplomate of the [employer] (AASM) . Board certified In Neurology, Diplomat of the ABPN,  Fellow of the [employer].

## 2024-02-04 ENCOUNTER — Telehealth: Payer: Self-pay | Admitting: *Deleted

## 2024-02-04 NOTE — Telephone Encounter (Signed)
 Copied from CRM #[id number]. Topic: Clinical - Lab/Test Results >> Feb 04, 2024  1:40 PM Antony S wrote: Reason for CRM:  inquiring about the sleep study results, wife called and said husband did not take a medication before doing the sleep study. Please call and advise with an update

## 2024-02-04 NOTE — Telephone Encounter (Signed)
 Pt's wife called requesting the results of her husbands HST. Please advise.

## 2024-02-04 NOTE — Telephone Encounter (Signed)
 Neurologist stated the following:  This HST confirms the presence of  moderate- severe complex sleep apnea; there were mostly obstructive but also central apneas present.  Oxygen saturation was in normal limits, cardiac rate was abnormal- fast and slow ( known atrial fibrillation is the most likely cause ) .    Please tell the patient that he would be best served with positive airway pressure therapy, either CPAP or BiPAP.  Plan A) I would prefer to see a titration in the sleep lab in room 2 with an adjustable bed that can be elevated to comfortable levels for this patient. This allows for a sensible mask fitting as well.  Xanax for asleep aid ordered.   Plan B ) resmed auto CPAP device, for details read my recommendations   Follow-up per neurology as planned-if they have additional questions let me know-based on sleep study results I believe that he has mild to moderate sleep apnea and would benefit from a titration study-thanks-Dr. Glendia  Please notify the patient of the above or send him a copy via MyChart-if there are any additional specific questions please let me know thank you

## 2024-02-05 ENCOUNTER — Telehealth: Payer: Self-pay | Admitting: Cardiovascular Disease

## 2024-02-05 NOTE — Telephone Encounter (Signed)
 Pt spouse called for results of ct scan please advise

## 2024-02-05 NOTE — Telephone Encounter (Signed)
 Preliminary Please call wife once Dr. Wonda reviews/advises. They would really like to have surgery asap. Please call wife on cell phone, aware nurse will follow up next week

## 2024-02-05 NOTE — Telephone Encounter (Signed)
 Patient notified via mychart

## 2024-02-08 ENCOUNTER — Telehealth: Payer: Self-pay | Admitting: Family Medicine

## 2024-02-08 NOTE — Telephone Encounter (Signed)
Agree thanks Katie 

## 2024-02-08 NOTE — Telephone Encounter (Signed)
 Refill on    DULoxetine  (CYMBALTA ) 60 MG capsule   [facility]

## 2024-02-09 MED ORDER — DULOXETINE HCL 60 MG PO CPEP: 60.0000 mg | ORAL_CAPSULE | Freq: Every day | ORAL | 1 refills | Status: AC

## 2024-02-10 ENCOUNTER — Telehealth: Payer: Self-pay | Admitting: Neurology

## 2024-02-10 NOTE — Telephone Encounter (Signed)
 CPAP HTA pending

## 2024-02-11 ENCOUNTER — Ambulatory Visit: Admitting: Cardiovascular Disease

## 2024-03-01 NOTE — Telephone Encounter (Signed)
 CPAP HTA shara: [id number] (exp. 02/10/24 to 05/10/24)

## 2024-03-03 ENCOUNTER — Ambulatory Visit

## 2024-03-03 VITALS — BP 144/90 | HR 80 | Resp 20 | Ht 70.0 in | Wt 300.0 lb

## 2024-03-03 DIAGNOSIS — I350 Nonrheumatic aortic (valve) stenosis: Secondary | ICD-10-CM

## 2024-03-03 NOTE — H&P (View-Only) (Signed)
 [facility]   MULTIDISCIPLINARY HEART VALVE CLINIC    ATARI NOVICK [facility] Medical Record #[id number] Date of Birth: 01/17/1941  Referring: Wonda Sharper, MD Primary Care: Alphonsa Glendia LABOR, MD Primary Cardiologist:Michael Wonda, MD  Chief Complaint:    Chief Complaint  Patient presents with   Aortic Stenosis    TAVR Consult/ review all testing    History of Present Illness:     Steven [name] is a 83 y.o. male presents for evaluation of severe aortic stenosis and TAVR.  His main complaints are dizziness, fatigue and dyspnea.  He notes that even in the reception area, he took a step backwards and felt like he might fall.  He is very functional and independent in his ADLs, although he does admit he has not been exercising at all and has gotten quite lazy.  He has a history of CAD, TAA, HTN, HL and Afib on Eliquis .  His ECHO recently demonstrated EF 70%, severe MAC with pLFLG AS w/ MG 29 mmHg, Vmax 3.44, AVA 0.86.  My measurements: Annulus: Area - 618, Perimeter 90.1, Dia 28.  Subannular calcium  below NCC that extends into LVOT RCA 17.2, LCA 18.2 mm SOV 35.1-36.7 mm Transfemoral access is appropriate size although quite deep   Past Medical History:  Diagnosis Date   Chronic pain    Complication of anesthesia    pt woke up during last surgery    Coronary artery disease 02-27-11   Dr. Wonda slain follows-has some blocked coronary arteries ,not suitable for stent  placement   Dyslipidemia    Dysrhythmia    hx of atrial fib during last hospitalization    GERD (gastroesophageal reflux disease) 02-27-11   Acid reflux   Hearing loss 02-27-11   Bilateral hearing aids-due to exposure to loud machinery   Hemorrhoids 02-27-11   not bothersome at this time   Hyperlipidemia    Hypertension    Leg swelling    Obesity    Osteoarthritis 02-27-11   Ostearthritis-knees, shoulders.Back causes chronic pain-radiates down right leg   Rash    Seizures (HCC)  07/2014   Shingles outbreak 02-27-11   2 weeks ago , was tx.-only residual is tenderness of right scalp-no open areas   Shortness of breath    with exertion    Urinary incontinence 02-27-11   not an everday occurrence-no special measures    Past Surgical History:  Procedure Laterality Date   CHOLECYSTECTOMY  03/14/2011   Procedure: LAPAROSCOPIC CHOLECYSTECTOMY;  Surgeon: Morene ONEIDA Olives, MD;  Location: [facility];  Service: General;  Laterality: N/A;   COLONOSCOPY     INJECTION KNEE  03/05/2011   Procedure: KNEE INJECTION;  Surgeon: Dempsey LULLA Moan;  Location: [facility];  Service: Orthopedics;  Laterality: Left;  80 mg depomedrol   LAPAROSCOPY  03/16/2011   Procedure: LAPAROSCOPY DIAGNOSTIC;  Surgeon: Redell Alm Faith, DO;  Location: [facility];  Service: General;  Laterality: N/A;  repair incisional hernia   REPLACEMENT TOTAL KNEE  02-27-11   right- 2007   RIGHT HEART CATH AND CORONARY ANGIOGRAPHY N/A 01/18/2024   Procedure: RIGHT HEART CATH AND CORONARY ANGIOGRAPHY;  Surgeon: Wonda Sharper, MD;  Location: [facility]IVE [facility];  Service: Cardiovascular;  Laterality: N/A;   TOTAL KNEE ARTHROPLASTY  03/01/2012   Procedure: TOTAL KNEE ARTHROPLASTY;  Surgeon: Dempsey LULLA Moan, MD;  Location: [facility];  Service: Orthopedics;  Laterality: Left;   TOTAL KNEE REVISION  03/05/2011   Procedure: TOTAL KNEE REVISION;  Surgeon: Dempsey  V Aluisio;  Location: [facility];  Service: Orthopedics;  Laterality: Right;    Social History:  Social History   Tobacco Use  Smoking Status Former  Smokeless Tobacco Former   Types: Chew   Quit date: 01/27/1973  Tobacco Comments   chewing     Social History   Substance and Sexual Activity  Alcohol Use No     Allergies  Allergen Reactions   Ciprofloxacin Rash   Oxycodone-Acetaminophen  Rash and Other (See Comments)    Felt like he was hot   Oxycodone-Acetaminophen  Rash      Current Outpatient Medications  Medication Sig Dispense Refill   ALPRAZolam   (XANAX ) 0.5 MG tablet Take 1 tablet (0.5 mg total) by mouth at bedtime as needed for sleep (only for use during sleep study, patient needs designated driver in AM). 2 tablet 0   Ascorbic Acid (VITAMIN C) 1000 MG tablet Take 1,000 mg by mouth daily.     atorvastatin  (LIPITOR) 80 MG tablet Take 1 tablet (80 mg total) by mouth daily. 90 tablet 3   Cholecalciferol (VITAMIN D) 50 MCG (2000 UT) CAPS Take 2,000 Units by mouth daily.     cyanocobalamin (VITAMIN B12) 1000 MCG tablet Take by mouth daily.     diclofenac Sodium (VOLTAREN) 1 % GEL Apply 2 g topically daily as needed (Knee pain).     DULoxetine  (CYMBALTA ) 60 MG capsule Take 1 capsule (60 mg total) by mouth daily. 90 capsule 1   ELIQUIS  5 MG TABS tablet TAKE (1) TABLET BY MOUTH TWICE DAILY. 60 tablet 5   lisinopril  (ZESTRIL ) 2.5 MG tablet Take 1 tablet (2.5 mg total) by mouth daily. 90 tablet 3   Omega-3 Fatty Acids (FISH OIL) 1000 MG CAPS Take 1,000 mg by mouth daily.     pantoprazole  (PROTONIX ) 40 MG tablet Take 1 tablet (40 mg total) by mouth daily. 90 tablet 0   Polyethyl Glycol-Propyl Glycol (SYSTANE ULTRA) 0.4-0.3 % SOLN Place 1 drop into both eyes daily as needed (Dry eyes).     Probiotic Product (ALIGN) 10 MG CAPS Take 10 mg by mouth every morning.     tamsulosin  (FLOMAX ) 0.4 MG CAPS capsule Take 1 capsule (0.4 mg total) by mouth daily. 90 capsule 3   zinc gluconate 50 MG tablet Take 50 mg by mouth daily.     zonisamide  (ZONEGRAN ) 100 MG capsule TAKE (2) CAPSULES BY MOUTH ONCE DAILY. 180 capsule 4   No current facility-administered medications for this visit.    (Not in a hospital admission)   Family History  Problem Relation Age of Onset   Heart attack Father        mi I N HIS 40'S BUT LIVED INTO HIS 90'S WITH COPD   COPD Father    Cancer Father        colon   Other Mother 61       died multiple med problems   Heart disease Mother    Hypertension Mother    Heart attack Sister 40   Seizures Neg Hx    Transient ischemic  attack Neg Hx    Stroke Neg Hx      Review of Systems:   Review of Systems  Constitutional:  Positive for malaise/fatigue. Negative for weight loss.  Respiratory:  Positive for shortness of breath.   Cardiovascular:  Negative for chest pain, orthopnea and leg swelling.  Gastrointestinal:  Negative for nausea and vomiting.  Neurological:  Positive for dizziness. Negative for headaches.  Physical Exam: BP (!) 144/90   Pulse 80   Resp 20   Ht 5' 10 (1.778 m)   Wt 300 lb (136.1 kg)   SpO2 96% Comment: RA  BMI 43.05 kg/m  Physical Exam Constitutional:      Appearance: Normal appearance. He is obese.  Cardiovascular:     Rate and Rhythm: Normal rate. Rhythm irregular.     Heart sounds: Murmur heard.  Pulmonary:     Effort: Pulmonary effort is normal.     Breath sounds: Normal breath sounds.  Abdominal:     General: There is no distension.     Palpations: Abdomen is soft.     Tenderness: There is no abdominal tenderness.  Musculoskeletal:        General: No swelling.  Skin:    General: Skin is warm and dry.  Neurological:     General: No focal deficit present.     Mental Status: He is alert and oriented to person, place, and time.       Cardiac Studies & Procedures   ______________________________________________________________________________________________ CARDIAC CATHETERIZATION  CARDIAC CATHETERIZATION 01/18/2024  Conclusion   Prox RCA to Mid RCA lesion is 30% stenosed.   Dist RCA lesion is 50% stenosed with 50% stenosed side branch in RPDA.   RPAV lesion is 70% stenosed.   Mid LM to Dist LM lesion is 40% stenosed.   Prox LAD to Mid LAD lesion is 60% stenosed.  1.  Calcified, restricted aortic valve on fluoroscopy consistent with the patient's known history of severe aortic stenosis 2.  Right heart catheterization data: RA mean 2 mmHg RV 23/7 mmHg PA 26/19 mean 24 mmHg Pulmonary capillary wedge pressure A-wave 12 mmHg, V wave 13 mmHg, mean 13  mmHg Cardiac output/cardiac index 5.65/2.28 3.  Mild distal left main stenosis of 30 to 40% 4.  Moderate mid LAD stenosis of 60 to 70% 5.  Patent, small left circumflex with patent intermediate branch 6.  Large, dominant RCA with mild nonobstructive plaquing and moderate distal bifurcation stenosis involving the distal RCA, PDA, and PLA branches  Recommendations: Medical therapy for coronary artery disease, continue TAVR evaluation  Findings Coronary Findings Diagnostic  Dominance: Right  Left Main The left main is patent with mild 30 to 40% distal left main stenosis extending into the ostium of the LAD Mid LM to Dist LM lesion is 40% stenosed.  Left Anterior Descending The LAD has an eccentric 60 to 70% plaque in the mid vessel after the first septal perforator. Prox LAD to Mid LAD lesion is 60% stenosed.  Ramus Intermedius There is mild diffuse disease throughout the vessel.  Left Circumflex Vessel is small. There is mild diffuse disease throughout the vessel. The circumflex is small.  It supplies a single very small OM branch.  The intermediate branch is moderate in caliber with no significant stenosis.  First Obtuse Marginal Branch Vessel is small in size.  Right Coronary Artery There is mild diffuse disease throughout the vessel. The vessel is moderately calcified. Large, dominant vessel.  The mid vessel has mild 30 to 40% stenosis.  The distal vessel is calcified at the bifurcation of the PDA and posterior AV segment with 50% stenosis in each of the branch vessels ([place] 1, 1, 1) and a second 70% stenosis in the PLV branch. Prox RCA to Mid RCA lesion is 30% stenosed. Dist RCA lesion is 50% stenosed with 50% stenosed side branch in RPDA.  Right Posterior Atrioventricular Artery RPAV lesion is 70% stenosed.  Intervention  No interventions have been documented.   STRESS TESTS  MYOCARDIAL PERFUSION IMAGING 09/22/2017  Interpretation Summary  Nuclear stress EF: 64%.  The left ventricular ejection fraction is normal (55-65%).  The study is normal.  This is a low risk study. no ischemia. no evidence of infarction   ECHOCARDIOGRAM  ECHOCARDIOGRAM COMPLETE 12/21/2023  Narrative ECHOCARDIOGRAM REPORT    Patient Name:   MADOC HOLQUIN Date of Exam: 12/21/2023 Medical Rec #:  [id number]     Height:       70.5 in Accession #:    [id number]    Weight:       300.0 lb Date of Birth:  1940/05/05      BSA:          2.493 m Patient Age:    83 years      BP:           123/84 mmHg Patient Gender: M             HR:           81 bpm. Exam Location:  [facility]  Procedure: 2D Echo, Cardiac Doppler and Color Doppler (Both Spectral and Color Flow Doppler were utilized during procedure).  Indications:    Atrial Fibrillation-Persistent atrial fibrillation  History:        Patient has prior history of Echocardiogram examinations, most recent 02/03/2023. CAD, Arrythmias:Atrial Fibrillation; Risk Factors:Dyslipidemia, Hypertension and Prediabetes.  Sonographer:    Aida Pizza RCS Referring Phys: [phone] SCOTT A LUKING  IMPRESSIONS   1. Left ventricular ejection fraction, by estimation, is 70 to 75%. The left ventricle has hyperdynamic function. The left ventricle has no regional wall motion abnormalities. There is moderate asymmetric left ventricular hypertrophy of the basal and septal segments. Left ventricular diastolic function could not be evaluated. 2. Right ventricular systolic function is normal. The right ventricular size is normal. Tricuspid regurgitation signal is inadequate for assessing PA pressure. 3. Left atrial size was severely dilated. 4. Right atrial size was severely dilated. 5. The mitral valve is abnormal. Mild mitral valve regurgitation. No evidence of mitral stenosis. Severe mitral annular calcification. 6. The aortic valve is tricuspid. There is severe calcifcation of the aortic valve. There is severe thickening of the aortic valve. Severe  restriction of aortic valve leaflets. Aortic valve regurgitation is moderate. Paradoxical low flow-low gradient severe aortic valve stenosis. Stroke volume index is 27.27 (normal in 01/2023). Aortic valve area, by VTI measures 0.86 cm. Aortic valve mean gradient measures 29.0 mmHg. Aortic valve Vmax measures 3.45 m/s. DVI is 0.30. 7. The inferior vena cava is normal in size with greater than 50% respiratory variability, suggesting right atrial pressure of 3 mmHg.  Comparison(s): Changes from prior study are noted. Pardoxical low flow-low gradient severe AS with reduced SVI 27.27 now compared to moderate AS with normal SVI in 2024.  FINDINGS Left Ventricle: Left ventricular ejection fraction, by estimation, is 70 to 75%. The left ventricle has hyperdynamic function. The left ventricle has no regional wall motion abnormalities. Strain was performed and the global longitudinal strain is indeterminate. The left ventricular internal cavity size was normal in size. There is moderate asymmetric left ventricular hypertrophy of the basal and septal segments. Left ventricular diastolic function could not be evaluated due to atrial fibrillation. Left ventricular diastolic function could not be evaluated.  Right Ventricle: The right ventricular size is normal. No increase in right ventricular wall thickness. Right ventricular systolic function is normal. Tricuspid regurgitation signal is inadequate  for assessing PA pressure.  Left Atrium: Left atrial size was severely dilated.  Right Atrium: Right atrial size was severely dilated.  Pericardium: There is no evidence of pericardial effusion.  Mitral Valve: The mitral valve is abnormal. Severe mitral annular calcification. Mild mitral valve regurgitation. No evidence of mitral valve stenosis.  Tricuspid Valve: The tricuspid valve is normal in structure. Tricuspid valve regurgitation is not demonstrated. No evidence of tricuspid stenosis.  Aortic Valve: The  aortic valve is tricuspid. There is severe calcifcation of the aortic valve. There is severe thickening of the aortic valve. Aortic valve regurgitation is moderate. Moderate to severe aortic stenosis is present. Aortic valve mean gradient measures 29.0 mmHg. Aortic valve peak gradient measures 47.5 mmHg. Aortic valve area, by VTI measures 0.86 cm.  Pulmonic Valve: The pulmonic valve was normal in structure. Pulmonic valve regurgitation is not visualized. No evidence of pulmonic stenosis.  Aorta: The aortic root is normal in size and structure.  Venous: The inferior vena cava is normal in size with greater than 50% respiratory variability, suggesting right atrial pressure of 3 mmHg.  IAS/Shunts: No atrial level shunt detected by color flow Doppler.  Additional Comments: 3D was performed not requiring image post processing on an independent workstation and was indeterminate.   LEFT VENTRICLE PLAX 2D LVIDd:         4.50 cm LVIDs:         2.30 cm LV PW:         1.30 cm LV IVS:        1.10 cm LVOT diam:     2.00 cm LV SV:         68 LV SV Index:   27 LVOT Area:     3.14 cm   RIGHT VENTRICLE RV S prime:     11.60 cm/s TAPSE (M-mode): 2.1 cm  LEFT ATRIUM              Index        RIGHT ATRIUM           Index LA diam:        5.70 cm  2.29 cm/m   RA Area:     30.70 cm LA Vol (A2C):   131.0 ml 52.55 ml/m  RA Volume:   114.00 ml 45.73 ml/m LA Vol (A4C):   151.0 ml 60.57 ml/m LA Biplane Vol: 152.0 ml 60.97 ml/m AORTIC VALVE AV Area (Vmax):    0.95 cm AV Area (Vmean):   0.93 cm AV Area (VTI):     0.86 cm AV Vmax:           344.75 cm/s AV Vmean:          249.000 cm/s AV VTI:            0.792 m AV Peak Grad:      47.5 mmHg AV Mean Grad:      29.0 mmHg LVOT Vmax:         104.00 cm/s LVOT Vmean:        73.800 cm/s LVOT VTI:          0.218 m LVOT/AV VTI ratio: 0.28  AORTA Ao Root diam: 3.80 cm  MITRAL VALVE MV Area (PHT): 2.56 cm     SHUNTS MV Decel Time: 296 msec      Systemic VTI:  0.22 m MR Peak grad: 139.2 mmHg    Systemic Diam: 2.00 cm MR Mean grad: 83.0 mmHg MR Vmax:  590.00 cm/s MR Vmean:     425.0 cm/s MV E velocity: 132.00 cm/s  Vishnu Priya Mallipeddi Electronically signed by Diannah Late Mallipeddi Signature Date/Time: 12/21/2023/3:57:37 PM    Final    MONITORS  LONG TERM MONITOR (3-14 DAYS) 05/02/2019  Narrative 1.  The basic rhythm is atrial fibrillation with an average ventricular rate of 75 bpm, atrial fibrillation burden 100% 2.  Occasional wide-complex beats, PVCs versus aberrancy 3.  No high-grade heart block or pathologic pauses greater than 3 seconds   CT SCANS  CT CORONARY MORPH W/CTA COR W/SCORE 01/26/2024  Addendum 02/03/2024 11:41 PM ADDENDUM REPORT: 02/03/2024 23:39  EXAM: OVER-READ INTERPRETATION  CT CHEST  The following report is an over-read performed by radiologist Dr. Oneil Devonshire of [facility] on 02/03/2024. This over-read does not include interpretation of cardiac or coronary anatomy or pathology. The coronary calcium  score/coronary CTA interpretation by the cardiologist is attached.  COMPARISON:  CT of the chest from earlier in the same day.  FINDINGS: Cardiovascular: Dilatation of the ascending aorta to 4.5 cm is noted. No dissection is noted. Atherosclerotic calcifications seen.  Mediastinum/Nodes: There are no enlarged lymph nodes within the visualized mediastinum.  Lungs/Pleura: There is no pleural effusion. The visualized lungs appear clear.  Upper abdomen: No significant findings in the visualized upper abdomen.  Musculoskeletal/Chest wall: No chest wall mass or suspicious osseous findings within the visualized chest.  IMPRESSION: Dilatation of the ascending aorta is noted to 4.5 cm. Ascending thoracic aortic aneurysm. Recommend semi-annual imaging followup by CTA or MRA and referral to cardiothoracic surgery if not already obtained. This recommendation follows  2010 ACCF/AHA/AATS/ACR/ASA/SCA/SCAI/SIR/STS/SVM Guidelines for the Diagnosis and Management of Patients With Thoracic Aortic Disease. Circulation. 2010; 121: Z733-z630. Aortic aneurysm NOS (ICD10-I71.9)   Electronically Signed By: Oneil Devonshire M.D. On: 02/03/2024 23:39  Narrative CLINICAL DATA:  Severe Aortic Stenosis.  EXAM: Cardiac TAVR CT  TECHNIQUE: A non-contrast, gated CT scan was obtained with axial slices of 2.5 mm through the heart for aortic valve scoring. A 120 kV retrospective, gated, contrast cardiac scan was obtained. Gantry rotation speed was 230 msec and collimation was 0.63 mm. Nitroglycerin was not given. A delayed scan was obtained to exclude left atrial appendage thrombus. The 3D dataset was reconstructed in systole with motion correction. The 3D data set was reconstructed in 5% intervals of the 0-95% of the R-R cycle. Systolic and diastolic phases were analyzed on a dedicated workstation using MPR, MIP, and VRT modes. The patient received 100 cc of contrast.  FINDINGS: Aortic Valve: Valve Morphology: Tricuspid with symmetric calcium  distribution and reduced excursion the planimeter valve area is 1.20 sq cm consistent with moderate to severe aortic stenosis  Annular and subannular calcification: Annular and sub-annular with single calcified protuberance that extends into the LVOT  Aortic Valve Calcium  score: 2485  Perimembranous septal diameter: 6 mm  Mitral Valve: Moderate mitral annular calcification  Aortic Annulus Measurements-25% Phase  Major annulus diameter: 33 mm  Minor annulus diameter:26 mm  Annular perimeter: 92 mm  Annular area: 6.30 cm2  Aortic Measurements- 75% phase  Sinotubular Junction: 37 mm  Ascending Thoracic Aorta: 47 mm - moderate dilation  Aortic atherosclerosis.  Sinus of Valsalva Measurements:  Right coronary cusp width: 35 mm  Left coronary cusp width: 35 mm  Non coronary cusp width: 35 mm  Coronary  Artery Height above Annulus:  Left Main: 20 mm  Left SoV height: 26 mm  Right Coronary: 22 mm  Right SoV height: 26 mm  Optimum Fluoroscopic Angle for Delivery: LAO 2, CAU 2  Valves for structural team consideration:  29 mm Sapien  34 mm Evolut  Non TAVR Valve Findings:  Coronary Arteries: Normal coronary origin. Study not completed with nitroglycerin. With this caveat:  Moderate RCA disease mid RCA.  Calcified proximal LAD disease- lack of nitroglycerin- correlation to heart catheterization advised.  Calcified ostial LCX disease- lack of nitroglycerin- correlation to heart catheterization advised.  Coronary Calcium  Score:  Left main: 0  Left anterior descending artery: 920  Left circumflex artery: 72  Right coronary artery: 1357  Total: 2350  Percentile: 84 for age, sex, and race matched control.  Systemic veins: Normal anatomy.  Main Pulmonary artery: Mild dilation, 29 mm.  Pulmonary veins: Not well visualized.  Left atrial appendage: Incomplete opacification of the distal tip; suspect delayed opacification but cannot rule out thrombus due to lack of delayed phase.  Interatrial septum: No clear communication.  Chamber dimensions: Right ventricular dilation; bi-atrial dilation.  Pericardium: No calcification.  Extra Cardiac Findings as per separate reporting.  Notable artifacts: Poor contrast opacification  Image quality: Fair  IMPRESSION: 1. Severe Aortic stenosis. Measurements for potential interventions as above.  Stanly Leavens MD  Electronically Signed: By: Stanly Leavens M.D. On: 01/26/2024 14:55     ______________________________________________________________________________________________      ECG Rate-controlled atrial fibrillation    I have independently reviewed the above radiologic studies and discussed with the patient   Recent Lab Findings: Lab Results  Component Value Date   WBC 7.8 01/14/2024    HGB 13.3 01/18/2024   HGB 12.9 (L) 01/18/2024   HCT 39.0 01/18/2024   HCT 38.0 (L) 01/18/2024   PLT 183 01/14/2024   GLUCOSE 92 01/14/2024   CHOL 126 11/30/2023   TRIG 88 11/30/2023   HDL 40 11/30/2023   LDLCALC 69 11/30/2023   ALT 13 11/30/2023   AST 17 11/30/2023   NA 139 01/18/2024   NA 134 (L) 01/18/2024   K 4.0 01/18/2024   K 3.7 01/18/2024   CL 104 01/14/2024   CREATININE 1.07 01/14/2024   BUN 27 01/14/2024   CO2 22 01/14/2024   TSH 3.040 09/03/2017   INR 0.91 02/24/2012   HGBA1C 6.0 (H) 11/30/2023      Assessment / Plan:   83 y.o. male with severe aortic stenosis.  STS score: 4.19%.  NYHA Class II.  The risks and benefits of transfemoral TAVR were discussed in detail.  He has adequate transfemoral access but is obese so may use radial for secondary access to minimize risks associated with femoral puncture.  The risks included death, stroke, paravalvular leak, aortic dissection, annulus rupture, device embolization, acute myocardial infarction, arrhythmia, heart block or need for permanent pacemaker.  We also discussed possibility of an emergent sternotomy to address any procedural complications.  Based on our discussion, we collectively decided that an emergent sternotomy would be indicated.  The patient is agreeable to proceed.  Based on my review of his LHC, echo, and CTA, I agree with the multidisciplinary plan to proceed with a transfemoral 29 mm S3 TAVR.   I  spent 30 minutes counseling the patient face to face.   Con RAMAN Ingrid Shifrin 03/03/2024 9:24 AM

## 2024-03-03 NOTE — Progress Notes (Signed)
 Pre Surgical Assessment: 5 M Walk Test  73M=16.22ft  5 Meter Walk Test- trial 1: 5.17 seconds 5 Meter Walk Test- trial 2: 7.44 seconds 5 Meter Walk Test- trial 3: 7.18 seconds 5 Meter Walk Test Average: 6.59 seconds

## 2024-03-03 NOTE — Progress Notes (Signed)
 [facility]   MULTIDISCIPLINARY HEART VALVE CLINIC    ATARI NOVICK [facility] Medical Record #[id number] Date of Birth: 01/17/1941  Referring: Wonda Sharper, MD Primary Care: Alphonsa Glendia LABOR, MD Primary Cardiologist:Michael Wonda, MD  Chief Complaint:    Chief Complaint  Patient presents with   Aortic Stenosis    TAVR Consult/ review all testing    History of Present Illness:     Steven [name] is a 83 y.o. male presents for evaluation of severe aortic stenosis and TAVR.  His main complaints are dizziness, fatigue and dyspnea.  He notes that even in the reception area, he took a step backwards and felt like he might fall.  He is very functional and independent in his ADLs, although he does admit he has not been exercising at all and has gotten quite lazy.  He has a history of CAD, TAA, HTN, HL and Afib on Eliquis .  His ECHO recently demonstrated EF 70%, severe MAC with pLFLG AS w/ MG 29 mmHg, Vmax 3.44, AVA 0.86.  My measurements: Annulus: Area - 618, Perimeter 90.1, Dia 28.  Subannular calcium  below NCC that extends into LVOT RCA 17.2, LCA 18.2 mm SOV 35.1-36.7 mm Transfemoral access is appropriate size although quite deep   Past Medical History:  Diagnosis Date   Chronic pain    Complication of anesthesia    pt woke up during last surgery    Coronary artery disease 02-27-11   Dr. Wonda slain follows-has some blocked coronary arteries ,not suitable for stent  placement   Dyslipidemia    Dysrhythmia    hx of atrial fib during last hospitalization    GERD (gastroesophageal reflux disease) 02-27-11   Acid reflux   Hearing loss 02-27-11   Bilateral hearing aids-due to exposure to loud machinery   Hemorrhoids 02-27-11   not bothersome at this time   Hyperlipidemia    Hypertension    Leg swelling    Obesity    Osteoarthritis 02-27-11   Ostearthritis-knees, shoulders.Back causes chronic pain-radiates down right leg   Rash    Seizures (HCC)  07/2014   Shingles outbreak 02-27-11   2 weeks ago , was tx.-only residual is tenderness of right scalp-no open areas   Shortness of breath    with exertion    Urinary incontinence 02-27-11   not an everday occurrence-no special measures    Past Surgical History:  Procedure Laterality Date   CHOLECYSTECTOMY  03/14/2011   Procedure: LAPAROSCOPIC CHOLECYSTECTOMY;  Surgeon: Morene ONEIDA Olives, MD;  Location: [facility];  Service: General;  Laterality: N/A;   COLONOSCOPY     INJECTION KNEE  03/05/2011   Procedure: KNEE INJECTION;  Surgeon: Dempsey LULLA Moan;  Location: [facility];  Service: Orthopedics;  Laterality: Left;  80 mg depomedrol   LAPAROSCOPY  03/16/2011   Procedure: LAPAROSCOPY DIAGNOSTIC;  Surgeon: Redell Alm Faith, DO;  Location: [facility];  Service: General;  Laterality: N/A;  repair incisional hernia   REPLACEMENT TOTAL KNEE  02-27-11   right- 2007   RIGHT HEART CATH AND CORONARY ANGIOGRAPHY N/A 01/18/2024   Procedure: RIGHT HEART CATH AND CORONARY ANGIOGRAPHY;  Surgeon: Wonda Sharper, MD;  Location: [facility]IVE CV [facility];  Service: Cardiovascular;  Laterality: N/A;   TOTAL KNEE ARTHROPLASTY  03/01/2012   Procedure: TOTAL KNEE ARTHROPLASTY;  Surgeon: Dempsey LULLA Moan, MD;  Location: [facility];  Service: Orthopedics;  Laterality: Left;   TOTAL KNEE REVISION  03/05/2011   Procedure: TOTAL KNEE REVISION;  Surgeon: Dempsey  V Aluisio;  Location: [facility];  Service: Orthopedics;  Laterality: Right;    Social History:  Social History   Tobacco Use  Smoking Status Former  Smokeless Tobacco Former   Types: Chew   Quit date: 01/27/1973  Tobacco Comments   chewing     Social History   Substance and Sexual Activity  Alcohol Use No     Allergies  Allergen Reactions   Ciprofloxacin Rash   Oxycodone-Acetaminophen  Rash and Other (See Comments)    Felt like he was hot   Oxycodone-Acetaminophen  Rash      Current Outpatient Medications  Medication Sig Dispense Refill   ALPRAZolam   (XANAX ) 0.5 MG tablet Take 1 tablet (0.5 mg total) by mouth at bedtime as needed for sleep (only for use during sleep study, patient needs designated driver in AM). 2 tablet 0   Ascorbic Acid (VITAMIN C) 1000 MG tablet Take 1,000 mg by mouth daily.     atorvastatin  (LIPITOR) 80 MG tablet Take 1 tablet (80 mg total) by mouth daily. 90 tablet 3   Cholecalciferol (VITAMIN D) 50 MCG (2000 UT) CAPS Take 2,000 Units by mouth daily.     cyanocobalamin (VITAMIN B12) 1000 MCG tablet Take by mouth daily.     diclofenac Sodium (VOLTAREN) 1 % GEL Apply 2 g topically daily as needed (Knee pain).     DULoxetine  (CYMBALTA ) 60 MG capsule Take 1 capsule (60 mg total) by mouth daily. 90 capsule 1   ELIQUIS  5 MG TABS tablet TAKE (1) TABLET BY MOUTH TWICE DAILY. 60 tablet 5   lisinopril  (ZESTRIL ) 2.5 MG tablet Take 1 tablet (2.5 mg total) by mouth daily. 90 tablet 3   Omega-3 Fatty Acids (FISH OIL) 1000 MG CAPS Take 1,000 mg by mouth daily.     pantoprazole  (PROTONIX ) 40 MG tablet Take 1 tablet (40 mg total) by mouth daily. 90 tablet 0   Polyethyl Glycol-Propyl Glycol (SYSTANE ULTRA) 0.4-0.3 % SOLN Place 1 drop into both eyes daily as needed (Dry eyes).     Probiotic Product (ALIGN) 10 MG CAPS Take 10 mg by mouth every morning.     tamsulosin  (FLOMAX ) 0.4 MG CAPS capsule Take 1 capsule (0.4 mg total) by mouth daily. 90 capsule 3   zinc gluconate 50 MG tablet Take 50 mg by mouth daily.     zonisamide  (ZONEGRAN ) 100 MG capsule TAKE (2) CAPSULES BY MOUTH ONCE DAILY. 180 capsule 4   No current facility-administered medications for this visit.    (Not in a hospital admission)   Family History  Problem Relation Age of Onset   Heart attack Father        mi I N HIS 40'S BUT LIVED INTO HIS 90'S WITH COPD   COPD Father    Cancer Father        colon   Other Mother 61       died multiple med problems   Heart disease Mother    Hypertension Mother    Heart attack Sister 40   Seizures Neg Hx    Transient ischemic  attack Neg Hx    Stroke Neg Hx      Review of Systems:   Review of Systems  Constitutional:  Positive for malaise/fatigue. Negative for weight loss.  Respiratory:  Positive for shortness of breath.   Cardiovascular:  Negative for chest pain, orthopnea and leg swelling.  Gastrointestinal:  Negative for nausea and vomiting.  Neurological:  Positive for dizziness. Negative for headaches.  Physical Exam: BP (!) 144/90   Pulse 80   Resp 20   Ht 5' 10 (1.778 m)   Wt 300 lb (136.1 kg)   SpO2 96% Comment: RA  BMI 43.05 kg/m  Physical Exam Constitutional:      Appearance: Normal appearance. He is obese.  Cardiovascular:     Rate and Rhythm: Normal rate. Rhythm irregular.     Heart sounds: Murmur heard.  Pulmonary:     Effort: Pulmonary effort is normal.     Breath sounds: Normal breath sounds.  Abdominal:     General: There is no distension.     Palpations: Abdomen is soft.     Tenderness: There is no abdominal tenderness.  Musculoskeletal:        General: No swelling.  Skin:    General: Skin is warm and dry.  Neurological:     General: No focal deficit present.     Mental Status: He is alert and oriented to person, place, and time.       Cardiac Studies & Procedures   ______________________________________________________________________________________________ CARDIAC CATHETERIZATION  CARDIAC CATHETERIZATION 01/18/2024  Conclusion   Prox RCA to Mid RCA lesion is 30% stenosed.   Dist RCA lesion is 50% stenosed with 50% stenosed side branch in RPDA.   RPAV lesion is 70% stenosed.   Mid LM to Dist LM lesion is 40% stenosed.   Prox LAD to Mid LAD lesion is 60% stenosed.  1.  Calcified, restricted aortic valve on fluoroscopy consistent with the patient's known history of severe aortic stenosis 2.  Right heart catheterization data: RA mean 2 mmHg RV 23/7 mmHg PA 26/19 mean 24 mmHg Pulmonary capillary wedge pressure A-wave 12 mmHg, V wave 13 mmHg, mean 13  mmHg Cardiac output/cardiac index 5.65/2.28 3.  Mild distal left main stenosis of 30 to 40% 4.  Moderate mid LAD stenosis of 60 to 70% 5.  Patent, small left circumflex with patent intermediate branch 6.  Large, dominant RCA with mild nonobstructive plaquing and moderate distal bifurcation stenosis involving the distal RCA, PDA, and PLA branches  Recommendations: Medical therapy for coronary artery disease, continue TAVR evaluation  Findings Coronary Findings Diagnostic  Dominance: Right  Left Main The left main is patent with mild 30 to 40% distal left main stenosis extending into the ostium of the LAD Mid LM to Dist LM lesion is 40% stenosed.  Left Anterior Descending The LAD has an eccentric 60 to 70% plaque in the mid vessel after the first septal perforator. Prox LAD to Mid LAD lesion is 60% stenosed.  Ramus Intermedius There is mild diffuse disease throughout the vessel.  Left Circumflex Vessel is small. There is mild diffuse disease throughout the vessel. The circumflex is small.  It supplies a single very small OM branch.  The intermediate branch is moderate in caliber with no significant stenosis.  First Obtuse Marginal Branch Vessel is small in size.  Right Coronary Artery There is mild diffuse disease throughout the vessel. The vessel is moderately calcified. Large, dominant vessel.  The mid vessel has mild 30 to 40% stenosis.  The distal vessel is calcified at the bifurcation of the PDA and posterior AV segment with 50% stenosis in each of the branch vessels ([place] 1, 1, 1) and a second 70% stenosis in the PLV branch. Prox RCA to Mid RCA lesion is 30% stenosed. Dist RCA lesion is 50% stenosed with 50% stenosed side branch in RPDA.  Right Posterior Atrioventricular Artery RPAV lesion is 70% stenosed.  Intervention  No interventions have been documented.   STRESS TESTS  MYOCARDIAL PERFUSION IMAGING 09/22/2017  Interpretation Summary  Nuclear stress EF: 64%.  The left ventricular ejection fraction is normal (55-65%).  The study is normal.  This is a low risk study. no ischemia. no evidence of infarction   ECHOCARDIOGRAM  ECHOCARDIOGRAM COMPLETE 12/21/2023  Narrative ECHOCARDIOGRAM REPORT    Patient Name:   Steven [name] Date of Exam: 12/21/2023 Medical Rec #:  [id number]     Height:       70.5 in Accession #:    [id number]    Weight:       300.0 lb Date of Birth:  1940/05/05      BSA:          2.493 m Patient Age:    83 years      BP:           123/84 mmHg Patient Gender: M             HR:           81 bpm. Exam Location:  [facility]  Procedure: 2D Echo, Cardiac Doppler and Color Doppler (Both Spectral and Color Flow Doppler were utilized during procedure).  Indications:    Atrial Fibrillation-Persistent atrial fibrillation  History:        Patient has prior history of Echocardiogram examinations, most recent 02/03/2023. CAD, Arrythmias:Atrial Fibrillation; Risk Factors:Dyslipidemia, Hypertension and Prediabetes.  Sonographer:    Aida Pizza RCS Referring Phys: [phone] SCOTT A LUKING  IMPRESSIONS   1. Left ventricular ejection fraction, by estimation, is 70 to 75%. The left ventricle has hyperdynamic function. The left ventricle has no regional wall motion abnormalities. There is moderate asymmetric left ventricular hypertrophy of the basal and septal segments. Left ventricular diastolic function could not be evaluated. 2. Right ventricular systolic function is normal. The right ventricular size is normal. Tricuspid regurgitation signal is inadequate for assessing PA pressure. 3. Left atrial size was severely dilated. 4. Right atrial size was severely dilated. 5. The mitral valve is abnormal. Mild mitral valve regurgitation. No evidence of mitral stenosis. Severe mitral annular calcification. 6. The aortic valve is tricuspid. There is severe calcifcation of the aortic valve. There is severe thickening of the aortic valve. Severe  restriction of aortic valve leaflets. Aortic valve regurgitation is moderate. Paradoxical low flow-low gradient severe aortic valve stenosis. Stroke volume index is 27.27 (normal in 01/2023). Aortic valve area, by VTI measures 0.86 cm. Aortic valve mean gradient measures 29.0 mmHg. Aortic valve Vmax measures 3.45 m/s. DVI is 0.30. 7. The inferior vena cava is normal in size with greater than 50% respiratory variability, suggesting right atrial pressure of 3 mmHg.  Comparison(s): Changes from prior study are noted. Pardoxical low flow-low gradient severe AS with reduced SVI 27.27 now compared to moderate AS with normal SVI in 2024.  FINDINGS Left Ventricle: Left ventricular ejection fraction, by estimation, is 70 to 75%. The left ventricle has hyperdynamic function. The left ventricle has no regional wall motion abnormalities. Strain was performed and the global longitudinal strain is indeterminate. The left ventricular internal cavity size was normal in size. There is moderate asymmetric left ventricular hypertrophy of the basal and septal segments. Left ventricular diastolic function could not be evaluated due to atrial fibrillation. Left ventricular diastolic function could not be evaluated.  Right Ventricle: The right ventricular size is normal. No increase in right ventricular wall thickness. Right ventricular systolic function is normal. Tricuspid regurgitation signal is inadequate  for assessing PA pressure.  Left Atrium: Left atrial size was severely dilated.  Right Atrium: Right atrial size was severely dilated.  Pericardium: There is no evidence of pericardial effusion.  Mitral Valve: The mitral valve is abnormal. Severe mitral annular calcification. Mild mitral valve regurgitation. No evidence of mitral valve stenosis.  Tricuspid Valve: The tricuspid valve is normal in structure. Tricuspid valve regurgitation is not demonstrated. No evidence of tricuspid stenosis.  Aortic Valve: The  aortic valve is tricuspid. There is severe calcifcation of the aortic valve. There is severe thickening of the aortic valve. Aortic valve regurgitation is moderate. Moderate to severe aortic stenosis is present. Aortic valve mean gradient measures 29.0 mmHg. Aortic valve peak gradient measures 47.5 mmHg. Aortic valve area, by VTI measures 0.86 cm.  Pulmonic Valve: The pulmonic valve was normal in structure. Pulmonic valve regurgitation is not visualized. No evidence of pulmonic stenosis.  Aorta: The aortic root is normal in size and structure.  Venous: The inferior vena cava is normal in size with greater than 50% respiratory variability, suggesting right atrial pressure of 3 mmHg.  IAS/Shunts: No atrial level shunt detected by color flow Doppler.  Additional Comments: 3D was performed not requiring image post processing on an independent workstation and was indeterminate.   LEFT VENTRICLE PLAX 2D LVIDd:         4.50 cm LVIDs:         2.30 cm LV PW:         1.30 cm LV IVS:        1.10 cm LVOT diam:     2.00 cm LV SV:         68 LV SV Index:   27 LVOT Area:     3.14 cm   RIGHT VENTRICLE RV S prime:     11.60 cm/s TAPSE (M-mode): 2.1 cm  LEFT ATRIUM              Index        RIGHT ATRIUM           Index LA diam:        5.70 cm  2.29 cm/m   RA Area:     30.70 cm LA Vol (A2C):   131.0 ml 52.55 ml/m  RA Volume:   114.00 ml 45.73 ml/m LA Vol (A4C):   151.0 ml 60.57 ml/m LA Biplane Vol: 152.0 ml 60.97 ml/m AORTIC VALVE AV Area (Vmax):    0.95 cm AV Area (Vmean):   0.93 cm AV Area (VTI):     0.86 cm AV Vmax:           344.75 cm/s AV Vmean:          249.000 cm/s AV VTI:            0.792 m AV Peak Grad:      47.5 mmHg AV Mean Grad:      29.0 mmHg LVOT Vmax:         104.00 cm/s LVOT Vmean:        73.800 cm/s LVOT VTI:          0.218 m LVOT/AV VTI ratio: 0.28  AORTA Ao Root diam: 3.80 cm  MITRAL VALVE MV Area (PHT): 2.56 cm     SHUNTS MV Decel Time: 296 msec      Systemic VTI:  0.22 m MR Peak grad: 139.2 mmHg    Systemic Diam: 2.00 cm MR Mean grad: 83.0 mmHg MR Vmax:  590.00 cm/s MR Vmean:     425.0 cm/s MV E velocity: 132.00 cm/s  Vishnu Priya Mallipeddi Electronically signed by Diannah Late Mallipeddi Signature Date/Time: 12/21/2023/3:57:37 PM    Final    MONITORS  LONG TERM MONITOR (3-14 DAYS) 05/02/2019  Narrative 1.  The basic rhythm is atrial fibrillation with an average ventricular rate of 75 bpm, atrial fibrillation burden 100% 2.  Occasional wide-complex beats, PVCs versus aberrancy 3.  No high-grade heart block or pathologic pauses greater than 3 seconds   CT SCANS  CT CORONARY MORPH W/CTA COR W/SCORE 01/26/2024  Addendum 02/03/2024 11:41 PM ADDENDUM REPORT: 02/03/2024 23:39  EXAM: OVER-READ INTERPRETATION  CT CHEST  The following report is an over-read performed by radiologist Dr. Oneil Devonshire of [facility] on 02/03/2024. This over-read does not include interpretation of cardiac or coronary anatomy or pathology. The coronary calcium  score/coronary CTA interpretation by the cardiologist is attached.  COMPARISON:  CT of the chest from earlier in the same day.  FINDINGS: Cardiovascular: Dilatation of the ascending aorta to 4.5 cm is noted. No dissection is noted. Atherosclerotic calcifications seen.  Mediastinum/Nodes: There are no enlarged lymph nodes within the visualized mediastinum.  Lungs/Pleura: There is no pleural effusion. The visualized lungs appear clear.  Upper abdomen: No significant findings in the visualized upper abdomen.  Musculoskeletal/Chest wall: No chest wall mass or suspicious osseous findings within the visualized chest.  IMPRESSION: Dilatation of the ascending aorta is noted to 4.5 cm. Ascending thoracic aortic aneurysm. Recommend semi-annual imaging followup by CTA or MRA and referral to cardiothoracic surgery if not already obtained. This recommendation follows  2010 ACCF/AHA/AATS/ACR/ASA/SCA/SCAI/SIR/STS/SVM Guidelines for the Diagnosis and Management of Patients With Thoracic Aortic Disease. Circulation. 2010; 121: Z733-z630. Aortic aneurysm NOS (ICD10-I71.9)   Electronically Signed By: Oneil Devonshire M.D. On: 02/03/2024 23:39  Narrative CLINICAL DATA:  Severe Aortic Stenosis.  EXAM: Cardiac TAVR CT  TECHNIQUE: A non-contrast, gated CT scan was obtained with axial slices of 2.5 mm through the heart for aortic valve scoring. A 120 kV retrospective, gated, contrast cardiac scan was obtained. Gantry rotation speed was 230 msec and collimation was 0.63 mm. Nitroglycerin was not given. A delayed scan was obtained to exclude left atrial appendage thrombus. The 3D dataset was reconstructed in systole with motion correction. The 3D data set was reconstructed in 5% intervals of the 0-95% of the R-R cycle. Systolic and diastolic phases were analyzed on a dedicated workstation using MPR, MIP, and VRT modes. The patient received 100 cc of contrast.  FINDINGS: Aortic Valve: Valve Morphology: Tricuspid with symmetric calcium  distribution and reduced excursion the planimeter valve area is 1.20 sq cm consistent with moderate to severe aortic stenosis  Annular and subannular calcification: Annular and sub-annular with single calcified protuberance that extends into the LVOT  Aortic Valve Calcium  score: 2485  Perimembranous septal diameter: 6 mm  Mitral Valve: Moderate mitral annular calcification  Aortic Annulus Measurements-25% Phase  Major annulus diameter: 33 mm  Minor annulus diameter:26 mm  Annular perimeter: 92 mm  Annular area: 6.30 cm2  Aortic Measurements- 75% phase  Sinotubular Junction: 37 mm  Ascending Thoracic Aorta: 47 mm - moderate dilation  Aortic atherosclerosis.  Sinus of Valsalva Measurements:  Right coronary cusp width: 35 mm  Left coronary cusp width: 35 mm  Non coronary cusp width: 35 mm  Coronary  Artery Height above Annulus:  Left Main: 20 mm  Left SoV height: 26 mm  Right Coronary: 22 mm  Right SoV height: 26 mm  Optimum Fluoroscopic Angle for Delivery: LAO 2, CAU 2  Valves for structural team consideration:  29 mm Sapien  34 mm Evolut  Non TAVR Valve Findings:  Coronary Arteries: Normal coronary origin. Study not completed with nitroglycerin. With this caveat:  Moderate RCA disease mid RCA.  Calcified proximal LAD disease- lack of nitroglycerin- correlation to heart catheterization advised.  Calcified ostial LCX disease- lack of nitroglycerin- correlation to heart catheterization advised.  Coronary Calcium  Score:  Left main: 0  Left anterior descending artery: 920  Left circumflex artery: 72  Right coronary artery: 1357  Total: 2350  Percentile: 84 for age, sex, and race matched control.  Systemic veins: Normal anatomy.  Main Pulmonary artery: Mild dilation, 29 mm.  Pulmonary veins: Not well visualized.  Left atrial appendage: Incomplete opacification of the distal tip; suspect delayed opacification but cannot rule out thrombus due to lack of delayed phase.  Interatrial septum: No clear communication.  Chamber dimensions: Right ventricular dilation; bi-atrial dilation.  Pericardium: No calcification.  Extra Cardiac Findings as per separate reporting.  Notable artifacts: Poor contrast opacification  Image quality: Fair  IMPRESSION: 1. Severe Aortic stenosis. Measurements for potential interventions as above.  Stanly Leavens MD  Electronically Signed: By: Stanly Leavens M.D. On: 01/26/2024 14:55     ______________________________________________________________________________________________      ECG Rate-controlled atrial fibrillation    I have independently reviewed the above radiologic studies and discussed with the patient   Recent Lab Findings: Lab Results  Component Value Date   WBC 7.8 01/14/2024    HGB 13.3 01/18/2024   HGB 12.9 (L) 01/18/2024   HCT 39.0 01/18/2024   HCT 38.0 (L) 01/18/2024   PLT 183 01/14/2024   GLUCOSE 92 01/14/2024   CHOL 126 11/30/2023   TRIG 88 11/30/2023   HDL 40 11/30/2023   LDLCALC 69 11/30/2023   ALT 13 11/30/2023   AST 17 11/30/2023   NA 139 01/18/2024   NA 134 (L) 01/18/2024   K 4.0 01/18/2024   K 3.7 01/18/2024   CL 104 01/14/2024   CREATININE 1.07 01/14/2024   BUN 27 01/14/2024   CO2 22 01/14/2024   TSH 3.040 09/03/2017   INR 0.91 02/24/2012   HGBA1C 6.0 (H) 11/30/2023      Assessment / Plan:   83 y.o. male with severe aortic stenosis.  STS score: 4.19%.  NYHA Class II.  The risks and benefits of transfemoral TAVR were discussed in detail.  He has adequate transfemoral access but is obese so may use radial for secondary access to minimize risks associated with femoral puncture.  The risks included death, stroke, paravalvular leak, aortic dissection, annulus rupture, device embolization, acute myocardial infarction, arrhythmia, heart block or need for permanent pacemaker.  We also discussed possibility of an emergent sternotomy to address any procedural complications.  Based on our discussion, we collectively decided that an emergent sternotomy would be indicated.  The patient is agreeable to proceed.  Based on my review of his LHC, echo, and CTA, I agree with the multidisciplinary plan to proceed with a transfemoral 29 mm S3 TAVR.   I  spent 30 minutes counseling the patient face to face.   Con RAMAN Aveon Colquhoun 03/03/2024 9:24 AM

## 2024-03-04 ENCOUNTER — Other Ambulatory Visit: Payer: Self-pay

## 2024-03-04 DIAGNOSIS — I350 Nonrheumatic aortic (valve) stenosis: Secondary | ICD-10-CM

## 2024-03-07 NOTE — Telephone Encounter (Signed)
 Patient is scheduled at [facility] for 04/03/2024 at 8 pm.  Mailed packet and sent mychart.  CPAP HTA shara: [id number] (exp. 02/10/24 to 05/10/24) Per Dr. Chalice bed 2 & Xanax 

## 2024-03-11 ENCOUNTER — Other Ambulatory Visit: Payer: Self-pay

## 2024-03-11 ENCOUNTER — Ambulatory Visit (HOSPITAL_COMMUNITY)
Admission: RE | Admit: 2024-03-11 | Discharge: 2024-03-11 | Disposition: A | Source: Ambulatory Visit | Attending: Cardiovascular Disease | Admitting: Cardiovascular Disease

## 2024-03-11 ENCOUNTER — Encounter (HOSPITAL_COMMUNITY)
Admission: RE | Admit: 2024-03-11 | Discharge: 2024-03-11 | Disposition: A | Source: Ambulatory Visit | Attending: Cardiovascular Disease

## 2024-03-11 DIAGNOSIS — Z01818 Encounter for other preprocedural examination: Secondary | ICD-10-CM | POA: Insufficient documentation

## 2024-03-11 DIAGNOSIS — I3481 Nonrheumatic mitral (valve) annulus calcification: Secondary | ICD-10-CM | POA: Diagnosis not present

## 2024-03-11 DIAGNOSIS — I517 Cardiomegaly: Secondary | ICD-10-CM | POA: Diagnosis not present

## 2024-03-11 DIAGNOSIS — I350 Nonrheumatic aortic (valve) stenosis: Secondary | ICD-10-CM | POA: Diagnosis not present

## 2024-03-11 LAB — COMPREHENSIVE METABOLIC PANEL WITH GFR
ALT: 14 U/L (ref 0–44)
AST: 20 U/L (ref 15–41)
Albumin: 3.6 g/dL (ref 3.5–5.0)
Alkaline Phosphatase: 58 U/L (ref 38–126)
Anion gap: 10 (ref 5–15)
BUN: 21 mg/dL (ref 8–23)
CO2: 25 mmol/L (ref 22–32)
Calcium: 9.4 mg/dL (ref 8.9–10.3)
Chloride: 105 mmol/L (ref 98–111)
Creatinine, Ser: 1.06 mg/dL (ref 0.61–1.24)
GFR, Estimated: >60 mL/min (ref >60)
Glucose, Bld: 79 mg/dL (ref 70–99)
Potassium: 3.7 mmol/L (ref 3.5–5.1)
Sodium: 140 mmol/L (ref 135–145)
Total Bilirubin: 0.8 mg/dL (ref 0.0–1.2)
Total Protein: 6.6 g/dL (ref 6.5–8.1)

## 2024-03-11 LAB — TYPE AND SCREEN
ABO/RH(D): A POS
Antibody Screen: NEGATIVE

## 2024-03-11 LAB — URINALYSIS, ROUTINE W REFLEX MICROSCOPIC
Bilirubin Urine: NEGATIVE
Glucose, UA: NEGATIVE mg/dL
Hgb urine dipstick: NEGATIVE
Ketones, ur: NEGATIVE mg/dL
Leukocytes,Ua: NEGATIVE
Nitrite: NEGATIVE
Protein, ur: NEGATIVE mg/dL
Specific Gravity, Urine: 1.026 (ref 1.005–1.030)
pH: 5 (ref 5.0–8.0)

## 2024-03-11 LAB — PROTIME-INR
INR: 1.1 (ref 0.8–1.2)
Prothrombin Time: 14.4 s (ref 11.4–15.2)

## 2024-03-11 LAB — CBC
HCT: 48 % (ref 39.0–52.0)
Hemoglobin: 15.7 g/dL (ref 13.0–17.0)
MCH: 31.3 pg (ref 26.0–34.0)
MCHC: 32.7 g/dL (ref 30.0–36.0)
MCV: 95.8 fL (ref 80.0–100.0)
Platelets: 187 K/uL (ref 150–400)
RBC: 5.01 MIL/uL (ref 4.22–5.81)
RDW: 12.6 % (ref 11.5–15.5)
WBC: 7.6 K/uL (ref 4.0–10.5)
nRBC: 0 % (ref 0.0–0.2)

## 2024-03-11 LAB — SURGICAL PCR SCREEN
MRSA, PCR: NEGATIVE
Staphylococcus aureus: NEGATIVE

## 2024-03-11 NOTE — Progress Notes (Signed)
 All consents signed by patient at PAT lab appointment. Pt was sent home with printed copy of surgical instructions and CHG soap/CHG soap instructions. All instructions reviewed with patient and questions answered.  Patients chart send to anesthesia for review. Pt denies any respiratory illness/infection in the last two months.

## 2024-03-14 ENCOUNTER — Other Ambulatory Visit: Payer: Self-pay | Admitting: Family Medicine

## 2024-03-14 MED ORDER — POTASSIUM CHLORIDE 2 MEQ/ML IV SOLN
80.0000 meq | INTRAVENOUS | Status: DC
  Filled 2024-03-14: qty 40

## 2024-03-14 MED ORDER — HEPARIN 30,000 UNITS/1000 ML (OHS) CELLSAVER SOLUTION
Status: DC
  Filled 2024-03-14: qty 1000

## 2024-03-14 MED ORDER — NOREPINEPHRINE 4 MG/250ML-% IV SOLN
0.0000 ug/min | INTRAVENOUS | Status: AC
  Administered 2024-03-15: 2 ug/min via INTRAVENOUS
  Filled 2024-03-14: qty 250

## 2024-03-14 MED ORDER — MAGNESIUM SULFATE 50 % IJ SOLN
40.0000 meq | INTRAMUSCULAR | Status: DC
  Filled 2024-03-14: qty 9.85

## 2024-03-14 MED ORDER — CEFAZOLIN SODIUM-DEXTROSE 3-4 GM/150ML-% IV SOLN
3.0000 g | INTRAVENOUS | Status: AC
  Administered 2024-03-15: 3 g via INTRAVENOUS
  Filled 2024-03-14 (×3): qty 150

## 2024-03-14 MED ORDER — DEXMEDETOMIDINE HCL IN NACL 400 MCG/100ML IV SOLN
0.1000 ug/kg/h | INTRAVENOUS | Status: AC
  Administered 2024-03-15: 136.12 ug via INTRAVENOUS
  Administered 2024-03-15: 1 ug/kg/h via INTRAVENOUS
  Filled 2024-03-14: qty 100

## 2024-03-15 ENCOUNTER — Inpatient Hospital Stay (HOSPITAL_COMMUNITY)
Admission: RE | Admit: 2024-03-15 | Discharge: 2024-03-16 | DRG: 267 | Disposition: A | Attending: Cardiovascular Disease | Admitting: Cardiovascular Disease

## 2024-03-15 ENCOUNTER — Encounter (HOSPITAL_COMMUNITY): Admission: RE | Disposition: A | Payer: Self-pay | Source: Home / Self Care | Attending: Cardiovascular Disease

## 2024-03-15 ENCOUNTER — Inpatient Hospital Stay (HOSPITAL_COMMUNITY): Admitting: Anesthesiology

## 2024-03-15 ENCOUNTER — Inpatient Hospital Stay (HOSPITAL_COMMUNITY)

## 2024-03-15 ENCOUNTER — Inpatient Hospital Stay (HOSPITAL_COMMUNITY): Payer: Self-pay | Admitting: Vascular Surgery

## 2024-03-15 DIAGNOSIS — I10 Essential (primary) hypertension: Secondary | ICD-10-CM

## 2024-03-15 DIAGNOSIS — Z825 Family history of asthma and other chronic lower respiratory diseases: Secondary | ICD-10-CM | POA: Diagnosis not present

## 2024-03-15 DIAGNOSIS — Z7901 Long term (current) use of anticoagulants: Secondary | ICD-10-CM | POA: Diagnosis not present

## 2024-03-15 DIAGNOSIS — G8929 Other chronic pain: Secondary | ICD-10-CM | POA: Diagnosis present

## 2024-03-15 DIAGNOSIS — Z006 Encounter for examination for normal comparison and control in clinical research program: Secondary | ICD-10-CM

## 2024-03-15 DIAGNOSIS — Z79899 Other long term (current) drug therapy: Secondary | ICD-10-CM

## 2024-03-15 DIAGNOSIS — Z800 Family history of malignant neoplasm of digestive organs: Secondary | ICD-10-CM | POA: Diagnosis not present

## 2024-03-15 DIAGNOSIS — Z96653 Presence of artificial knee joint, bilateral: Secondary | ICD-10-CM | POA: Diagnosis present

## 2024-03-15 DIAGNOSIS — I447 Left bundle-branch block, unspecified: Secondary | ICD-10-CM | POA: Diagnosis not present

## 2024-03-15 DIAGNOSIS — I350 Nonrheumatic aortic (valve) stenosis: Secondary | ICD-10-CM

## 2024-03-15 DIAGNOSIS — E7800 Pure hypercholesterolemia, unspecified: Secondary | ICD-10-CM | POA: Diagnosis not present

## 2024-03-15 DIAGNOSIS — I4821 Permanent atrial fibrillation: Secondary | ICD-10-CM | POA: Diagnosis not present

## 2024-03-15 DIAGNOSIS — G40109 Localization-related (focal) (partial) symptomatic epilepsy and epileptic syndromes with simple partial seizures, not intractable, without status epilepticus: Secondary | ICD-10-CM | POA: Diagnosis present

## 2024-03-15 DIAGNOSIS — Z87891 Personal history of nicotine dependence: Secondary | ICD-10-CM

## 2024-03-15 DIAGNOSIS — Z881 Allergy status to other antibiotic agents status: Secondary | ICD-10-CM

## 2024-03-15 DIAGNOSIS — Z952 Presence of prosthetic heart valve: Principal | ICD-10-CM

## 2024-03-15 DIAGNOSIS — D649 Anemia, unspecified: Secondary | ICD-10-CM | POA: Diagnosis not present

## 2024-03-15 DIAGNOSIS — K219 Gastro-esophageal reflux disease without esophagitis: Secondary | ICD-10-CM | POA: Diagnosis present

## 2024-03-15 DIAGNOSIS — H9190 Unspecified hearing loss, unspecified ear: Secondary | ICD-10-CM | POA: Diagnosis not present

## 2024-03-15 DIAGNOSIS — I2510 Atherosclerotic heart disease of native coronary artery without angina pectoris: Secondary | ICD-10-CM

## 2024-03-15 DIAGNOSIS — Z6841 Body Mass Index (BMI) 40.0 and over, adult: Secondary | ICD-10-CM | POA: Diagnosis not present

## 2024-03-15 DIAGNOSIS — Z8249 Family history of ischemic heart disease and other diseases of the circulatory system: Secondary | ICD-10-CM

## 2024-03-15 DIAGNOSIS — G4739 Other sleep apnea: Secondary | ICD-10-CM | POA: Diagnosis present

## 2024-03-15 DIAGNOSIS — Z885 Allergy status to narcotic agent status: Secondary | ICD-10-CM | POA: Diagnosis not present

## 2024-03-15 DIAGNOSIS — G4733 Obstructive sleep apnea (adult) (pediatric): Secondary | ICD-10-CM | POA: Diagnosis not present

## 2024-03-15 DIAGNOSIS — G40209 Localization-related (focal) (partial) symptomatic epilepsy and epileptic syndromes with complex partial seizures, not intractable, without status epilepticus: Secondary | ICD-10-CM | POA: Diagnosis present

## 2024-03-15 HISTORY — PX: INTRAOPERATIVE TRANSTHORACIC ECHOCARDIOGRAM: SHX6523

## 2024-03-15 LAB — ECHOCARDIOGRAM LIMITED
AR max vel: 1.04 cm2
AV Area VTI: 0.96 cm2
AV Area mean vel: 1.04 cm2
AV Mean grad: 16 mmHg
AV Peak grad: 23.3 mmHg
Ao pk vel: 2.42 m/s

## 2024-03-15 LAB — POCT I-STAT, CHEM 8
BUN: 20 mg/dL (ref 8–23)
Calcium, Ion: 1.2 mmol/L (ref 1.15–1.40)
Chloride: 108 mmol/L (ref 98–111)
Creatinine, Ser: 0.9 mg/dL (ref 0.61–1.24)
Glucose, Bld: 148 mg/dL — ABNORMAL HIGH (ref 70–99)
HCT: 37 % — ABNORMAL LOW (ref 39.0–52.0)
Hemoglobin: 12.6 g/dL — ABNORMAL LOW (ref 13.0–17.0)
Potassium: 3.8 mmol/L (ref 3.5–5.1)
Sodium: 141 mmol/L (ref 135–145)
TCO2: 20 mmol/L — ABNORMAL LOW (ref 22–32)

## 2024-03-15 LAB — GLUCOSE, CAPILLARY: Glucose-Capillary: 137 mg/dL — ABNORMAL HIGH (ref 70–99)

## 2024-03-15 LAB — POCT ACTIVATED CLOTTING TIME: Activated Clotting Time: 327 s

## 2024-03-15 SURGERY — TRANSCATHETER AORTIC VALVE REPLACEMENT, TRANSFEMORAL (CATHLAB)
Anesthesia: Monitor Anesthesia Care

## 2024-03-15 MED ORDER — NOREPINEPHRINE 4 MG/250ML-% IV SOLN
0.0000 ug/min | INTRAVENOUS | Status: DC
  Filled 2024-03-15: qty 250

## 2024-03-15 MED ORDER — HEPARIN (PORCINE) IN NACL 1000-0.9 UT/500ML-% IV SOLN
INTRAVENOUS | Status: DC | PRN
  Administered 2024-03-15: 500 mL

## 2024-03-15 MED ORDER — CEFAZOLIN SODIUM-DEXTROSE 2-4 GM/100ML-% IV SOLN
2.0000 g | Freq: Three times a day (TID) | INTRAVENOUS | Status: AC
  Administered 2024-03-15 – 2024-03-16 (×2): 2 g via INTRAVENOUS
  Filled 2024-03-15 (×2): qty 100

## 2024-03-15 MED ORDER — CHLORHEXIDINE GLUCONATE 4 % EX SOLN: 60.0000 mL | Freq: Once | CUTANEOUS | Status: DC

## 2024-03-15 MED ORDER — MORPHINE SULFATE (PF) 2 MG/ML IV SOLN: 1.0000 mg | INTRAVENOUS | Status: DC | PRN

## 2024-03-15 MED ORDER — CHLORHEXIDINE GLUCONATE 4 % EX SOLN
60.0000 mL | Freq: Once | CUTANEOUS | Status: DC
  Filled 2024-03-15: qty 60

## 2024-03-15 MED ORDER — HEPARIN SODIUM (PORCINE) 1000 UNIT/ML IJ SOLN
INTRAMUSCULAR | Status: DC | PRN
  Administered 2024-03-15: 20500 [IU] via INTRAVENOUS

## 2024-03-15 MED ORDER — PHENYLEPHRINE 80 MCG/ML (10ML) SYRINGE FOR IV PUSH (FOR BLOOD PRESSURE SUPPORT)
PREFILLED_SYRINGE | INTRAVENOUS | Status: DC | PRN
  Administered 2024-03-15: 160 ug via INTRAVENOUS

## 2024-03-15 MED ORDER — SODIUM CHLORIDE 0.9 % IV SOLN: 250.0000 mL | INTRAVENOUS | Status: DC | PRN

## 2024-03-15 MED ORDER — TAMSULOSIN HCL 0.4 MG PO CAPS
0.4000 mg | ORAL_CAPSULE | Freq: Every day | ORAL | Status: DC
  Administered 2024-03-15 – 2024-03-16 (×2): 0.4 mg via ORAL
  Filled 2024-03-15 (×2): qty 1

## 2024-03-15 MED ORDER — LIDOCAINE-EPINEPHRINE 1 %-1:100000 IJ SOLN
INTRAMUSCULAR | Status: DC | PRN
  Administered 2024-03-15: 5 mL via INTRADERMAL

## 2024-03-15 MED ORDER — ZONISAMIDE 100 MG PO CAPS
100.0000 mg | ORAL_CAPSULE | Freq: Every day | ORAL | Status: DC
  Administered 2024-03-15 – 2024-03-16 (×2): 100 mg via ORAL
  Filled 2024-03-15 (×2): qty 1

## 2024-03-15 MED ORDER — LIDOCAINE HCL (PF) 1 % IJ SOLN
INTRAMUSCULAR | Status: DC | PRN
  Administered 2024-03-15: 10 mL via INTRADERMAL
  Administered 2024-03-15: 2 mL via INTRADERMAL

## 2024-03-15 MED ORDER — ALPRAZOLAM 0.5 MG PO TABS: 0.5000 mg | ORAL_TABLET | Freq: Every evening | ORAL | Status: DC | PRN

## 2024-03-15 MED ORDER — HEPARIN (PORCINE) IN NACL 2000-0.9 UNIT/L-% IV SOLN
INTRAVENOUS | Status: DC | PRN
  Administered 2024-03-15: 1000 mL

## 2024-03-15 MED ORDER — SODIUM CHLORIDE 0.9% FLUSH: 3.0000 mL | INTRAVENOUS | Status: DC | PRN

## 2024-03-15 MED ORDER — DULOXETINE HCL 60 MG PO CPEP
60.0000 mg | ORAL_CAPSULE | Freq: Every day | ORAL | Status: DC
  Administered 2024-03-15 – 2024-03-16 (×2): 60 mg via ORAL
  Filled 2024-03-15 (×2): qty 1

## 2024-03-15 MED ORDER — TRAMADOL HCL 50 MG PO TABS
50.0000 mg | ORAL_TABLET | ORAL | Status: DC | PRN
  Administered 2024-03-15: 50 mg via ORAL
  Filled 2024-03-15 (×2): qty 1

## 2024-03-15 MED ORDER — ATORVASTATIN CALCIUM 80 MG PO TABS
80.0000 mg | ORAL_TABLET | Freq: Every day | ORAL | Status: DC
  Administered 2024-03-15 – 2024-03-16 (×2): 80 mg via ORAL
  Filled 2024-03-15 (×2): qty 1

## 2024-03-15 MED ORDER — VERAPAMIL HCL 2.5 MG/ML IV SOLN
INTRAVENOUS | Status: AC
  Filled 2024-03-15: qty 2

## 2024-03-15 MED ORDER — PANTOPRAZOLE SODIUM 40 MG PO TBEC
40.0000 mg | DELAYED_RELEASE_TABLET | Freq: Every day | ORAL | Status: DC
  Administered 2024-03-15 – 2024-03-16 (×2): 40 mg via ORAL
  Filled 2024-03-15 (×2): qty 1

## 2024-03-15 MED ORDER — SODIUM CHLORIDE 0.9% FLUSH
3.0000 mL | Freq: Two times a day (BID) | INTRAVENOUS | Status: DC
  Administered 2024-03-15 – 2024-03-16 (×2): 3 mL via INTRAVENOUS

## 2024-03-15 MED ORDER — SODIUM CHLORIDE 0.9 % IV SOLN: INTRAVENOUS | Status: DC

## 2024-03-15 MED ORDER — PROPOFOL 500 MG/50ML IV EMUL
INTRAVENOUS | Status: DC | PRN
  Administered 2024-03-15: 25 ug/kg/min via INTRAVENOUS

## 2024-03-15 MED ORDER — SODIUM CHLORIDE 0.9 % IV SOLN: INTRAVENOUS | Status: AC

## 2024-03-15 MED ORDER — LIDOCAINE HCL (PF) 1 % IJ SOLN
INTRAMUSCULAR | Status: AC
  Filled 2024-03-15: qty 30

## 2024-03-15 MED ORDER — ACETAMINOPHEN 500 MG PO TABS
1000.0000 mg | ORAL_TABLET | Freq: Once | ORAL | Status: AC
  Administered 2024-03-15: 1000 mg via ORAL
  Filled 2024-03-15: qty 2

## 2024-03-15 MED ORDER — IODIXANOL 320 MG/ML IV SOLN
INTRAVENOUS | Status: DC | PRN
  Administered 2024-03-15: 25 mL

## 2024-03-15 MED ORDER — CHLORHEXIDINE GLUCONATE 0.12 % MT SOLN
15.0000 mL | Freq: Once | OROMUCOSAL | Status: AC
  Administered 2024-03-15: 15 mL via OROMUCOSAL
  Filled 2024-03-15: qty 15

## 2024-03-15 MED ORDER — ONDANSETRON HCL 4 MG/2ML IJ SOLN: 4.0000 mg | Freq: Four times a day (QID) | INTRAMUSCULAR | Status: DC | PRN

## 2024-03-15 MED ORDER — VERAPAMIL HCL 2.5 MG/ML IV SOLN
INTRAVENOUS | Status: DC | PRN
  Administered 2024-03-15: 10 mL via INTRA_ARTERIAL

## 2024-03-15 MED ORDER — LACTATED RINGERS IV SOLN: INTRAVENOUS | Status: DC

## 2024-03-15 MED ORDER — CHLORHEXIDINE GLUCONATE 4 % EX SOLN: 30.0000 mL | CUTANEOUS | Status: DC

## 2024-03-15 MED ORDER — PROTAMINE SULFATE 10 MG/ML IV SOLN
INTRAVENOUS | Status: DC | PRN
  Administered 2024-03-15: 40 mg via INTRAVENOUS
  Administered 2024-03-15: 50 mg via INTRAVENOUS
  Administered 2024-03-15: 10 mg via INTRAVENOUS

## 2024-03-15 MED ORDER — NITROGLYCERIN IN D5W 200-5 MCG/ML-% IV SOLN: 0.0000 ug/min | INTRAVENOUS | Status: DC

## 2024-03-15 SURGICAL SUPPLY — 32 items
BAG SNAP BAND KOVER 36X36 (MISCELLANEOUS) ×4 IMPLANT
CABLE ADAPT PACING TEMP 12FT (ADAPTER) IMPLANT
CATH COMMANDER DELIVERY SYS 29 (CATHETERS) IMPLANT
CATH DIAG 6FR PIGTAIL ANGLED (CATHETERS) IMPLANT
CATH INFINITI 5FR ANG PIGTAIL (CATHETERS) IMPLANT
CATH INFINITI 6F AL2 (CATHETERS) IMPLANT
CATH S G BIP PACING (CATHETERS) IMPLANT
CLOSURE PERCLOSE PROSTYLE (Vascular Products) IMPLANT
CRIMPER (MISCELLANEOUS) IMPLANT
DEVICE INFLATION ATRION QL38 (MISCELLANEOUS) IMPLANT
DEVICE RAD COMP TR BAND LRG (VASCULAR PRODUCTS) IMPLANT
ELECT DEFIB PAD ADLT CADENCE (PAD) IMPLANT
GLIDESHEATH SLEND SS 6F .021 (SHEATH) IMPLANT
KIT MICROPUNCTURE NIT STIFF (SHEATH) IMPLANT
KIT SAPIAN 3 ULTRA RESILIA 29 (Valve) IMPLANT
KIT SINGLE USE MANIFOLD (KITS) IMPLANT
PACK CARDIAC CATHETERIZATION (CUSTOM PROCEDURE TRAY) ×2 IMPLANT
PAD SORBX EP SHIELD 16.5X12 (MISCELLANEOUS) IMPLANT
SET ATX-X65L (MISCELLANEOUS) IMPLANT
SHEATH BRITE TIP 7FR 35CM (SHEATH) IMPLANT
SHEATH INTRODUCER SET 29 (SHEATH) IMPLANT
SHEATH PINNACLE 8F 10CM (SHEATH) IMPLANT
SHIELD CATH-GARD CONTAMINATION (MISCELLANEOUS) IMPLANT
STOPCOCK MORSE 400PSI 3WAY (MISCELLANEOUS) ×4 IMPLANT
TRANSDUCER W/STOPCOCK (MISCELLANEOUS) IMPLANT
TUBING ART PRESS 72 MALE/FEM (TUBING) IMPLANT
WIRE AMPLATZ SS-J .035X180CM (WIRE) IMPLANT
WIRE EMERALD 3MM-J .035X150CM (WIRE) IMPLANT
WIRE EMERALD 3MM-J .035X260CM (WIRE) IMPLANT
WIRE EMERALD ST .035X260CM (WIRE) IMPLANT
WIRE HI TORQ VERSACORE 300 (WIRE) IMPLANT
WIRE SAFARI SM CURVE 275 (WIRE) IMPLANT

## 2024-03-15 NOTE — Discharge Instructions (Signed)

## 2024-03-15 NOTE — Interval H&P Note (Signed)
 History and Physical Interval Note:  03/15/2024 9:37 AM  Steven [name]  has presented today for surgery, with the diagnosis of Severe Aortic Stenosis.  The various methods of treatment have been discussed with the patient and family. After consideration of risks, benefits and other options for treatment, the patient has consented to  Procedure(s): Transcatheter Aortic Valve Replacement, Transfemoral (N/A) ECHOCARDIOGRAM, TRANSTHORACIC (N/A) as a surgical intervention.  The patient's history has been reviewed, patient examined, no change in status, stable for surgery.  I have reviewed the patient's chart and labs.  Questions were answered to the patient's satisfaction.     Con [name]

## 2024-03-15 NOTE — Anesthesia Preprocedure Evaluation (Addendum)
 Anesthesia Evaluation  Patient identified by MRN, date of birth, ID band Patient awake    Reviewed: Allergy & Precautions, NPO status , Patient's Chart, lab work & pertinent test results  History of Anesthesia Complications Negative for: history of anesthetic complications  Airway Mallampati: I  TM Distance: >3 FB Neck ROM: Full    Dental  (+) Dental Advisory Given   Pulmonary sleep apnea (no longer uses CPAP) , former smoker   breath sounds clear to auscultation       Cardiovascular hypertension, Pt. on medications (-) angina + CAD  + dysrhythmias Atrial Fibrillation + Valvular Problems/Murmurs AS  Rhythm:Regular Rate:Normal + Systolic murmurs 89/7974 Cath:   Prox RCA to Mid RCA lesion is 30% stenosed.   Dist RCA lesion is 50% stenosed with 50% stenosed side branch in RPDA.   RPAV lesion is 70% stenosed.   Mid LM to Dist LM lesion is 40% stenosed.   Prox LAD to Mid LAD lesion is 60% stenosed.  12/2023 ECHO: EF 70 to 75%.  1.The LV has hyperdynamic function, no regional wall motion abnormalities. There is moderate asymmetric lLVH of the basal and septal segments.  2. RVF is normal. The right ventricular size is normal. Tricuspid regurgitation signal is inadequate for assessing PA pressure.   3. Left atrial size was severely dilated.   4. Right atrial size was severely dilated.   5. The mitral valve is abnormal. Mild mitral valve regurgitation. No evidence of mitral stenosis. Severe mitral annular calcification.   6. The aortic valve is tricuspid. There is severe calcifcation of the aortic valve. There is severe thickening of the aortic valve. Severe restriction of aortic valve leaflets. Aortic valve regurgitation is moderate. Paradoxical low flow-low gradient severe aortic valve stenosis. Stroke volume index is 27.27 (normal in 01/2023). Aortic valve area, by VTI measures 0.86 cm. Aortic valve mean gradient measures 29.0 mmHg.  Aortic valve Vmax measures 3.45 m/s. DVI is 0.30.     Neuro/Psych Seizures -,     GI/Hepatic Neg liver ROS,GERD  Controlled,,  Endo/Other  BMI 43  Renal/GU negative Renal ROS     Musculoskeletal  (+) Arthritis ,    Abdominal   Peds  Hematology Eliquis  Hb 15.7, plt 187k   Anesthesia Other Findings   Reproductive/Obstetrics                              Anesthesia Physical Anesthesia Plan  ASA: 4  Anesthesia Plan: MAC   Post-op Pain Management: Tylenol  PO (pre-op)*   Induction:   PONV Risk Score and Plan: 1 and Treatment may vary due to age or medical condition  Airway Management Planned: Natural Airway and Simple Face Mask  Additional Equipment: None  Intra-op Plan:   Post-operative Plan:   Informed Consent: I have reviewed the patients History and Physical, chart, labs and discussed the procedure including the risks, benefits and alternatives for the proposed anesthesia with the patient or authorized representative who has indicated his/her understanding and acceptance.     Dental advisory given  Plan Discussed with: CRNA and Surgeon  Anesthesia Plan Comments:          Anesthesia Quick Evaluation

## 2024-03-15 NOTE — Progress Notes (Signed)
  [facility]   MULTIDISCIPLINARY HEART VALVE TEAM  Patient doing well s/p TAVR. He is hemodynamically stable but BP is soft. Groin sites stable. ECG with afib with slow VR, new LBBB but no high grade block. HR in 40-50s. I think HR and BP will improve as he wakes up more. Plan to transfer to from cath lab holding to 4E when bed available. Early ambulation after bedrest completed and hopeful discharge over the next 24-48 hours.   Lamarr Hummer PA-C  [facility]  Pager [phone]

## 2024-03-15 NOTE — Anesthesia Postprocedure Evaluation (Signed)
 Anesthesia Post Note  Patient: Steven [name]  Procedure(s) Performed: Transcatheter Aortic Valve Replacement, Transfemoral ECHOCARDIOGRAM, TRANSTHORACIC     Patient location during evaluation: Endoscopy Anesthesia Type: MAC Level of consciousness: awake and alert, oriented and patient cooperative Pain management: pain level controlled Vital Signs Assessment: post-procedure vital signs reviewed and stable Respiratory status: spontaneous breathing, nonlabored ventilation, respiratory function stable and patient connected to nasal cannula oxygen Cardiovascular status: bradycardic and stable Postop Assessment: no apparent nausea or vomiting Anesthetic complications: no Comments: Bradycardia improved to 50's with Ephedrine , BP stable. Drs Daniel and Wonda aware of post-TAVR brady, and will follow    There were no known notable events for this encounter.  Last Vitals:  Vitals:   03/15/24 1505 03/15/24 1524  BP: 109/73 103/73  Pulse: (!) 54 (!) 54  Resp: 12 16  Temp:  36.4 C  SpO2: 94% 97%    Last Pain:  Vitals:   03/15/24 1524  TempSrc: Oral  PainSc: 0-No pain                 [name],E. [name]

## 2024-03-15 NOTE — Transfer of Care (Signed)
 Immediate Anesthesia Transfer of Care Note  Patient: Steven [name]  Procedure(s) Performed: Transcatheter Aortic Valve Replacement, Transfemoral ECHOCARDIOGRAM, TRANSTHORACIC  Patient Location: Cath Lab  Anesthesia Type:MAC  Level of Consciousness: drowsy and patient cooperative  Airway & Oxygen Therapy: Patient Spontanous Breathing and Patient connected to face mask oxygen  Post-op Assessment: Report given to RN, Post -op Vital signs reviewed and stable, Patient moving all extremities, and Patient moving all extremities X 4  Post vital signs: Reviewed and stable  Last Vitals:  Vitals Value Taken Time  BP 84/56 03/15/24 13:30  Temp    Pulse 53 03/15/24 13:31  Resp 12 03/15/24 13:31  SpO2 97 % 03/15/24 13:31  Vitals shown include unfiled device data.  Last Pain:  Vitals:   03/15/24 0923  TempSrc:   PainSc: 0-No pain         Complications: There were no known notable events for this encounter.

## 2024-03-15 NOTE — Discharge Summary (Incomplete)
 [facility]   MULTIDISCIPLINARY HEART VALVE TEAM  Discharge Summary    Patient ID: Steven [name] MRN: [id number]; DOB: 1940/05/24  Admit date: 03/15/2024 Discharge date: 03/16/2024  PCP:  Alphonsa Glendia LABOR, MD  [facility] Cardiologist:  [name], MD  [facility] Structural heart: [name], MD [facility] Electrophysiologist:  None   Discharge Diagnoses    Principal Problem:   S/P TAVR (transcatheter aortic valve replacement) Active Problems:   CORONARY ATHEROSCLEROSIS NATIVE CORONARY ARTERY   HTN (hypertension)   Hypercholesteremia   Anemia   Morbid obesity (HCC)   Obstructive sleep apnea   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)   Allergies Allergies  Allergen Reactions   Ciprofloxacin Rash   Oxycodone-Acetaminophen  Rash and Other (See Comments)    Felt like he was hot   Oxycodone-Acetaminophen  Rash    Diagnostic Studies/Procedures    TAVR OPERATIVE NOTE     Date of Procedure:                03/15/2024   Preoperative Diagnosis:      Severe Aortic Stenosis    Postoperative Diagnosis:    Same    Procedure:        Transcatheter Aortic Valve Replacement - Percutaneous Transfemoral Approach             Edwards Sapien 3 Ultra Resilia (size 29 mm, model # [id number] , serial # [id number])              Co-Surgeons:                        Con Clunes, MD and [name], MD      Anesthesiologist:                  Leonce, MD   Echocardiographer:              Delford, MD   Pre-operative Echo Findings: Severe aortic stenosis Normal left ventricular systolic function   Post-operative Echo Findings: No paravalvular leak Normal left ventricular systolic function _____________    Echo 03/16/24: IMPRESSIONS   1. Left ventricular ejection fraction, by estimation, is 65 to 70%. Left  ventricular ejection fraction by 2D MOD biplane is 73.2 %. The left  ventricle has normal function. The left ventricle has no regional wall   motion abnormalities. Left ventricular  diastolic function could not be evaluated.   2. Right ventricular systolic function is normal. The right ventricular  size is normal.   3. Left atrial size was severely dilated.   4. MVA 1.91 cm^2 by 2D Planimetry. The mitral valve is degenerative. No  evidence of mitral valve regurgitation. Mild mitral stenosis. Moderate to  severe mitral annular calcification.   5. Well seated with normal leaflet opening and normal function (PV 1.9  m/sec, MG 8.2 mmHg, and DVI 0.67) with no evidence of transvalvular or  paravalvular regurgitation. . The aortic valve has been repaired/replaced.  Aortic valve regurgitation is not  visualized. No aortic stenosis is present. There is a 29 mm Edwards Sapien  prosthetic (TAVR) valve present in the aortic position. Procedure Date:  03/15/2024.   6. There is moderate dilatation of the ascending aorta, measuring 45 mm.   7. The inferior vena cava is normal in size with greater than 50%  respiratory variability, suggesting right atrial pressure of 3 mmHg.   Comparison(s): Changes from prior study are noted. S/p TAVR.    History  of Present Illness     Steven [name] is a 83 y.o. male with a history of CAD, TAA, HTN, HLD, pre diabetes, morbid obesity (BMI 43), permanent atrial fib on Eliquis  and severe paradoxical low-flow low gradient AS who presented to [facility] on [facility]/15[facility]24 for planned TAVR.   He reported dizziness, fatigue and dyspnea. Echo 12/21/23 showed EF 70%, severe MAC with mild MR and pLFLG AS w/ mean grad 29 mmHg, Vmax 3.44 m/s, AVA 0.86 cm2, DVI 0.28, SVI 27. Cath 01/18/24 showed mild distal left main stenosis of 30 to 40%, moderate mid LAD stenosis of 60 to 70%, patent, small left circumflex with patent intermediate branch, large, dominant RCA with mild nonobstructive plaquing and moderate distal bifurcation stenosis involving the distal RCA, PDA, and PLA branches.   The patient was evaluated by the multidisciplinary  valve team and felt to have severe, symptomatic aortic stenosis and to be a suitable candidate for TAVR, which was set up for 03/15/24.  Hospital Course     Consultants: none   Severe AS:  -- S/p TAVR with a 29 mm Edwards Sapien 3 Ultra Resilia THV via the TF approach on 03/15/24.  -- Post operative echo showed EF 65%, mod-severe MAC with mild MS, normally functioning TAVR with a mean gradient of 8.2 mmHg and no PVL. -- Groin sites are stable.  -- ECG with afib and no high grade heart block. -- Continue Eliquis  5mg  BID. -- Met with cardiac rehab to discuss CRP phase II.  -- Plan for discharge home today with close follow up in the outpatient setting.   HTN: -- BP elevated in the setting of holding home meds.  -- Resume home lisinopril  2.5mg  daily  TAA: -- 4.5 cm on pre TAVR CT.  -- Continue to follow over time.   Permanent atrial fibrillation: -- Rate well controlled on no rate controlling meds.  -- Resume home Eliquis  5mg  BID.    _____________  Discharge Vitals Blood pressure (!) 146/88, pulse 92, temperature 98.3 F (36.8 C), temperature source Oral, resp. rate 17, height 5' 10 (1.778 m), weight 135.8 kg, SpO2 96%.  Filed Weights   03/15/24 0855 03/16/24 0446  Weight: (!) 136.1 kg 135.8 kg     GEN: Well nourished, well developed in no acute distress, obese. NECK: No JVD CARDIAC: irreg irreg, no murmurs, rubs, gallops RESPIRATORY:  Clear to auscultation without rales, wheezing or rhonchi  ABDOMEN: Soft, non-tender, non-distended EXTREMITIES:  No edema; No deformity.  Groin/radial sites clear without hematoma or ecchymosis.    Disposition   Pt is being discharged home today in good condition.  Follow-up Plans & Appointments     Follow-up Information     Wonda Sharper, MD. Go on 03/24/2024.   Specialty: Cardiology Why: @ 11:20am, please arrive at least 20 minutes early. Contact information: [address] [place] [address] [phone]                 Discharge Instructions     Amb Referral to Cardiac Rehabilitation   Complete by: As directed    Diagnosis: Valve Replacement   Valve: Aortic Comment - tavr   After initial evaluation and assessments completed: Virtual Based Care may be provided alone or in conjunction with Phase 2 Cardiac Rehab based on patient barriers.: Yes   Intensive Cardiac Rehabilitation (ICR) [facility] location only OR Traditional Cardiac Rehabilitation (TCR) *If criteria for ICR are not met will enroll in TCR ([facility] only): Yes  Discharge Medications   Allergies as of 03/16/2024       Reactions   Ciprofloxacin Rash   Oxycodone-acetaminophen  Rash, Other (See Comments)   Felt like he was hot   Oxycodone-acetaminophen  Rash        Medication List     TAKE these medications    Align 10 MG Caps Take 10 mg by mouth every morning.   ALPRAZolam  0.5 MG tablet Commonly known as: Xanax  Take 1 tablet (0.5 mg total) by mouth at bedtime as needed for sleep (only for use during sleep study, patient needs designated driver in AM).   atorvastatin  80 MG tablet Commonly known as: LIPITOR Take 1 tablet (80 mg total) by mouth daily.   cyanocobalamin 1000 MCG tablet Commonly known as: VITAMIN B12 Take by mouth daily.   DULoxetine  60 MG capsule Commonly known as: CYMBALTA  Take 1 capsule (60 mg total) by mouth daily.   Eliquis  5 MG Tabs tablet Generic drug: apixaban  TAKE (1) TABLET BY MOUTH TWICE DAILY.   Fish Oil 1000 MG Caps Take 1,000 mg by mouth daily.   lisinopril  2.5 MG tablet Commonly known as: ZESTRIL  Take 1 tablet (2.5 mg total) by mouth daily.   pantoprazole  40 MG tablet Commonly known as: PROTONIX  Take 1 tablet (40 mg total) by mouth daily.   Systane Ultra 0.4-0.3 % Soln Generic drug: Polyethyl Glycol-Propyl Glycol Place 1 drop into both eyes daily as needed (Dry eyes).   tamsulosin  0.4 MG Caps capsule Commonly known as: FLOMAX  Take 1 capsule (0.4 mg total) by mouth daily.    vitamin C 1000 MG tablet Take 1,000 mg by mouth daily.   Vitamin D 50 MCG (2000 UT) Caps Take 2,000 Units by mouth daily.   Voltaren 1 % Gel Generic drug: diclofenac Sodium Apply 2 g topically daily as needed (Knee pain).   zinc gluconate 50 MG tablet Take 50 mg by mouth daily.   zonisamide  100 MG capsule Commonly known as: ZONEGRAN  TAKE (2) CAPSULES BY MOUTH ONCE DAILY.          Outstanding Labs/Studies   none ______________________  Duration of Discharge Encounter: APP Time: 15 minutes    Signed, Lamarr Hummer, PA-C 03/16/2024, 11:24 AM [phone]  Patient seen, examined. Available data reviewed. Agree with findings, assessment, and plan as outlined by Izetta Hummer, PA-C.  The patient is independently interviewed and examined.  He is alert, oriented, in no distress.  HEENT is normal, JVP is normal, lungs are clear bilaterally, heart is irregularly irregular with a 2/6 early ejection murmur at the right upper sternal border, bilateral groin sites are clear with no hematoma or ecchymoses, lower extremities have no significant edema.  Telemetry shows atrial fibrillation with a heart rate currently in the 80s.  Yesterday following TAVR, he had slow atrial fibrillation and left bundle branch block.  This improved over the course of the day and this morning he is back to his baseline QRS and baseline heart rate with permanent atrial fibrillation.  I do not think he requires any outpatient monitoring at this point.  He is given instructions about post TAVR care.  His echocardiogram is reviewed and shows normal TAVR prosthetic function.  The patient is medically stable for discharge today.  MD time spent conducting this discharge is 25 minutes and includes my personal exam of the patient, counseling the patient and his wife on discharge instructions, review of his echo and lab data, and review of his telemetry.  [name], M.D. 03/16/2024 5:22 PM

## 2024-03-15 NOTE — Op Note (Signed)
 [facility]   MULTIDISCIPLINARY HEART VALVE TEAM   TAVR OPERATIVE NOTE   Date of Procedure:  03/15/2024  Preoperative Diagnosis: Severe Aortic Stenosis   Postoperative Diagnosis: Same   Procedure:   Transcatheter Aortic Valve Replacement - Percutaneous Transfemoral Approach  Edwards Sapien 3 Ultra Resilia (size 29 mm, model # [id number] , serial # [id number])   Co-Surgeons:  Con Clunes, MD and [name], MD    Anesthesiologist:  Leonce, MD  Echocardiographer:  Delford, MD  Pre-operative Echo Findings: Severe aortic stenosis Normal left ventricular systolic function  Post-operative Echo Findings: No paravalvular leak Normal left ventricular systolic function   BRIEF CLINICAL NOTE AND INDICATIONS FOR SURGERY  Steven [name] is a 83 y.o. male presents for evaluation of severe aortic stenosis and TAVR.  His main complaints are dizziness, fatigue and dyspnea.  He notes that even in the reception area, he took a step backwards and felt like he might fall.  He is very functional and independent in his ADLs, although he does admit he has not been exercising at all and has gotten quite lazy.  He has a history of CAD, TAA, HTN, HL and Afib on Eliquis .  His ECHO recently demonstrated EF 70%, severe MAC with pLFLG AS w/ MG 29 mmHg, Vmax 3.44, AVA 0.86.   DETAILS OF THE OPERATIVE PROCEDURE  PREPARATION:    The patient was brought to the operating room on the above mentioned date and appropriate monitoring was established by the anesthesia team. The patient was placed in the supine position on the operating table.  Intravenous antibiotics were administered. The patient was monitored closely throughout the procedure under conscious sedation.  Baseline transthoracic echocardiogram was performed. The patient's abdomen and both groins were prepped and draped in a sterile manner. A time out procedure was performed.   PERIPHERAL ACCESS:    Using the modified Seldinger  technique, femoral arterial access was obtained with placement of a 6 Fr sheath in the right radial artery.  A pigtail diagnostic catheter was passed through the radial arterial sheath under fluoroscopic guidance into the aortic root.  A temporary transvenous pacemaker catheter was passed through the right femoral venous sheath under fluoroscopic guidance into the right ventricle.  The pacemaker was tested to ensure stable lead placement and pacemaker capture. Aortic root angiography was performed in order to determine the optimal angiographic angle for valve deployment.   TRANSFEMORAL ACCESS:   Percutaneous transfemoral access and sheath placement was performed using ultrasound guidance.  The right common femoral artery was cannulated using a micropuncture needle and appropriate location was verified using hand injection angiogram.  A pair of Abbott Perclose percutaneous closure devices were placed and a 6 French sheath replaced into the femoral artery.  The patient was heparinized systemically and ACT verified > 250 seconds.    A 14 Fr transfemoral E-sheath was introduced into the right common femoral artery after progressively dilating over an Amplatz superstiff wire. An AL-2 catheter was used to direct a straight-tip exchange length wire across the native aortic valve into the left ventricle. This was exchanged out for a pigtail catheter and position was confirmed in the LV apex. Simultaneous LV and Ao pressures were recorded.  The LVEDP was 22 mmHg.  The pigtail catheter was exchanged for a Safari wire in the LV apex.   TRANSCATHETER HEART VALVE DEPLOYMENT:   An Edwards Sapien 3 Ultra transcatheter heart valve (29 mm) was prepared and crimped per manufacturer's guidelines, and the proper orientation  of the valve was confirmed on the [employer] delivery system. The valve was advanced through the introducer sheath using normal technique until in an appropriate position in the abdominal aorta  beyond the sheath tip. The balloon was then retracted and using the fine-tuning wheel was centered on the valve. The valve was then advanced across the aortic arch using appropriate flexion of the catheter. The valve was carefully positioned across the aortic valve annulus. The Commander catheter was retracted using normal technique. Once final position of the valve was confirmed by angiographic assessment, the valve was deployed during rapid ventricular pacing to maintain systolic blood pressure < 50 mmHg and pulse pressure < 10 mmHg. The balloon inflation was held for >3 seconds after reaching full deployment volume. Once the balloon was fully deflated the balloon was retracted into the ascending aorta and valve function was assessed using echocardiography. There was felt to be no paravalvular leak and no central aortic insufficiency.  The patient's hemodynamic recovery following valve deployment was good.  The deployment balloon and guidewire were both removed.    PROCEDURE COMPLETION:   The sheath was removed and femoral artery closure performed.  Protamine was administered once femoral arterial repair was complete. The temporary pacemaker, pigtail catheter and femoral sheaths were removed with manual pressure used for venous hemostasis.  A TR band was placed over the right radial artery following removal of the diagnostic sheath.  The patient tolerated the procedure well and was transported to the cath lab recovery area in stable condition. There were no immediate intraoperative complications. All sponge instrument and needle counts were verified correct at completion of the operation.   No blood products were administered during the operation.  The patient received a total of 25 mL of intravenous contrast during the procedure.   Con GORMAN Clunes, MD 03/15/2024 1:58 PM

## 2024-03-15 NOTE — Op Note (Signed)
 [facility]   MULTIDISCIPLINARY HEART VALVE TEAM   TAVR OPERATIVE NOTE   Date of Procedure:  03/15/2024  Preoperative Diagnosis: Severe Aortic Stenosis   Postoperative Diagnosis: Same   Procedure:   Transcatheter Aortic Valve Replacement - Percutaneous Transfemoral Approach  Edwards Sapien 3 Ultra Resilia THV (size 29 mm, serial # [id number])   Co-Surgeons:  Con Clunes, MD and [name], MD  Anesthesiologist:  FORBES Kelly Mace, MD  Echocardiographer:  Maude Emmer, MD  Pre-operative Echo Findings: Severe aortic stenosis Normal left ventricular systolic function  Post-operative Echo Findings: No paravalvular leak Normal/unchanged left ventricular systolic function  BRIEF CLINICAL NOTE AND INDICATIONS FOR SURGERY  Mr. Steven [name] is well-known to me is a longtime clinic patient.  He has developed severe symptomatic aortic stenosis with background medical problems of permanent atrial fibrillation, morbid obesity, coronary artery disease, and hypertension.  After review of all of his preoperative testing, he is felt to be a suitable candidate for transfemoral TAVR.  During the course of the patient's preoperative work up they have been evaluated comprehensively by a multidisciplinary team of specialists coordinated through the Multidisciplinary [facility] Valve Clinic in the [facility].  They have been demonstrated to suffer from symptomatic severe aortic stenosis as noted above. The patient has been counseled extensively as to the relative risks and benefits of all options for the treatment of severe aortic stenosis including long term medical therapy, conventional surgery for aortic valve replacement, and transcatheter aortic valve replacement.  The patient has been independently evaluated in formal cardiac surgical consultation by Dr Clunes, who deemed the patient appropriate for TAVR. Based upon review of all of the patient's preoperative diagnostic  tests they are felt to be candidate for transcatheter aortic valve replacement using the transfemoral approach as an alternative to conventional surgery.    Following the decision to proceed with transcatheter aortic valve replacement, a discussion has been held regarding what types of management strategies would be attempted intraoperatively in the event of life-threatening complications, including whether or not the patient would be considered a candidate for the use of cardiopulmonary bypass and/or conversion to open sternotomy for attempted surgical intervention.  The patient has been advised of a variety of complications that might develop peculiar to this approach including but not limited to risks of death, stroke, paravalvular leak, aortic dissection or other major vascular complications, aortic annulus rupture, device embolization, cardiac rupture or perforation, acute myocardial infarction, arrhythmia, heart block or bradycardia requiring permanent pacemaker placement, congestive heart failure, respiratory failure, renal failure, pneumonia, infection, other late complications related to structural valve deterioration or migration, or other complications that might ultimately cause a temporary or permanent loss of functional independence or other long term morbidity.  The patient provides full informed consent for the procedure as described and all questions were answered preoperatively.  DETAILS OF THE OPERATIVE PROCEDURE  PREPARATION:   The patient is brought to the operating room on the above mentioned date and central monitoring was established by the anesthesia team. The patient is placed in the supine position on the operating table.  Intravenous antibiotics are administered. The patient is monitored closely throughout the procedure under conscious sedation.  Baseline transthoracic echocardiogram is performed. The patient's chest, abdomen, both groins, and both lower extremities are prepared  and draped in a sterile manner. A time out procedure is performed.   PERIPHERAL ACCESS:   Using ultrasound guidance, 5/6 French slender sheath is inserted into the right radial artery.  Verapamil  was administered.  A pigtail catheter was advanced from the radial sheath into the central aorta.  US  images are digitally captured and stored in the patient's chart.  Femoral venous access is obtained on the right side also using ultrasound guidance and a long 7 French sheath is inserted.  A temporary transvenous pacemaker catheter was passed through the femoral venous sheath under fluoroscopic guidance into the right ventricle.  The pacemaker was tested to ensure stable lead placement and pacemaker capture. Aortic root angiography was performed in order to determine the optimal angiographic angle for valve deployment.  TRANSFEMORAL ACCESS:  A micropuncture technique is used to access the right femoral artery under fluoroscopic and ultrasound guidance.  2 Perclose devices are deployed at 10' and 2' positions to 'PreClose' the femoral artery. An 8 French sheath is placed and then an Amplatz Superstiff wire is advanced through the sheath. This is changed out for a 16 French transfemoral E-Sheath after progressively dilating over the Superstiff wire.  An AL-2 catheter was used to direct a straight-tip exchange length wire across the native aortic valve into the left ventricle. This was exchanged out for a pigtail catheter and position was confirmed in the LV apex. Simultaneous LV and Ao pressures were recorded.  The pigtail catheter was exchanged for a Safari wire in the LV apex.    BALLOON AORTIC VALVULOPLASTY:  Not performed  TRANSCATHETER HEART VALVE DEPLOYMENT:  An Edwards Sapien 3 Ultra Resilia transcatheter heart valve (size 29 mm) was prepared and crimped per manufacturer's guidelines, and the proper orientation of the valve is confirmed on the [employer] delivery system. The valve was advanced  through the introducer sheath using normal technique until in an appropriate position in the abdominal aorta beyond the sheath tip. The balloon was then retracted and using the fine-tuning wheel was centered on the valve. The valve was then advanced across the aortic arch using appropriate flexion of the catheter. The valve was carefully positioned across the aortic valve annulus. The Commander catheter was retracted using normal technique. Once final position of the valve has been confirmed by angiographic assessment, the valve is deployed while temporarily holding ventilation and during rapid ventricular pacing to maintain systolic blood pressure < 50 mmHg and pulse pressure < 10 mmHg. The balloon inflation is held for >3 seconds after reaching full deployment volume. Once the balloon has fully deflated the balloon is retracted into the ascending aorta and valve function is assessed using echocardiography. The patient's hemodynamic recovery following valve deployment is good.  The deployment balloon and guidewire are both removed. Echo demostrated acceptable post-procedural gradients, stable mitral valve function, and no aortic insufficiency.    PROCEDURE COMPLETION:  The sheath was removed and femoral artery closure is performed using the 2 previously deployed Perclose devices.  Protamine is administered once femoral arterial repair was complete. The site is clear with no evidence of bleeding or hematoma after the sutures are tightened. The temporary pacemaker and pigtail catheters are removed. Mynx closure is used for contralateral femoral arterial hemostasis for the 6 Fr sheath.  The patient tolerated the procedure well and is transported to the recovery area in stable condition. There were no immediate intraoperative complications. All sponge instrument and needle counts are verified correct at completion of the operation.   The patient received a total of 55 mL of intravenous contrast during the  procedure.  EBL: minimal  LVEDP: 16 mmHg   [name], MD 03/15/2024 2:03 PM

## 2024-03-15 NOTE — Progress Notes (Signed)
 TR band removed at 1700, transparent dressing applied and educated the pt to keep the dressing for 24 hours, no any bleeding noted.

## 2024-03-15 NOTE — Plan of Care (Signed)

## 2024-03-15 NOTE — Progress Notes (Signed)
 Patient arrived in the unit, he is alert and oriented, CCMD notified, V/S obtained, CHG bath given, vascular sites level 0, all needs met, call bell in reach.   03/15/24 1524  Vitals  Temp 97.6 F (36.4 C)  Temp Source Oral  BP 103/73  MAP (mmHg) 83  BP Location Left Wrist  BP Method Automatic  Patient Position (if appropriate) Lying  Pulse Rate (!) 54  Pulse Rate Source Monitor  Resp 16  Level of Consciousness  Level of Consciousness Alert  MEWS COLOR  MEWS Score Color Green  Oxygen Therapy  SpO2 97 %  O2 Device Room Air  Pain Assessment  Pain Scale 0-10  Pain Score 0  MEWS Score  MEWS Temp 0  MEWS Systolic 0  MEWS Pulse 0  MEWS RR 0  MEWS LOC 0  MEWS Score 0

## 2024-03-15 NOTE — Plan of Care (Signed)
  Problem: Clinical Measurements: Goal: Will remain free from infection Outcome: Progressing   Problem: Nutrition: Goal: Adequate nutrition will be maintained Outcome: Progressing   Problem: Activity: Goal: Risk for activity intolerance will decrease Outcome: Progressing   Problem: Safety: Goal: Ability to remain free from injury will improve Outcome: Progressing

## 2024-03-16 ENCOUNTER — Encounter (HOSPITAL_COMMUNITY): Payer: Self-pay | Admitting: Cardiovascular Disease

## 2024-03-16 ENCOUNTER — Inpatient Hospital Stay (HOSPITAL_COMMUNITY)

## 2024-03-16 ENCOUNTER — Other Ambulatory Visit: Payer: Self-pay

## 2024-03-16 DIAGNOSIS — Z952 Presence of prosthetic heart valve: Secondary | ICD-10-CM

## 2024-03-16 LAB — ECHOCARDIOGRAM COMPLETE
AR max vel: 2.69 cm2
AV Area VTI: 2.76 cm2
AV Area mean vel: 3.07 cm2
AV Mean grad: 8.2 mmHg
AV Peak grad: 14.7 mmHg
Ao pk vel: 1.92 m/s
Area-P 1/2: 3.35 cm2
Calc EF: 73.2 %
Height: 70 in
MV VTI: 2.56 cm2
S' Lateral: 2.5 cm
Single Plane A2C EF: 70.4 %
Single Plane A4C EF: 75 %
Weight: 4790.4 [oz_av]

## 2024-03-16 LAB — MAGNESIUM: Magnesium: 1.6 mg/dL — ABNORMAL LOW (ref 1.7–2.4)

## 2024-03-16 LAB — BASIC METABOLIC PANEL WITH GFR
Anion gap: 11 (ref 5–15)
BUN: 21 mg/dL (ref 8–23)
CO2: 23 mmol/L (ref 22–32)
Calcium: 8.5 mg/dL — ABNORMAL LOW (ref 8.9–10.3)
Chloride: 105 mmol/L (ref 98–111)
Creatinine, Ser: 1.02 mg/dL (ref 0.61–1.24)
GFR, Estimated: >60 mL/min (ref >60)
Glucose, Bld: 109 mg/dL — ABNORMAL HIGH (ref 70–99)
Potassium: 3.8 mmol/L (ref 3.5–5.1)
Sodium: 139 mmol/L (ref 135–145)

## 2024-03-16 LAB — CBC
HCT: 37.5 % — ABNORMAL LOW (ref 39.0–52.0)
Hemoglobin: 12.8 g/dL — ABNORMAL LOW (ref 13.0–17.0)
MCH: 32.2 pg (ref 26.0–34.0)
MCHC: 34.1 g/dL (ref 30.0–36.0)
MCV: 94.5 fL (ref 80.0–100.0)
Platelets: 124 K/uL — ABNORMAL LOW (ref 150–400)
RBC: 3.97 MIL/uL — ABNORMAL LOW (ref 4.22–5.81)
RDW: 12.6 % (ref 11.5–15.5)
WBC: 8.7 K/uL (ref 4.0–10.5)
nRBC: 0 % (ref 0.0–0.2)

## 2024-03-16 MED ORDER — ACETAMINOPHEN 325 MG PO TABS
650.0000 mg | ORAL_TABLET | Freq: Four times a day (QID) | ORAL | Status: DC | PRN
  Administered 2024-03-16: 650 mg via ORAL
  Filled 2024-03-16: qty 2

## 2024-03-16 NOTE — Progress Notes (Signed)
 Mobility Specialist Progress Note;   03/16/24 0923  Mobility  Activity Ambulated with assistance (hallway)  Level of Assistance Contact guard assist, steadying assist  Assistive Device None  Distance Ambulated (ft) 150 ft  Activity Response Tolerated well  Mobility Referral Yes  Mobility visit 1 Mobility  Mobility Specialist Start Time (ACUTE ONLY) [id number]  Mobility Specialist Stop Time (ACUTE ONLY) 0930  Mobility Specialist Time Calculation (min) (ACUTE ONLY) 7 min   Pt agreeable to mobility. Required light MinG assistance during ambulation for safety. HR up to 165 bpm w/ exertion, recovered down to 105 bpm, no c/o from pt. Audible dyspnea displayed hwoever SPO2 96% when checked. No bleeding noted. Pt returned back to bed and left with all needs met, wife in room. RN present at [facility].  Lauraine Erm Mobility Specialist Please contact via SecureChat or [employer] [phone]

## 2024-03-16 NOTE — Progress Notes (Signed)
 Discussed with pt restrictions, exercise guidelines, and CRPII. Pt receptive. Will refer to AP CRPII.  [id number] Aliene Aris BS, ACSM-CEP 03/16/2024 11:05 AM

## 2024-03-16 NOTE — Progress Notes (Signed)
   03/16/24 1140  TOC Brief Assessment  Insurance and Status Reviewed  Patient has primary care physician Yes  Home environment has been reviewed home w/ spouse  Prior level of function: independent  Prior/Current Home Services No current home services  Social Drivers of Health Review SDOH reviewed no interventions necessary  Readmission risk has been reviewed Yes  Transition of care needs no transition of care needs at this time    Pt s/p TAVR, stable for transition home today, no HH or DME needs noted. Family to transport home

## 2024-03-16 NOTE — Plan of Care (Signed)
  Problem: Clinical Measurements: Goal: Will remain free from infection Outcome: Progressing   Problem: Nutrition: Goal: Adequate nutrition will be maintained Outcome: Progressing   Problem: Pain Managment: Goal: General experience of comfort will improve and/or be controlled Outcome: Progressing   Problem: Safety: Goal: Ability to remain free from injury will improve Outcome: Progressing

## 2024-03-17 ENCOUNTER — Telehealth: Payer: Self-pay

## 2024-03-17 NOTE — Telephone Encounter (Signed)
 Called to follow-up after Steven [name] took a shower. His groin site is nice and clean and the bandage was old blood. A new bandage was replaced for good measure.  They were grateful for follow-up call and will contact the office with any issues.

## 2024-03-17 NOTE — Telephone Encounter (Signed)
  [facility]   MULTIDISCIPLINARY HEART VALVE TEAM     Spoke with the patient and his wife regarding discharge from [facility] on 03/16/2024   Patient understands to follow up with Dr. Wonda on 03/24/2024 at [address] [facility]. Patient understands discharge instructions? yes Patient understands medications and regimen? yes Patient understands to bring all medications to this visit? Yes  The patient reports bruising at his groin site and a bloody bandage but no swelling or pain. He is about to take a shower. He will remove the bandage but not apply soap or lotion to the site. He will apply a new bandage and continue to monitor. He will call back if there is active bleeding.

## 2024-03-24 ENCOUNTER — Ambulatory Visit: Attending: Cardiovascular Disease | Admitting: Cardiovascular Disease

## 2024-03-24 ENCOUNTER — Encounter: Payer: Self-pay | Admitting: Cardiovascular Disease

## 2024-03-24 VITALS — BP 134/80 | HR 72 | Ht 70.0 in | Wt 295.0 lb

## 2024-03-24 DIAGNOSIS — I25118 Atherosclerotic heart disease of native coronary artery with other forms of angina pectoris: Secondary | ICD-10-CM | POA: Diagnosis not present

## 2024-03-24 DIAGNOSIS — Z952 Presence of prosthetic heart valve: Secondary | ICD-10-CM

## 2024-03-24 DIAGNOSIS — Z6841 Body Mass Index (BMI) 40.0 and over, adult: Secondary | ICD-10-CM

## 2024-03-24 DIAGNOSIS — I4819 Other persistent atrial fibrillation: Secondary | ICD-10-CM | POA: Diagnosis not present

## 2024-03-24 NOTE — Assessment & Plan Note (Signed)
 Remains well rate controlled.  No symptoms of AV block.  Continue apixaban .

## 2024-03-24 NOTE — Assessment & Plan Note (Signed)
 Normal valve function noted on his postoperative day #1 echo.  He will continue current medications.  He continues to demonstrate a new left bundle branch block following TAVR and is heart rate in atrial fibrillation is 72 bpm.  Continue current management.  Follow-up as planned in January with an echocardiogram before the office visit.

## 2024-03-24 NOTE — Progress Notes (Signed)
 Cardiology Office Note:    Date:  03/24/2024   ID:  Steven [name], DOB February 18, 1941, MRN [id number]  PCP:  Alphonsa Glendia LABOR, MD   [facility] Providers Cardiologist:  [name], MD Structural Heart:  [name], MD    Referring MD: Alphonsa Glendia LABOR, MD   Chief Complaint  Patient presents with   Atrial Fibrillation    History of Present Illness:    Steven [name] is a 83 y.o. male presenting for hospital follow-up after undergoing transfemoral TAVR 03/15/24 using a 29 mm Sapien 3 valve. Comorbid conditions include morbid obesity, HTN, thoracic aortic aneurysm, permanent atrial fibrillation, and CAD. POD#1 echo after TAVR showed normal LVEF of 65-70%, normal TAVR valve function with mean gradient 8 mmHg, and no paravalvular regurgitation.   Steven [name] is here with his wife today.  He feels like he has had a upper respiratory infection since he left the hospital.  He complains of some sinus congestion and wheezing.  He otherwise reports no change in symptoms and specifically has not had any chest pain, chest pressure, leg swelling, orthopnea, or PND.   Current Medications: Active Medications[1]   Allergies:   Ciprofloxacin, Oxycodone-acetaminophen , and Oxycodone-acetaminophen    ROS:   Please see the history of present illness.    All other systems reviewed and are negative.  EKGs/Labs/Other Studies Reviewed:    The following studies were reviewed today: Cardiac Studies & Procedures   ______________________________________________________________________________________________ CARDIAC CATHETERIZATION  CARDIAC CATHETERIZATION 01/18/2024  Conclusion   Prox RCA to Mid RCA lesion is 30% stenosed.   Dist RCA lesion is 50% stenosed with 50% stenosed side branch in RPDA.   RPAV lesion is 70% stenosed.   Mid LM to Dist LM lesion is 40% stenosed.   Prox LAD to Mid LAD lesion is 60% stenosed.  1.  Calcified, restricted aortic valve on fluoroscopy consistent with the  patient's known history of severe aortic stenosis 2.  Right heart catheterization data: RA mean 2 mmHg RV 23/7 mmHg PA 26/19 mean 24 mmHg Pulmonary capillary wedge pressure A-wave 12 mmHg, V wave 13 mmHg, mean 13 mmHg Cardiac output/cardiac index 5.65/2.28 3.  Mild distal left main stenosis of 30 to 40% 4.  Moderate mid LAD stenosis of 60 to 70% 5.  Patent, small left circumflex with patent intermediate branch 6.  Large, dominant RCA with mild nonobstructive plaquing and moderate distal bifurcation stenosis involving the distal RCA, PDA, and PLA branches  Recommendations: Medical therapy for coronary artery disease, continue TAVR evaluation  Findings Coronary Findings Diagnostic  Dominance: Right  Left Main The left main is patent with mild 30 to 40% distal left main stenosis extending into the ostium of the LAD Mid LM to Dist LM lesion is 40% stenosed.  Left Anterior Descending The LAD has an eccentric 60 to 70% plaque in the mid vessel after the first septal perforator. Prox LAD to Mid LAD lesion is 60% stenosed.  Ramus Intermedius There is mild diffuse disease throughout the vessel.  Left Circumflex Vessel is small. There is mild diffuse disease throughout the vessel. The circumflex is small.  It supplies a single very small OM branch.  The intermediate branch is moderate in caliber with no significant stenosis.  First Obtuse Marginal Branch Vessel is small in size.  Right Coronary Artery There is mild diffuse disease throughout the vessel. The vessel is moderately calcified. Large, dominant vessel.  The mid vessel has mild 30 to 40% stenosis.  The distal vessel is  calcified at the bifurcation of the PDA and posterior AV segment with 50% stenosis in each of the branch vessels ([place] 1, 1, 1) and a second 70% stenosis in the PLV branch. Prox RCA to Mid RCA lesion is 30% stenosed. Dist RCA lesion is 50% stenosed with 50% stenosed side branch in RPDA.  Right Posterior  Atrioventricular Artery RPAV lesion is 70% stenosed.  Intervention  No interventions have been documented.   STRESS TESTS  MYOCARDIAL PERFUSION IMAGING 09/22/2017  Interpretation Summary  Nuclear stress EF: 64%. The left ventricular ejection fraction is normal (55-65%).  The study is normal.  This is a low risk study. no ischemia. no evidence of infarction   ECHOCARDIOGRAM  ECHOCARDIOGRAM COMPLETE 03/16/2024  Narrative ECHOCARDIOGRAM REPORT    Patient Name:   Steven [name] Date of Exam: 03/16/2024 Medical Rec #:  [id number]     Height:       70.0 in Accession #:    [id number]    Weight:       299.4 lb Date of Birth:  [birth year]      BSA:          2.478 m Patient Age:    83 years      BP:           149/74 mmHg Patient Gender: M             HR:           100 bpm. Exam Location:  Inpatient  Procedure: 2D Echo (Both Spectral and Color Flow Doppler were utilized during procedure).  Indications:    Post TAVR evaluation  History:        Patient has prior history of Echocardiogram examinations. CAD; Risk Factors:Hypertension. Aortic Valve: 29 mm Edwards Sapien prosthetic, stented (TAVR) valve is present in the aortic position. Procedure Date: 03/15/2024.  Sonographer:    Charmaine Gaskins Referring Phys: [id number] KATHRYN R THOMPSON  IMPRESSIONS   1. Left ventricular ejection fraction, by estimation, is 65 to 70%. Left ventricular ejection fraction by 2D MOD biplane is 73.2 %. The left ventricle has normal function. The left ventricle has no regional wall motion abnormalities. Left ventricular diastolic function could not be evaluated. 2. Right ventricular systolic function is normal. The right ventricular size is normal. 3. Left atrial size was severely dilated. 4. MVA 1.91 cm^2 by 2D Planimetry. The mitral valve is degenerative. No evidence of mitral valve regurgitation. Mild mitral stenosis. Moderate to severe mitral annular calcification. 5. Well seated with normal leaflet  opening and normal function (PV 1.9 m/sec, MG 8.2 mmHg, and DVI 0.67) with no evidence of transvalvular or paravalvular regurgitation. . The aortic valve has been repaired/replaced. Aortic valve regurgitation is not visualized. No aortic stenosis is present. There is a 29 mm Edwards Sapien prosthetic (TAVR) valve present in the aortic position. Procedure Date: 03/15/2024. 6. There is moderate dilatation of the ascending aorta, measuring 45 mm. 7. The inferior vena cava is normal in size with greater than 50% respiratory variability, suggesting right atrial pressure of 3 mmHg.  Comparison(s): Changes from prior study are noted. S/p TAVR.  FINDINGS Left Ventricle: Left ventricular ejection fraction, by estimation, is 65 to 70%. Left ventricular ejection fraction by 2D MOD biplane is 73.2 %. The left ventricle has normal function. The left ventricle has no regional wall motion abnormalities. The left ventricular internal cavity size was normal in size. There is no left ventricular hypertrophy. Left ventricular diastolic function could not be evaluated due to  mitral annular calcification (moderate or greater). Left ventricular diastolic function could not be evaluated.  Right Ventricle: The right ventricular size is normal. No increase in right ventricular wall thickness. Right ventricular systolic function is normal.  Left Atrium: Left atrial size was severely dilated.  Right Atrium: Right atrial size was normal in size.  Pericardium: There is no evidence of pericardial effusion.  Mitral Valve: MVA 1.91 cm^2 by 2D Planimetry. The mitral valve is degenerative in appearance. Moderate to severe mitral annular calcification. No evidence of mitral valve regurgitation. Mild mitral valve stenosis. MV peak gradient, 9.6 mmHg. The mean mitral valve gradient is 3.3 mmHg.  Tricuspid Valve: The tricuspid valve is normal in structure. Tricuspid valve regurgitation is not demonstrated. No evidence of tricuspid  stenosis.  Aortic Valve: Well seated with normal leaflet opening and normal function (PV 1.9 m/sec, MG 8.2 mmHg, and DVI 0.67) with no evidence of transvalvular or paravalvular regurgitation. The aortic valve has been repaired/replaced. Aortic valve regurgitation is not visualized. No aortic stenosis is present. Aortic valve mean gradient measures 8.2 mmHg. Aortic valve peak gradient measures 14.7 mmHg. Aortic valve area, by VTI measures 2.76 cm. There is a 29 mm Edwards Sapien prosthetic, stented (TAVR) valve present in the aortic position. Procedure Date: 03/15/2024.  Pulmonic Valve: The pulmonic valve was not well visualized. Pulmonic valve regurgitation is not visualized. No evidence of pulmonic stenosis.  Aorta: The aortic root is normal in size and structure. There is moderate dilatation of the ascending aorta, measuring 45 mm.  Venous: The inferior vena cava is normal in size with greater than 50% respiratory variability, suggesting right atrial pressure of 3 mmHg.  IAS/Shunts: No atrial level shunt detected by color flow Doppler.   LEFT VENTRICLE PLAX 2D                        Biplane EF (MOD) LVIDd:         5.00 cm         LV Biplane EF:   Left LVIDs:         2.50 cm                          ventricular LV PW:         1.20 cm                          ejection LV IVS:        1.10 cm                          fraction by LVOT diam:     2.27 cm                          2D MOD LV SV:         86                               biplane is LV SV Index:   35                               73.2 %. LVOT Area:     4.05 cm Diastology LV e' medial:    7.14 cm/s LV Volumes (  MOD)               LV E/e' medial:  21.6 LV vol d, MOD    89.0 ml       LV e' lateral:   8.09 cm/s A2C:                           LV E/e' lateral: 19.0 LV vol d, MOD    115.0 ml A4C: LV vol s, MOD    26.3 ml A2C: LV vol s, MOD    28.8 ml A4C: LV SV MOD A2C:   62.7 ml LV SV MOD A4C:   115.0 ml LV SV MOD BP:    73.8  ml  RIGHT VENTRICLE RV Basal diam:  3.23 cm RV Mid diam:    2.69 cm RV S prime:     14.50 cm/s  LEFT ATRIUM              Index        RIGHT ATRIUM           Index LA diam:        5.39 cm  2.18 cm/m   RA Area:     23.30 cm LA Vol (A2C):   153.0 ml 61.75 ml/m  RA Volume:   61.60 ml  24.86 ml/m LA Vol (A4C):   170.0 ml 68.61 ml/m LA Biplane Vol: 174.0 ml 70.22 ml/m AORTIC VALVE AV Area (Vmax):    2.69 cm AV Area (Vmean):   3.07 cm AV Area (VTI):     2.76 cm AV Vmax:           192.00 cm/s AV Vmean:          132.000 cm/s AV VTI:            0.310 m AV Peak Grad:      14.7 mmHg AV Mean Grad:      8.2 mmHg LVOT Vmax:         127.67 cm/s LVOT Vmean:        100.233 cm/s LVOT VTI:          0.211 m LVOT/AV VTI ratio: 0.68  AORTA Ao Root diam: 3.16 cm Ao Asc diam:  4.53 cm  MITRAL VALVE MV Area (PHT): 3.35 cm     SHUNTS MV Area VTI:   2.56 cm     Systemic VTI:  0.21 m MV Peak grad:  9.6 mmHg     Systemic Diam: 2.27 cm MV Mean grad:  3.3 mmHg MV Vmax:       1.55 m/s MV Vmean:      89.3 cm/s MV Decel Time: 227 msec MV E velocity: 154.00 cm/s  Joelle Azobou Tonleu Electronically signed by Joelle Cedars Tonleu Signature Date/Time: 03/16/2024/10:34:20 AM    Final    MONITORS  LONG TERM MONITOR (3-14 DAYS) 05/02/2019  Narrative 1.  The basic rhythm is atrial fibrillation with an average ventricular rate of 75 bpm, atrial fibrillation burden 100% 2.  Occasional wide-complex beats, PVCs versus aberrancy 3.  No high-grade heart block or pathologic pauses greater than 3 seconds   CT SCANS  CT CORONARY MORPH W/CTA COR W/SCORE 01/26/2024  Addendum 02/03/2024 11:41 PM ADDENDUM REPORT: 02/03/2024 23:39  EXAM: OVER-READ INTERPRETATION  CT CHEST  The following report is an over-read performed by radiologist Dr. Oneil Devonshire of [facility] on 02/03/2024. This over-read does not include interpretation of cardiac or coronary anatomy  or pathology. The  coronary calcium  score/coronary CTA interpretation by the cardiologist is attached.  COMPARISON:  CT of the chest from earlier in the same day.  FINDINGS: Cardiovascular: Dilatation of the ascending aorta to 4.5 cm is noted. No dissection is noted. Atherosclerotic calcifications seen.  Mediastinum/Nodes: There are no enlarged lymph nodes within the visualized mediastinum.  Lungs/Pleura: There is no pleural effusion. The visualized lungs appear clear.  Upper abdomen: No significant findings in the visualized upper abdomen.  Musculoskeletal/Chest wall: No chest wall mass or suspicious osseous findings within the visualized chest.  IMPRESSION: Dilatation of the ascending aorta is noted to 4.5 cm. Ascending thoracic aortic aneurysm. Recommend semi-annual imaging followup by CTA or MRA and referral to cardiothoracic surgery if not already obtained. This recommendation follows 2010 ACCF/AHA/AATS/ACR/ASA/SCA/SCAI/SIR/STS/SVM Guidelines for the Diagnosis and Management of Patients With Thoracic Aortic Disease. Circulation. 2010; 121: Z733-z630. Aortic aneurysm NOS (ICD10-I71.9)   Electronically Signed By: Oneil Devonshire M.D. On: 02/03/2024 23:39  Narrative CLINICAL DATA:  Severe Aortic Stenosis.  EXAM: Cardiac TAVR CT  TECHNIQUE: A non-contrast, gated CT scan was obtained with axial slices of 2.5 mm through the heart for aortic valve scoring. A 120 kV retrospective, gated, contrast cardiac scan was obtained. Gantry rotation speed was 230 msec and collimation was 0.63 mm. Nitroglycerin  was not given. A delayed scan was obtained to exclude left atrial appendage thrombus. The 3D dataset was reconstructed in systole with motion correction. The 3D data set was reconstructed in 5% intervals of the 0-95% of the R-R cycle. Systolic and diastolic phases were analyzed on a dedicated workstation using MPR, MIP, and VRT modes. The patient received 100 cc of contrast.  FINDINGS: Aortic  Valve: Valve Morphology: Tricuspid with symmetric calcium  distribution and reduced excursion the planimeter valve area is 1.20 sq cm consistent with moderate to severe aortic stenosis  Annular and subannular calcification: Annular and sub-annular with single calcified protuberance that extends into the LVOT  Aortic Valve Calcium  score: 2485  Perimembranous septal diameter: 6 mm  Mitral Valve: Moderate mitral annular calcification  Aortic Annulus Measurements-25% Phase  Major annulus diameter: 33 mm  Minor annulus diameter:26 mm  Annular perimeter: 92 mm  Annular area: 6.30 cm2  Aortic Measurements- 75% phase  Sinotubular Junction: 37 mm  Ascending Thoracic Aorta: 47 mm - moderate dilation  Aortic atherosclerosis.  Sinus of Valsalva Measurements:  Right coronary cusp width: 35 mm  Left coronary cusp width: 35 mm  Non coronary cusp width: 35 mm  Coronary Artery Height above Annulus:  Left Main: 20 mm  Left SoV height: 26 mm  Right Coronary: 22 mm  Right SoV height: 26 mm  Optimum Fluoroscopic Angle for Delivery: LAO 2, CAU 2  Valves for structural team consideration:  29 mm Sapien  34 mm Evolut  Non TAVR Valve Findings:  Coronary Arteries: Normal coronary origin. Study not completed with nitroglycerin . With this caveat:  Moderate RCA disease mid RCA.  Calcified proximal LAD disease- lack of nitroglycerin - correlation to heart catheterization advised.  Calcified ostial LCX disease- lack of nitroglycerin - correlation to heart catheterization advised.  Coronary Calcium  Score:  Left main: 0  Left anterior descending artery: 920  Left circumflex artery: 72  Right coronary artery: 1357  Total: 2350  Percentile: 84 for age, sex, and race matched control.  Systemic veins: Normal anatomy.  Main Pulmonary artery: Mild dilation, 29 mm.  Pulmonary veins: Not well visualized.  Left atrial appendage: Incomplete opacification of the distal  tip; suspect delayed opacification  but cannot rule out thrombus due to lack of delayed phase.  Interatrial septum: No clear communication.  Chamber dimensions: Right ventricular dilation; bi-atrial dilation.  Pericardium: No calcification.  Extra Cardiac Findings as per separate reporting.  Notable artifacts: Poor contrast opacification  Image quality: Fair  IMPRESSION: 1. Severe Aortic stenosis. Measurements for potential interventions as above.  Stanly Leavens MD  Electronically Signed: By: Stanly Leavens M.D. On: 01/26/2024 14:55     ______________________________________________________________________________________________      EKG:   EKG Interpretation Date/Time:  Thursday March 24 2024 12:02:12 EST Ventricular Rate:  72 PR Interval:    QRS Duration:  150 QT Interval:  426 QTC Calculation: 466 R Axis:   104  Text Interpretation: Atrial fibrillation with premature ventricular or aberrantly conducted complexes Rightward axis Non-specific intra-ventricular conduction block When compared with ECG of 16-Mar-2024 04:52, QRS duration has increased ST now depressed in Inferior leads ST less depressed in Lateral leads Inverted T waves have replaced nonspecific T wave abnormality in Inferior leads Confirmed by Wonda Sharper [phone]) on 03/24/2024 12:14:14 PM    Recent Labs: 03/11/2024: ALT 14 03/16/2024: BUN 21; Creatinine, Ser 1.02; Hemoglobin 12.8; Magnesium  1.6; Platelets 124; Potassium 3.8; Sodium 139  Recent Lipid Panel    Component Value Date/Time   CHOL 126 11/30/2023 0820   TRIG 88 11/30/2023 0820   HDL 40 11/30/2023 0820   CHOLHDL 3.2 11/30/2023 0820   CHOLHDL 3.4 07/20/2015 0435   VLDL 19 07/20/2015 0435   LDLCALC 69 11/30/2023 0820     Risk Assessment/Calculations:    CHA2DS2-VASc Score = 5   This indicates a 7.2% annual risk of stroke. The patient's score is based upon: CHF History: 1 HTN History: 1 Diabetes History:  0 Stroke History: 0 Vascular Disease History: 1 Age Score: 2 Gender Score: 0               Physical Exam:    VS:  BP 134/80 (BP Location: Right Arm, Patient Position: Sitting, Cuff Size: Large)   Pulse 72   Ht 5' 10 (1.778 m)   Wt 295 lb (133.8 kg)   SpO2 96%   BMI 42.33 kg/m     Wt Readings from Last 3 Encounters:  03/24/24 295 lb (133.8 kg)  03/16/24 299 lb 6.4 oz (135.8 kg)  03/03/24 300 lb (136.1 kg)     GEN:  Well nourished, well developed in no acute distress HEENT: Normal NECK: No JVD; No carotid bruits LYMPHATICS: No lymphadenopathy CARDIAC: Irregularly irregular with 2/6 systolic murmur at the right upper sternal border RESPIRATORY:  Clear to auscultation without rales, wheezing or rhonchi  ABDOMEN: Soft, non-tender, non-distended MUSCULOSKELETAL:  No edema; No deformity  SKIN: Warm and dry NEUROLOGIC:  Alert and oriented x 3 PSYCHIATRIC:  Normal affect   Assessment & Plan S/P TAVR (transcatheter aortic valve replacement) Normal valve function noted on his postoperative day #1 echo.  He will continue current medications.  He continues to demonstrate a new left bundle branch block following TAVR and is heart rate in atrial fibrillation is 72 bpm.  Continue current management.  Follow-up as planned in January with an echocardiogram before the office visit. Persistent atrial fibrillation (HCC) Remains well rate controlled.  No symptoms of AV block.  Continue apixaban . Coronary artery disease of native artery of native heart with stable angina pectoris No anginal symptoms.  Patient is off of antiplatelet therapy because of chronic oral anticoagulation. BMI 40.0-44.9, adult [facility]) He will benefit from outpatient cardiac  rehab and plans to initiate this after 1 January.  Discussed portion control and weight loss measures today.            Medication Adjustments/Labs and Tests Ordered: Current medicines are reviewed at length with the patient today.   Concerns regarding medicines are outlined above.  Orders Placed This Encounter  Procedures   EKG 12-Lead   No orders of the defined types were placed in this encounter.   Patient Instructions  Medication Instructions:  Your physician recommends that you continue on your current medications as directed. Please refer to the Current Medication list given to you today.  *If you need a refill on your cardiac medications before your next appointment, please call your pharmacy*  Lab Work: None.  If you have labs (blood work) drawn today and your tests are completely normal, you will receive your results only by: MyChart Message (if you have MyChart) OR A paper copy in the mail If you have any lab test that is abnormal or we need to change your treatment, we will call you to review the results.  Testing/Procedures: None.  Follow-Up: At [facility], you and your health needs are our priority.  As part of our continuing mission to provide you with exceptional heart care, our providers are all part of one team.  This team includes your primary Cardiologist (physician) and Advanced Practice Providers or APPs (Physician Assistants and Nurse Practitioners) who all work together to provide you with the care you need, when you need it.       Signed, [name], MD  03/24/2024 4:47 PM    [facility]     [1]  Current Meds  Medication Sig   ALPRAZolam  (XANAX ) 0.5 MG tablet Take 0.5 mg by mouth at bedtime as needed for anxiety.   Ascorbic Acid (VITAMIN C) 1000 MG tablet Take 1,000 mg by mouth daily.   atorvastatin  (LIPITOR ) 80 MG tablet Take 1 tablet (80 mg total) by mouth daily.   Cholecalciferol (VITAMIN D) 50 MCG (2000 UT) CAPS Take 2,000 Units by mouth daily.   cyanocobalamin (VITAMIN B12) 1000 MCG tablet Take by mouth daily.   diclofenac Sodium (VOLTAREN) 1 % GEL Apply 2 g topically daily as needed (Knee pain).   DULoxetine  (CYMBALTA ) 60 MG capsule Take 1  capsule (60 mg total) by mouth daily.   ELIQUIS  5 MG TABS tablet TAKE (1) TABLET BY MOUTH TWICE DAILY.   lisinopril  (ZESTRIL ) 2.5 MG tablet Take 1 tablet (2.5 mg total) by mouth daily.   Omega-3 Fatty Acids (FISH OIL) 1000 MG CAPS Take 1,000 mg by mouth daily.   pantoprazole  (PROTONIX ) 40 MG tablet Take 1 tablet (40 mg total) by mouth daily.   Polyethyl Glycol-Propyl Glycol (SYSTANE ULTRA) 0.4-0.3 % SOLN Place 1 drop into both eyes daily as needed (Dry eyes).   Probiotic Product (ALIGN) 10 MG CAPS Take 10 mg by mouth every morning.   tamsulosin  (FLOMAX ) 0.4 MG CAPS capsule Take 1 capsule (0.4 mg total) by mouth daily.   zinc gluconate 50 MG tablet Take 50 mg by mouth daily.   zonisamide  (ZONEGRAN ) 100 MG capsule TAKE (2) CAPSULES BY MOUTH ONCE DAILY.   [DISCONTINUED] ALPRAZolam  (XANAX ) 0.5 MG tablet Take 1 tablet (0.5 mg total) by mouth at bedtime as needed for sleep (only for use during sleep study, patient needs designated driver in AM).

## 2024-03-24 NOTE — Patient Instructions (Signed)
 Medication Instructions:  Your physician recommends that you continue on your [place] medications as directed. Please refer to the [facility] Medication list given to you today.  *If you need a refill on your cardiac medications before your next appointment, please call your pharmacy*  Lab Work: None.  If you have labs (blood work) drawn today and your tests are completely normal, you will receive your results only by: MyChart Message (if you have MyChart) OR A paper copy in the mail If you have any lab test that is abnormal or we need to change your treatment, we will call you to review the results.  Testing/Procedures: None.  Follow-Up: At [facility], you and your health needs are our priority.  As part of our continuing mission to provide you with exceptional heart care, our providers are all part of one team.  This team includes your primary Cardiologist (physician) and Advanced Practice Providers or APPs (Physician Assistants and Nurse Practitioners) who all work together to provide you with the care you need, when you need it.

## 2024-03-28 ENCOUNTER — Ambulatory Visit: Admitting: Cardiovascular Disease

## 2024-04-03 ENCOUNTER — Ambulatory Visit: Admitting: Neurology

## 2024-04-03 DIAGNOSIS — Z9189 Other specified personal risk factors, not elsewhere classified: Secondary | ICD-10-CM

## 2024-04-03 DIAGNOSIS — G4739 Other sleep apnea: Secondary | ICD-10-CM

## 2024-04-03 DIAGNOSIS — I25110 Atherosclerotic heart disease of native coronary artery with unstable angina pectoris: Secondary | ICD-10-CM

## 2024-04-03 DIAGNOSIS — G40109 Localization-related (focal) (partial) symptomatic epilepsy and epileptic syndromes with simple partial seizures, not intractable, without status epilepticus: Secondary | ICD-10-CM

## 2024-04-03 DIAGNOSIS — R400 Somnolence: Secondary | ICD-10-CM

## 2024-04-03 DIAGNOSIS — I4819 Other persistent atrial fibrillation: Secondary | ICD-10-CM

## 2024-04-03 DIAGNOSIS — G4733 Obstructive sleep apnea (adult) (pediatric): Secondary | ICD-10-CM

## 2024-04-11 ENCOUNTER — Other Ambulatory Visit: Payer: Self-pay | Admitting: Family Medicine

## 2024-04-11 DIAGNOSIS — I4819 Other persistent atrial fibrillation: Secondary | ICD-10-CM

## 2024-04-13 ENCOUNTER — Ambulatory Visit: Payer: Self-pay | Admitting: Neurology

## 2024-04-13 DIAGNOSIS — Z9189 Other specified personal risk factors, not elsewhere classified: Secondary | ICD-10-CM | POA: Insufficient documentation

## 2024-04-13 DIAGNOSIS — R400 Somnolence: Secondary | ICD-10-CM | POA: Insufficient documentation

## 2024-04-13 DIAGNOSIS — G4739 Other sleep apnea: Secondary | ICD-10-CM | POA: Insufficient documentation

## 2024-04-13 NOTE — Procedures (Signed)
 "  [facility] PAP TITRATION INTERPRETATION REPORT   STUDY DATE: 04/03/2024      PATIENT NAME:  Steven [name]         DATE OF BIRTH:  1940/06/12  PATIENT ID:  [id number]    TYPE OF STUDY:  CPAP   PHYSICIAN: DEDRA GORES, MD Dr. Alphonsa  SCORING TECHNICIAN: Jesusa Haddock, RPSGT   HISTORY: This 83 year-old Male returns for in lab titration following his HST- 01-27-2024 AHI 35/h by AASM and 26.2/h by CMS scoring REM AI 36. NREM AHI 28. 13% central apneas. This HST confirms the presence of moderate- severe complex sleep apnea; there were mostly obstructive but also central apneas present. Oxygen saturation was in normal limits, cardiac rate was abnormal- fast and slow, suspect for atrial fib. RECOMMENDATION: This patient would be best served with positive airway pressure therapy, either CPAP or BiPAP. Only these modalities are potentially helpful in reducing the atrial fibrillation that is underlying. Central apneas do not respond to dental devices or implanted tongue stimulators. Plan A) I would prefer to see a titration in the sleep lab in room 2 with an adjustable bed that can be elevated to comfortable levels for this patient. This allows for a sensible mask fitting as well. Xanax  for asleep aid ordered.  Weight loss is strongly recommended. The degree of sleep apnea ( moderate - severe) seen in this test would qualify the patient for FDA approved Zep[facility]) medication, but that doesn't mean that insurance will cover these medications.  The Epworth Sleepiness Scale was 12 out of 24 (scores above or equal to 10 are suggestive of hypersomnolence).  DESCRIPTION: A sleep technologist was in attendance for the duration of the recording.  Data collection, scoring, video monitoring, and reporting were performed in compliance with the AASM Manual for the Scoring of Sleep and Associated Events; (Hypopnea is scored based on the criteria listed in Section VIII D. 1b  in the AASM Manual V2.6 using a 4% oxygen desaturation rule or Hypopnea is scored based on the criteria listed in Section VIII D. 1a in the AASM Manual V2.6 using 3% oxygen desaturation and /or arousal rule).  A physician certified by the American Board of Sleep Medicine reviewed each epoch of the study.  ADDITIONAL INFORMATION:  Height: 70.0 in Weight: 301 lb (BMI 43) Neck Size: 18.5 in MEDICATIONS: Lipitor , Vitamin D, Vitamin B-12, Cymbalta , Eloquis, Zestril , Fish Oil, Protonix , Align Probiotic, Flomax , Zonegran   SLEEP CONTINUITY AND SLEEP ARCHITECTURE: FFM Evora in size S/M was chosen, started titration at 5 cm water .  Lights off was at 21:20: and lights on 05:02: (7.7 hours in bed). Total sleep time was 369.5 minutes (11.0% supine;  89.0% lateral;  0.0% prone, 16.1% REM sleep), with a decreased sleep efficiency at 79.9%. Sleep latency was normal at 29.5 minutes.  Of the total sleep time, the percentage of stage N1 sleep was 4.2%, stage N2 sleep was 75.5%, stage N3 sleep was 4.2%, and REM sleep was 16.1%. There were 2 Stage R periods observed on this study night, 21 awakenings (i.e. transitions to Stage W from any sleep stage), and 68.0 total stage transitions. Wake after sleep onset (WASO) time accounted for 63 minutes. Only mild snoring was recorded.  AROUSAL: There were 81 arousals in total, for an arousal index of 13.1 /hour.  Of these, only 4 were identified as respiratory-related arousals (0.6 /h), 1 was PLM-related arousal (0.2 /h), and 76 were non-specific arousals (12.3 /h)  RESPIRATORY  MONITORING:  Based on CMS criteria (using a 4% oxygen desaturation rule for scoring hypopneas), there were 12 apneas (1 obstructive; 10 central; 1 mixed), and 32 hypopneas.  Apnea index was 1.9. Hypopnea index was 5.2. The apnea-hypopnea index was 7.1 overall (4.4 supine, 5.0 non-supine; 5.0 REM, 0.0 supine REM). There were 0 respiratory effort-related arousals (RERAs).   OXIMETRY: Total sleep time spent below 89%  oxygen saturation 0.2 minutes, or 0.1% of total sleep time. Respiratory events were associated with oxyhemoglobin desaturation- the nadir during sleep was 88% from a mean of 96%.  EKG: The electrocardiogram documented an average heart rate while asleep at 72 bpm.  highly irregular heart rate, missing p -wave. The maximum heart rate during sleep was 114 bpm. Minimum heart rate during recording was 44 bpm.     BODY POSITION: Duration of total sleep and percent of total sleep in their respective position is as follows: supine 40 minutes (11.0%), non-supine 329.0 minutes (89.0%); right 94 minutes (25.4%), left 235 minutes (63.6%), and prone 00 minutes (0.0%). Total supine REM sleep time was 00 minutes (0.0% of total REM sleep). LIMB MOVEMENTS: There were 116 periodic limb movements of sleep (18.8/h), of which 1 (0.2/h) was associated with an arousal.    IMPRESSION:  1) This OSA/  complex sleep apnea with 13 % central apneas as seen on HST results was well controlled under CPAP. The last explored pressure was 14 cm water , 1 cm EPR,  resulting in an AHI of 1.4/h.  The settings of 9 and 10 cm water  also were successful in controlling apnea.  2) Atrial fibrillation was persistent through the night. 3) leg movements were associated with respiratory events, and once apnea was well controlled the leg movements stopped as well.       RECOMMENDATIONS:  CPAP therapy would provide the most effective control of sleep apnea.     DME order dent for auto- titration ResMed device with a setting from 9-15 cm water  , 1 cm EPR , heated humidification and mask of patient's choice.    2) Further recommended:  weight loss, followed by a medical provider and with the help of medication if necessary . the baseline HST documented a moderate -severe sleep apnea with dominantly obstructive apnea.    3) atrial fib, aortic stenosis and CAD:  followed by PCP and cardiology, no need for oxygen was identified during this study.,     RV with new CPAP in 90 days , prefer visit with NP.    DEDRA GORES,  MD            Name: ALEJO, BEAMER BMI: 56 Physician: Mylie Mccurley, Dedra   ID: [id number] Height: 70 in Technician: Harvey Brisker  Sex: Male Weight: 301 lb Record: [id number]  Age: 4 [1941-02-04] Date: 04/03/2024 Scorer: Brisker Fields   Recommended Settings IPAP: N/A cmH20 EPAP: N/A cmH2O AHI: N/A AHI (4%): N/A   Pressure IPAP/EPAP 00 05 07 08 09 10 11 12    O2 Vol 0.0 0.0 0.0 0.0 0.0 0.0 0.0 0.0  Time TRT 0.62m 46.31m 6.26m 57.27m 23.26m 111.60m 21.72m 24.44m   TST 0.61m 17.25m 6.54m 50.49m 23.59m 104.63m 20.60m 23.31m  Sleep Stage % Wake 100.0 62.4 0.0 12.2 0.0 6.3 4.7 4.2   % REM 0.0 0.0 0.0 0.0 0.0 7.7 95.1 100.0   % N1 0.0 8.6 0.0 4.0 0.0 3.3 0.0 0.0   % N2 0.0 91.4 100.0 65.3 100.0 89.0 4.9 0.0   % N3 0.0 0.0 0.0 30.7 0.0  0.0 0.0 0.0  Respiratory Total Events 0 22 8 5  0 5 2 3    Obs. Apn. 0 1 0 0 0 0 0 0   Mixed Apn. 0 0 0 0 0 0 0 0   Cen. Apn. 0 2 0 0 0 0 0 2   Hypopneas 0 19 8 5  0 5 2 1    AHI 0.00 75.43 80.00 5.94 0.00 2.87 5.85 7.83   Supine AHI 0.00 0.00 0.00 6.32 0.00 0.00 0.00 0.00   Prone AHI 0.00 0.00 0.00 0.00 0.00 0.00 0.00 0.00   Side AHI 0.00 75.43 80.00 5.71 0.00 2.87 5.85 7.83  Respiratory (4%) Hypopneas (4%) 0.00 17.00 6.00 4.00 0.00 2.00 1.00 1.00   AHI (4%) 0.00 68.57 60.00 4.75 0.00 1.15 2.93 7.83   Supine AHI (4%) 0.00 0.00 0.00 6.32 0.00 0.00 0.00 0.00   Prone AHI (4%) 0.00 0.00 0.00 0.00 0.00 0.00 0.00 0.00   Side AHI (4%) 0.00 68.57 60.00 3.81 0.00 1.15 2.93 7.83  Desat Profile <= 90% 0.71m 0.67m 0.46m 0.15m 0.42m 0.32m 0.40m 0.50m   <= 80% 0.52m 0.72m 0.20m 0.73m 0.72m 0.72m 0.63m 0.12m   <= 70% 0.85m 0.59m 0.57m 0.70m 0.28m 0.4m 0.60m 0.50m   <= 60% 0.16m 0.24m 0.10m 0.76m 0.86m 0.28m 0.40m 0.83m  Arousal Index Apnea 0.0 0.0 0.0 0.0 0.0 0.0 0.0 0.0   Hypopnea 0.0 0.0 0.0 2.4 0.0 0.0 2.9 0.0   LM 0.0 0.0 0.0 0.0 0.0 0.6 0.0 0.0   Spontaneous 0.0 6.9 0.0 10.7 13.0 12.1 17.6 13.0        Pressure  IPAP/EPAP 13 14   O2 Vol 0.0 0.0  Time TRT 49.80m 122.25m   TST 36.48m 88.24m  Sleep Stage % Wake 27.3 27.8   % REM 25.0 0.0   % N1 4.2 7.9   % N2 70.8 92.1   % N3 0.0 0.0  Respiratory Total Events 16 2   Obs. Apn. 0 0   Mixed Apn. 1 0   Cen. Apn. 6 0   Hypopneas 9 2   AHI 26.67 1.36   Supine AHI 22.86 10.91   Prone AHI 0.00 0.00   Side AHI 28.24 0.00  Respiratory (4%) Hypopneas (4%) 1.00 0.00   AHI (4%) 13.33 0.00   Supine AHI (4%) 5.71 0.00   Prone AHI (4%) 0.00 0.00   Side AHI (4%) 16.47 0.00  Desat Profile <= 90% 0.97m 0.62m   <= 80% 0.109m 0.39m   <= 70% 0.15m 0.73m   <= 60% 0.67m 0.69m  Arousal Index Apnea 0.0 0.0   Hypopnea 1.7 0.0   LM 0.0 0.0   Spontaneous 3.3 17.6   "

## 2024-04-25 ENCOUNTER — Ambulatory Visit (HOSPITAL_COMMUNITY)
Admission: RE | Admit: 2024-04-25 | Discharge: 2024-04-25 | Disposition: A | Discharge status: Home / Self Care | Source: Ambulatory Visit | Attending: Internal Medicine | Admitting: Internal Medicine

## 2024-04-25 DIAGNOSIS — Z952 Presence of prosthetic heart valve: Secondary | ICD-10-CM | POA: Insufficient documentation

## 2024-04-25 LAB — ECHOCARDIOGRAM COMPLETE
AR max vel: 3 cm2
AV Area VTI: 3.27 cm2
AV Area mean vel: 3.1 cm2
AV Mean grad: 7 mmHg
AV Peak grad: 13.2 mmHg
Ao pk vel: 1.81 m/s
Area-P 1/2: 2.48 cm2
MV VTI: 2.49 cm2
S' Lateral: 3.1 cm

## 2024-04-26 ENCOUNTER — Ambulatory Visit: Payer: Self-pay | Admitting: Physician Assistant

## 2024-04-27 ENCOUNTER — Other Ambulatory Visit: Payer: Self-pay | Admitting: Nurse Practitioner

## 2024-04-28 ENCOUNTER — Ambulatory Visit: Attending: Physician Assistant | Admitting: Physician Assistant

## 2024-04-28 VITALS — BP 136/72 | HR 76 | Ht 70.0 in | Wt 301.8 lb

## 2024-04-28 DIAGNOSIS — I4821 Permanent atrial fibrillation: Secondary | ICD-10-CM | POA: Diagnosis not present

## 2024-04-28 DIAGNOSIS — I712 Thoracic aortic aneurysm, without rupture, unspecified: Secondary | ICD-10-CM | POA: Diagnosis not present

## 2024-04-28 DIAGNOSIS — I10 Essential (primary) hypertension: Secondary | ICD-10-CM

## 2024-04-28 DIAGNOSIS — Z952 Presence of prosthetic heart valve: Secondary | ICD-10-CM

## 2024-04-28 DIAGNOSIS — R42 Dizziness and giddiness: Secondary | ICD-10-CM | POA: Diagnosis not present

## 2024-04-28 MED ORDER — AMOXICILLIN 500 MG PO TABS: 2000.0000 mg | ORAL_TABLET | ORAL | 12 refills | Status: AC

## 2024-04-28 NOTE — Progress Notes (Signed)
 " [facility]   MULTIDISCIPLINARY HEART VALVE CLINIC                                     Cardiology Office Note:    Date:  04/28/2024   ID:  Steven [name], DOB [birth year], MRN [id number]  PCP:  Alphonsa Glendia LABOR, MD  [facility] Cardiologist:  [name], MD  [facility] Structural heart: [name], MD [facility] Electrophysiologist:  None   Referring MD: Alphonsa Glendia LABOR, MD   1 month s/p TAVR  History of Present Illness:    Steven [name] is a 84 y.o. male with a hx of CAD, TAA, HTN, HLD, pre diabetes, morbid obesity (BMI 43), permanent atrial fib on Eliquis  and severe paradoxical low-flow low gradient AS s/p TAVR (03/15/24) who presents to clinic for follow up.   He reported dizziness, fatigue and dyspnea. Echo 12/21/23 showed EF 70%, severe MAC with mild MR and pLFLG AS w/ mean grad 29 mmHg, Vmax 3.44 m/s, AVA 0.86 cm2, DVI 0.28, SVI 27. Cath 01/18/24 showed mild distal left main stenosis of 30 to 40%, moderate mid LAD stenosis of 60 to 70%, patent, small left circumflex with patent intermediate branch, large, dominant RCA with mild nonobstructive plaquing and moderate distal bifurcation stenosis involving the distal RCA, PDA, and PLA branches.   1 month s/p TAVR on 04/25/24 showed EF 60%, mild cLVH, severe LAE, severe MAC with mild MR, and normally functioning TAVR with a mean gradient of 7 mmHg and mild PVL.   Today the patient presents to clinic for follow up. Here with his wife.  He was signed up for cardiac rehab at [facility] but has not gone due to being sick and busy.  He also complains of persistent dizziness which preceded TAVR, but continues and interferes with his day-to-day.  He attributes his sedentary lifestyle to his symptoms symptoms of dizziness.  Dizziness is most prominent upon standing from sitting and sometimes with walking.  Since the TAVR procedure his left arm has felt intermittently numb.  There is no pattern to this.  He has no weakness.   No other neurologic deficits.  He denies chest pain, shortness of breath, lower extremity edema, orthopnea or PND.  He denies palpitations.  He denies syncope.  Past Medical History:  Diagnosis Date   Chronic pain    Complication of anesthesia    pt woke up during last surgery    Coronary artery disease 02-27-11   Dr. Fell slain follows-has some blocked coronary arteries ,not suitable for stent  placement   Dyslipidemia    Dysrhythmia    hx of atrial fib during last hospitalization    GERD (gastroesophageal reflux disease) 02-27-11   Acid reflux   Hearing loss 02-27-11   Bilateral hearing aids-due to exposure to loud machinery   Hemorrhoids 02-27-11   not bothersome at this time   Hyperlipidemia    Hypertension    Leg swelling    Obesity    Osteoarthritis 02-27-11   Ostearthritis-knees, shoulders.Back causes chronic pain-radiates down right leg   Rash    Seizures (HCC) 07/2014   Shingles outbreak 02-27-11   2 weeks ago , was tx.-only residual is tenderness of right scalp-no open areas   Shortness of breath    with exertion    Urinary incontinence 02-27-11   not an everday occurrence-no special measures  Current Medications: Active Medications[1]    ROS:   Please see the history of present illness.    All other systems reviewed and are negative.  EKGs       Risk Assessment/Calculations:    CHA2DS2-VASc Score = 5   This indicates a 7.2% annual risk of stroke. The patient's score is based upon: CHF History: 1 HTN History: 1 Diabetes History: 0 Stroke History: 0 Vascular Disease History: 1 Age Score: 2 Gender Score: 0           Physical Exam:    VS:  BP 136/72   Pulse 76   Ht 5' 10 (1.778 m)   Wt (!) 301 lb 12.8 oz (136.9 kg)   SpO2 96%   BMI 43.30 kg/m     Wt Readings from Last 3 Encounters:  04/28/24 (!) 301 lb 12.8 oz (136.9 kg)  03/24/24 295 lb (133.8 kg)  03/16/24 299 lb 6.4 oz (135.8 kg)     GEN: Well nourished, well  developed in no acute distress NECK: No JVD CARDIAC: Irregularly irregular.  2 out of 6 systolic murmur at right upper sternal border.  No rubs, gallops RESPIRATORY:  Clear to auscultation without rales, wheezing or rhonchi  ABDOMEN: Soft, non-tender, non-distended EXTREMITIES:  No edema; No deformity.   ASSESSMENT:    1. S/P TAVR (transcatheter aortic valve replacement)   2. Primary hypertension   3. Thoracic aortic aneurysm without rupture, unspecified part   4. Permanent atrial fibrillation (HCC)   5. Dizziness     PLAN:    In order of problems listed above:  Severe AS s/p TAVR:  -- Echo 04/25/24 showed EF 60%, mild cLVH, severe LAE, severe MAC with mild MR, and normally functioning TAVR with a mean gradient of 7 mmHg and mild PVL.  -- NYHA class II symptoms-although patient is quite sedentary.  --I have strongly encouraged him to participate in cardiac rehab.  His wife will call [facility] tomorrow -- Continue apixaban  5 mg twice daily.  -- SBE prophylaxis discussed; I have RX'd amoxicillin .  -- I will see back for 1 year office visit with echo.  HTN: -- BP well controlled.  -- Continue lisinopril  2.5mg  daily; however, I have asked him to try coming off of this to see if it helps his positional dizziness.   TAA: -- 4.5 cm on pre TAVR CT.  -- Continue to follow over time.    Permanent atrial fibrillation: -- Rate well controlled on no rate controlling meds.  -- Continue Eliquis  5mg  BID.   Dizziness: --This preceded TAVR.  He was hopeful this was due to his aortic stenosis; however, it has continued and sounds orthostatic nature.  -- Pt is on several medications including Flomax , lisinopril , Cymabalta and Zonegran .  -- I suggested wearing compression stockings and standing slowly.  -- He will try stopping lisinopril  2.5mg  daily to see if this helps.   Medication Adjustments/Labs and Tests Ordered: Current medicines are reviewed at length with the patient today.   Concerns regarding medicines are outlined above.  Orders Placed This Encounter  Procedures   ECHOCARDIOGRAM COMPLETE   Meds ordered this encounter  Medications   amoxicillin  (AMOXIL ) 500 MG tablet    Sig: Take 4 tablets (2,000 mg total) by mouth as directed. 1 hour prior to dental work including cleanings    Dispense:  12 tablet    Refill:  12    Supervising Provider:   WONDA SHARPER [[id number]]    Patient Instructions  Medication Instructions:  Your physician has recommended you make the following change in your medication:  START Amoxicillin  500 mg, take 4 tablets by mouth 1 hour prior to dental procedures and cleanings.    STOP Lisinopril . *If you need a refill on your cardiac medications before your next appointment, please call your pharmacy*  Lab Work: None needed If you have labs (blood work) drawn today and your tests are completely normal, you will receive your results only by: MyChart Message (if you have MyChart) OR A paper copy in the mail If you have any lab test that is abnormal or we need to change your treatment, we will call you to review the results.  Testing/Procedures: 03/13/25 Your physician has requested that you have an echocardiogram. Echocardiography is a painless test that uses sound waves to create images of your heart. It provides your doctor with information about the size and shape of your heart and how well your hearts chambers and valves are working. This procedure takes approximately one hour. There are no restrictions for this procedure. Please do NOT wear cologne, perfume, aftershave, or lotions (deodorant is allowed). Please arrive 15 minutes prior to your appointment time.  Please note: We ask at that you not bring children with you during ultrasound (echo/ vascular) testing. Due to room size and safety concerns, children are not allowed in the ultrasound rooms during exams. Our front office staff cannot provide observation of children in our lobby  area while testing is being conducted. An adult accompanying a patient to their appointment will only be allowed in the ultrasound room at the discretion of the ultrasound technician under special circumstances. We apologize for any inconvenience.   Follow-Up: At [facility], you and your health needs are our priority.  As part of our continuing mission to provide you with exceptional heart care, our providers are all part of one team.  This team includes your primary Cardiologist (physician) and Advanced Practice Providers or APPs (Physician Assistants and Nurse Practitioners) who all work together to provide you with the care you need, when you need it.  Your next appointment:   As scheduled on 10/06/24  Provider:   Dr. Wonda  We recommend signing up for the patient portal called MyChart.  Sign up information is provided on this After Visit Summary.  MyChart is used to connect with patients for Virtual Visits (Telemedicine).  Patients are able to view lab/test results, encounter notes, upcoming appointments, etc.  Non-urgent messages can be sent to your provider as well.   To learn more about what you can do with MyChart, go to [email or web address].           Signed, Lamarr Hummer, PA-C  04/28/2024 8:38 PM    [facility]     [1]  Current Meds  Medication Sig   amoxicillin  (AMOXIL ) 500 MG tablet Take 4 tablets (2,000 mg total) by mouth as directed. 1 hour prior to dental work including cleanings   Ascorbic Acid (VITAMIN C) 1000 MG tablet Take 1,000 mg by mouth daily.   atorvastatin  (LIPITOR ) 80 MG tablet Take 1 tablet (80 mg total) by mouth daily.   Cholecalciferol (VITAMIN D) 50 MCG (2000 UT) CAPS Take 2,000 Units by mouth daily.   cyanocobalamin (VITAMIN B12) 1000 MCG tablet Take by mouth daily.   diclofenac Sodium (VOLTAREN) 1 % GEL Apply 2 g topically daily as needed (Knee pain).   DULoxetine  (CYMBALTA ) 60 MG capsule Take 1 capsule (60  mg  total) by mouth daily.   ELIQUIS  5 MG TABS tablet TAKE (1) TABLET BY MOUTH TWICE DAILY.   Omega-3 Fatty Acids (FISH OIL) 1000 MG CAPS Take 1,000 mg by mouth daily.   pantoprazole  (PROTONIX ) 40 MG tablet TAKE ONE TABLET BY MOUTH EVERY DAY.   Polyethyl Glycol-Propyl Glycol (SYSTANE ULTRA) 0.4-0.3 % SOLN Place 1 drop into both eyes daily as needed (Dry eyes).   Probiotic Product (ALIGN) 10 MG CAPS Take 10 mg by mouth every morning.   tamsulosin  (FLOMAX ) 0.4 MG CAPS capsule Take 1 capsule (0.4 mg total) by mouth daily.   zinc gluconate 50 MG tablet Take 50 mg by mouth daily.   zonisamide  (ZONEGRAN ) 100 MG capsule TAKE (2) CAPSULES BY MOUTH ONCE DAILY.   [DISCONTINUED] lisinopril  (ZESTRIL ) 2.5 MG tablet Take 1 tablet (2.5 mg total) by mouth daily.   "

## 2024-04-28 NOTE — Patient Instructions (Addendum)
 Medication Instructions:  Your physician has recommended you make the following change in your medication:  START Amoxicillin  500 mg, take 4 tablets by mouth 1 hour prior to dental procedures and cleanings.    STOP Lisinopril . *If you need a refill on your cardiac medications before your next appointment, please call your pharmacy*  Lab Work: None needed If you have labs (blood work) drawn today and your tests are completely normal, you will receive your results only by: MyChart Message (if you have MyChart) OR A paper copy in the mail If you have any lab test that is abnormal or we need to change your treatment, we will call you to review the results.  Testing/Procedures: 03/13/25 Your physician has requested that you have an echocardiogram. Echocardiography is a painless test that uses sound waves to create images of your heart. It provides your doctor with information about the size and shape of your heart and how well your hearts chambers and valves are working. This procedure takes approximately one hour. There are no restrictions for this procedure. Please do NOT wear cologne, perfume, aftershave, or lotions (deodorant is allowed). Please arrive 15 minutes prior to your appointment time.  Please note: We ask at that you not bring children with you during ultrasound (echo/ vascular) testing. Due to room size and safety concerns, children are not allowed in the ultrasound rooms during exams. Our front office staff cannot provide observation of children in our lobby area while testing is being conducted. An adult accompanying a patient to their appointment will only be allowed in the ultrasound room at the discretion of the ultrasound technician under special circumstances. We apologize for any inconvenience.   Follow-Up: At [facility], you and your health needs are our priority.  As part of our continuing mission to provide you with exceptional heart care, our providers are all  part of one team.  This team includes your primary Cardiologist (physician) and Advanced Practice Providers or APPs (Physician Assistants and Nurse Practitioners) who all work together to provide you with the care you need, when you need it.  Your next appointment:   As scheduled on 10/06/24  Provider:   Dr. Wonda  We recommend signing up for the patient portal called MyChart.  Sign up information is provided on this After Visit Summary.  MyChart is used to connect with patients for Virtual Visits (Telemedicine).  Patients are able to view lab/test results, encounter notes, upcoming appointments, etc.  Non-urgent messages can be sent to your provider as well.   To learn more about what you can do with MyChart, go to [email or web address].

## 2024-05-03 ENCOUNTER — Encounter (HOSPITAL_COMMUNITY)
Admission: RE | Admit: 2024-05-03 | Discharge: 2024-05-03 | Disposition: A | Discharge status: Home / Self Care | Source: Ambulatory Visit | Attending: Cardiovascular Disease | Admitting: Cardiovascular Disease

## 2024-05-03 DIAGNOSIS — Z952 Presence of prosthetic heart valve: Secondary | ICD-10-CM | POA: Insufficient documentation

## 2024-05-03 NOTE — Progress Notes (Signed)
 Completed virtual orientation today.  EP evaluation is scheduled for 1/28 at 2:30 .  Documentation for diagnosis can be found in [facility] encounter 03/15/24.

## 2024-05-11 ENCOUNTER — Encounter (HOSPITAL_COMMUNITY)
Admission: RE | Admit: 2024-05-11 | Discharge: 2024-05-11 | Disposition: A | Source: Ambulatory Visit | Attending: Cardiovascular Disease

## 2024-05-11 VITALS — Ht 70.0 in | Wt 301.6 lb

## 2024-05-11 DIAGNOSIS — Z952 Presence of prosthetic heart valve: Secondary | ICD-10-CM | POA: Diagnosis present

## 2024-05-11 NOTE — Patient Instructions (Signed)
 Patient Instructions  Patient Details  Name: Steven [name] MRN: [id number] Date of Birth: 1940-06-09 Referring Provider:  Wonda Sharper, MD  Below are your personal goals for exercise, nutrition, and risk factors. Our goal is to help you stay on track towards obtaining and maintaining these goals. We will be discussing your progress on these goals with you throughout the program.  Initial Exercise Prescription:  Initial Exercise Prescription - 05/11/24 1400       Date of Initial Exercise RX and Referring Provider   Date 05/11/24    Referring Provider cooper, Michael MD      NuStep   Level 1    SPM 50    Minutes 15    METs 1.8      Arm Ergometer   Level 1    RPM 30    Minutes 15    METs 1.8      Prescription Details   Duration Progress to 30 minutes of continuous aerobic without signs/symptoms of physical distress      Intensity   THRR 40-80% of Max Heartrate 108-128    Ratings of Perceived Exertion 11-13    Perceived Dyspnea 0-4      Resistance Training   Training Prescription Yes    Weight 4    Reps 10-15          Exercise Goals: Frequency: Be able to perform aerobic exercise two to three times per week in program working toward 2-5 days per week of home exercise.  Intensity: Work with a perceived exertion of 11 (fairly light) - 15 (hard) while following your exercise prescription.  We will make changes to your prescription with you as you progress through the program.   Duration: Be able to do 30 to 45 minutes of continuous aerobic exercise in addition to a 5 minute warm-up and a 5 minute cool-down routine.   Nutrition Goals: Your personal nutrition goals will be established when you do your nutrition analysis with the dietician.  The following are general nutrition guidelines to follow: Cholesterol < 200mg /day Sodium < 1500mg /day Fiber: Men over 50 yrs - 30 grams per day  Personal Goals:  Personal Goals and Risk Factors at Admission - 05/03/24 1253        Core Components/Risk Factors/Patient Goals on Admission    Weight Management Obesity    Hypertension Yes    Intervention Provide education on lifestyle modifcations including regular physical activity/exercise, weight management, moderate sodium restriction and increased consumption of fresh fruit, vegetables, and low fat dairy, alcohol moderation, and smoking cessation.;Monitor prescription use compliance.    Expected Outcomes Long Term: Maintenance of blood pressure at goal levels.;Short Term: Continued assessment and intervention until BP is < 140/10mm HG in hypertensive participants. < 130/63mm HG in hypertensive participants with diabetes, heart failure or chronic kidney disease.    Lipids Yes    Intervention Provide education and support for participant on nutrition & aerobic/resistive exercise along with prescribed medications to achieve LDL 70mg , HDL >40mg .    Expected Outcomes Short Term: Participant states understanding of desired cholesterol values and is compliant with medications prescribed. Participant is following exercise prescription and nutrition guidelines.;Long Term: Cholesterol controlled with medications as prescribed, with individualized exercise RX and with personalized nutrition plan. Value goals: LDL < 70mg , HDL > 40 mg.          Tobacco Use Initial Evaluation: Social History   Tobacco Use  Smoking Status Former  Smokeless Tobacco Former   Types: Doctor, Hospital  date: 01/27/1973  Tobacco Comments   chewing     Exercise Goals and Review:  Exercise Goals     Row Name 05/03/24 1252             Exercise Goals   Increase Physical Activity Yes       Intervention Provide advice, education, support and counseling about physical activity/exercise needs.;Develop an individualized exercise prescription for aerobic and resistive training based on initial evaluation findings, risk stratification, comorbidities and participant's personal goals.       Expected  Outcomes Short Term: Attend rehab on a regular basis to increase amount of physical activity.;Long Term: Add in home exercise to make exercise part of routine and to increase amount of physical activity.;Long Term: Exercising regularly at least 3-5 days a week.       Increase Strength and Stamina Yes       Intervention Provide advice, education, support and counseling about physical activity/exercise needs.;Develop an individualized exercise prescription for aerobic and resistive training based on initial evaluation findings, risk stratification, comorbidities and participant's personal goals.       Expected Outcomes Short Term: Increase workloads from initial exercise prescription for resistance, speed, and METs.;Short Term: Perform resistance training exercises routinely during rehab and add in resistance training at home;Long Term: Improve cardiorespiratory fitness, muscular endurance and strength as measured by increased METs and functional capacity ( )       Able to understand and use rate of perceived exertion (RPE) scale Yes       Intervention Provide education and explanation on how to use RPE scale       Expected Outcomes Long Term:  Able to use RPE to guide intensity level when exercising independently;Short Term: Able to use RPE daily in rehab to express subjective intensity level       Able to understand and use Dyspnea scale Yes       Intervention Provide education and explanation on how to use Dyspnea scale       Expected Outcomes Long Term: Able to use Dyspnea scale to guide intensity level when exercising independently;Short Term: Able to use Dyspnea scale daily in rehab to express subjective sense of shortness of breath during exertion       Knowledge and understanding of Target Heart Rate Range (THRR) Yes       Intervention Provide education and explanation of THRR including how the numbers were predicted and where they are located for reference       Expected Outcomes Short Term:  Able to state/look up THRR;Long Term: Able to use THRR to govern intensity when exercising independently;Short Term: Able to use daily as guideline for intensity in rehab       Able to check pulse independently Yes       Intervention Review the importance of being able to check your own pulse for safety during independent exercise;Provide education and demonstration on how to check pulse in carotid and radial arteries.       Expected Outcomes Short Term: Able to explain why pulse checking is important during independent exercise;Long Term: Able to check pulse independently and accurately       Understanding of Exercise Prescription Yes       Intervention Provide education, explanation, and written materials on patient's individual exercise prescription       Expected Outcomes Short Term: Able to explain program exercise prescription;Long Term: Able to explain home exercise prescription to exercise independently  Copy of goals given to participant.

## 2024-05-11 NOTE — Progress Notes (Signed)
 Cardiac Individual Treatment Plan  Patient Details  Name: Steven [name] MRN: [id number] Date of Birth: Oct 06, 1940 Referring Provider:   Flowsheet Row CARDIAC REHAB PHASE II ORIENTATION from 05/11/2024 in [facility] CARDIAC REHABILITATION  Referring Provider cooper, Ozell MD    Initial Encounter Date:  Flowsheet Row CARDIAC REHAB PHASE II ORIENTATION from 05/11/2024 in [place] CARDIAC REHABILITATION  Date 05/11/24    Visit Diagnosis: S/P TAVR (transcatheter aortic valve replacement)  Patient's Home Medications on Admission: Current Medications[1]  Past Medical History: Past Medical History:  Diagnosis Date   Chronic pain    Complication of anesthesia    pt woke up during last surgery    Coronary artery disease 02-27-11   Dr. Wonda slain follows-has some blocked coronary arteries ,not suitable for stent  placement   Dyslipidemia    Dysrhythmia    hx of atrial fib during last hospitalization    GERD (gastroesophageal reflux disease) 02-27-11   Acid reflux   Hearing loss 02-27-11   Bilateral hearing aids-due to exposure to loud machinery   Hemorrhoids 02-27-11   not bothersome at this time   Hyperlipidemia    Hypertension    Leg swelling    Obesity    Osteoarthritis 02-27-11   Ostearthritis-knees, shoulders.Back causes chronic pain-radiates down right leg   Rash    Seizures (HCC) 07/2014   Shingles outbreak 02-27-11   2 weeks ago , was tx.-only residual is tenderness of right scalp-no open areas   Shortness of breath    with exertion    Urinary incontinence 02-27-11   not an everday occurrence-no special measures    Tobacco Use: Tobacco Use History[2]  Labs: Review Flowsheet  More data exists      Latest Ref Rng & Units 12/23/2021 06/23/2022 11/30/2023 01/18/2024 03/15/2024  Labs for ITP Cardiac and Pulmonary Rehab  Cholestrol 100 - 199 mg/dL 884  874  873  - -  LDL (calc) 0 - 99 mg/dL 64  64  69  - -  HDL-C >39 mg/dL 40  47  40  - -  Trlycerides 0 -  149 mg/dL 43  65  88  - -  Hemoglobin A1c 4.8 - 5.6 % 6.1  6.0  6.0  - -  PH, Arterial 7.35 - 7.45 - - - 7.332  -  PCO2 arterial 32 - 48 mmHg - - - 39.1  -  Bicarbonate 20.0 - 28.0 mmol/L 20.0 - 28.0 mmol/L - - - 20.7  21.0  -  TCO2 22 - 32 mmol/L - - - 22  22  20    Acid-base deficit 0.0 - 2.0 mmol/L 0.0 - 2.0 mmol/L - - - 5.0  6.0  -  O2 Saturation % % - - - 96  67  -    Details       Multiple values from one day are sorted in reverse-chronological order          Exercise Target Goals: Exercise Program Goal: Individual exercise prescription set using results from initial 6 min walk test and THRR while considering  patients activity barriers and safety.   Exercise Prescription Goal: Initial exercise prescription builds to 30-45 minutes a day of aerobic activity, 2-3 days per week.  Home exercise guidelines will be given to patient during program as part of exercise prescription that the participant will acknowledge.   Education: Aerobic Exercise: - Group verbal and visual presentation on the components of exercise prescription. Introduces F.I.T.T principle from ACSM for exercise prescriptions.  Reviews F.I.T.T. principles of aerobic exercise including progression. Written material provided at class time.   Education: Resistance Exercise: - Group verbal and visual presentation on the components of exercise prescription. Introduces F.I.T.T principle from ACSM for exercise prescriptions  Reviews F.I.T.T. principles of resistance exercise including progression. Written material provided at class time.    Education: Exercise & Equipment Safety: - Individual verbal instruction and demonstration of equipment use and safety with use of the equipment.   Education: Exercise Physiology & General Exercise Guidelines: - Group verbal and written instruction with models to review the exercise physiology of the cardiovascular system and associated critical values. Provides general  exercise guidelines with specific guidelines to those with heart or lung disease. Written material provided at class time.   Education: Flexibility, Balance, Mind/Body Relaxation: - Group verbal and visual presentation with interactive activity on the components of exercise prescription. Introduces F.I.T.T principle from ACSM for exercise prescriptions. Reviews F.I.T.T. principles of flexibility and balance exercise training including progression. Also discusses the mind body connection.  Reviews various relaxation techniques to help reduce and manage stress (i.e. Deep breathing, progressive muscle relaxation, and visualization). Balance handout provided to take home. Written material provided at class time.   Activity Barriers & Risk Stratification:  Activity Barriers & Cardiac Risk Stratification - 05/03/24 1246       Activity Barriers & Cardiac Risk Stratification   Activity Barriers Deconditioning;Muscular Weakness;Left Knee Replacement;Right Knee Replacement;Back Problems;Arthritis    Cardiac Risk Stratification Moderate          6 Minute Walk:  6 Minute Walk     Row Name 05/11/24 1436         6 Minute Walk   Phase Initial     Distance 785 feet     Walk Time 4 minutes     # of Rest Breaks 0     MPH 2.23     METS 0.63     RPE 13     VO2 Peak 2.21     Symptoms Yes (comment)     Comments Dizzy     Resting HR 88 bpm     Resting BP 122/70     Resting Oxygen Saturation  95 %     Exercise Oxygen Saturation  during 6 min walk 97 %     Max Ex. HR 114 bpm     Max Ex. BP 142/70     2 Minute Post BP 126/70        Oxygen Initial Assessment:   Oxygen Re-Evaluation:   Oxygen Discharge (Final Oxygen Re-Evaluation):   Initial Exercise Prescription:  Initial Exercise Prescription - 05/11/24 1400       Date of Initial Exercise RX and Referring Provider   Date 05/11/24    Referring Provider cooper, Michael MD      NuStep   Level 1    SPM 50    Minutes 15    METs  1.8      Arm Ergometer   Level 1    RPM 30    Minutes 15    METs 1.8      Prescription Details   Frequency (times per week) 2    Duration Progress to 30 minutes of continuous aerobic without signs/symptoms of physical distress      Intensity   THRR 40-80% of Max Heartrate 108-128    Ratings of Perceived Exertion 11-13    Perceived Dyspnea 0-4      Resistance Training   Training Prescription  Yes    Weight 4    Reps 10-15          Perform Capillary Blood Glucose checks as needed.  Exercise Prescription Changes:   Exercise Prescription Changes     Row Name 05/11/24 1400             Response to Exercise   Blood Pressure (Admit) 122/70       Blood Pressure (Exercise) 142/70       Blood Pressure (Exit) 126/70       Heart Rate (Admit) 88 bpm       Heart Rate (Exercise) 114 bpm       Heart Rate (Exit) 87 bpm       Oxygen Saturation (Admit) 95 %       Oxygen Saturation (Exercise) 97 %       Oxygen Saturation (Exit) 97 %       Rating of Perceived Exertion (Exercise) 13       Symptoms Dizzy          Exercise Comments:   Exercise Comments     Row Name 05/03/24 1252           Exercise Comments Pt walks some at home when it is nice outside.          Exercise Goals and Review:   Exercise Goals     Row Name 05/03/24 1252             Exercise Goals   Increase Physical Activity Yes       Intervention Provide advice, education, support and counseling about physical activity/exercise needs.;Develop an individualized exercise prescription for aerobic and resistive training based on initial evaluation findings, risk stratification, comorbidities and participant's personal goals.       Expected Outcomes Short Term: Attend rehab on a regular basis to increase amount of physical activity.;Long Term: Add in home exercise to make exercise part of routine and to increase amount of physical activity.;Long Term: Exercising regularly at least 3-5 days a week.        Increase Strength and Stamina Yes       Intervention Provide advice, education, support and counseling about physical activity/exercise needs.;Develop an individualized exercise prescription for aerobic and resistive training based on initial evaluation findings, risk stratification, comorbidities and participant's personal goals.       Expected Outcomes Short Term: Increase workloads from initial exercise prescription for resistance, speed, and METs.;Short Term: Perform resistance training exercises routinely during rehab and add in resistance training at home;Long Term: Improve cardiorespiratory fitness, muscular endurance and strength as measured by increased METs and functional capacity ( )       Able to understand and use rate of perceived exertion (RPE) scale Yes       Intervention Provide education and explanation on how to use RPE scale       Expected Outcomes Long Term:  Able to use RPE to guide intensity level when exercising independently;Short Term: Able to use RPE daily in rehab to express subjective intensity level       Able to understand and use Dyspnea scale Yes       Intervention Provide education and explanation on how to use Dyspnea scale       Expected Outcomes Long Term: Able to use Dyspnea scale to guide intensity level when exercising independently;Short Term: Able to use Dyspnea scale daily in rehab to express subjective sense of shortness of breath during exertion       Knowledge  and understanding of Target Heart Rate Range (THRR) Yes       Intervention Provide education and explanation of THRR including how the numbers were predicted and where they are located for reference       Expected Outcomes Short Term: Able to state/look up THRR;Long Term: Able to use THRR to govern intensity when exercising independently;Short Term: Able to use daily as guideline for intensity in rehab       Able to check pulse independently Yes       Intervention Review the importance of being able  to check your own pulse for safety during independent exercise;Provide education and demonstration on how to check pulse in carotid and radial arteries.       Expected Outcomes Short Term: Able to explain why pulse checking is important during independent exercise;Long Term: Able to check pulse independently and accurately       Understanding of Exercise Prescription Yes       Intervention Provide education, explanation, and written materials on patient's individual exercise prescription       Expected Outcomes Short Term: Able to explain program exercise prescription;Long Term: Able to explain home exercise prescription to exercise independently          Exercise Goals Re-Evaluation :   Discharge Exercise Prescription (Final Exercise Prescription Changes):  Exercise Prescription Changes - 05/11/24 1400       Response to Exercise   Blood Pressure (Admit) 122/70    Blood Pressure (Exercise) 142/70    Blood Pressure (Exit) 126/70    Heart Rate (Admit) 88 bpm    Heart Rate (Exercise) 114 bpm    Heart Rate (Exit) 87 bpm    Oxygen Saturation (Admit) 95 %    Oxygen Saturation (Exercise) 97 %    Oxygen Saturation (Exit) 97 %    Rating of Perceived Exertion (Exercise) 13    Symptoms Dizzy          Nutrition:  Target Goals: Understanding of nutrition guidelines, daily intake of sodium 1500mg , cholesterol 200mg , calories 30% from fat and 7% or less from saturated fats, daily to have 5 or more servings of fruits and vegetables.  Education: Nutrition 1 -Group instruction provided by verbal, written material, interactive activities, discussions, models, and posters to present general guidelines for heart healthy nutrition including macronutrients, label reading, and promoting whole foods over processed counterparts. Education serves as pensions consultant of discussion of heart healthy eating for all. Written material provided at class time.    Education: Nutrition 2 -Group instruction provided  by verbal, written material, interactive activities, discussions, models, and posters to present general guidelines for heart healthy nutrition including sodium, cholesterol, and saturated fat. Providing guidance of habit forming to improve blood pressure, cholesterol, and body weight. Written material provided at class time.     Biometrics:  Pre Biometrics - 05/11/24 1434       Pre Biometrics   Height 5' 10 (1.778 m)    Weight 136.8 kg    Waist Circumference 49 inches    Hip Circumference 52 inches    Waist to Hip Ratio 0.94 %    BMI (Calculated) 43.27    Grip Strength 13.6 kg           Nutrition Therapy Plan and Nutrition Goals:   Nutrition Assessments:  MEDIFICTS Score Key: >=70 Need to make dietary changes  40-70 Heart Healthy Diet <= 40 Therapeutic Level Cholesterol Diet  Flowsheet Row CARDIAC REHAB PHASE II ORIENTATION from 05/11/2024 in [place]  CARDIAC REHABILITATION  Picture Your Plate Total Score on Admission 50   Picture Your Plate Scores: <59 Unhealthy dietary pattern with much room for improvement. 41-50 Dietary pattern unlikely to meet recommendations for good health and room for improvement. 51-60 More healthful dietary pattern, with some room for improvement.  >60 Healthy dietary pattern, although there may be some specific behaviors that could be improved.    Nutrition Goals Re-Evaluation:   Nutrition Goals Discharge (Final Nutrition Goals Re-Evaluation):   Psychosocial: Target Goals: Acknowledge presence or absence of significant depression and/or stress, maximize coping skills, provide positive support system. Participant is able to verbalize types and ability to use techniques and skills needed for reducing stress and depression.   Education: Stress, Anxiety, and Depression - Group verbal and visual presentation to define topics covered.  Reviews how body is impacted by stress, anxiety, and depression.  Also discusses healthy ways to reduce  stress and to treat/manage anxiety and depression. Written material provided at class time.   Education: Sleep Hygiene -Provides group verbal and written instruction about how sleep can affect your health.  Define sleep hygiene, discuss sleep cycles and impact of sleep habits. Review good sleep hygiene tips.   Initial Review & Psychosocial Screening:  Initial Psych Review & Screening - 05/03/24 1254       Initial Review   Current issues with None Identified      Family Dynamics   Good Support System? Yes    Comments Spouse Bruna is very involved with his care.      Barriers   Psychosocial barriers to participate in program There are no identifiable barriers or psychosocial needs.      Screening Interventions   Interventions Encouraged to exercise    Expected Outcomes Short Term goal: Utilizing psychosocial counselor, staff and physician to assist with identification of specific Stressors or current issues interfering with healing process. Setting desired goal for each stressor or current issue identified.;Long Term Goal: Stressors or current issues are controlled or eliminated.;Short Term goal: Identification and review with participant of any Quality of Life or Depression concerns found by scoring the questionnaire.;Long Term goal: The participant improves quality of Life and PHQ9 Scores as seen by post scores and/or verbalization of changes          Quality of Life Scores:   Quality of Life - 05/11/24 1441       Quality of Life   Select Quality of Life      Quality of Life Scores   Health/Function Pre 16.27 %    Socioeconomic Pre 24 %    Psych/Spiritual Pre 24 %    Family Pre 21.6 %    GLOBAL Pre 20.87 %         Scores of 19 and below usually indicate a poorer quality of life in these areas.  A difference of  2-3 points is a clinically meaningful difference.  A difference of 2-3 points in the total score of the Quality of Life Index has been associated with significant  improvement in overall quality of life, self-image, physical symptoms, and general health in studies assessing change in quality of life.  PHQ-9: Review Flowsheet  More data exists      05/11/2024 12/02/2023 05/15/2023 03/20/2023 12/29/2022  Depression screen PHQ 2/9  Decreased Interest 2 0 0 0 2  Down, Depressed, Hopeless 1 0 1 0 0  PHQ - 2 Score 3 0 1 0 2  Altered sleeping 3 0 0 0 2  Tired,  decreased energy 3 0 0 1 3  Change in appetite 3 0 0 0 3  Feeling bad or failure about yourself  1 0 0 0 0  Trouble concentrating 1 0 0 0 0  Moving slowly or fidgety/restless 1 0 0 0 0  Suicidal thoughts 0 0 0 0 0  PHQ-9 Score 15 0  1  1  10    Difficult doing work/chores - - Not difficult at all Not difficult at all Not difficult at all    Details       Data saved with a previous flowsheet row definition        Interpretation of Total Score  Total Score Depression Severity:  1-4 = Minimal depression, 5-9 = Mild depression, 10-14 = Moderate depression, 15-19 = Moderately severe depression, 20-27 = Severe depression   Psychosocial Evaluation and Intervention:  Psychosocial Evaluation - 05/11/24 1427       Psychosocial Evaluation & Interventions   Interventions Encouraged to exercise with the program and follow exercise prescription;Stress management education;Relaxation education    Comments Patient scored 15 on his initial PHQ-9. Patient's wife says she thinks he is depressed at times. He is on cymbalta . She thinks it is due to not being able to do the things he used to do. His goals for the program is lose some weight; improve his mobility and balance and dizziness.    Expected Outcomes Short Term: Patient will start the program and attend consistently. Long Term: Patient will complete the program meeting personal goals.    Continue Psychosocial Services  Follow up required by staff          Psychosocial Re-Evaluation:   Psychosocial Discharge (Final Psychosocial  Re-Evaluation):   Vocational Rehabilitation: Provide vocational rehab assistance to qualifying candidates.   Vocational Rehab Evaluation & Intervention:  Vocational Rehab - 05/03/24 1253       Initial Vocational Rehab Evaluation & Intervention   Assessment shows need for Vocational Rehabilitation No          Education: Education Goals: Education classes will be provided on a variety of topics geared toward better understanding of heart health and risk factor modification. Participant will state understanding/return demonstration of topics presented as noted by education test scores.  Learning Barriers/Preferences:  Learning Barriers/Preferences - 05/03/24 1254       Learning Barriers/Preferences   Learning Barriers Hearing   HOH, Wears hearing aids.   Learning Preferences None          General Cardiac Education Topics:  AED/CPR: - Group verbal and written instruction with the use of models to demonstrate the basic use of the AED with the basic ABC's of resuscitation.   Test and Procedures: - Group verbal and visual presentation and models provide information about basic cardiac anatomy and function. Reviews the testing methods done to diagnose heart disease and the outcomes of the test results. Describes the treatment choices: Medical Management, Angioplasty, or Coronary Bypass Surgery for treating various heart conditions including Myocardial Infarction, Angina, Valve Disease, and Cardiac Arrhythmias. Written material provided at class time.   Medication Safety: - Group verbal and visual instruction to review commonly prescribed medications for heart and lung disease. Reviews the medication, class of the drug, and side effects. Includes the steps to properly store meds and maintain the prescription regimen. Written material provided at class time.   Intimacy: - Group verbal instruction through game format to discuss how heart and lung disease can affect sexual  intimacy. Written material provided at  class time.   Know Your Numbers and Heart Failure: - Group verbal and visual instruction to discuss disease risk factors for cardiac and pulmonary disease and treatment options.  Reviews associated critical values for Overweight/Obesity, Hypertension, Cholesterol, and Diabetes.  Discusses basics of heart failure: signs/symptoms and treatments.  Introduces Heart Failure Zone chart for action plan for heart failure. Written material provided at class time.   Infection Prevention: - Provides verbal and written material to individual with discussion of infection control including proper hand washing and proper equipment cleaning during exercise session.   Falls Prevention: - Provides verbal and written material to individual with discussion of falls prevention and safety.   Other: -Provides group and verbal instruction on various topics (see comments)   Knowledge Questionnaire Score:  Knowledge Questionnaire Score - 05/11/24 1501       Knowledge Questionnaire Score   Pre Score 25/26          Core Components/Risk Factors/Patient Goals at Admission:  Personal Goals and Risk Factors at Admission - 05/03/24 1253       Core Components/Risk Factors/Patient Goals on Admission    Weight Management Obesity    Hypertension Yes    Intervention Provide education on lifestyle modifcations including regular physical activity/exercise, weight management, moderate sodium restriction and increased consumption of fresh fruit, vegetables, and low fat dairy, alcohol moderation, and smoking cessation.;Monitor prescription use compliance.    Expected Outcomes Long Term: Maintenance of blood pressure at goal levels.;Short Term: Continued assessment and intervention until BP is < 140/20mm HG in hypertensive participants. < 130/33mm HG in hypertensive participants with diabetes, heart failure or chronic kidney disease.    Lipids Yes    Intervention Provide education  and support for participant on nutrition & aerobic/resistive exercise along with prescribed medications to achieve LDL 70mg , HDL >40mg .    Expected Outcomes Short Term: Participant states understanding of desired cholesterol values and is compliant with medications prescribed. Participant is following exercise prescription and nutrition guidelines.;Long Term: Cholesterol controlled with medications as prescribed, with individualized exercise RX and with personalized nutrition plan. Value goals: LDL < 70mg , HDL > 40 mg.          Education:Diabetes - Individual verbal and written instruction to review signs/symptoms of diabetes, desired ranges of glucose level fasting, after meals and with exercise. Acknowledge that pre and post exercise glucose checks will be done for 3 sessions at entry of program.   Core Components/Risk Factors/Patient Goals Review:    Core Components/Risk Factors/Patient Goals at Discharge (Final Review):    ITP Comments:  ITP Comments     Row Name 05/03/24 1302           ITP Comments Completed virtual orientation today.  EP evaluation is scheduled for 1/28 at 2:30 .  Documentation for diagnosis can be found in [facility] encounter 03/15/24.          Comments: Patient arrived for 1st visit/orientation/education at 1400. Patient was referred to CR by [name] due to S/P TAVR. During orientation advised patient on arrival and appointment times what to wear, what to do before, during and after exercise. Reviewed attendance and class policy.  Pt is scheduled to return Cardiac Rehab on 05/23/24 at 1100. Pt was advised to come to class 15 minutes before class starts.  Discussed RPE/Dpysnea scales. Patient participated in warm up stretches. Patient was able to complete 6 minute walk test.  Telemetry:NSR with a few PVC's. Patient was measured for the equipment. Discussed equipment safety with patient.  Took patient pre-anthropometric measurements. Patient finished visit at 1500.       [1]  Current Outpatient Medications:    ALPRAZolam  (XANAX ) 0.5 MG tablet, Take 0.5 mg by mouth at bedtime as needed for anxiety. (Patient not taking: Reported on 04/28/2024), Disp: , Rfl:    amoxicillin  (AMOXIL ) 500 MG tablet, Take 4 tablets (2,000 mg total) by mouth as directed. 1 hour prior to dental work including cleanings, Disp: 12 tablet, Rfl: 12   Ascorbic Acid (VITAMIN C) 1000 MG tablet, Take 1,000 mg by mouth daily., Disp: , Rfl:    atorvastatin  (LIPITOR ) 80 MG tablet, Take 1 tablet (80 mg total) by mouth daily., Disp: 90 tablet, Rfl: 3   Cholecalciferol (VITAMIN D) 50 MCG (2000 UT) CAPS, Take 2,000 Units by mouth daily., Disp: , Rfl:    cyanocobalamin (VITAMIN B12) 1000 MCG tablet, Take by mouth daily., Disp: , Rfl:    diclofenac Sodium (VOLTAREN) 1 % GEL, Apply 2 g topically daily as needed (Knee pain)., Disp: , Rfl:    DULoxetine  (CYMBALTA ) 60 MG capsule, Take 1 capsule (60 mg total) by mouth daily., Disp: 90 capsule, Rfl: 1   ELIQUIS  5 MG TABS tablet, TAKE (1) TABLET BY MOUTH TWICE DAILY., Disp: 60 tablet, Rfl: 5   Omega-3 Fatty Acids (FISH OIL) 1000 MG CAPS, Take 1,000 mg by mouth daily., Disp: , Rfl:    pantoprazole  (PROTONIX ) 40 MG tablet, TAKE ONE TABLET BY MOUTH EVERY DAY., Disp: 90 tablet, Rfl: 0   Polyethyl Glycol-Propyl Glycol (SYSTANE ULTRA) 0.4-0.3 % SOLN, Place 1 drop into both eyes daily as needed (Dry eyes)., Disp: , Rfl:    Probiotic Product (ALIGN) 10 MG CAPS, Take 10 mg by mouth every morning., Disp: , Rfl:    tamsulosin  (FLOMAX ) 0.4 MG CAPS capsule, Take 1 capsule (0.4 mg total) by mouth daily., Disp: 90 capsule, Rfl: 3   zinc gluconate 50 MG tablet, Take 50 mg by mouth daily., Disp: , Rfl:    zonisamide  (ZONEGRAN ) 100 MG capsule, TAKE (2) CAPSULES BY MOUTH ONCE DAILY., Disp: 180 capsule, Rfl: 4 [2]  Social History Tobacco Use  Smoking Status Former  Smokeless Tobacco Former   Types: Chew   Quit date: 01/27/1973  Tobacco Comments   chewing

## 2024-05-20 ENCOUNTER — Ambulatory Visit: Payer: PPO

## 2024-05-23 ENCOUNTER — Encounter (HOSPITAL_COMMUNITY)

## 2024-05-26 ENCOUNTER — Encounter (HOSPITAL_COMMUNITY)

## 2024-05-27 ENCOUNTER — Encounter (HOSPITAL_COMMUNITY)

## 2024-05-30 ENCOUNTER — Encounter (HOSPITAL_COMMUNITY)

## 2024-06-02 ENCOUNTER — Encounter (HOSPITAL_COMMUNITY)

## 2024-06-03 ENCOUNTER — Ambulatory Visit: Admitting: Family Medicine

## 2024-06-03 ENCOUNTER — Encounter (HOSPITAL_COMMUNITY)

## 2024-06-06 ENCOUNTER — Encounter (HOSPITAL_COMMUNITY)

## 2024-06-09 ENCOUNTER — Encounter (HOSPITAL_COMMUNITY)

## 2024-06-10 ENCOUNTER — Encounter (HOSPITAL_COMMUNITY)

## 2024-06-13 ENCOUNTER — Encounter (HOSPITAL_COMMUNITY)

## 2024-06-16 ENCOUNTER — Encounter (HOSPITAL_COMMUNITY)

## 2024-06-17 ENCOUNTER — Encounter (HOSPITAL_COMMUNITY)

## 2024-06-20 ENCOUNTER — Encounter (HOSPITAL_COMMUNITY)

## 2024-06-23 ENCOUNTER — Encounter (HOSPITAL_COMMUNITY)

## 2024-06-24 ENCOUNTER — Encounter (HOSPITAL_COMMUNITY)

## 2024-06-27 ENCOUNTER — Encounter (HOSPITAL_COMMUNITY)

## 2024-06-30 ENCOUNTER — Encounter (HOSPITAL_COMMUNITY)

## 2024-07-01 ENCOUNTER — Encounter (HOSPITAL_COMMUNITY)

## 2024-07-04 ENCOUNTER — Encounter (HOSPITAL_COMMUNITY)

## 2024-07-07 ENCOUNTER — Encounter: Admitting: Neurology

## 2024-07-07 ENCOUNTER — Encounter (HOSPITAL_COMMUNITY)

## 2024-07-08 ENCOUNTER — Encounter (HOSPITAL_COMMUNITY)

## 2024-07-11 ENCOUNTER — Encounter (HOSPITAL_COMMUNITY)

## 2024-07-14 ENCOUNTER — Encounter (HOSPITAL_COMMUNITY)

## 2024-07-15 ENCOUNTER — Encounter (HOSPITAL_COMMUNITY)

## 2024-07-18 ENCOUNTER — Encounter (HOSPITAL_COMMUNITY)

## 2024-07-21 ENCOUNTER — Encounter (HOSPITAL_COMMUNITY)

## 2024-07-22 ENCOUNTER — Encounter (HOSPITAL_COMMUNITY)

## 2024-10-06 ENCOUNTER — Ambulatory Visit: Admitting: Cardiovascular Disease

## 2025-03-13 ENCOUNTER — Ambulatory Visit: Admitting: Physician Assistant

## 2025-03-13 ENCOUNTER — Ambulatory Visit (HOSPITAL_COMMUNITY)
